# Patient Record
Sex: Female | Born: 1965 | Race: White | Hispanic: No | Marital: Married | Smoking: Never smoker
Health system: Southern US, Community
[De-identification: ages and names within clinical notes are randomized; demographics above are authoritative.]

## PROBLEM LIST (undated history)

## (undated) DIAGNOSIS — D649 Anemia, unspecified: Secondary | ICD-10-CM

## (undated) DIAGNOSIS — Z8719 Personal history of other diseases of the digestive system: Secondary | ICD-10-CM

## (undated) DIAGNOSIS — E119 Type 2 diabetes mellitus without complications: Secondary | ICD-10-CM

## (undated) DIAGNOSIS — K5790 Diverticulosis of intestine, part unspecified, without perforation or abscess without bleeding: Secondary | ICD-10-CM

## (undated) DIAGNOSIS — K5900 Constipation, unspecified: Secondary | ICD-10-CM

## (undated) DIAGNOSIS — K589 Irritable bowel syndrome without diarrhea: Secondary | ICD-10-CM

## (undated) DIAGNOSIS — T7840XA Allergy, unspecified, initial encounter: Secondary | ICD-10-CM

## (undated) DIAGNOSIS — M2550 Pain in unspecified joint: Secondary | ICD-10-CM

## (undated) DIAGNOSIS — R7303 Prediabetes: Secondary | ICD-10-CM

## (undated) DIAGNOSIS — E785 Hyperlipidemia, unspecified: Secondary | ICD-10-CM

## (undated) DIAGNOSIS — F419 Anxiety disorder, unspecified: Secondary | ICD-10-CM

## (undated) DIAGNOSIS — G4730 Sleep apnea, unspecified: Secondary | ICD-10-CM

## (undated) DIAGNOSIS — K5730 Diverticulosis of large intestine without perforation or abscess without bleeding: Secondary | ICD-10-CM

## (undated) DIAGNOSIS — F329 Major depressive disorder, single episode, unspecified: Secondary | ICD-10-CM

## (undated) DIAGNOSIS — Z973 Presence of spectacles and contact lenses: Secondary | ICD-10-CM

## (undated) DIAGNOSIS — Z9289 Personal history of other medical treatment: Secondary | ICD-10-CM

## (undated) DIAGNOSIS — R112 Nausea with vomiting, unspecified: Secondary | ICD-10-CM

## (undated) DIAGNOSIS — R12 Heartburn: Secondary | ICD-10-CM

## (undated) DIAGNOSIS — R21 Rash and other nonspecific skin eruption: Secondary | ICD-10-CM

## (undated) DIAGNOSIS — K644 Residual hemorrhoidal skin tags: Secondary | ICD-10-CM

## (undated) DIAGNOSIS — K219 Gastro-esophageal reflux disease without esophagitis: Secondary | ICD-10-CM

## (undated) DIAGNOSIS — R002 Palpitations: Secondary | ICD-10-CM

## (undated) DIAGNOSIS — Z9889 Other specified postprocedural states: Secondary | ICD-10-CM

## (undated) DIAGNOSIS — I10 Essential (primary) hypertension: Secondary | ICD-10-CM

## (undated) DIAGNOSIS — I4891 Unspecified atrial fibrillation: Secondary | ICD-10-CM

## (undated) DIAGNOSIS — E559 Vitamin D deficiency, unspecified: Secondary | ICD-10-CM

## (undated) DIAGNOSIS — G2581 Restless legs syndrome: Secondary | ICD-10-CM

## (undated) DIAGNOSIS — Z8759 Personal history of other complications of pregnancy, childbirth and the puerperium: Secondary | ICD-10-CM

## (undated) DIAGNOSIS — F32A Depression, unspecified: Secondary | ICD-10-CM

## (undated) HISTORY — DX: Anemia, unspecified: D64.9

## (undated) HISTORY — DX: Sleep apnea, unspecified: G47.30

## (undated) HISTORY — DX: Unspecified atrial fibrillation: I48.91

## (undated) HISTORY — DX: Hyperlipidemia, unspecified: E78.5

## (undated) HISTORY — DX: Prediabetes: R73.03

## (undated) HISTORY — PX: SHOULDER SURGERY: SHX246

## (undated) HISTORY — DX: Depression, unspecified: F32.A

## (undated) HISTORY — DX: Allergy, unspecified, initial encounter: T78.40XA

## (undated) HISTORY — DX: Palpitations: R00.2

## (undated) HISTORY — DX: Essential (primary) hypertension: I10

## (undated) HISTORY — DX: Restless legs syndrome: G25.81

## (undated) HISTORY — DX: Major depressive disorder, single episode, unspecified: F32.9

## (undated) HISTORY — DX: Heartburn: R12

## (undated) HISTORY — DX: Anxiety disorder, unspecified: F41.9

## (undated) HISTORY — DX: Gastro-esophageal reflux disease without esophagitis: K21.9

## (undated) HISTORY — DX: Constipation, unspecified: K59.00

## (undated) HISTORY — DX: Type 2 diabetes mellitus without complications: E11.9

## (undated) HISTORY — DX: Vitamin D deficiency, unspecified: E55.9

## (undated) HISTORY — DX: Diverticulosis of intestine, part unspecified, without perforation or abscess without bleeding: K57.90

## (undated) HISTORY — DX: Pain in unspecified joint: M25.50

---

## 1998-01-09 ENCOUNTER — Other Ambulatory Visit: Admission: RE | Admit: 1998-01-09 | Discharge: 1998-01-09 | Payer: Self-pay | Admitting: Obstetrics and Gynecology

## 1998-06-06 ENCOUNTER — Other Ambulatory Visit: Admission: RE | Admit: 1998-06-06 | Discharge: 1998-06-06 | Payer: Self-pay | Admitting: Obstetrics and Gynecology

## 1998-10-10 ENCOUNTER — Ambulatory Visit (HOSPITAL_COMMUNITY): Admission: RE | Admit: 1998-10-10 | Discharge: 1998-10-10 | Payer: Self-pay | Admitting: Obstetrics and Gynecology

## 1999-04-14 ENCOUNTER — Ambulatory Visit (HOSPITAL_COMMUNITY): Admission: RE | Admit: 1999-04-14 | Discharge: 1999-04-14 | Payer: Self-pay | Admitting: Obstetrics and Gynecology

## 1999-10-03 ENCOUNTER — Other Ambulatory Visit: Admission: RE | Admit: 1999-10-03 | Discharge: 1999-10-03 | Payer: Self-pay | Admitting: Obstetrics and Gynecology

## 2000-12-16 ENCOUNTER — Other Ambulatory Visit: Admission: RE | Admit: 2000-12-16 | Discharge: 2000-12-16 | Payer: Self-pay | Admitting: Obstetrics and Gynecology

## 2001-05-22 ENCOUNTER — Encounter (INDEPENDENT_AMBULATORY_CARE_PROVIDER_SITE_OTHER): Payer: Self-pay | Admitting: *Deleted

## 2001-05-22 ENCOUNTER — Encounter: Payer: Self-pay | Admitting: Emergency Medicine

## 2001-05-22 ENCOUNTER — Observation Stay (HOSPITAL_COMMUNITY): Admission: EM | Admit: 2001-05-22 | Discharge: 2001-05-23 | Payer: Self-pay | Admitting: Emergency Medicine

## 2001-05-22 HISTORY — PX: LAPAROSCOPIC CHOLECYSTECTOMY: SUR755

## 2002-03-21 ENCOUNTER — Other Ambulatory Visit: Admission: RE | Admit: 2002-03-21 | Discharge: 2002-03-21 | Payer: Self-pay | Admitting: Obstetrics and Gynecology

## 2003-10-09 ENCOUNTER — Other Ambulatory Visit: Admission: RE | Admit: 2003-10-09 | Discharge: 2003-10-09 | Payer: Self-pay | Admitting: Obstetrics and Gynecology

## 2003-10-16 ENCOUNTER — Encounter: Admission: RE | Admit: 2003-10-16 | Discharge: 2003-10-16 | Payer: Self-pay | Admitting: Obstetrics and Gynecology

## 2004-02-12 ENCOUNTER — Encounter: Admission: RE | Admit: 2004-02-12 | Discharge: 2004-02-12 | Payer: Self-pay | Admitting: Internal Medicine

## 2005-04-28 ENCOUNTER — Other Ambulatory Visit: Admission: RE | Admit: 2005-04-28 | Discharge: 2005-04-28 | Payer: Self-pay | Admitting: Obstetrics and Gynecology

## 2005-08-06 ENCOUNTER — Encounter: Admission: RE | Admit: 2005-08-06 | Discharge: 2005-08-06 | Payer: Self-pay | Admitting: Obstetrics and Gynecology

## 2005-09-23 ENCOUNTER — Encounter: Payer: Self-pay | Admitting: Obstetrics and Gynecology

## 2006-03-08 ENCOUNTER — Ambulatory Visit (HOSPITAL_BASED_OUTPATIENT_CLINIC_OR_DEPARTMENT_OTHER): Admission: RE | Admit: 2006-03-08 | Discharge: 2006-03-08 | Payer: Self-pay | Admitting: Urology

## 2006-03-08 HISTORY — PX: OTHER SURGICAL HISTORY: SHX169

## 2007-01-22 ENCOUNTER — Emergency Department (HOSPITAL_COMMUNITY): Admission: EM | Admit: 2007-01-22 | Discharge: 2007-01-23 | Payer: Self-pay | Admitting: Emergency Medicine

## 2007-02-16 ENCOUNTER — Encounter: Admission: RE | Admit: 2007-02-16 | Discharge: 2007-02-16 | Payer: Self-pay | Admitting: Obstetrics and Gynecology

## 2008-02-18 IMAGING — MG MM SCREEN MAMMOGRAM BILATERAL
4 series · 4 of 4 positions shown · non-contrast
Comparison: none

DG SCREEN MAMMOGRAM BILATERAL
Bilateral CC and MLO view(s) were taken.

DIGITAL SCREENING MAMMOGRAM WITH CAD:
The breast tissue is heterogeneously dense.  No masses or malignant type calcifications are 
identified.  Compared with prior studies.

[R CC]
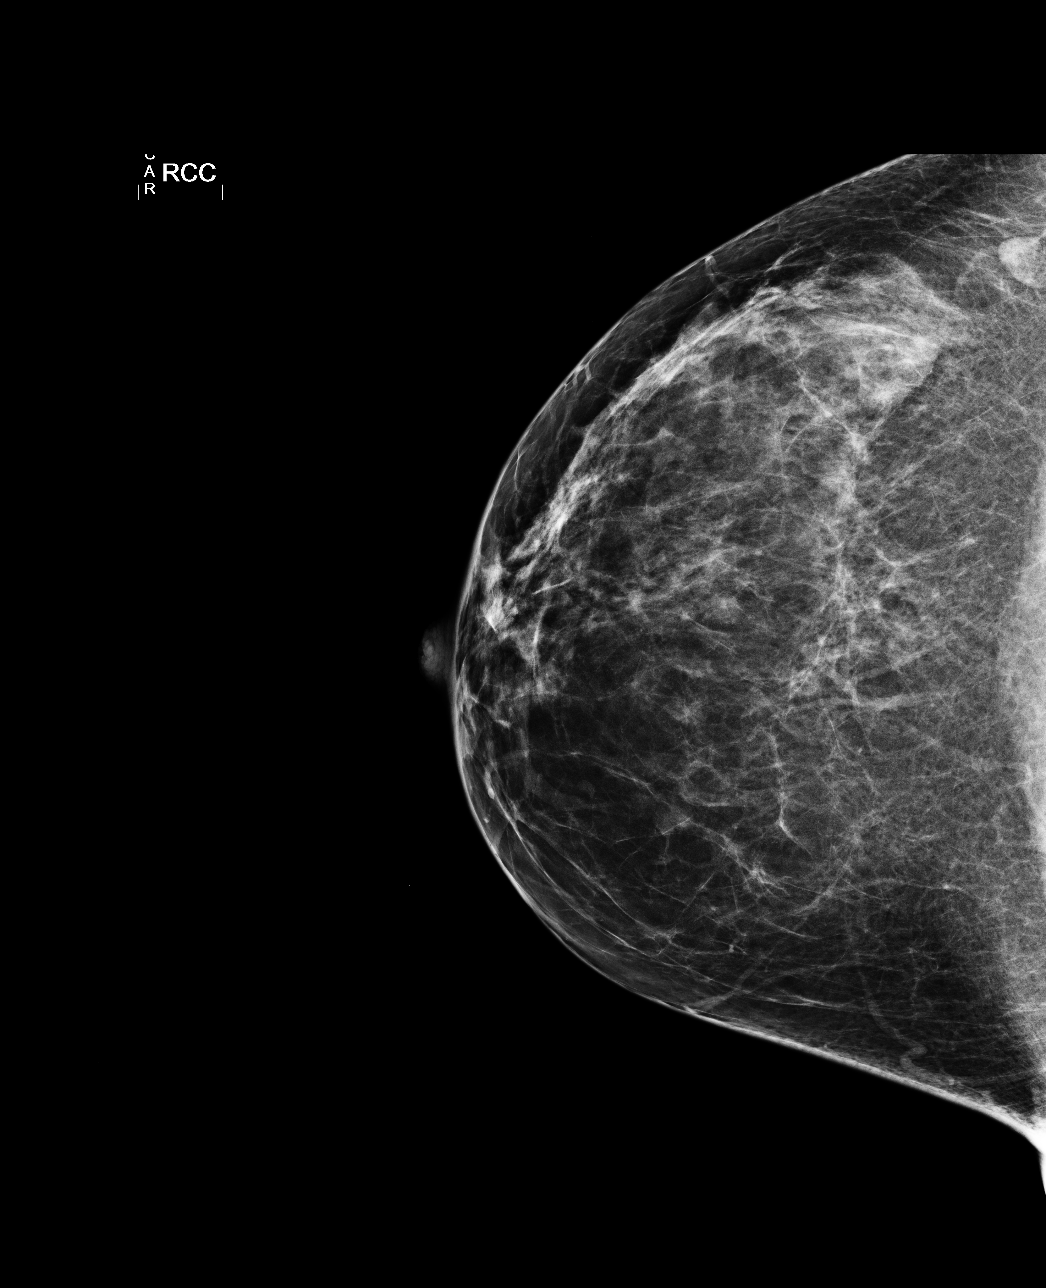

[L CC]
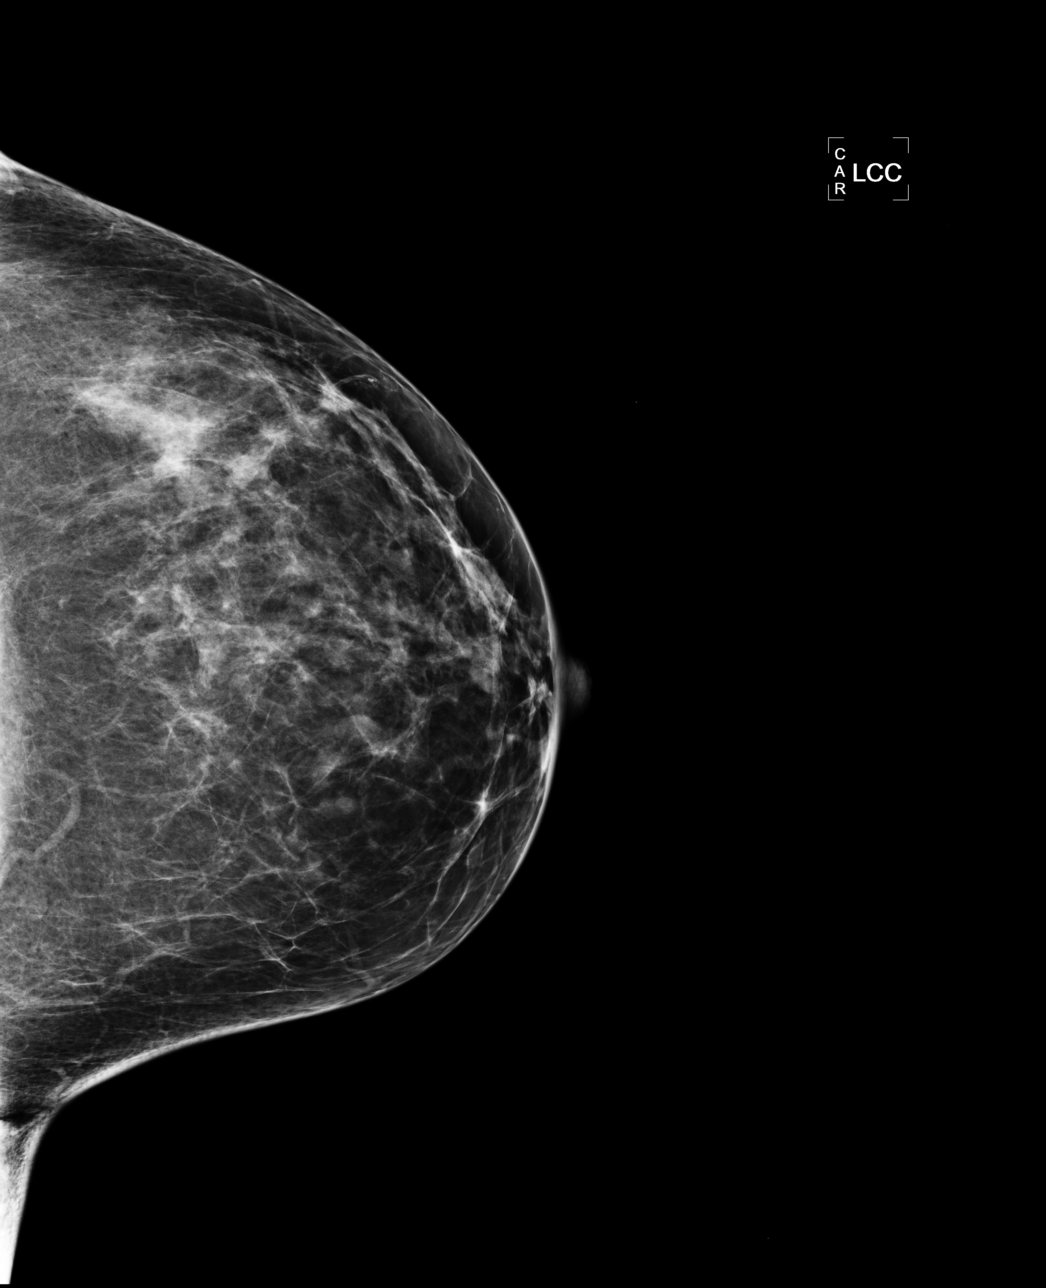

[L MLO]
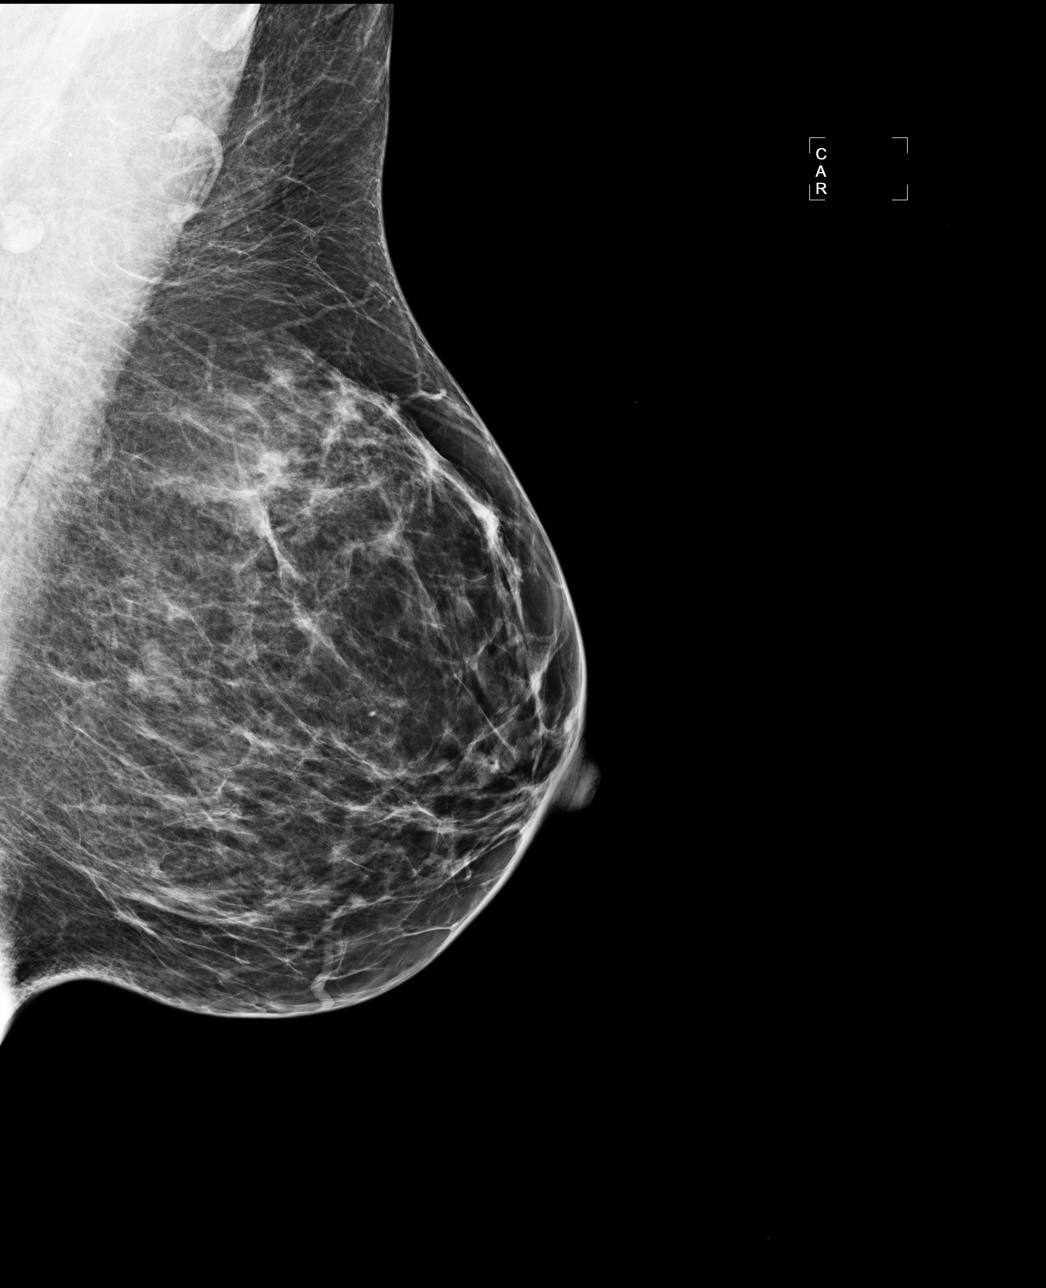

[R MLO]
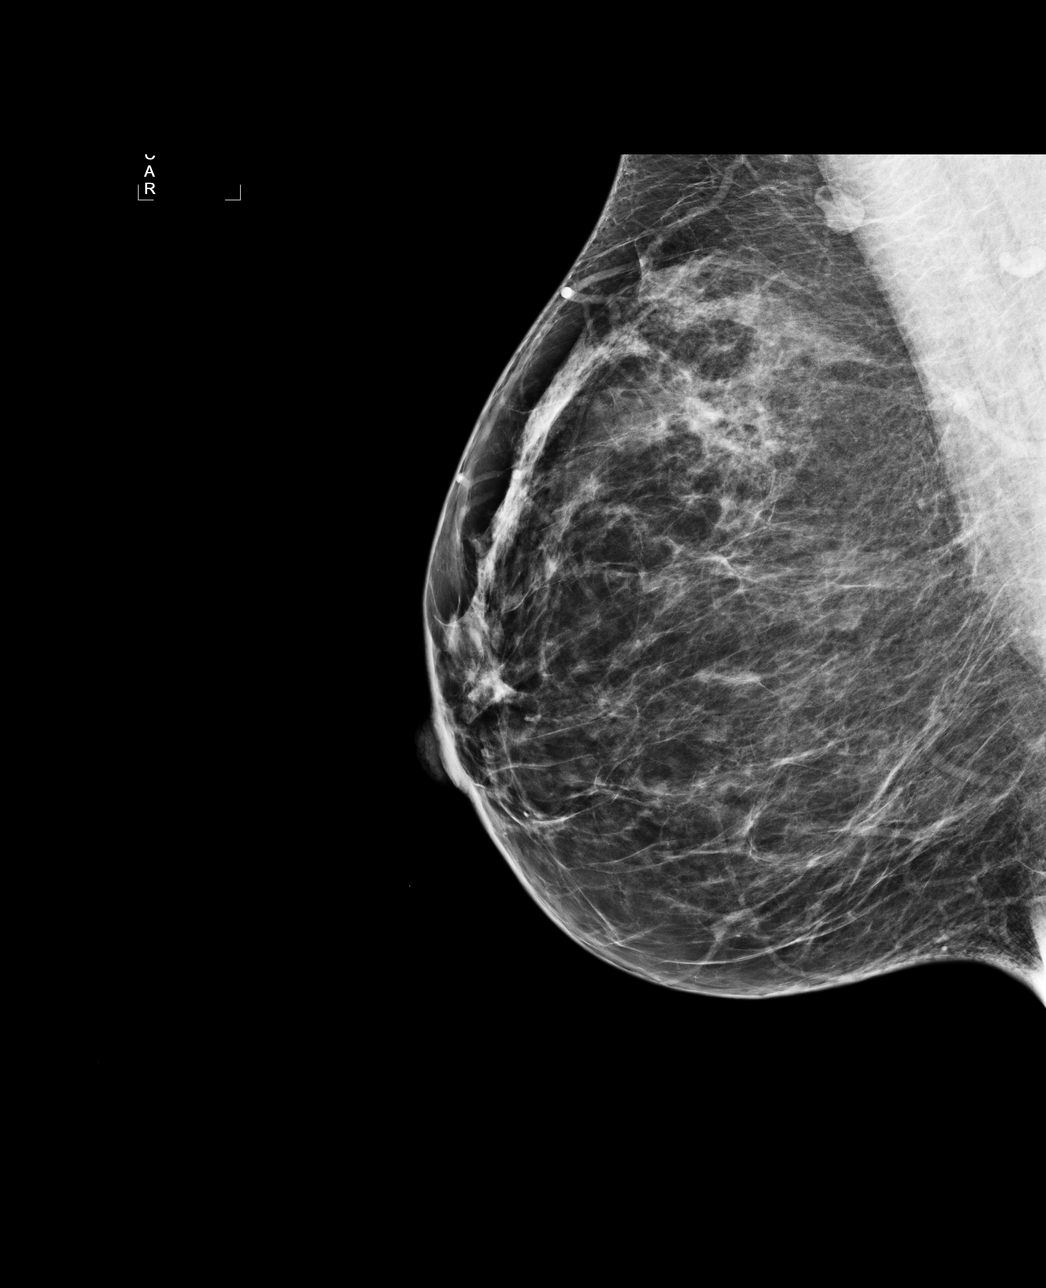

[4 of 4 positions shown; findings below may reference images not displayed]

IMPRESSION: No specific mammographic evidence of malignancy.  Next screening mammogram is recommended in one 
year.

ASSESSMENT: Negative - BI-RADS 1

Screening mammogram in 1 year.
ANALYZED BY COMPUTER AIDED DETECTION. , THIS PROCEDURE WAS A DIGITAL MAMMOGRAM.

## 2010-05-25 HISTORY — PX: BAND HEMORRHOIDECTOMY: SHX1213

## 2010-06-15 ENCOUNTER — Encounter: Payer: Self-pay | Admitting: Obstetrics and Gynecology

## 2010-10-10 NOTE — H&P (Signed)
. [facility]  Patient:    [name] Ross, [name] Ross Visit Number: [id number] MRN: [id number]          Service Type: MED Location: [phone] Attending Physician:  Doug Sou Dictated by:   Jimmye Norman, M.D. Admit Date:  05/22/2001                           History and Physical  CHIEF COMPLAINT:  The patient is an otherwise healthy 45 year old female with acute cholecystitis and cholelithiasis.  HISTORY OF PRESENT ILLNESS:  The patient was in her usual state of good health until yesterday evening approximately 10:00.  This was four hours after she had had her last meal.  She was stricken with severe epigastric and right upper quadrant pain associated with nausea, but no vomiting at that time.  The pain worsened and went more laterally and then to her back associated with more vomiting and nausea at that time.  She had some chills, but no definite fevers at home.  She came into the emergency room where she was evaluated, found to have an elevated white count of 15,000, an ultrasound demonstrating cholelithiasis and acute cholecystitis. The surgical consultation was obtained.  PAST MEDICAL HISTORY:  Significant for hypertension for which she takes Calan SR 120 mg p.o. q.d.  ALLERGIES:  No known drug allergies.  PAST SURGICAL HISTORY:  Cesarean section several years ago.  REVIEW OF SYSTEMS:  She has had no jaundice, no definite fevers.  She has had crampy abdominal pain on and off for many years, but none definitely associated with acute gallbladder.  PHYSICAL EXAMINATION  VITAL SIGNS:  She is afebrile.  Other vital signs are stable.  HEENT:  Normocephalic, atraumatic, anicteric.  NECK:  Supple.  CHEST:  Clear.  CARDIAC:  Regular rhythm and rate.  No murmurs, gallops, rubs, or heaves.  ABDOMEN:  Soft and with hypoactive bowel sounds.  She has right upper quadrant tenderness with no palpable masses, definite guarding in the right  upper quadrant, but no rebound.  PELVIC:  Deferred.  RECTAL:  Deferred.  LABORATORIES:  Normal liver function tests.  White count 15,000 with a left shift, hemoglobin normal.  IMPRESSION:  Acute cholecystitis and cholelithiasis in a 45 year old woman with hypertension.  PLAN:  Take her to the operating room for laparoscopic cholecystectomy after receiving IV antibiotics.  Will give her Unasyn 3 g IV piggyback and then admit her to the hospital postoperatively for recovery. Dictated by:   Jimmye Norman, M.D. Attending Physician:  Doug Sou DD:  05/22/01 TD:  05/22/01 Job: [id number]

## 2010-10-10 NOTE — Op Note (Signed)
[employer]  Patient:    [name] Ross, PELLECCHIA Visit Number: [id number] MRN: [id number]          Service Type: MED Location: [phone] Attending Physician:  Cherylynn Ridges Dictated by:   Jimmye Norman, M.D. Proc. Date: 05/22/01 Admit Date:  05/22/2001                             Operative Report  PREOPERATIVE DIAGNOSES:  Acute cholecystitis and cholelithiasis.  POSTOPERATIVE DIAGNOSES: Hydrops of the gallbladder with obstructive cystic duct, and possible empyema of the gallbladder.  PROCEDURE:  Laparoscopic cholecystectomy.  SURGEON:  Jimmye Norman, M.D.  ASSISTANT:  Kristine Garbe. Ezzard Standing, M.D.  ANESTHESIA:  General endotracheal.  ESTIMATED BLOOD LOSS:  Less than 50-100 cc.  COMPLICATIONS:  None.  CONDITION:  Stable.  INDICATION FOR OPERATION:  The patient is an otherwise healthy 45 year old woman with abdominal pain, severe, with nausea and vomiting and possible chills.  Ultrasound demonstrated a thickened gallbladder wall with an obstructed cystic duct.  She now comes in for a laparoscopic cholecystectomy.  FINDINGS:  The patient had large 4 x 3 stone obstructing the infundibulum and the cystic duct.  She had white bile, with some mild purulent changes.  No cholangiogram was done.  DESCRIPTION OF PROCEDURE:  The patient was taken to the operating room and placed on the table in the supine position.  After an adequate endotracheal anesthetic was administered, she was prepped and draped in the usual sterile manner exposing the midline and the right upper quadrant of the abdomen.  A supraumbilical curvilinear incision was made using a #11 blade. It was taken down to the midline fascia using dissection bluntly with Hemostat clamps.  It was through this midline fascia at the umbilicus site that Veress needle was passed into the peritoneal cavity and confirmed to be in position with the saline test.  We subsequently insufflated the abdomen  with carbon dioxide gas up to a maximum intraabdominal pressure of 15 mmHg.  We then passed an 11-12 mm cannula through the supraumbilical fascia into the peritoneal cavity, and then confirmed its position with the laparoscope and attached camera and light source.  We subsequently passed two right costal margin 5 mm cannulae in the subxiphoid, the 11-12 mm cannula into the peritoneal cavity under direct vision.  After all cannulae had been placed, we placed the patient in reverse Trendelenburg position and the left side was tilted down.  The dissection was begun.  The dome of the gallbladder was tense and difficult to grasp.  We had to decompress it with a laparoscopic needle.  Approximately 20 cc of white bile was removed.  We subsequently grasped the dome of the gallbladder, and then the infundibulum was then grasped.  The peritoneum overlying the Triangle of Calot and the hepatobiliary triangle was dissected out using electrocautery and a dissector. Both the cystic duct and the cystic artery were identified clearly  found to be coursing directly to the gallbladder.  They were clipped proximally and distally and subsequently transected.  The cystic duct and the cystic artery were doubly clipped on the side remaining in the abdomen.  We subsequently dissected the gallbladder out of its bed, making an entrance into the gallbladder up near the dome  however, there was some minimal spillage.  We subsequently removed the gallbladder using an Endocatch or an Endopouch bag  bringing it out through the supraumbilical site.  A  large stone was removed along with it inside the bag.  Once the gallbladder was removed, we irrigated with approximately 3 L of warm saline solution into Morisons pouch and also the right upper quadrant. Once this was done and there was adequate hemostasis in the bed using electrocautery, we removed all cannulae.  The supraumbilical fascia was reapproximated using a  figure-of-eight suture of 0 Vicryl passed on a ______ needle.  We injected all sites with 0.25% Marcaine with epinephrine and approximately 20 cc were used. We then reapproximated the skin using a running subcuticular stitch of 4-0 Vicryl.  A sterile dressing was applied. Dictated by:   Jimmye Norman, M.D. Attending Physician:  Cherylynn Ridges DD:  05/22/01 TD:  05/22/01 Job: [id number]

## 2010-10-10 NOTE — Op Note (Signed)
NAMECARMEL, [name]                 ACCOUNT NO.:  [id number]   MEDICAL RECORD NO.:  [id number]          PATIENT TYPE:  AMB   LOCATION:  [facility]                         FACILITY:  [facility]   PHYSICIAN:  Mark C. Vernie Ammons, M.D.  DATE OF BIRTH:  [birth year]   DATE OF PROCEDURE:  03/08/2006  DATE OF DISCHARGE:                                 OPERATIVE REPORT   PREOPERATIVE DIAGNOSES:  1. Painless microscopic hematuria.  2. Stress urinary incontinence.   POSTOPERATIVE DIAGNOSES:  1. Painless microscopic hematuria.  2. Stress urinary incontinence.   PROCEDURE:  Cystoscopy, bilateral retrograde pyelograms with interpretation  and transobturator sling placement.   SURGEON:  Mark C. Vernie Ammons, M.D.   ANESTHESIA:  General.   DRAINS:  None.   SPECIMENS:  None.   BLOOD LOSS:  Minimal.   COMPLICATIONS:  None.   INDICATIONS:  The patient is a 45 year old white female with pure stress  urinary incontinence by history.  She is found to have grade 1 cystocele and  also has a history of microscopic hematuria.  She was evaluated for this  fully back in 2002.  A recent renal ultrasound revealed normal renal  parenchyma.  She is brought to the OR today for correction of her stress  incontinence with transobturator sling and understands the risks,  complications and alternatives.  In addition to that she will complete her  hematuria workup with cystoscopy and bilateral retrograde pyelograms.   DESCRIPTION OF OPERATION:  After informed consent, the patient brought to  major OR, placed table, administered general anesthesia, then moved the  dorsal lithotomy position.  Genitalia sterilely prepped and draped and 16-  French Foley catheter was placed in the bladder initially.  I then injected  1% lidocaine with epinephrine in the subvaginal mucosa in the midline and  also at the location of the transobturator trocar placement in the inner  thigh which was measured to be 5 cm lateral to the midline on each  side at  the level of the clitoris.  I then made a midline incision in the vaginal  mucosa over the mid urethral region which was noted by palpation of the  Foley catheter placed on mild traction against the bladder neck.  I then  dissected laterally on each side with the Strully scissors on either side of  the urethra to a point where I could place a single index finger through the  incision and next to the urethra.   The transobturator sling trocar was then passed through the skin incision  after the bladder was fully and completely emptied through the Foley  catheter.  I then advanced this through the obturator fossa and then back  behind the symphysis pubis and was then directed this out at the mid  urethral level by digital guidance first on the left-hand side and then  right side.  The sling material was then affixed to the trocar and brought  back through the skin incisions.   The Foley catheter was then removed and a 21-French cystoscope initially  with a 12 degrees lens was inserted in the bladder.  The bladder was noted  to be free of any tumor, stones or inflammatory lesions.  Ureteral orifices  normal configuration and position with clear reflux.  The bladder was also  inspected then with a 70 degrees lens and no additional findings were noted.  There is also no evidence of injury or perforation or foreign body within  the bladder.   Through the cystoscope 6-French open-ended ureteral catheter was then passed  into the right ureteral orifice and full strength contrast was then injected  under direct fluoroscopy.  This revealed a normal ureter throughout its  length with no filling defects, mass effect or other abnormality.  The renal  pelvis collecting system was noted to be entirely normal with no filling  defects or mass effect either.  This was performed first on the right-hand  side and then an identical procedure was performed on the left in identical  fashion with  abnormal findings being noted.  Specifically no filling  defects, mass effect or abnormalities of the ureter, renal pelvis or  collecting system were noted.  This felt to be normal bilateral retrograde  pyelograms.   I then removed the cystoscope and reinserted the Foley catheter.  The sling  was adjusted beneath the mid urethra and forcep was placed beneath the  sling.  I removed first the left-sided the plastic sheathing of the sling  material and then adjusted tension, removed the right side.  In doing this I  noted good mid urethral location of the sling material with very minimal to  no tension.  The excess sling material was excised at the skin level and the  skin incisions were irrigated with antibiotic solution.  I then irrigated  the vaginal incision with antibiotic solution and closed the skin incision  with Dermabond.  The vaginal mucosa was then reapproximated the midline with  running 2-0 Vicryl suture the catheter was removed.  The patient was  awakened and taken to recovery in stable satisfactory condition.  She  tolerated the procedure well with no intraoperative complications.  She will  be given a prescription for Vicodin HP #36 and Cipro 500 mg b.i.d. #6 and  follow-up my office in 1 week.  She will be observed in the recovery room  until she voids to be sure a catheter was not needed.      Mark C. Vernie Ammons, M.D.  Electronically Signed     MCO/MEDQ  D:  03/08/2006  T:  03/09/2006  Job:  [id number]

## 2011-03-06 LAB — DIFFERENTIAL
Basophils Absolute: 0.1
Basophils Relative: 1
Eosinophils Absolute: 0.3
Eosinophils Relative: 3
Lymphocytes Relative: 28
Lymphs Abs: 2.9
Monocytes Absolute: 0.6
Monocytes Relative: 5
Neutro Abs: 6.6
Neutrophils Relative %: 63

## 2011-03-06 LAB — I-STAT 8, (EC8 V) (CONVERTED LAB)
Acid-base deficit: 2
BUN: 14
Bicarbonate: 22.4
Chloride: 107
Glucose, Bld: 111 — ABNORMAL HIGH
HCT: 42
Hemoglobin: 14.3
Operator id: 151321
Potassium: 3.7
Sodium: 138
TCO2: 23
pCO2, Ven: 35.7 — ABNORMAL LOW
pH, Ven: 7.406 — ABNORMAL HIGH

## 2011-03-06 LAB — HEPATIC FUNCTION PANEL
ALT: 13
AST: 15
Albumin: 3.5
Alkaline Phosphatase: 46
Bilirubin, Direct: <0.1
Total Bilirubin: 0.4
Total Protein: 6.4

## 2011-03-06 LAB — CBC
HCT: 39.3
Hemoglobin: 13.4
MCHC: 34
MCV: 92.3
Platelets: 336
RBC: 4.26
RDW: 12.7
WBC: 10.6 — ABNORMAL HIGH

## 2011-03-06 LAB — POCT PREGNANCY, URINE
Operator id: 28425
Preg Test, Ur: NEGATIVE

## 2011-03-06 LAB — POCT CARDIAC MARKERS
CKMB, poc: <1 — ABNORMAL LOW
Myoglobin, poc: 37.8
Operator id: 284251
Troponin i, poc: <0.05

## 2011-03-06 LAB — D-DIMER, QUANTITATIVE: D-Dimer, Quant: <0.22

## 2011-03-06 LAB — POCT I-STAT CREATININE
Creatinine, Ser: 0.7
Operator id: 151321

## 2011-03-06 LAB — LIPASE, BLOOD: Lipase: 19

## 2011-04-10 ENCOUNTER — Other Ambulatory Visit: Payer: Self-pay | Admitting: Gastroenterology

## 2011-06-05 ENCOUNTER — Encounter: Payer: Self-pay | Admitting: Gastroenterology

## 2011-06-15 ENCOUNTER — Encounter: Payer: Self-pay | Admitting: Gastroenterology

## 2011-06-15 ENCOUNTER — Ambulatory Visit (INDEPENDENT_AMBULATORY_CARE_PROVIDER_SITE_OTHER): Payer: BC Managed Care – PPO | Admitting: Gastroenterology

## 2011-06-15 DIAGNOSIS — K644 Residual hemorrhoidal skin tags: Secondary | ICD-10-CM | POA: Insufficient documentation

## 2011-06-15 DIAGNOSIS — K5900 Constipation, unspecified: Secondary | ICD-10-CM | POA: Insufficient documentation

## 2011-06-15 DIAGNOSIS — G4730 Sleep apnea, unspecified: Secondary | ICD-10-CM

## 2011-06-15 DIAGNOSIS — K648 Other hemorrhoids: Secondary | ICD-10-CM | POA: Insufficient documentation

## 2011-06-15 DIAGNOSIS — G4733 Obstructive sleep apnea (adult) (pediatric): Secondary | ICD-10-CM | POA: Insufficient documentation

## 2011-06-15 MED ORDER — PEG-KCL-NACL-NASULF-NA ASC-C 100 G PO SOLR: 1.0000 | Freq: Once | ORAL | Status: DC

## 2011-06-15 NOTE — Progress Notes (Signed)
History of Present Illness:  [name] Ross is a pleasant 46 year old white female referred at the request of Dr. Jarold Motto for evaluation of constipation. This has been a problem for years. She may go several days without a bowel movement. With constipation she may have crampy lower bowel pain. Since starting Linzess bowel movements have become more frequent and abdominal pain has subsided. She's had occasional rectal bleeding for which she underwent band ligation several months ago. Bleeding has subsided. She still has external hemorrhoids. She denies rectal pain.    Past Medical History  Diagnosis Date  . Hemorrhoids   . Restless leg syndrome   . Hypercholesterolemia   . Hypertension   . Sleep apnea     cpap sometimes   Past Surgical History  Procedure Date  . Cholecystectomy   . Band hemorrhoidectomy 2012    Dr Kinnie Scales  . Cesarean section    family history includes Breast cancer in an unspecified family member; Cancer in her mother; Diabetes in her father, mother, and sisters; and Skin cancer in her father. Current Outpatient Prescriptions  Medication Sig Dispense Refill  . buPROPion (WELLBUTRIN XL) 150 MG 24 hr tablet Take 150 mg by mouth daily.      Marland Kitchen escitalopram (LEXAPRO) 10 MG tablet Take 10 mg by mouth daily.      . FENOFIBRATE PO Take 1 capsule by mouth daily.      . ferrous sulfate 325 (65 FE) MG tablet Take 325 mg by mouth daily with breakfast.      . Linaclotide (LINZESS) 145 MCG CAPS Take 1 capsule by mouth daily.      . Losartan Potassium (COZAAR PO) Take 1 capsule by mouth daily.      . norethindrone (MICRONOR,CAMILA,ERRIN) 0.35 MG tablet Take 1 tablet by mouth daily.      Marland Kitchen triamterene-hydrochlorothiazide (MAXZIDE-25) 37.5-25 MG per tablet Take 0.5 tablets by mouth daily.      Marland Kitchen VERAPAMIL HCL PO Take 1.5 capsules by mouth daily.        Allergies as of 06/15/2011  . (No Known Allergies)    does not have a smoking history on file. She has never used smokeless tobacco.  She reports that she does not drink alcohol or use illicit drugs.     Review of Systems: Pertinent positive and negative review of systems were noted in the above HPI section. All other review of systems were otherwise negative.  Vital signs were reviewed in today's medical record Physical Exam: General: Well developed , well nourished, no acute distress Head: Normocephalic and atraumatic Eyes:  sclerae anicteric, EOMI Ears: Normal auditory acuity Mouth: No deformity or lesions Neck: Supple, no masses or thyromegaly Lungs: Clear throughout to auscultation Heart: Regular rate and rhythm; no murmurs, rubs or bruits Abdomen: Soft, non tender and non distended. No masses, hepatosplenomegaly or hernias noted. Normal Bowel sounds Rectal: External hemorrhoids are present Musculoskeletal: Symmetrical with no gross deformities  Skin: No lesions on visible extremities Pulses:  Normal pulses noted Extremities: No clubbing, cyanosis, edema or deformities noted Neurological: Alert oriented x 4, grossly nonfocal Cervical Nodes:  No significant cervical adenopathy Inguinal Nodes: No significant inguinal adenopathy Psychological:  Alert and cooperative. Normal mood and affect

## 2011-06-15 NOTE — Patient Instructions (Signed)
Your Colonoscopy is scheduled on 06/18/2011 at 2:30pm Separate instructions have been given We are sending in your Moviprep to your pharmacy today Please HOLD your IRON starting today

## 2011-06-15 NOTE — Assessment & Plan Note (Addendum)
She likely has functional constipation. A structural abnormality of the colon should be ruled out, especially in view of her history of limited rectal bleeding.  Recommendations #1 colonoscopy

## 2011-06-15 NOTE — Assessment & Plan Note (Addendum)
She is minimally symptomatically from hemorrhoids. At this point she would require a surgical hemorrhoidectomy for persistent external hemorrhoids

## 2011-06-18 ENCOUNTER — Ambulatory Visit (AMBULATORY_SURGERY_CENTER): Payer: BC Managed Care – PPO | Admitting: Gastroenterology

## 2011-06-18 ENCOUNTER — Encounter: Payer: Self-pay | Admitting: Gastroenterology

## 2011-06-18 DIAGNOSIS — K5730 Diverticulosis of large intestine without perforation or abscess without bleeding: Secondary | ICD-10-CM

## 2011-06-18 DIAGNOSIS — K5900 Constipation, unspecified: Secondary | ICD-10-CM

## 2011-06-18 DIAGNOSIS — K644 Residual hemorrhoidal skin tags: Secondary | ICD-10-CM

## 2011-06-18 DIAGNOSIS — K648 Other hemorrhoids: Secondary | ICD-10-CM

## 2011-06-18 HISTORY — PX: COLONOSCOPY: SHX174

## 2011-06-18 MED ORDER — SODIUM CHLORIDE 0.9 % IV SOLN: 500.0000 mL | INTRAVENOUS | Status: DC

## 2011-06-18 NOTE — Progress Notes (Signed)
Patient did not have preoperative order for IV antibiotic SSI prophylaxis. (G8918)  Patient did not experience any of the following events: a burn prior to discharge; a fall within the facility; wrong site/side/patient/procedure/implant event; or a hospital transfer or hospital admission upon discharge from the facility. (G8907)  

## 2011-06-18 NOTE — Patient Instructions (Signed)
  Please read the handouts given to you by your recovery room nurse.   Resume your routine medications today.   If you have any questions today, please call us at [phone].  Thank-you.

## 2011-06-18 NOTE — Op Note (Signed)
[facility] [address] [place], [place]  [address]  COLONOSCOPY PROCEDURE REPORT  PATIENT:  [name], Ross  MR#:  [id number] BIRTHDATE:  1965/09/06, 45 yrs. old  GENDER:  female ENDOSCOPIST:  Barbette Hair. Arlyce Dice, MD REF. BY:  Jarome Matin, M.D. PROCEDURE DATE:  06/18/2011 PROCEDURE:  Diagnostic Colonoscopy ASA CLASS:  Class II INDICATIONS:  constipation MEDICATIONS:   MAC sedation, administered by CRNA propofol 300mg IV  DESCRIPTION OF PROCEDURE:   After the risks benefits and alternatives of the procedure were thoroughly explained, informed consent was obtained.  Digital rectal exam was performed and revealed no abnormalities.   The [id number] endoscope was introduced through the anus and advanced to the cecum, which was identified by both the appendix and ileocecal valve, without limitations.  The quality of the prep was good, using MoviPrep. The instrument was then slowly withdrawn as the colon was fully examined. <<PROCEDUREIMAGES>>  FINDINGS:  Moderate diverticulosis was found in the sigmoid colon (see image5).  This was otherwise a normal examination of the colon (see image3 and image7).   Retroflexed views in the rectum revealed no abnormalities.    The time to cecum =  1) 1.75 minutes. The scope was then withdrawn in  1) 7.50  minutes from the cecum and the procedure completed. COMPLICATIONS:  None ENDOSCOPIC IMPRESSION: 1) Moderate diverticulosis in the sigmoid colon 2) Otherwise normal examination RECOMMENDATIONS: 1) Continue current medications - Linzess 2) Call office for follow-up appointment in 8 weeks REPEAT EXAM:  In 10 year(s) for Colonoscopy.  ______________________________ Barbette Hair. Arlyce Dice, MD  CC:  n. eSIGNED:   Barbette Hair. Kaplan at 06/18/2011 03:23 PM  Jimmye Norman, [phone]

## 2011-06-19 ENCOUNTER — Telehealth: Payer: Self-pay

## 2011-06-19 NOTE — Telephone Encounter (Signed)
Left message

## 2011-08-11 ENCOUNTER — Ambulatory Visit: Payer: BC Managed Care – PPO | Admitting: Gastroenterology

## 2013-12-04 ENCOUNTER — Ambulatory Visit (INDEPENDENT_AMBULATORY_CARE_PROVIDER_SITE_OTHER): Payer: BC Managed Care – PPO | Admitting: Surgery

## 2013-12-25 ENCOUNTER — Ambulatory Visit (INDEPENDENT_AMBULATORY_CARE_PROVIDER_SITE_OTHER): Payer: BC Managed Care – PPO | Admitting: Surgery

## 2014-06-05 ENCOUNTER — Other Ambulatory Visit: Payer: Self-pay | Admitting: Obstetrics and Gynecology

## 2014-06-06 LAB — CYTOLOGY - PAP

## 2014-07-24 ENCOUNTER — Other Ambulatory Visit: Payer: Self-pay | Admitting: Gastroenterology

## 2015-02-12 ENCOUNTER — Encounter: Payer: Self-pay | Admitting: Neurology

## 2015-02-12 ENCOUNTER — Ambulatory Visit (INDEPENDENT_AMBULATORY_CARE_PROVIDER_SITE_OTHER): Payer: BLUE CROSS/BLUE SHIELD | Admitting: Neurology

## 2015-02-12 VITALS — BP 136/82 | HR 80 | Resp 16 | Ht 66.5 in | Wt 217.0 lb

## 2015-02-12 DIAGNOSIS — R635 Abnormal weight gain: Secondary | ICD-10-CM | POA: Diagnosis not present

## 2015-02-12 DIAGNOSIS — G4733 Obstructive sleep apnea (adult) (pediatric): Secondary | ICD-10-CM | POA: Diagnosis not present

## 2015-02-12 DIAGNOSIS — G4761 Periodic limb movement disorder: Secondary | ICD-10-CM | POA: Diagnosis not present

## 2015-02-12 DIAGNOSIS — G4719 Other hypersomnia: Secondary | ICD-10-CM | POA: Diagnosis not present

## 2015-02-12 DIAGNOSIS — G2581 Restless legs syndrome: Secondary | ICD-10-CM | POA: Diagnosis not present

## 2015-02-12 DIAGNOSIS — E669 Obesity, unspecified: Secondary | ICD-10-CM | POA: Diagnosis not present

## 2015-02-12 NOTE — Patient Instructions (Signed)
Based on your symptoms and your exam, you have obstructive sleep apnea (OSA), and we should proceed with a sleep study to determine how severe it is. If you have more than mild OSA, I want you to consider treatment with CPAP. Please remember, the risks and ramifications of moderate to severe obstructive sleep apnea or OSA are: Cardiovascular disease, including congestive heart failure, stroke, difficult to control hypertension, arrhythmias, and even type 2 diabetes has been linked to untreated OSA. Sleep apnea causes disruption of sleep and sleep deprivation in most cases, which, in turn, can cause recurrent headaches, problems with memory, mood, concentration, focus, and vigilance. Most people with untreated sleep apnea report excessive daytime sleepiness, which can affect their ability to drive. Please do not drive if you feel sleepy.   I will likely see you back after your sleep study to go over the test results and where to go from there. We will call you after your sleep study to advise about the results (most likely, you will hear from Beverlee Nims, my nurse) and to set up an appointment at the time, as necessary.    Our sleep lab administrative assistant, Arrie Aran will meet with you or call you to schedule your sleep study. If you don't hear back from her by next week please feel free to call her at [phone]. This is her direct line and please leave a message with your phone number to call back if you get the voicemail box. She will call back as soon as possible.

## 2015-02-12 NOTE — Progress Notes (Signed)
Subjective:    Patient ID: [name] Ross is a 49 y.o. female.  HPI     [name], MD, PhD [facility] [address] [place], [place] [address]  Dear Dr. Philip Aspen,   I saw your patient, [name] Ross, upon your kind request in my neurologic clinic today for initial consultation of her sleep disorder, in particular, concern for underlying obstructive sleep apnea and reevaluation thereof. The patient is unaccompanied today. As you know, [name] Ross is a 49 year old right-handed woman with an underlying medical history of vertigo, hyperlipidemia, hypertension, rhinitis, depression, obesity, restless leg syndrome and PLMS, who had a sleep study in 2010 which showed mild to moderate obstructive sleep apnea and she was placed on CPAP treatment. She uses CPAP for a few months but stopped using it. She found it hard to use and found it difficult to tolerate. She stopped using it for about 4 years but recently tried it a few more times, most recently some 3 weeks ago. I reviewed her prior sleep study results from 11/12/2008 which was a diagnostic polysomnogram. Sleep efficiency was 68.7%. Sleep latency was 17.5 minutes. She had an increased percentage of stage II sleep, a mildly decreased percentage of slow-wave sleep, an increased percentage of stage I sleep and near absence of REM sleep. Total AHI was 13.9 per hour, rising to 16.6 per hour during supine sleep. Lowest oxygen saturation was 87%. She was invited back for sleep study with CPAP. She had this on 12/06/2008: Sleep efficiency was 96.3%, sleep latency was quick at 1.5 minutes. REM percentage was 8.4%, she had an increased percentage of slow-wave sleep, she was treated with CPAP from 4 cm to 11 cm. On the final pressure her AHI was 1.8 per hour. She was prescribed CPAP therapy at 11 cm. I reviewed a CPAP compliance download from 02/09/2009 through 03/08/2009 which is a total of 28 days during which time she used  her machine 21 days with percent used days greater than 4 hours at 46%, indicating suboptimal compliance, residual AHI was 0.9, leak was low, set pressure at 11 cm. She has some restless leg symptoms. 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 At the time of her previous study she weighed 198 pounds. She has gained weight, particularly started gaining weight several years ago after her father committed suicide in 2008. She lost her mother last year in November. She is on Lexapro generic for her depression which is working fairly well for her. She is also on Wellbutrin XL 300 mg daily. She had tried weight loss medication in the past but she had side effects including palpitations. She did lose some weight on the medication. She would like to be able to use something else that does not cause her side effects. She has no overt family history of obstructive sleep apnea. Her husband has been diagnosed with sleep apnea and uses a CPAP machine and has pushed her to be reevaluated.  Her Epworth sleepiness score is 12 out of 24 today, her fatigue sco she is increasing her water intake. She is a nonsmoker. She drinks alcohol very rarely.  I reviewed your office note from 01/04/2015 which you kindly included.  Her Past Medical History Is Significant For: Past Medical History  Diagnosis Date  . Hemorrhoids   . Restless leg syndrome   . Hypercholesterolemia   . Hypertension   . Sleep apnea     cpap sometimes  . Depression   .  GERD (gastroesophageal reflux disease)   . Microhematuria   . Galactorrhea   . Vertigo   . Dyslipidemia     Her Past Surgical History Is Significant For: Past Surgical History  Procedure Laterality Date  . Cholecystectomy    . Band hemorrhoidectomy  2012    Dr Earlean Shawl  . Cesarean section    . Bladder suspension      Her Family History Is Significant For: Family History  Problem Relation Age of Onset  . Cancer Mother     Carcnoid tumors  . Diabetes Mother   . Hypertension  Mother   . Atrial fibrillation Mother   . Diabetes Father   . Skin cancer Father   . Suicidality Father   . Hypertension Father   . Diabetes Sister   . Diabetes Sister   . Breast cancer      materanl great aunt    Her Social History Is Significant For: Social History   Social History  . Marital Status: Married    Spouse Name: [name]  . Number of Children: 2  . Years of Education: RN   Occupational History  . RN    Social History Main Topics  . Smoking status: Never Smoker   . Smokeless tobacco: Never Used  . Alcohol Use: 0.0 oz/week    0 Standard drinks or equivalent per week     Comment: Rare  . Drug Use: No  . Sexual Activity: Not Asked   Other Topics Concern  . None   Social History Narrative   Occasionally drinks coffee    Her Allergies Are:  No Known Allergies:   Her Current Medications Are:  Outpatient Encounter Prescriptions as of 02/12/2015  Medication Sig  . aspirin 81 MG EC tablet Take 81 mg by mouth daily. Swallow whole.  Marland Kitchen buPROPion (WELLBUTRIN XL) 300 MG 24 hr tablet Take 300 mg by mouth daily.  . carvedilol (COREG) 3.125 MG tablet Take 3.125 mg by mouth 2 (two) times daily with a meal.  . escitalopram (LEXAPRO) 20 MG tablet Take 20 mg by mouth daily.  . fenofibrate micronized (LOFIBRA) 200 MG capsule   . hyoscyamine (LEVSIN SL) 0.125 MG SL tablet   . losartan (COZAAR) 100 MG tablet   . norethindrone (MICRONOR,CAMILA,ERRIN) 0.35 MG tablet Take 1 tablet by mouth daily.  . pantoprazole (PROTONIX) 40 MG tablet Take 40 mg by mouth daily.  Marland Kitchen triamterene-hydrochlorothiazide (MAXZIDE-25) 37.5-25 MG per tablet Take 0.5 tablets by mouth daily.  . valACYclovir (VALTREX) 1000 MG tablet Take 1,000 mg by mouth 2 (two) times daily.  . verapamil (CALAN-SR) 240 MG CR tablet   . [DISCONTINUED] ferrous sulfate 325 (65 FE) MG tablet Take 325 mg by mouth daily with breakfast.  . [DISCONTINUED] Losartan Potassium (COZAAR PO) Take 1 capsule by mouth daily.  .  [DISCONTINUED] meloxicam (MOBIC) 15 MG tablet Take 15 mg by mouth daily.  . [DISCONTINUED] methocarbamol (ROBAXIN) 500 MG tablet Take 500 mg by mouth 4 (four) times daily.  . [DISCONTINUED] propranolol (INDERAL) 10 MG tablet Take 10 mg by mouth 3 (three) times daily.  . [DISCONTINUED] VERAPAMIL HCL PO Take 1.5 capsules by mouth daily.    No facility-administered encounter medications on file as of 02/12/2015.  :  Review of Systems:  Out of a complete 14 point review of systems, all are reviewed and negative with the exception of these symptoms as listed below:  Review of Systems  Constitutional: Positive for fatigue.  Endocrine:  Feeling hot, flushing   Neurological: Positive for headaches.       Patient was put on CPAP about 2010. She reports using it on and off 6 months at a time. States that he used it last a couple of years ago and recently tried using it again.   Snoring, Benadryl helps her fall asleep, reports tossing and turning all night, wakes up feeling tired in the morning, reports falling asleep if not busy, headaches, takes naps during the day.     Objective:  Neurologic Exam  Physical Exam Physical Examination:   Filed Vitals:   02/12/15 1332  BP: 136/82  Pulse: 80  Resp: 16   General Examination: The patient is a very pleasant 49 y.o. female in no acute distress. She appears well-developed and well-nourished and well groomed.   HEENT: Normocephalic, atraumatic, pupils are equal, round and reactive to light and accommodation. Funduscopic exam is normal with sharp disc margins noted. Extraocular tracking is good without limitation to gaze excursion or nystagmus noted. Normal smooth pursuit is noted. Hearing is grossly intact. Tympanic membranes are clear bilaterally. Face is symmetric with normal facial animation and normal facial sensation. Speech is clear with no dysarthria noted. There is no hypophonia. There is no lip, neck/head, jaw or voice tremor. Neck is  supple with full range of passive and active motion. There are no carotid bruits on auscultation. Oropharynx exam reveals: mild mouth dryness, good dental hygiene and moderate airway crowding, due to narrow airway entry, elongated uvula and thicker tongue. Mallampati is class II. Tongue protrudes centrally and palate elevates symmetrically. Tonsils are either absent or small. Neck circumference is 15-5/8 inches.  Chest: Clear to auscultation without wheezing, rhonchi or crackles noted.  Heart: S1+S2+0, regular and normal without murmurs, rubs or gallops noted.   Abdomen: Soft, non-tender and non-distended with normal bowel sounds appreciated on auscultation.  Extremities: There is no pitting edema in the distal lower extremities bilaterally. Pedal pulses are intact.  Skin: Warm and dry without trophic changes noted. There are no varicose veins.  Musculoskeletal: exam reveals no obvious joint deformities, tenderness or joint swelling or erythema.   Neurologically:  Mental status: The patient is awake, alert and oriented in all 4 spheres. Her immediate and remote memory, attention, language skills and fund of knowledge are appropriate. There is no evidence of aphasia, agnosia, apraxia or anomia. Speech is clear with normal prosody and enunciation. Thought process is linear. Mood is normal and affect is normal.  Cranial nerves II - XII are as described above under HEENT exam. In addition: shoulder shrug is normal with equal shoulder height noted. Motor exam: Normal bulk, strength and tone is noted. There is no drift, tremor or rebound. Romberg is negative. Reflexes are 2+ throughout. Babinski: Toes are flexor bilaterally. Fine motor skills and coordination: intact with normal finger taps, normal hand movements, normal rapid alternating patting, normal foot taps and normal foot agility.  Cerebellar testing: No dysmetria or intention tremor on finger to nose testing. Heel to shin is unremarkable  bilaterally. There is no truncal or gait ataxia.  Sensory exam: intact to light touch, pinprick, vibration, temperature sense in the upper and lower extremities.  Gait, station and balance: She stands easily. No veering to one side is noted. No leaning to one side is noted. Posture is age-appropriate and stance is narrow based. Gait shows normal stride length and normal pace. No problems turning are noted. She turns en bloc. Tandem walk is unremarkable.   Assessment  and Plan:   In summary, DELAINEE TRAMEL is a very pleasant 49 y.o.-year old female with an underlying medical history of vertigo, hyperlipidemia, hypertension, rhinitis, depression, obesity, restless leg syndrome and PLMS, who  was previously diagnosed with obstructive sleep apnea, mild to moderate at the time. She has had interim weight gain of approximately 20 pounds. She reports snoring and nonrestorative sleep as well as excessive daytime somnolence. Her history and physical exam are in keeping with obstructive sleep apnea, with potential worsening since her weight gain.  I had a long chat with the patient about my findings and the diagnosis of OSA, its prognosis and treatment options. We talked about medical treatments, surgical interventions and non-pharmacological approaches. I explained in particular the risks and ramifications of untreated moderate to severe OSA, especially with respect to developing cardiovascular disease down the Road, including congestive heart failure, difficult to treat hypertension, cardiac arrhythmias, or stroke. Even type 2 diabetes has, in part, been linked to untreated OSA. Symptoms of untreated OSA include daytime sleepiness, memory problems, mood irritability and mood disorder such as depression and anxiety, lack of energy, as well as recurrent headaches, especially morning headaches. We talked about trying to maintain a healthy lifestyle in general, as well as the importance of weight control. I encouraged the  patient to eat healthy, exercise daily and keep well hydrated, to keep a scheduled bedtime and wake time routine, to not skip any meals and eat healthy snacks in between meals. I advised the patient not to drive when feeling sleepy. I recommended the following at this time: sleep study with potential positive airway pressure titration. (We will score hypopneas at 3% and split the sleep study into diagnostic and treatment portion, if the estimated. 2 hour AHI is >15/h).   I explained the sleep test procedure to the patient and also outlined possible surgical and non-surgical treatment options of OSA, including the use of a custom-made dental device (which would require a referral to a specialist dentist or oral surgeon), upper airway surgical options, such as pillar implants, radiofrequency surgery, tongue base surgery, and UPPP (which would involve a referral to an ENT surgeon). Rarely, jaw surgery such as mandibular advancement may be considered.  I also explained the CPAP treatment option to the patient, who indicated that she would be willing to try CPAP if the need arises. I explained the importance of being compliant with PAP treatment, not only for insurance purposes but primarily to improve Her symptoms, and for the patient's long term health benefit, including to reduce Her cardiovascular risks. I answered all her questions today and the patient was in agreement. I would like to see her back after the sleep study is completed and encouraged her to call with any interim questions, concerns, problems or updates.   Thank you very much for allowing me to participate in the care of this nice patient. If I can be of any further assistance to you please do not hesitate to call me at [phone].  Sincerely,   [name], MD, PhD

## 2015-03-07 ENCOUNTER — Emergency Department
Admission: EM | Admit: 2015-03-07 | Discharge: 2015-03-08 | Disposition: A | Discharge status: Home / Self Care | Payer: BLUE CROSS/BLUE SHIELD | Attending: Emergency Medicine | Admitting: Emergency Medicine

## 2015-03-07 ENCOUNTER — Emergency Department: Payer: BLUE CROSS/BLUE SHIELD

## 2015-03-07 ENCOUNTER — Encounter: Payer: Self-pay | Admitting: *Deleted

## 2015-03-07 DIAGNOSIS — Z79899 Other long term (current) drug therapy: Secondary | ICD-10-CM | POA: Diagnosis not present

## 2015-03-07 DIAGNOSIS — K5732 Diverticulitis of large intestine without perforation or abscess without bleeding: Secondary | ICD-10-CM | POA: Insufficient documentation

## 2015-03-07 DIAGNOSIS — Z7982 Long term (current) use of aspirin: Secondary | ICD-10-CM | POA: Insufficient documentation

## 2015-03-07 DIAGNOSIS — I10 Essential (primary) hypertension: Secondary | ICD-10-CM | POA: Diagnosis not present

## 2015-03-07 DIAGNOSIS — R1031 Right lower quadrant pain: Secondary | ICD-10-CM | POA: Diagnosis present

## 2015-03-07 LAB — URINALYSIS COMPLETE WITH MICROSCOPIC (ARMC ONLY)
Bacteria, UA: NONE SEEN
Bilirubin Urine: NEGATIVE
Glucose, UA: 50 mg/dL — AB
Ketones, ur: NEGATIVE mg/dL
Leukocytes, UA: NEGATIVE
Nitrite: NEGATIVE
Protein, ur: NEGATIVE mg/dL
Specific Gravity, Urine: 1.013 (ref 1.005–1.030)
pH: 5 (ref 5.0–8.0)

## 2015-03-07 LAB — COMPREHENSIVE METABOLIC PANEL
ALT: 19 U/L (ref 14–54)
AST: 16 U/L (ref 15–41)
Albumin: 4.1 g/dL (ref 3.5–5.0)
Alkaline Phosphatase: 35 U/L — ABNORMAL LOW (ref 38–126)
Anion gap: 9 (ref 5–15)
BUN: 18 mg/dL (ref 6–20)
CO2: 25 mmol/L (ref 22–32)
Calcium: 9.4 mg/dL (ref 8.9–10.3)
Chloride: 102 mmol/L (ref 101–111)
Creatinine, Ser: 0.91 mg/dL (ref 0.44–1.00)
GFR calc Af Amer: >60 mL/min (ref >60)
GFR calc non Af Amer: >60 mL/min (ref >60)
Glucose, Bld: 96 mg/dL (ref 65–99)
Potassium: 3.8 mmol/L (ref 3.5–5.1)
Sodium: 136 mmol/L (ref 135–145)
Total Bilirubin: 0.4 mg/dL (ref 0.3–1.2)
Total Protein: 8 g/dL (ref 6.5–8.1)

## 2015-03-07 LAB — CBC
HCT: 37.7 % (ref 35.0–47.0)
Hemoglobin: 12.6 g/dL (ref 12.0–16.0)
MCH: 30.4 pg (ref 26.0–34.0)
MCHC: 33.5 g/dL (ref 32.0–36.0)
MCV: 90.7 fL (ref 80.0–100.0)
Platelets: 462 10*3/uL — ABNORMAL HIGH (ref 150–440)
RBC: 4.16 MIL/uL (ref 3.80–5.20)
RDW: 13.1 % (ref 11.5–14.5)
WBC: 13.2 10*3/uL — ABNORMAL HIGH (ref 3.6–11.0)

## 2015-03-07 MED ORDER — ONDANSETRON HCL 4 MG PO TABS: 4.0000 mg | ORAL_TABLET | Freq: Every day | ORAL | Status: DC | PRN

## 2015-03-07 MED ORDER — AMOXICILLIN-POT CLAVULANATE 875-125 MG PO TABS: 1.0000 | ORAL_TABLET | Freq: Two times a day (BID) | ORAL | Status: AC

## 2015-03-07 MED ORDER — SODIUM CHLORIDE 0.9 % IV SOLN
3.0000 g | Freq: Once | INTRAVENOUS | Status: AC
  Administered 2015-03-07: 3 g via INTRAVENOUS
  Filled 2015-03-07: qty 3

## 2015-03-07 MED ORDER — IOHEXOL 240 MG/ML SOLN
25.0000 mL | Freq: Once | INTRAMUSCULAR | Status: AC | PRN
  Administered 2015-03-07: 25 mL via ORAL

## 2015-03-07 MED ORDER — OXYCODONE-ACETAMINOPHEN 5-325 MG PO TABS: 1.0000 | ORAL_TABLET | Freq: Four times a day (QID) | ORAL | Status: DC | PRN

## 2015-03-07 MED ORDER — SODIUM CHLORIDE 0.9 % IV BOLUS (SEPSIS)
1000.0000 mL | Freq: Once | INTRAVENOUS | Status: AC
  Administered 2015-03-07: 1000 mL via INTRAVENOUS

## 2015-03-07 MED ORDER — IOHEXOL 300 MG/ML  SOLN
100.0000 mL | Freq: Once | INTRAMUSCULAR | Status: AC | PRN
  Administered 2015-03-07: 100 mL via INTRAVENOUS

## 2015-03-07 NOTE — Discharge Instructions (Signed)
Diverticulitis At any time, diverticulitis can worsen, if you have increased pain, fever, vomiting, or you feel worse in any way please return to the emergency department. Usually, antibiotics work at home but they do not always work so if you feel worse it is vital that you return Diverticulitis is when small pockets that have formed in your colon (large intestine) become infected or swollen. HOME CARE  Follow your doctor's instructions.  Follow a special diet if told by your doctor.  When you feel better, your doctor may tell you to change your diet. You may be told to eat a lot of fiber. Fruits and vegetables are good sources of fiber. Fiber makes it easier to poop (have bowel movements).  Take supplements or probiotics as told by your doctor.  Only take medicines as told by your doctor.  Keep all follow-up visits with your doctor. GET HELP IF:  Your pain does not get better.  You have a hard time eating food.  You are not pooping like normal. GET HELP RIGHT AWAY IF:  Your pain gets worse.  Your problems do not get better.  Your problems suddenly get worse.  You have a fever.  You keep throwing up (vomiting).  You have bloody or black, tarry poop (stool). MAKE SURE YOU:   Understand these instructions.  Will watch your condition.  Will get help right away if you are not doing well or get worse.   This information is not intended to replace advice given to you by your health care provider. Make sure you discuss any questions you have with your health care provider.   Document Released: 10/28/2007 Document Revised: 05/16/2013 Document Reviewed: 04/05/2013 Elsevier Interactive Patient Education [employer].

## 2015-03-07 NOTE — ED Notes (Signed)
POCT URINE negative

## 2015-03-07 NOTE — ED Notes (Signed)
Pt reports lower abdominal cramping and severe nausea since Monday.

## 2015-03-07 NOTE — ED Notes (Signed)
Patient transported to CT 

## 2015-03-07 NOTE — ED Provider Notes (Addendum)
[facility] Emergency Department Provider Note  ____________________________________________   I have reviewed the triage vital signs and the nursing notes.   HISTORY  Chief Complaint Abdominal Pain    HPI [name] Ross is a 49 y.o. female Presents today complaining of lower right-sided abdominal pain, started on Monday, she has had nausea but no vomiting, she has had anorexia but no fever. She has had a history of diverticulitis in the past and this feels different. She does have a history of cholecystectomy years ago. She denies pregnancy. She states that the pain has been constant for 3 days getting worse. She does not wish pain medication at this time. She has had no dysuria or urinary frequency. Did have diarrhea yesterday but was after taking a laxative to see if she could relieve her symptoms. No melena no bright red blood per rectum. The pain is sharp.    Past Medical History  Diagnosis Date  . Hemorrhoids   . Restless leg syndrome   . Hypercholesterolemia   . Hypertension   . Sleep apnea     cpap sometimes  . Depression   . GERD (gastroesophageal reflux disease)   . Microhematuria   . Galactorrhea   . Vertigo   . Dyslipidemia   . Diverticulitis     Patient Active Problem List   Diagnosis Date Noted  . Constipation 06/15/2011  . Internal and external hemorrhoids without complication 81/19/1478  . Sleep apnea 06/15/2011    Past Surgical History  Procedure Laterality Date  . Cholecystectomy    . Band hemorrhoidectomy  2012    Dr Earlean Shawl  . Cesarean section    . Bladder suspension      Current Outpatient Rx  Name  Route  Sig  Dispense  Refill  . aspirin 81 MG EC tablet   Oral   Take 81 mg by mouth daily. Swallow whole.         Marland Kitchen buPROPion (WELLBUTRIN XL) 300 MG 24 hr tablet   Oral   Take 300 mg by mouth daily.         . carvedilol (COREG) 3.125 MG tablet   Oral   Take 3.125 mg by mouth 2 (two) times daily with a meal.          . escitalopram (LEXAPRO) 20 MG tablet   Oral   Take 20 mg by mouth daily.         . fenofibrate micronized (LOFIBRA) 200 MG capsule            4   . hyoscyamine (LEVSIN SL) 0.125 MG SL tablet               . losartan (COZAAR) 100 MG tablet            3   . norethindrone (MICRONOR,CAMILA,ERRIN) 0.35 MG tablet   Oral   Take 1 tablet by mouth daily.         . pantoprazole (PROTONIX) 40 MG tablet   Oral   Take 40 mg by mouth daily.         Marland Kitchen triamterene-hydrochlorothiazide (MAXZIDE-25) 37.5-25 MG per tablet   Oral   Take 0.5 tablets by mouth daily.         . valACYclovir (VALTREX) 1000 MG tablet   Oral   Take 1,000 mg by mouth 2 (two) times daily.         . verapamil (CALAN-SR) 240 MG CR tablet  5     Allergies Review of patient's allergies indicates no known allergies.  Family History  Problem Relation Age of Onset  . Cancer Mother     Carcnoid tumors  . Diabetes Mother   . Hypertension Mother   . Atrial fibrillation Mother   . Diabetes Father   . Skin cancer Father   . Suicidality Father   . Hypertension Father   . Diabetes Sister   . Diabetes Sister   . Breast cancer      materanl great aunt    Social History Social History  Substance Use Topics  . Smoking status: Never Smoker   . Smokeless tobacco: Never Used  . Alcohol Use: 0.0 oz/week    0 Standard drinks or equivalent per week     Comment: Rare    Review of Systems Constitutional: No fever/chills Eyes: No visual changes. ENT: No sore throat. No stiff neck no neck pain Cardiovascular: Denies chest pain. Respiratory: Denies shortness of breath. Gastrointestinal:   See history of present illness Genitourinary: Negative for dysuria. Musculoskeletal: Negative lower extremity swelling Skin: Negative for rash. Neurological: Negative for headaches, focal weakness or numbness. 10-point ROS otherwise  negative.  ____________________________________________   PHYSICAL EXAM:  VITAL SIGNS: ED Triage Vitals  Enc Vitals Group     BP 03/07/15 1907 134/91 mmHg     Pulse Rate 03/07/15 1907 103     Resp 03/07/15 1907 16     Temp 03/07/15 1907 99.1 F (37.3 C)     Temp Source 03/07/15 1907 Oral     SpO2 03/07/15 1907 96 %     Weight 03/07/15 1907 205 lb (92.987 kg)     Height 03/07/15 1907 5\' 6"  (1.676 m)     Head Cir --      Peak Flow --      Pain Score 03/07/15 1908 0     Pain Loc --      Pain Edu? --      Excl. in [place]? --     Constitutional: Alert and oriented. Well appearing and in no acute distress. Eyes: Conjunctivae are normal. PERRL. EOMI. Head: Atraumatic. Nose: No congestion/rhinnorhea. Mouth/Throat: Mucous membranes are moist.  Oropharynx non-erythematous. Neck: No stridor.   Nontender with no meningismus Cardiovascular: Normal rate, regular rhythm. Grossly normal heart sounds.  Good peripheral circulation. Respiratory: Normal respiratory effort.  No retractions. Lungs CTAB. abdominal: Soft there is focal tenderness to right lower quadrant there is voluntary guarding there is no rebound nonsurgical abdomen Back:  There is no focal tenderness or step off there is no midline tenderness there are no lesions noted. there is no CVA tenderness Musculoskeletal: No lower extremity tenderness. No joint effusions, no DVT signs strong distal pulses no edema Neurologic:  Normal speech and language. No gross focal neurologic deficits are appreciated.  Skin:  Skin is warm, dry and intact. No rash noted. Psychiatric: Mood and affect are normal. Speech and behavior are normal.  ____________________________________________   LABS (all labs ordered are listed, but only abnormal results are displayed)  Labs Reviewed  COMPREHENSIVE METABOLIC PANEL - Abnormal; Notable for the following:    Alkaline Phosphatase 35 (*)    All other components within normal limits  CBC - Abnormal;  Notable for the following:    WBC 13.2 (*)    Platelets 462 (*)    All other components within normal limits  URINALYSIS COMPLETEWITH MICROSCOPIC ([facility] ONLY) - Abnormal; Notable for the following:    Color, Urine  YELLOW (*)    APPearance CLEAR (*)    Glucose, UA 50 (*)    Hgb urine dipstick 3+ (*)    Squamous Epithelial / LPF 0-5 (*)    All other components within normal limits  POC URINE PREG, ED   ____________________________________________  EKG   ____________________________________________  RADIOLOGY   ____________________________________________   PROCEDURES  Procedure(s) performed: None  Critical Care performed: None  ____________________________________________   INITIAL IMPRESSION / ASSESSMENT AND PLAN / ED COURSE  Pertinent labs & imaging results that were available during my care of the patient were reviewed by me and considered in my medical decision making (see chart for details).  Patient with focal right lower quadrant pain and anorexia with no fever, white count mildly elevated, certainly could be appendicitis we'll obtain a CT scan diverticulitis is also possible patient is non-surgical this time  ----------------------------------------- 11:43 PM on 03/07/2015 -----------------------------------------  I talked to the patient extensively about her diagnosis, natural course of the disease, the risk of perforation or abscess, we did discuss admission to the hospital versus discharge and patient would very much prefer to go home. We will therefore give her IV antibiotics here, will send her home on Augmentin, and she'll return to the emergency room for any new or worrisome symptoms including worsening pain or fever or any other sign of deterioration of this at this time relatively uncommon located diverticulitis. Serial abdominal exams which are no evidence of acute abdomen. ____________________________________________   FINAL CLINICAL IMPRESSION(S) /  ED DIAGNOSES  Final diagnoses:  None     Schuyler Amor, MD 03/07/15 2228  Schuyler Amor, MD 03/07/15 [id number]

## 2015-03-08 MED ORDER — OXYCODONE-ACETAMINOPHEN 5-325 MG PO TABS
1.0000 | ORAL_TABLET | Freq: Once | ORAL | Status: AC
  Administered 2015-03-08: 1 via ORAL
  Filled 2015-03-08: qty 1

## 2015-04-09 ENCOUNTER — Encounter: Payer: Self-pay | Admitting: General Surgery

## 2015-04-09 NOTE — Progress Notes (Signed)
[name] Ross 04/09/2015 4:20 PM Location: [facility] Patient #: [id number] DOB: January 13, 1966 Married / Language: [name] Ross / Race: White Female  History of Present Illness Odis Hollingshead MD; 04/09/2015 4:55 PM) Patient words: Hemorrhoids.  The patient is a 49 year old female.   Note:She is self-referred and presents because of a painful external hemorrhoid and intermittently bleeding internal hemorrhoids. She has had this problem for many years. She has known functional constipation. Colonoscopy in 2013 demonstrated some sigmoid diverticulosis. No other lesions. She states she has had 3 bouts of diverticulitis as well. She complains of some intermittent severe cramping pain. As for her chronic constipation, Dr. Deatra Ina started her on Linzess which helped transiently. She still has constipation. She does sit a prolonged time on the commode sometimes reading on her cell phone.  Other Problems [name] Ross, [place]; 04/09/2015 4:20 PM) Cholelithiasis Gastroesophageal Reflux Disease Hemorrhoids High blood pressure Hypercholesterolemia Sleep Apnea  Past Surgical History [name] Ross, [place]; 04/09/2015 4:20 PM) Cesarean Section - 1 Gallbladder Surgery - Laparoscopic  Diagnostic Studies History [name] Ross, [facility]; 04/09/2015 4:20 PM) Colonoscopy 1-5 years ago Mammogram 1-3 years ago Pap Smear 1-5 years ago  Allergies [name] Ross, [place]; 04/09/2015 4:28 PM) No Known Drug Allergies 04/09/2015  Medication History [name] Ross, CMA; 04/09/2015 4:28 PM) BuPROPion HCl ER (XL) (300MG  Tablet ER 24HR, Oral daily) Active. Carvedilol (3.125MG  Tablet, Oral daily) Active. Escitalopram Oxalate (20MG  Tablet, Oral daily) Active. Fenofibrate Micronized (200MG  Capsule, Oral daily) Active. Levsin (0.125MG  Tablet, Oral daily) Active. Losartan Potassium (100MG  Tablet, Oral daily) Active. Norethindrone (0.35MG  Tablet, Oral daily) Active. Protonix (40MG  Tablet  DR, Oral daily) Active. ValACYclovir HCl (500MG  Tablet, 2 Oral prn) Active. Verapamil HCl ER (240MG  Capsule ER 24HR, Oral) Active. Medications Reconciled  Social History [name] Ross, [place]; 04/09/2015 4:20 PM) Alcohol use Occasional alcohol use. Caffeine use Coffee. No drug use Tobacco use Never smoker.  Family History [name] Ross, [place]; 04/09/2015 4:20 PM) Cancer Mother. Diabetes Mellitus Father, Mother, Sister. Hypertension Father.  Pregnancy / Birth History [name] Ross, [place]; 04/09/2015 4:20 PM) Contraceptive History Oral contraceptives. Gravida 2 Irregular periods Maternal age 57-30 Para 2     Review of Systems [name] Ross CMA; 04/09/2015 4:20 PM) General Not Present- Appetite Loss, Chills, Fatigue, Fever, Night Sweats, Weight Gain and Weight Loss. Skin Not Present- Change in Wart/Mole, Dryness, Hives, Jaundice, New Lesions, Non-Healing Wounds, Rash and Ulcer. HEENT Not Present- Earache, Hearing Loss, Hoarseness, Nose Bleed, Oral Ulcers, Ringing in the Ears, Seasonal Allergies, Sinus Pain, Sore Throat, Visual Disturbances, Wears glasses/contact lenses and Yellow Eyes. Respiratory Not Present- Bloody sputum, Chronic Cough, Difficulty Breathing, Snoring and Wheezing. Breast Not Present- Breast Mass, Breast Pain, Nipple Discharge and Skin Changes. Cardiovascular Not Present- Chest Pain, Difficulty Breathing Lying Down, Leg Cramps, Palpitations, Rapid Heart Rate, Shortness of Breath and Swelling of Extremities. Gastrointestinal Not Present- Abdominal Pain, Bloating, Bloody Stool, Change in Bowel Habits, Chronic diarrhea, Constipation, Difficulty Swallowing, Excessive gas, Gets full quickly at meals, Hemorrhoids, Indigestion, Nausea, Rectal Pain and Vomiting. Female Genitourinary Not Present- Frequency, Nocturia, Painful Urination, Pelvic Pain and Urgency. Musculoskeletal Not Present- Back Pain, Joint Pain, Joint Stiffness, Muscle Pain, Muscle Weakness and  Swelling of Extremities. Neurological Not Present- Decreased Memory, Fainting, Headaches, Numbness, Seizures, Tingling, Tremor, Trouble walking and Weakness. Psychiatric Not Present- Anxiety, Bipolar, Change in Sleep Pattern, Depression, Fearful and Frequent crying. Endocrine Not Present- Cold Intolerance, Excessive Hunger, Hair Changes, Heat Intolerance, Hot flashes and New Diabetes. Hematology Not Present- Easy Bruising, Excessive bleeding, Gland problems,  HIV and Persistent Infections.  Vitals [name] CMA; 04/09/2015 4:29 PM) 04/09/2015 4:28 PM Weight: 214.4 lb Height: 66in Body Surface Area: 2.06 m Body Mass Index: 34.6 kg/m  Temp.: 98.30F(Oral)  Pulse: 100 (Regular)  BP: 120/90 (Sitting, Left Arm, Standard)      Physical Exam Odis Hollingshead MD; 04/09/2015 4:57 PM)  The physical exam findings are as follows: Note:General-obese female in no acute distress.  HEENT-no facial lesions.  Anorectal-there is an external floppy skin tag right anterior lateral position. Also one in right posterior position. No fissures. On digital rectal exam, she has slightly decreased tone. There are no palpable masses.  Anoscopy was performed and demonstrates moderate sized internal hemorrhoids right anterior position as well as right posterior position    Assessment & Plan Odis Hollingshead MD; 04/09/2015 5:01 PM)  HEMORRHOIDS, INTERNAL, WITH BLEEDING (K64.8) Impression: The bleeding is intermittent and typically occurs when she has to significantly strained to have a bowel movement. Also, given her cramping pain at times, I wonder if she has an element of irritable bowel syndrome.  Plan: Given the fact that her constipation is a stimulus for her hemorrhoidal problem, I would like her to see gastroenterology again to have this addressed. If the constipation will not resolve, there are increased risks of complications from hemorrhoid surgery as well as a significant  increased rate of recurrence. Certainly would want to mitigate these things. Following visit with gastroenterology and control of these issues, I told her I would be glad to see her back again to discuss hemorrhoidectomy.  Current Plans ANOSCOPY, DIAGNOSTIC [id number]) Follow up as needed Pt Education - [facility] Free Text Education/Instructions: discussed with patient and provided information. Referred to Gastroenterology, for evaluation and follow up Physiological scientist). Routine. EXTERNAL HEMORRHOIDS (K64.4) Impression: These are uncomfortable at times and she has difficulty cleaning herself.  Plan: See above.  Jackolyn Confer, MD

## 2015-04-11 ENCOUNTER — Encounter: Payer: Self-pay | Admitting: Gastroenterology

## 2015-06-18 ENCOUNTER — Ambulatory Visit: Payer: BLUE CROSS/BLUE SHIELD | Admitting: Gastroenterology

## 2015-07-22 ENCOUNTER — Encounter: Payer: Self-pay | Admitting: Gastroenterology

## 2015-07-26 ENCOUNTER — Encounter: Payer: Self-pay | Admitting: Urgent Care

## 2015-07-26 ENCOUNTER — Emergency Department
Admission: EM | Admit: 2015-07-26 | Discharge: 2015-07-26 | Disposition: A | Discharge status: Home / Self Care | Payer: BLUE CROSS/BLUE SHIELD | Attending: Emergency Medicine | Admitting: Emergency Medicine

## 2015-07-26 ENCOUNTER — Emergency Department: Payer: BLUE CROSS/BLUE SHIELD

## 2015-07-26 DIAGNOSIS — S4992XA Unspecified injury of left shoulder and upper arm, initial encounter: Secondary | ICD-10-CM | POA: Insufficient documentation

## 2015-07-26 DIAGNOSIS — W19XXXA Unspecified fall, initial encounter: Secondary | ICD-10-CM

## 2015-07-26 DIAGNOSIS — Y9289 Other specified places as the place of occurrence of the external cause: Secondary | ICD-10-CM | POA: Diagnosis not present

## 2015-07-26 DIAGNOSIS — I10 Essential (primary) hypertension: Secondary | ICD-10-CM | POA: Insufficient documentation

## 2015-07-26 DIAGNOSIS — S59902A Unspecified injury of left elbow, initial encounter: Secondary | ICD-10-CM | POA: Diagnosis present

## 2015-07-26 DIAGNOSIS — S53402A Unspecified sprain of left elbow, initial encounter: Secondary | ICD-10-CM | POA: Insufficient documentation

## 2015-07-26 DIAGNOSIS — Y9389 Activity, other specified: Secondary | ICD-10-CM | POA: Insufficient documentation

## 2015-07-26 DIAGNOSIS — Y998 Other external cause status: Secondary | ICD-10-CM | POA: Insufficient documentation

## 2015-07-26 DIAGNOSIS — W108XXA Fall (on) (from) other stairs and steps, initial encounter: Secondary | ICD-10-CM | POA: Diagnosis not present

## 2015-07-26 DIAGNOSIS — Z7982 Long term (current) use of aspirin: Secondary | ICD-10-CM | POA: Insufficient documentation

## 2015-07-26 DIAGNOSIS — M25512 Pain in left shoulder: Secondary | ICD-10-CM

## 2015-07-26 DIAGNOSIS — Z79899 Other long term (current) drug therapy: Secondary | ICD-10-CM | POA: Insufficient documentation

## 2015-07-26 MED ORDER — ONDANSETRON 8 MG PO TBDP
8.0000 mg | ORAL_TABLET | Freq: Once | ORAL | Status: AC
  Administered 2015-07-26: 8 mg via ORAL
  Filled 2015-07-26: qty 1

## 2015-07-26 MED ORDER — OXYCODONE-ACETAMINOPHEN 5-325 MG PO TABS
1.0000 | ORAL_TABLET | Freq: Once | ORAL | Status: AC
Start: 2015-07-26 — End: 2015-07-26
  Administered 2015-07-26: 1 via ORAL
  Filled 2015-07-26: qty 1

## 2015-07-26 MED ORDER — MELOXICAM 15 MG PO TABS: 15.0000 mg | ORAL_TABLET | Freq: Every day | ORAL | Status: DC

## 2015-07-26 NOTE — ED Notes (Signed)
Pt. Taken to xray

## 2015-07-26 NOTE — Discharge Instructions (Signed)

## 2015-07-26 NOTE — ED Provider Notes (Signed)
[name] Emergency Department Provider Note  ____________________________________________  Time seen: Approximately 9:14 PM  I have reviewed the triage vital signs and the nursing notes.   HISTORY  Chief Complaint Shoulder Pain    HPI [name] Ross is a 50 y.o. female who presents emergency department complaining of left shoulder and left elbow pain. Patient states that she missed a step and fell on her outstretched left arm. Patient is now having pain to the left shoulder and left elbow. Patient did not hit her head or lose consciousness at any time. Patient has no history of injury previously to the use joints. Patient is not taking any medications prior to arrival. Patient denies any numbness or tingling in her distal extremity.   Past Medical History  Diagnosis Date  . Hemorrhoids   . Restless leg syndrome   . Hypercholesterolemia   . Hypertension   . Sleep apnea     cpap sometimes  . Depression   . GERD (gastroesophageal reflux disease)   . Microhematuria   . Galactorrhea   . Vertigo   . Dyslipidemia   . Diverticulitis     Patient Active Problem List   Diagnosis Date Noted  . Constipation 06/15/2011  . Internal and external hemorrhoids without complication [id number]  . Sleep apnea 06/15/2011    Past Surgical History  Procedure Laterality Date  . Cholecystectomy    . Band hemorrhoidectomy  2012    Dr [name] Ross  . Cesarean section    . Bladder suspension      Current Outpatient Rx  Name  Route  Sig  Dispense  Refill  . aspirin 81 MG EC tablet   Oral   Take 81 mg by mouth daily. Swallow whole.         Marland Kitchen buPROPion (WELLBUTRIN XL) 300 MG 24 hr tablet   Oral   Take 300 mg by mouth daily.         . carvedilol (COREG) 3.125 MG tablet   Oral   Take 3.125 mg by mouth 2 (two) times daily with a meal.         . escitalopram (LEXAPRO) 20 MG tablet   Oral   Take 20 mg by mouth daily.         . fenofibrate micronized  (LOFIBRA) 200 MG capsule            4   . hyoscyamine (LEVSIN SL) 0.125 MG SL tablet               . losartan (COZAAR) 100 MG tablet            3   . meloxicam (MOBIC) 15 MG tablet   Oral   Take 1 tablet (15 mg total) by mouth daily.   30 tablet   0   . norethindrone (MICRONOR,CAMILA,ERRIN) 0.35 MG tablet   Oral   Take 1 tablet by mouth daily.         . ondansetron (ZOFRAN) 4 MG tablet   Oral   Take 1 tablet (4 mg total) by mouth daily as needed for nausea or vomiting.   10 tablet   0   . oxyCODONE-acetaminophen (ROXICET) 5-325 MG tablet   Oral   Take 1 tablet by mouth every 6 (six) hours as needed.   12 tablet   0   . pantoprazole (PROTONIX) 40 MG tablet   Oral   Take 40 mg by mouth daily.         Marland Kitchen  triamterene-hydrochlorothiazide (MAXZIDE-25) 37.5-25 MG per tablet   Oral   Take 0.5 tablets by mouth daily.         . valACYclovir (VALTREX) 1000 MG tablet   Oral   Take 1,000 mg by mouth 2 (two) times daily.         . verapamil (CALAN-SR) 240 MG CR tablet            5     Allergies Review of patient's allergies indicates no known allergies.  Family History  Problem Relation Age of Onset  . Cancer Mother     Carcnoid tumors  . Diabetes Mother   . Hypertension Mother   . Atrial fibrillation Mother   . Diabetes Father   . Skin cancer Father   . Suicidality Father   . Hypertension Father   . Diabetes Sister   . Diabetes Sister   . Breast cancer      materanl great aunt    Social History Social History  Substance Use Topics  . Smoking status: Never Smoker   . Smokeless tobacco: Never Used  . Alcohol Use: No     Comment: Rare     Review of Systems  Constitutional: No fever/chills. Cardiovascular: no chest pain. Respiratory: no cough. No SOB. Musculoskeletal: Negative for back pain. Patient endorses left elbow and left shoulder pain. Skin: Negative for rash. Neurological: Negative for headaches, focal weakness or  numbness. 10-point ROS otherwise negative.  ____________________________________________   PHYSICAL EXAM:  VITAL SIGNS: ED Triage Vitals  Enc Vitals Group     BP 07/26/15 2053 135/95 mmHg     Pulse Rate 07/26/15 2053 104     Resp 07/26/15 2053 104     Temp 07/26/15 2053 97.9 F (36.6 C)     Temp Source 07/26/15 2053 Oral     SpO2 07/26/15 2053 97 %     Weight 07/26/15 2053 200 lb (90.719 kg)     Height 07/26/15 2053 5\' 6"  (1.676 m)     Head Cir --      Peak Flow --      Pain Score 07/26/15 2054 10     Pain Loc --      Pain Edu? --      Excl. in [place]? --      Constitutional: Alert and oriented. Well appearing and in no acute distress. Eyes: Conjunctivae are normal. PERRL. EOMI. Head: Atraumatic. Neck: No stridor.  No cervical spine tenderness to palpation Cardiovascular: Normal rate, regular rhythm. Normal S1 and S2.  Good peripheral circulation. Respiratory: Normal respiratory effort without tachypnea or retractions. Lungs CTAB. Musculoskeletal: No lower extremity tenderness nor edema.  No joint effusions. No visible deformity to the left shoulder or left elbow on inspection. Limited range of motion of both due to pain. Patient is tender to palpation over the lateral aspect of the left shoulder. No palpable abnormality. Nares test is negative. Examination of the left elbow reveals tenderness to palpation over the posterior and lateral aspect of the elbow. No palpable abnormality. Limited range of motion due to pain. Radial pulses appreciated. Sensation intact 5 digits. Neurologic:  Normal speech and language. No gross focal neurologic deficits are appreciated.  Skin:  Skin is warm, dry and intact. No rash noted. Psychiatric: Mood and affect are normal. Speech and behavior are normal. Patient exhibits appropriate insight and judgement.   ____________________________________________   LABS (all labs ordered are listed, but only abnormal results are displayed)  Labs Reviewed  - No data  to display ____________________________________________  EKG   ____________________________________________  RADIOLOGY [name] [name] Ross, personally viewed and evaluated these images (plain radiographs) as part of my medical decision making, as well as reviewing the written report by the radiologist.  Dg Elbow Complete Left  07/26/2015  CLINICAL DATA:  Status post fall from standing, with left elbow pain. Initial encounter. EXAM: LEFT ELBOW - COMPLETE 3+ VIEW COMPARISON:  None. FINDINGS: There is no evidence of fracture or dislocation. The visualized joint spaces are preserved. No significant joint effusion is identified. The soft tissues are unremarkable in appearance. IMPRESSION: No evidence of fracture or dislocation. Electronically Signed   By: Garald Balding M.D.   On: 07/26/2015 21:55   Dg Shoulder Left  07/26/2015  CLINICAL DATA:  Left shoulder pain after fall on outstretched hand EXAM: LEFT SHOULDER - 2+ VIEW COMPARISON:  None. FINDINGS: No evidence of shoulder dislocation. Proximal humerus is intact. Lucencies with linear orientation in the glenoid likely represent vascular channels but nondisplaced fracture of the glenoid not excluded. IMPRESSION: Linear lucencies in the glenoid most likely represent normal anatomy and vascular channels. No other abnormalities identified. Electronically Signed   By: Skipper Cliche M.D.   On: 07/26/2015 21:15    ____________________________________________    PROCEDURES  Procedure(s) performed:       Medications  oxyCODONE-acetaminophen (PERCOCET/ROXICET) 5-325 MG per tablet 1 tablet (1 tablet Oral Given 07/26/15 2145)  ondansetron (ZOFRAN-ODT) disintegrating tablet 8 mg (8 mg Oral Given 07/26/15 2145)     ____________________________________________   INITIAL IMPRESSION / ASSESSMENT AND PLAN / ED COURSE  Pertinent labs & imaging results that were available during my care of the patient were reviewed by me and considered  in my medical decision making (see chart for details).  Patient's diagnosis is consistent with a fall: Left elbow and left shoulder pain. X-rays are unremarkable. . Patient will be discharged home with prescriptions for anti-inflammatories for symptom control. Patient is given sling for symptom control. Patient is to follow up with orthopedics if symptoms persist past this treatment course. Patient is given ED precautions to return to the ED for any worsening or new symptoms.     ____________________________________________  FINAL CLINICAL IMPRESSION(S) / ED DIAGNOSES  Final diagnoses:  Elbow sprain, left, initial encounter  Shoulder pain, acute, left  Fall, initial encounter      NEW MEDICATIONS STARTED DURING THIS VISIT:  New Prescriptions   MELOXICAM (MOBIC) 15 MG TABLET    Take 1 tablet (15 mg total) by mouth daily.        Charline Bills Zaniya Mcaulay, PA-C 07/26/15 [id number]  Nance Pear, MD 07/26/15 2318

## 2015-07-26 NOTE — ED Notes (Signed)
Pt. Given ice pack and sling for lt. Shoulder to take home.

## 2015-07-26 NOTE — ED Notes (Signed)
Patient presents with c/o LEFT shoulder pain s/p fall on outstretched hand. (+) PMS; cap refill WNL; hand warm and dry.

## 2015-09-16 ENCOUNTER — Encounter: Payer: Self-pay | Admitting: Gastroenterology

## 2015-09-16 ENCOUNTER — Ambulatory Visit (INDEPENDENT_AMBULATORY_CARE_PROVIDER_SITE_OTHER): Payer: BLUE CROSS/BLUE SHIELD | Admitting: Gastroenterology

## 2015-09-16 VITALS — BP 110/80 | HR 80 | Ht 66.0 in | Wt 207.6 lb

## 2015-09-16 DIAGNOSIS — K589 Irritable bowel syndrome without diarrhea: Secondary | ICD-10-CM

## 2015-09-16 DIAGNOSIS — K649 Unspecified hemorrhoids: Secondary | ICD-10-CM | POA: Diagnosis not present

## 2015-09-16 DIAGNOSIS — K219 Gastro-esophageal reflux disease without esophagitis: Secondary | ICD-10-CM | POA: Diagnosis not present

## 2015-09-16 MED ORDER — PANTOPRAZOLE SODIUM 20 MG PO TBEC: 20.0000 mg | DELAYED_RELEASE_TABLET | Freq: Every day | ORAL | Status: DC

## 2015-09-16 NOTE — Progress Notes (Signed)
HPI :  50 y/o female here to follow up for constipation. She is a former patient of Dr. Deatra Ina, last seen in 2013.    Patient has an external hemorrhoids and history of internal hemorrhoids s/p banding in the past. Hemorrhoids rarely bleed and she thinks ithe appearance of the external skin tag bothers her more than anything. She was seen by surgery recently regarding removal of external hemorrhoid, who recommended she follow up with [place] for reassessment given her ongoing constipation. She has chronic constipation since childhood. She has on average less than one BM per day to every day. She has straining to produce a bowel movement in > 50% of her stools. She is often on the toilet for up to 5-10 minutes at a time. She denies the use manual maneuvers to help pull out stool in recent past. She has a normal sense of having to use the bathroom. She has some abdominal cramps reliably when she needs to use the bathroom, which are relieved reliably with a BM. Symptoms are stable over time.   She has been on Linzess in the past which worked well for her initially. She states however over time the dose was too strong for her and caused loose stools which she did not like and stopped it. She has tried miralax in the past, but she thought it could also be too strong for her and caused diarrhea. She has not tried fiber supplementation long term for this issue yet.  She is concerned about the side effect of having diarrhea and would prefer to be on the constipated side rather than looser. She has been on Levsin in the past which helps for severe cramps   No weight loss, she is aware of. Mother has had a gastric carcinoid. The patient has had a negative 24 Hr Urine testing for carcinoid.   Patient otherwise takes pantoprazole 40mg  once daily, as well as zantac 150mg  for GERD. She reports rare nocturnal symptoms but in generally her present regimen works well for her. She has tried taking zantac monotherapy and  protonix monotherapy did not work to control symptoms.   She also has had prior occurrence of diverticulitis 4 times in the past, last episode in October. She has been offered surgery but declined at this time. No FH of CRC.   Colonoscopy 05/2011 - diverticulosis, otherwise normal. No adenomas.   Past Medical History  Diagnosis Date  . Hemorrhoids   . Restless leg syndrome   . Hypercholesterolemia   . Hypertension   . Sleep apnea     cpap sometimes  . Depression   . GERD (gastroesophageal reflux disease)   . Microhematuria   . Galactorrhea   . Vertigo   . Dyslipidemia   . Diverticulitis      Past Surgical History  Procedure Laterality Date  . Cholecystectomy    . Band hemorrhoidectomy  2012    Dr Earlean Shawl  . Cesarean section    . Bladder suspension     Family History  Problem Relation Age of Onset  . Cancer Mother     Carcnoid tumors  . Diabetes Mother   . Hypertension Mother   . Atrial fibrillation Mother   . Diabetes Father   . Skin cancer Father   . Suicidality Father   . Hypertension Father   . Diabetes Sister   . Diabetes Sister   . Breast cancer      materanl great aunt   Social History  Substance Use  Topics  . Smoking status: Never Smoker   . Smokeless tobacco: Never Used  . Alcohol Use: No     Comment: Rare   Current Outpatient Prescriptions  Medication Sig Dispense Refill  . aspirin 81 MG EC tablet Take 81 mg by mouth daily. Swallow whole.    Marland Kitchen buPROPion (WELLBUTRIN XL) 300 MG 24 hr tablet Take 300 mg by mouth daily.    . carvedilol (COREG) 3.125 MG tablet Take 3.125 mg by mouth 2 (two) times daily with a meal.    . escitalopram (LEXAPRO) 20 MG tablet Take 20 mg by mouth daily.    . fenofibrate micronized (LOFIBRA) 200 MG capsule   4  . hyoscyamine (LEVSIN SL) 0.125 MG SL tablet     . losartan (COZAAR) 100 MG tablet   3  . norethindrone (MICRONOR,CAMILA,ERRIN) 0.35 MG tablet Take 1 tablet by mouth daily.    . pantoprazole (PROTONIX) 40 MG tablet  Take 40 mg by mouth daily.    Marland Kitchen triamterene-hydrochlorothiazide (MAXZIDE-25) 37.5-25 MG per tablet Take 0.5 tablets by mouth daily.    . valACYclovir (VALTREX) 1000 MG tablet Take 1,000 mg by mouth 2 (two) times daily.    . verapamil (CALAN-SR) 240 MG CR tablet   5   No current facility-administered medications for this visit.   No Known Allergies   Review of Systems: All systems reviewed and negative except where noted in HPI.   Lab Results  Component Value Date   WBC 13.2* 03/07/2015   HGB 12.6 03/07/2015   HCT 37.7 03/07/2015   MCV 90.7 03/07/2015   PLT 462* 03/07/2015    Lab Results  Component Value Date   ALT 19 03/07/2015   AST 16 03/07/2015   ALKPHOS 35* 03/07/2015   BILITOT 0.4 03/07/2015    Lab Results  Component Value Date   CREATININE 0.91 03/07/2015   BUN 18 03/07/2015   NA 136 03/07/2015   K 3.8 03/07/2015   CL 102 03/07/2015   CO2 25 03/07/2015     Physical Exam: BP 110/80 mmHg  Pulse 80  Ht 5\' 6"  (1.676 m)  Wt 207 lb 9.6 oz (94.167 kg)  BMI 33.52 kg/m2  LMP  (LMP Unknown) Constitutional: Pleasant,well-developed, female in no acute distress. HEENT: Normocephalic and atraumatic. Conjunctivae are normal. No scleral icterus. Neck supple.  Cardiovascular: Normal rate, regular rhythm.  Pulmonary/chest: Effort normal and breath sounds normal. No wheezing, rales or rhonchi. Abdominal: Soft, nondistended, nontender. Bowel sounds active throughout. There are no masses palpable. No hepatomegaly. Extremities: no edema Lymphadenopathy: No cervical adenopathy noted. Neurological: Alert and oriented to person place and time. Skin: Skin is warm and dry. No rashes noted. Psychiatric: Normal mood and affect. Behavior is normal.   ASSESSMENT AND PLAN: 50 y/o female here for follow up for what appears to be IBS-C and hemorrhoids. History as above, Linzess and miralax have both been too strong and caused diarrhea in the past, which she tolerated poorly. I  discussed options with her at present time. Given her hemorrhoids, and that diarrhea caused by other regimens, recommend a trial of daily fiber supplement at this time. I discussed options within this class and she will try Citrucel to help minimize the risk of bloating. I recommend she use this every day. If no improvement in her symptoms, we may consider low dose Linzess or trial of Amitiza. I don't think she warrants a colonoscopy at this time, due for recall in 2023 for screening purposes. She can follow up  as needed for this issue and follow up with surgery as needed for resection of external hemorrhoids.   Regarding her GERD and long term protonix use, we discussed the risks of long term PPIs. The risks of long term PPIs with current data include increased risk for chronic kidney disease, increased risk of fracture, increased risk of C diff, increased risk of pneumonia (short term usage), potentially increased risk of B12 / calcium deficiency, and rare risk of hypomagnesemia. Recent studies have also shown an association with increased risk of dementia and cardiovascular outcomes including stroke. These studies have showed an association between PPIs and several of these outcomes but no evidence of causality. The patient was counseled to use the lowest daily use of PPI needed to control symptoms. Renal function is currently normal. She agreed to lower dose of protonix to 20mg  daily, and use zantac up to BID PRN. If her symptoms worsen and she wants to go back on higher protonix dosing, she can call to go back on 40mg  dosing. All questions answered.   Brookford Cellar, MD [facility] Pager [phone]

## 2015-09-16 NOTE — Patient Instructions (Signed)
We have sent the following medications to your pharmacy for you to pick up at your convenience:  Protonix 20mg   You may use Citrucel mixed in water daily

## 2015-11-07 ENCOUNTER — Telehealth: Payer: Self-pay

## 2015-11-25 ENCOUNTER — Telehealth: Payer: Self-pay | Admitting: Neurology

## 2015-11-25 DIAGNOSIS — R635 Abnormal weight gain: Secondary | ICD-10-CM

## 2015-11-25 DIAGNOSIS — G4719 Other hypersomnia: Secondary | ICD-10-CM

## 2015-11-25 DIAGNOSIS — G2581 Restless legs syndrome: Secondary | ICD-10-CM

## 2015-11-25 DIAGNOSIS — E669 Obesity, unspecified: Secondary | ICD-10-CM

## 2015-11-25 DIAGNOSIS — G4733 Obstructive sleep apnea (adult) (pediatric): Secondary | ICD-10-CM

## 2015-11-25 DIAGNOSIS — G4761 Periodic limb movement disorder: Secondary | ICD-10-CM

## 2015-11-25 NOTE — Telephone Encounter (Signed)
Patient seen in 01/2015 and now wants her sleep study.  Can I get an order for the sleep study she needs?

## 2015-11-27 NOTE — Telephone Encounter (Signed)
Order is in.

## 2015-12-03 ENCOUNTER — Ambulatory Visit (INDEPENDENT_AMBULATORY_CARE_PROVIDER_SITE_OTHER): Payer: BLUE CROSS/BLUE SHIELD | Admitting: Neurology

## 2015-12-03 DIAGNOSIS — G479 Sleep disorder, unspecified: Secondary | ICD-10-CM

## 2015-12-03 DIAGNOSIS — G4720 Circadian rhythm sleep disorder, unspecified type: Secondary | ICD-10-CM

## 2015-12-03 DIAGNOSIS — G4733 Obstructive sleep apnea (adult) (pediatric): Secondary | ICD-10-CM

## 2015-12-09 ENCOUNTER — Telehealth: Payer: Self-pay | Admitting: Neurology

## 2015-12-09 DIAGNOSIS — G4733 Obstructive sleep apnea (adult) (pediatric): Secondary | ICD-10-CM

## 2015-12-09 NOTE — Telephone Encounter (Signed)
Patient referred by Dr. Philip Aspen, seen by me on 02/12/16, diagnostic PSG on 12/03/15, ins: [employer].   Please call and notify the patient that the recent sleep study did confirm the diagnosis of moderate obstructive sleep apnea and that I recommend treatment for this in the form of CPAP. This will require a repeat sleep study for proper titration and mask fitting. Please explain to patient and arrange for a CPAP titration study. I have placed an order in the chart. Thanks, and please route to [facility] for scheduling next sleep study.  [name], MD, PhD [facility])

## 2015-12-11 NOTE — Telephone Encounter (Signed)
LM (per DPR) with results and recommendations. I advised her that sleep lab will call to schedule titration study. Left call back number for any further questions.

## 2015-12-20 NOTE — Progress Notes (Signed)
Report done

## 2015-12-23 ENCOUNTER — Other Ambulatory Visit: Payer: Self-pay

## 2015-12-23 MED ORDER — PANTOPRAZOLE SODIUM 20 MG PO TBEC: 20.0000 mg | DELAYED_RELEASE_TABLET | Freq: Every day | ORAL | 1 refills | Status: DC

## 2015-12-24 ENCOUNTER — Other Ambulatory Visit: Payer: Self-pay | Admitting: General Surgery

## 2015-12-24 NOTE — Telephone Encounter (Signed)
error 

## 2015-12-24 NOTE — H&P (Signed)
  History of Present Illness [name] MD; [id number] 3:24 PM) Patient words: hems.  The patient is a 50 year old female who presents with hemorrhoids. 50yo F with anal issues. She reports hygeine issues due to anal skin tags. She has a h/o constipation, but this is better now that she is on Linzess. She reports bleeding with BM's occasionally when she has multiple BM's in a day. She was seen by Dr Zella Richer and thought to have decreased rectal tone. She is here for a second opinion on surgery. SHe denies any incontinence. She ocassionally has urgency. She denies any birth trauma.    Problem List/Past Medical [name], MD; [id number] 3:28 PM) ANAL SKIN TAG (K64.4) EXTERNAL HEMORRHOIDS (K64.4) HEMORRHOIDS, INTERNAL, WITH BLEEDING (K64.8)  Other Problems [name], MD; A999333 3:28 PM) Sleep Apnea Hypercholesterolemia Gastroesophageal Reflux Disease Hemorrhoids High blood pressure Cholelithiasis  Past Surgical History [name], MD; [id number] 3:28 PM) Cesarean Section - 1 Gallbladder Surgery - Laparoscopic  Diagnostic Studies History [name], MD; [id number] 3:28 PM) Pap Smear 1-5 years ago Colonoscopy 1-5 years ago Mammogram 1-3 years ago  Allergies Mammie Lorenzo, LPN; 624THL 075-GRM PM) No Known Drug Allergies 12/24/2015 (Marked as Inactive) Effexor *ANTIDEPRESSANTS*  Medication History Mammie Lorenzo, LPN; 624THL 075-GRM PM) BuPROPion HCl ER (XL) (300MG  Tablet ER 24HR, Oral daily) Active. Carvedilol (3.125MG  Tablet, Oral daily) Active. Escitalopram Oxalate (20MG  Tablet, Oral daily) Active. Fenofibrate Micronized (200MG  Capsule, Oral daily) Active. Levsin (0.125MG  Tablet, Oral daily) Active. Losartan Potassium (100MG  Tablet, Oral daily) Active. Norethindrone (0.35MG  Tablet, Oral daily) Active. Protonix (40MG  Tablet DR, Oral daily) Active. ValACYclovir HCl (500MG  Tablet, 2 Oral prn) Active. Verapamil HCl ER (240MG   Capsule ER 24HR, Oral) Active. Medications Reconciled  Social History [name], MD; [id number] 3:28 PM) No drug use Tobacco use Never smoker. Alcohol use Occasional alcohol use. Caffeine use Coffee.  Family History [name], MD; A999333 3:28 PM) Cancer Mother. Diabetes Mellitus Father, Mother, Sister. Hypertension Father.  Pregnancy / Birth History [name], MD; A999333 3:28 PM) Contraceptive History Oral contraceptives. Gravida 2 Irregular periods Maternal age 36-30 Para 2    Vitals Mammie Lorenzo LPN; 624THL 075-GRM PM) 12/24/2015 3:04 PM Weight: 219.6 lb Height: 66.5in Body Surface Area: 2.09 m Body Mass Index: 34.91 kg/m  Temp.: 98.63F(Oral)  Pulse: 106 (Regular)  BP: 136/84 (Sitting, Left Arm, Standard)      Physical Exam [name] MD; [id number] 3:28 PM)  The physical exam findings are as follows: Note:General-obese female in no acute distress.  HEENT-no facial lesions.  Chest: CTA  CV: RRR  Anorectal-there is an external floppy skin tag right anterior position. Minimally decreased tone. There are no palpable masses.    Assessment & Plan [name] MD; [id number] 3:30 PM)  ANAL SKIN TAG (K64.4) Impression: Patient with external skin tag that is enlarged and elongated. It is difficult to keep clean and gets inflamed easily. I have recommended excision. I believe her internal disease is regressing now that her bowel habits are better and can be controlled with RBL in the office if needed.

## 2016-01-06 ENCOUNTER — Ambulatory Visit (INDEPENDENT_AMBULATORY_CARE_PROVIDER_SITE_OTHER): Payer: BLUE CROSS/BLUE SHIELD | Admitting: Neurology

## 2016-01-06 DIAGNOSIS — G4720 Circadian rhythm sleep disorder, unspecified type: Secondary | ICD-10-CM

## 2016-01-06 DIAGNOSIS — G4733 Obstructive sleep apnea (adult) (pediatric): Secondary | ICD-10-CM

## 2016-01-09 ENCOUNTER — Ambulatory Visit: Payer: BLUE CROSS/BLUE SHIELD | Admitting: Physical Therapy

## 2016-01-10 ENCOUNTER — Telehealth: Payer: Self-pay | Admitting: Neurology

## 2016-01-10 DIAGNOSIS — G4733 Obstructive sleep apnea (adult) (pediatric): Secondary | ICD-10-CM

## 2016-01-10 NOTE — Telephone Encounter (Signed)
Patient referred by Dr. Philip Aspen, seen by me on 02/12/15, diagnostic PSG on 12/03/15, cpap study on 01/06/16, ins: [employer].   Please call and inform patient that I have entered an order for treatment with positive airway pressure (PAP) treatment of obstructive sleep apnea (OSA). She did well during the latest sleep study with CPAP. We will, therefore, arrange for a machine for home use through a DME (durable medical equipment) company of Her choice; and I will see the patient back in follow-up in about 8-10 weeks. Please also explain to the patient that I will be looking out for compliance data, which can be downloaded from the machine (stored on an SD card, that is inserted in the machine) or via remote access through a modem, that is built into the machine. At the time of the followup appointment we will discuss sleep study results and how it is going with PAP treatment at home. Please advise patient to bring Her machine at the time of the first FU visit, even though this is cumbersome. Bringing the machine for every visit after that will likely not be needed, but often helps for the first visit to troubleshoot if needed. Please re-enforce the importance of compliance with treatment and the need for [place] to monitor compliance data - often an insurance requirement and actually good feedback for the patient as far as how they are doing.  Also remind patient, that any interim PAP machine or mask issues should be first addressed with the DME company, as they can often help better with technical and mask fit issues. Please ask if patient has a preference regarding DME company.  Please also make sure, the patient has a follow-up appointment with me in about 8-10 weeks from the setup date, thanks.  Once you have spoken to the patient - and faxed/routed report to PCP and referring MD (if other than PCP), you can close this encounter, thanks,   [name], MD, PhD [facility] ([place])

## 2016-01-13 NOTE — Telephone Encounter (Signed)
I spoke to patient and she is aware of results and recommendations. She is willing to proceed with treatment. I will send orders to [facility]. I will send report to PCP. And I will send the patient a letter reminding her to make f/u appt and stress the importance of compliance.

## 2016-01-14 ENCOUNTER — Ambulatory Visit: Payer: BLUE CROSS/BLUE SHIELD | Attending: Internal Medicine | Admitting: Physical Therapy

## 2016-01-14 ENCOUNTER — Encounter: Payer: Self-pay | Admitting: Physical Therapy

## 2016-01-14 DIAGNOSIS — M25512 Pain in left shoulder: Secondary | ICD-10-CM | POA: Insufficient documentation

## 2016-01-14 DIAGNOSIS — M6281 Muscle weakness (generalized): Secondary | ICD-10-CM | POA: Insufficient documentation

## 2016-01-14 DIAGNOSIS — M62838 Other muscle spasm: Secondary | ICD-10-CM | POA: Insufficient documentation

## 2016-01-15 NOTE — Therapy (Signed)
[facility] [address] [place], [address] Phone: [phone]   Fax:  [phone]  Physical Therapy Evaluation  Patient Details  Name: [name] Ross MRN: [id number] Date of Birth: August 13, 1965 Referring Provider: Donnajean Lopes MD  Encounter Date: 01/14/2016      PT End of Session - 01/14/16 1928    Visit Number 1   Number of Visits 12   Date for PT Re-Evaluation 02/25/16   PT Start Time [id number]   PT Stop Time 1920   PT Time Calculation (min) 50 min   Activity Tolerance Patient tolerated treatment well   Behavior During Therapy [facility] for tasks assessed/performed      Past Medical History:  Diagnosis Date  . Depression   . Diverticulitis   . Dyslipidemia   . Galactorrhea   . GERD (gastroesophageal reflux disease)   . Hemorrhoids   . Hypercholesterolemia   . Hypertension   . Microhematuria   . Restless leg syndrome   . Sleep apnea    cpap sometimes  . Vertigo     Past Surgical History:  Procedure Laterality Date  . BAND HEMORRHOIDECTOMY  2012   Dr Earlean Shawl  . BLADDER SUSPENSION    . CESAREAN SECTION    . CHOLECYSTECTOMY      There were no vitals filed for this visit.       Subjective Assessment - 01/14/16 1847    Subjective Patient reports she is having shoulder stiffness, pain; better at rest, worse with sleeping on left side, reaching behind back, out to side, certain movements send sharp pain in her shoulder.    Pertinent History Fell in March 2017 on outstretched arm (left) with immediate pain, went to ED and had X rays with no fractures noted; Went to Dr. Justice Britain about a month later due to not getting better and has had 2 cortisone injections in left shoulder with some improvement noted (decreased pain with improved motion noted with raising arm out to side). Since the second injection she reports having intermittent sharp pain with certain movement.    Limitations House hold  activities;Other (comment)  reaching overhead and behind back and out to side   Patient Stated Goals to be able to lie on left side, to use left arm for personal care dressing upper body and behind back    Currently in Pain? No/denies  Pain ranges from 0/10 up to 4/10            [facility] PT Assessment - 01/14/16 1837      Assessment   Medical Diagnosis shoulder joint pain, left (M25.512)   Referring Provider Donnajean Lopes MD   Onset Date/Surgical Date 07/24/15  beginning of March, unsure of exact date   Hand Dominance Right   Next MD Visit none scheduled   Prior Therapy none     Precautions   Precautions None     Balance Screen   Has the patient fallen in the past 6 months Yes   How many times? [place], fell out of booth due to drop off   Has the patient had a decrease in activity level because of a fear of falling?  No   Is the patient reluctant to leave their home because of a fear of falling?  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   Leisure car shows, swim, exercise     Cognition   Overall Cognitive Status Within Functional Limits for tasks assessed     Objective; Posture: (+) hiking left shoulder and rounded left shoulder, left scapula higher than right AROM;  Left shoulder limited flexion 0-160, IR decreased behind back to L3, decreased ER 25% as compared to right shoulder Right shoulder WNL's without  pain Strength: both UE's grossly at least 4/5 major muscles groups without increased pain Palpation: (+) spasms and tenderness elicited along left upper trapezius muscle  Special tests: Neers (-) right/left shoulder Hawkins kennedy (-) bilaterally  Speeds test for biceps (-) bilaterally Outcome measure: QuickDash 16%  Treatment: Manual therapy: Soft tissue mobilization left upper trapezius and along medial border of left scapula with patient seated: goal: pain, reduce spasms, improve ROM  Therapeutic exercise: patient performed exercises with guidance, verbal and tactile cues and demonstration of PT: Sitting: scapular retraction x 10 Standing; pendulums  Lying/sitting: AAROM shoulders Left upper trapezius stretch performed 3 x 20 seconds  Patient response to treatment: improved soft tissue elasticity with decreased spasms with improved posture, decreased hiking left shoulder and more even scapulae, demonstrated good technique and understanding of exercises for home with verbal cues and following demonstration         PT Education - 01/14/16 1845    Education provided Yes   Education Details HEP: scapular retraction, pendulums, AAROM supine lying   Person(s) Educated Patient   Methods Explanation;Demonstration;Verbal cues;Handout   Comprehension Verbalized understanding;Returned demonstration;Verbal cues required             PT Long Term Goals - 01/14/16 2000      PT LONG TERM GOAL #1   Title Patient will demonstrate improved function with use of left UE and improved sleep as indicated by QuickDash score of 8% or less by 02/25/2016   Baseline Quick Dash 16%   Status New     PT LONG TERM GOAL #2   Title Patient will be independent with pain control and exercise without cuing by 02/25/2016 to allow transition to home program once discharged from physical therapy    Baseline patient with limited knowledge of appropriate pain control and exercise progression to return to  prior level of function   Status New               Plan - 01/14/16 2000    Clinical Impression Statement Patient is a 50 year old right hand dominant female who presents with left shoulder pain s/p fall 07/2015. She continues having shoulder stiffness, pain; better at rest, worse with sleeping on left side, reaching behind back, out to side, certain movements send sharp pain in her shoulder. She has functional limitations of decreased ROM, strength and will require physical therapy  Intervention to achieve goals and prior level of function.    Rehab Potential Good   Clinical Impairments Affecting Rehab Potential (+)age, motivated (-) trauma to left shoulder,    PT Frequency 2x / week  PT Duration 6 weeks   PT Treatment/Interventions Moist Heat;Dry needling;Therapeutic exercise;Ultrasound;Manual techniques;Taping;Neuromuscular re-education;Cryotherapy;Electrical Stimulation;Iontophoresis 4mg /ml Dexamethasone;Patient/family education   PT Next Visit Plan pain control, therapeutic exercise   PT Home Exercise Plan pendulums, scapular retraction, ROM exercises   Consulted and Agree with Plan of Care Patient      Patient will benefit from skilled therapeutic intervention in order to improve the following deficits and impairments:  Decreased strength, Pain, Decreased activity tolerance, Impaired UE functional use  Visit Diagnosis: Pain in left shoulder - Plan: PT plan of care cert/re-cert  Muscle weakness (generalized) - Plan: PT plan of care cert/re-cert  Other muscle spasm - Plan: PT plan of care cert/re-cert     Problem List Patient Active Problem List   Diagnosis Date Noted  . Constipation 06/15/2011  . Internal and external hemorrhoids without complication [id number]  . Sleep apnea 06/15/2011    Jomarie Longs PT 01/15/2016, 11:10 PM  [facility] [address], [place], [address] Phone: [phone]    Fax:  [phone]  Name: YUDIT DEALBA MRN: [id number] Date of Birth: 1966/04/16

## 2016-01-21 ENCOUNTER — Ambulatory Visit: Payer: BLUE CROSS/BLUE SHIELD | Admitting: Physical Therapy

## 2016-01-22 ENCOUNTER — Encounter: Payer: BLUE CROSS/BLUE SHIELD | Admitting: Physical Therapy

## 2016-01-23 ENCOUNTER — Ambulatory Visit: Payer: BLUE CROSS/BLUE SHIELD | Admitting: Physical Therapy

## 2016-01-28 ENCOUNTER — Encounter: Payer: BLUE CROSS/BLUE SHIELD | Admitting: Physical Therapy

## 2016-01-29 ENCOUNTER — Ambulatory Visit: Payer: BLUE CROSS/BLUE SHIELD | Attending: Internal Medicine | Admitting: Physical Therapy

## 2016-02-03 ENCOUNTER — Ambulatory Visit: Payer: BLUE CROSS/BLUE SHIELD | Admitting: Physical Therapy

## 2016-02-05 ENCOUNTER — Ambulatory Visit: Payer: BLUE CROSS/BLUE SHIELD | Admitting: Physical Therapy

## 2016-02-10 ENCOUNTER — Encounter (HOSPITAL_BASED_OUTPATIENT_CLINIC_OR_DEPARTMENT_OTHER): Payer: Self-pay | Admitting: *Deleted

## 2016-02-10 NOTE — Progress Notes (Signed)
NPO AFTER MN.  ARRIVE AT [id number].   NEEDS ISTAT AND EKG.  WILL TAKE AM MEDS W/ SIPS OF WATER WITH EXCEPTION NO MAXIDE DOS.

## 2016-02-12 ENCOUNTER — Encounter: Payer: BLUE CROSS/BLUE SHIELD | Admitting: Physical Therapy

## 2016-02-13 ENCOUNTER — Ambulatory Visit (HOSPITAL_BASED_OUTPATIENT_CLINIC_OR_DEPARTMENT_OTHER): Payer: BLUE CROSS/BLUE SHIELD | Admitting: Anesthesiology

## 2016-02-13 ENCOUNTER — Encounter (HOSPITAL_BASED_OUTPATIENT_CLINIC_OR_DEPARTMENT_OTHER): Payer: Self-pay | Admitting: *Deleted

## 2016-02-13 ENCOUNTER — Ambulatory Visit (HOSPITAL_BASED_OUTPATIENT_CLINIC_OR_DEPARTMENT_OTHER)
Admission: RE | Admit: 2016-02-13 | Discharge: 2016-02-13 | Disposition: A | Discharge status: Home / Self Care | Payer: BLUE CROSS/BLUE SHIELD | Source: Ambulatory Visit | Attending: General Surgery | Admitting: General Surgery

## 2016-02-13 ENCOUNTER — Encounter (HOSPITAL_BASED_OUTPATIENT_CLINIC_OR_DEPARTMENT_OTHER)
Admission: RE | Disposition: A | Discharge status: Home / Self Care | Payer: Self-pay | Source: Ambulatory Visit | Attending: General Surgery

## 2016-02-13 DIAGNOSIS — E669 Obesity, unspecified: Secondary | ICD-10-CM | POA: Insufficient documentation

## 2016-02-13 DIAGNOSIS — K644 Residual hemorrhoidal skin tags: Secondary | ICD-10-CM | POA: Diagnosis present

## 2016-02-13 DIAGNOSIS — F329 Major depressive disorder, single episode, unspecified: Secondary | ICD-10-CM | POA: Diagnosis not present

## 2016-02-13 DIAGNOSIS — Z79899 Other long term (current) drug therapy: Secondary | ICD-10-CM | POA: Insufficient documentation

## 2016-02-13 DIAGNOSIS — K641 Second degree hemorrhoids: Secondary | ICD-10-CM | POA: Diagnosis not present

## 2016-02-13 DIAGNOSIS — K219 Gastro-esophageal reflux disease without esophagitis: Secondary | ICD-10-CM | POA: Diagnosis not present

## 2016-02-13 DIAGNOSIS — I10 Essential (primary) hypertension: Secondary | ICD-10-CM | POA: Insufficient documentation

## 2016-02-13 DIAGNOSIS — G4730 Sleep apnea, unspecified: Secondary | ICD-10-CM | POA: Insufficient documentation

## 2016-02-13 DIAGNOSIS — Z6835 Body mass index (BMI) 35.0-35.9, adult: Secondary | ICD-10-CM | POA: Diagnosis not present

## 2016-02-13 HISTORY — DX: Presence of spectacles and contact lenses: Z97.3

## 2016-02-13 HISTORY — DX: Residual hemorrhoidal skin tags: K64.4

## 2016-02-13 HISTORY — DX: Nausea with vomiting, unspecified: R11.2

## 2016-02-13 HISTORY — DX: Rash and other nonspecific skin eruption: R21

## 2016-02-13 HISTORY — DX: Other specified postprocedural states: Z98.890

## 2016-02-13 HISTORY — DX: Personal history of other diseases of the digestive system: Z87.19

## 2016-02-13 HISTORY — DX: Diverticulosis of large intestine without perforation or abscess without bleeding: K57.30

## 2016-02-13 HISTORY — DX: Personal history of other complications of pregnancy, childbirth and the puerperium: Z87.59

## 2016-02-13 HISTORY — DX: Irritable bowel syndrome, unspecified: K58.9

## 2016-02-13 HISTORY — PX: EXCISION OF SKIN TAG: SHX6270

## 2016-02-13 LAB — POCT I-STAT 4, (NA,K, GLUC, HGB,HCT)
Glucose, Bld: 147 mg/dL — ABNORMAL HIGH (ref 65–99)
HCT: 38 % (ref 36.0–46.0)
Hemoglobin: 12.9 g/dL (ref 12.0–15.0)
Potassium: 4.1 mmol/L (ref 3.5–5.1)
Sodium: 139 mmol/L (ref 135–145)

## 2016-02-13 SURGERY — EXCISION, SKIN TAG
Anesthesia: Monitor Anesthesia Care | Site: Anus

## 2016-02-13 MED ORDER — MIDAZOLAM HCL 2 MG/2ML IJ SOLN
INTRAMUSCULAR | Status: AC
  Filled 2016-02-13: qty 2

## 2016-02-13 MED ORDER — SODIUM CHLORIDE 0.9% FLUSH
3.0000 mL | Freq: Two times a day (BID) | INTRAVENOUS | Status: DC
  Filled 2016-02-13: qty 3

## 2016-02-13 MED ORDER — PROPOFOL 500 MG/50ML IV EMUL
INTRAVENOUS | Status: DC | PRN
  Administered 2016-02-13: 200 ug/kg/min via INTRAVENOUS

## 2016-02-13 MED ORDER — SODIUM CHLORIDE 0.9% FLUSH
3.0000 mL | INTRAVENOUS | Status: DC | PRN
  Filled 2016-02-13: qty 3

## 2016-02-13 MED ORDER — HYDROCODONE-ACETAMINOPHEN 5-325 MG PO TABS: 1.0000 | ORAL_TABLET | ORAL | 0 refills | Status: DC | PRN

## 2016-02-13 MED ORDER — SODIUM CHLORIDE 0.9 % IV SOLN
250.0000 mL | INTRAVENOUS | Status: DC | PRN
  Filled 2016-02-13: qty 250

## 2016-02-13 MED ORDER — CHLORHEXIDINE GLUCONATE CLOTH 2 % EX PADS
6.0000 | MEDICATED_PAD | Freq: Once | CUTANEOUS | Status: DC
  Filled 2016-02-13: qty 6

## 2016-02-13 MED ORDER — FENTANYL CITRATE (PF) 100 MCG/2ML IJ SOLN
INTRAMUSCULAR | Status: AC
  Filled 2016-02-13: qty 2

## 2016-02-13 MED ORDER — ACETAMINOPHEN 500 MG PO TABS
ORAL_TABLET | ORAL | Status: AC
  Filled 2016-02-13: qty 2

## 2016-02-13 MED ORDER — LACTATED RINGERS IV SOLN
INTRAVENOUS | Status: DC
  Filled 2016-02-13: qty 1000

## 2016-02-13 MED ORDER — BUPIVACAINE-EPINEPHRINE 0.5% -1:200000 IJ SOLN
INTRAMUSCULAR | Status: DC | PRN
  Administered 2016-02-13: 20 mL

## 2016-02-13 MED ORDER — OXYCODONE HCL 5 MG PO TABS
5.0000 mg | ORAL_TABLET | ORAL | Status: DC | PRN
  Filled 2016-02-13: qty 2

## 2016-02-13 MED ORDER — MIDAZOLAM HCL 5 MG/5ML IJ SOLN
INTRAMUSCULAR | Status: DC | PRN
  Administered 2016-02-13 (×2): 1 mg via INTRAVENOUS

## 2016-02-13 MED ORDER — PROMETHAZINE HCL 25 MG/ML IJ SOLN
6.2500 mg | INTRAMUSCULAR | Status: DC | PRN
  Filled 2016-02-13: qty 1

## 2016-02-13 MED ORDER — FENTANYL CITRATE (PF) 100 MCG/2ML IJ SOLN
INTRAMUSCULAR | Status: DC | PRN
  Administered 2016-02-13: 50 ug via INTRAVENOUS

## 2016-02-13 MED ORDER — LIDOCAINE 2% (20 MG/ML) 5 ML SYRINGE
INTRAMUSCULAR | Status: AC
  Filled 2016-02-13: qty 5

## 2016-02-13 MED ORDER — ACETAMINOPHEN 500 MG PO TABS
1000.0000 mg | ORAL_TABLET | Freq: Once | ORAL | Status: AC
  Administered 2016-02-13: 1000 mg via ORAL
  Filled 2016-02-13: qty 2

## 2016-02-13 MED ORDER — PROPOFOL 10 MG/ML IV BOLUS
INTRAVENOUS | Status: DC | PRN
  Administered 2016-02-13: 20 mg via INTRAVENOUS

## 2016-02-13 MED ORDER — ACETAMINOPHEN 650 MG RE SUPP
650.0000 mg | RECTAL | Status: DC | PRN
  Filled 2016-02-13: qty 1

## 2016-02-13 MED ORDER — FENTANYL CITRATE (PF) 100 MCG/2ML IJ SOLN
25.0000 ug | INTRAMUSCULAR | Status: DC | PRN
  Filled 2016-02-13: qty 1

## 2016-02-13 MED ORDER — PROPOFOL 500 MG/50ML IV EMUL
INTRAVENOUS | Status: AC
  Filled 2016-02-13: qty 50

## 2016-02-13 MED ORDER — ACETAMINOPHEN 325 MG PO TABS
650.0000 mg | ORAL_TABLET | ORAL | Status: DC | PRN
  Filled 2016-02-13: qty 2

## 2016-02-13 MED ORDER — LACTATED RINGERS IV SOLN
INTRAVENOUS | Status: DC
  Administered 2016-02-13: 07:00:00 via INTRAVENOUS
  Filled 2016-02-13: qty 1000

## 2016-02-13 SURGICAL SUPPLY — 23 items
APL SKNCLS STERI-STRIP NONHPOA (GAUZE/BANDAGES/DRESSINGS) ×1
BENZOIN TINCTURE PRP APPL 2/3 (GAUZE/BANDAGES/DRESSINGS) ×1 IMPLANT
BLADE SURG 15 STRL LF DISP TIS (BLADE) IMPLANT
BLADE SURG 15 STRL SS (BLADE) ×2
BRIEF STRETCH FOR OB PAD LRG (UNDERPADS AND DIAPERS) ×2 IMPLANT
COVER BACK TABLE 60X90IN (DRAPES) ×2 IMPLANT
COVER MAYO STAND STRL (DRAPES) ×2 IMPLANT
DRAPE LAPAROTOMY 100X72 PEDS (DRAPES) ×2 IMPLANT
DRAPE UTILITY XL STRL (DRAPES) ×2 IMPLANT
DRSG PAD ABDOMINAL 8X10 ST (GAUZE/BANDAGES/DRESSINGS) ×1 IMPLANT
GLOVE BIO SURGEON STRL SZ 6.5 (GLOVE) ×4 IMPLANT
GLOVE INDICATOR 7.0 STRL GRN (GLOVE) ×2 IMPLANT
GOWN STRL REUS W/ TWL XL LVL3 (GOWN DISPOSABLE) ×1 IMPLANT
GOWN STRL REUS W/TWL 2XL LVL3 (GOWN DISPOSABLE) ×2 IMPLANT
GOWN STRL REUS W/TWL XL LVL3 (GOWN DISPOSABLE) ×2
NEEDLE HYPO 22GX1.5 SAFETY (NEEDLE) ×2 IMPLANT
SPONGE GAUZE 4X4 12PLY STER LF (GAUZE/BANDAGES/DRESSINGS) ×1 IMPLANT
SUT CHROMIC 3 0 SH 27 (SUTURE) ×1 IMPLANT
SYR CONTROL 10ML LL (SYRINGE) ×2 IMPLANT
TOWEL OR 17X24 6PK STRL BLUE (TOWEL DISPOSABLE) ×2 IMPLANT
TRAY DSU PREP LF (CUSTOM PROCEDURE TRAY) ×2 IMPLANT
UNDERPAD 30X30 INCONTINENT (UNDERPADS AND DIAPERS) IMPLANT
WATER STERILE IRR 1000ML POUR (IV SOLUTION) ×2 IMPLANT

## 2016-02-13 NOTE — Anesthesia Postprocedure Evaluation (Signed)
Anesthesia Post Note  Patient: [name] Ross  Procedure(s) Performed: Procedure(s) (LRB): ANAL SKIN TAG EXCISION (N/A)  Patient location during evaluation: PACU Anesthesia Type: MAC Level of consciousness: awake and alert Pain management: pain level controlled Vital Signs Assessment: post-procedure vital signs reviewed and stable Respiratory status: spontaneous breathing, nonlabored ventilation, respiratory function stable and patient connected to nasal cannula oxygen Cardiovascular status: stable and blood pressure returned to baseline Anesthetic complications: no    Last Vitals:  Vitals:   02/13/16 0641 02/13/16 0841  BP: (!) 142/91 (!) 138/97  Pulse: 95 88  Resp: 16 13  Temp: 36.6 C 36.7 C    Last Pain:  Vitals:   02/13/16 0641  TempSrc: Oral                 Tareva Leske J

## 2016-02-13 NOTE — H&P (Signed)
The patient is a 50 year old female who presents with hemorrhoids. 50yo F with anal issues. She reports hygeine issues due to anal skin tags. She has a h/o constipation, but this is better now that she is on Linzess. She reports bleeding with BM's occasionally when she has multiple BM's in a day. She was seen by Dr Zella Richer and thought to have decreased rectal tone. She is here for a second opinion on surgery. SHe denies any incontinence. She ocassionally has urgency. She denies any birth trauma.    Problem List/Past Medical [name] Ruff, [name] Ross; [id number] 3:28 PM) ANAL SKIN TAG (K64.4) EXTERNAL HEMORRHOIDS (K64.4) HEMORRHOIDS, INTERNAL, WITH BLEEDING (K64.8)  Other Problems [name] Ruff, [name] Ross; A999333 3:28 PM) Sleep Apnea Hypercholesterolemia Gastroesophageal Reflux Disease Hemorrhoids High blood pressure Cholelithiasis  Past Surgical History [name] Ruff, [name] Ross; A999333 3:28 PM) Cesarean Section - 1 Gallbladder Surgery - Laparoscopic  Diagnostic Studies History [name] Ruff, [name] Ross; A999333 3:28 PM) Pap Smear 1-5 years ago Colonoscopy 1-5 years ago Mammogram 1-3 years ago  Allergies [name] Lorenzo, [name] Ross; 624THL 075-GRM PM) No Known Drug Allergies 12/24/2015 (Marked as Inactive) Effexor *ANTIDEPRESSANTS*  Medication History [name] Lorenzo, [name] Ross; 624THL 075-GRM PM) BuPROPion HCl ER (XL) (300MG  Tablet ER 24HR, Oral daily) Active. Carvedilol (3.125MG  Tablet, Oral daily) Active. Escitalopram Oxalate (20MG  Tablet, Oral daily) Active. Fenofibrate Micronized (200MG  Capsule, Oral daily) Active. Levsin (0.125MG  Tablet, Oral daily) Active. Losartan Potassium (100MG  Tablet, Oral daily) Active. Norethindrone (0.35MG  Tablet, Oral daily) Active. Protonix (40MG  Tablet DR, Oral daily) Active. ValACYclovir HCl (500MG  Tablet, 2 Oral prn) Active. Verapamil HCl ER (240MG  Capsule ER 24HR, Oral) Active. Medications Reconciled  Social History [name] Ruff, [name] Ross; [id number] 3:28 PM) No drug use Tobacco use Never smoker. Alcohol use Occasional alcohol use. Caffeine use Coffee.  Family History [name] Ruff, [name] Ross; A999333 3:28 PM) Cancer Mother. Diabetes Mellitus Father, Mother, Sister. Hypertension Father.  Pregnancy / Birth History [name] Ruff, [name] Ross; A999333 3:28 PM) Contraceptive History Oral contraceptives. Gravida 2 Irregular periods Maternal age 50-30 Para 2   BP (!) 142/91   Pulse 95   Temp 97.8 F (36.6 C) (Oral)   Resp 16   Ht 5\' 6"  (1.676 m)   Wt 101.2 kg (223 lb)   LMP  (LMP Unknown)   SpO2 97%   BMI 35.99 kg/m    Review of Systems  Constitutional: Negative for chills and fever.  Eyes: Negative for blurred vision.  Respiratory: Negative for cough and shortness of breath.   Cardiovascular: Negative for chest pain.  Gastrointestinal: Negative for abdominal pain, nausea and vomiting.  Genitourinary: Negative for dysuria, frequency and urgency.  Musculoskeletal: Negative for myalgias and neck pain.  Neurological: Negative for dizziness and headaches.     Physical Exam   The physical exam findings are as follows: Note:General-obese female in no acute distress.  HEENT-no facial lesions.  Chest: CTA  CV: RRR  Anorectal-there is an external floppy skin tag right anterior position. Minimally decreased tone. There are no palpable masses.    Assessment & Plan   ANAL SKIN TAG (K64.4) Impression: Patient with external skin tag that is enlarged and elongated. It is difficult to keep clean and gets inflamed easily. I have recommended excision. I believe her internal disease is regressing now that her bowel habits are better and can be controlled with RBL in the office if needed.

## 2016-02-13 NOTE — Discharge Instructions (Addendum)

## 2016-02-13 NOTE — Op Note (Signed)
02/13/2016  8:35 AM  PATIENT:  [name] Ross  50 y.o. female  Patient Care Team: Leanna Battles, MD as PCP - General (Internal Medicine)  PRE-OPERATIVE DIAGNOSIS:  Anal skin tag  POST-OPERATIVE DIAGNOSIS:  Anterior anal skin tag  PROCEDURE:  Procedure(s): ANAL SKIN TAG EXCISION  SURGEON:  Surgeon(s): [name], MD  ASSISTANT: none   ANESTHESIA:   local and MAC  SPECIMEN:  No Specimen  DISPOSITION OF SPECIMEN:  N/A  COUNTS:  YES  PLAN OF CARE: Discharge to home after PACU  PATIENT DISPOSITION:  PACU - hemodynamically stable.  INDICATION: 50 year old female who presents to the office for complaints of a enlarging anal skin tag. I recommended excision.    OR FINDINGS: Benign-appearing anterior anal skin tag, grade 2 internal hemorrhoids  DESCRIPTION: the patient was identified in the preoperative holding area and taken to the OR where they were laid on the operating room table.  MAC anesthesia was induced without difficulty. The patient was then positioned in prone jackknife position with buttocks gently taped apart.  The patient was then prepped and draped in usual sterile fashion.  SCDs were noted to be in place prior to the initiation of anesthesia. A surgical timeout was performed indicating the correct patient, procedure, positioning and need for preoperative antibiotics.  A rectal block was performed using Marcaine with epinephrine.    I began with a digital rectal exam.  There were no masses noted.  I then placed a Hill-Ferguson anoscope into the anal canal and evaluated this completely.  Patient had grade 2 internal hemorrhoids at the left lateral and right posterior positions. There was minimal inflammation. There was an enlarged anterior anal skin tag. This was excised using Metzenbaum scissors. A 3-0 chromic suture was used to close the defect. Hemostasis was good. The patient tolerated procedure well was awakened and sent to the postanesthesia care unit in stable  conditions. All counts were correct per operating room staff.

## 2016-02-13 NOTE — Anesthesia Preprocedure Evaluation (Signed)
Anesthesia Evaluation  Patient identified by MRN, date of birth, ID band Patient awake    Reviewed: Allergy & Precautions, NPO status , Patient's Chart, lab work & pertinent test results  History of Anesthesia Complications (+) PONV and history of anesthetic complications  Airway Mallampati: II  TM Distance: >3 FB Neck ROM: Full    Dental no notable dental hx.    Pulmonary sleep apnea ,    Pulmonary exam normal breath sounds clear to auscultation       Cardiovascular hypertension, Pt. on medications Normal cardiovascular exam Rhythm:Regular Rate:Normal     Neuro/Psych PSYCHIATRIC DISORDERS Depression negative neurological ROS     GI/Hepatic Neg liver ROS, GERD  Medicated,  Endo/Other  negative endocrine ROS  Renal/GU negative Renal ROS  negative genitourinary   Musculoskeletal negative musculoskeletal ROS (+)   Abdominal (+) + obese,   Peds negative pediatric ROS (+)  Hematology negative hematology ROS (+)   Anesthesia Other Findings   Reproductive/Obstetrics negative OB ROS                             Anesthesia Physical Anesthesia Plan  ASA: II  Anesthesia Plan: MAC   Post-op Pain Management:    Induction: Intravenous  Airway Management Planned: Natural Airway  Additional Equipment:   Intra-op Plan:   Post-operative Plan:   Informed Consent: I have reviewed the patients History and Physical, chart, labs and discussed the procedure including the risks, benefits and alternatives for the proposed anesthesia with the patient or authorized representative who has indicated his/her understanding and acceptance.   Dental advisory given  Plan Discussed with: CRNA  Anesthesia Plan Comments:         Anesthesia Quick Evaluation

## 2016-02-13 NOTE — Transfer of Care (Signed)
Immediate Anesthesia Transfer of Care Note  Patient: [name] Ross  Procedure(s) Performed: Procedure(s) (LRB): ANAL SKIN TAG EXCISION (N/A)  Patient Location: PACU  Anesthesia Type: MAC  Level of Consciousness: awake, alert , oriented and patient cooperative  Airway & Oxygen Therapy: Patient Spontanous Breathing and Patient connected to face mask oxygen  Post-op Assessment: Report given to PACU RN and Post -op Vital signs reviewed and stable  Post vital signs: Reviewed and stable  Complications: No apparent anesthesia complications Last Vitals:  Vitals:   02/13/16 0641 02/13/16 0841  BP: (!) 142/91 (!) 138/97  Pulse: 95 88  Resp: 16 13  Temp: 36.6 C 36.7 C

## 2016-02-14 ENCOUNTER — Encounter (HOSPITAL_BASED_OUTPATIENT_CLINIC_OR_DEPARTMENT_OTHER): Payer: Self-pay | Admitting: General Surgery

## 2016-02-14 ENCOUNTER — Encounter: Payer: BLUE CROSS/BLUE SHIELD | Admitting: Physical Therapy

## 2016-02-18 ENCOUNTER — Encounter: Payer: BLUE CROSS/BLUE SHIELD | Admitting: Physical Therapy

## 2016-02-20 ENCOUNTER — Encounter: Payer: BLUE CROSS/BLUE SHIELD | Admitting: Physical Therapy

## 2016-04-28 ENCOUNTER — Ambulatory Visit: Payer: BLUE CROSS/BLUE SHIELD | Admitting: Physical Therapy

## 2016-04-30 ENCOUNTER — Encounter: Payer: BLUE CROSS/BLUE SHIELD | Admitting: Physical Therapy

## 2016-05-13 ENCOUNTER — Encounter: Payer: BLUE CROSS/BLUE SHIELD | Admitting: Physical Therapy

## 2019-03-08 ENCOUNTER — Encounter: Payer: Self-pay | Admitting: Emergency Medicine

## 2019-03-08 ENCOUNTER — Emergency Department
Admission: EM | Admit: 2019-03-08 | Discharge: 2019-03-08 | Disposition: A | Discharge status: Home / Self Care | Payer: Self-pay | Attending: Student | Admitting: Student

## 2019-03-08 ENCOUNTER — Other Ambulatory Visit: Payer: Self-pay

## 2019-03-08 DIAGNOSIS — Z79899 Other long term (current) drug therapy: Secondary | ICD-10-CM | POA: Insufficient documentation

## 2019-03-08 DIAGNOSIS — R42 Dizziness and giddiness: Secondary | ICD-10-CM | POA: Insufficient documentation

## 2019-03-08 DIAGNOSIS — I10 Essential (primary) hypertension: Secondary | ICD-10-CM | POA: Insufficient documentation

## 2019-03-08 LAB — BASIC METABOLIC PANEL
Anion gap: 10 (ref 5–15)
BUN: 25 mg/dL — ABNORMAL HIGH (ref 6–20)
CO2: 22 mmol/L (ref 22–32)
Calcium: 9.5 mg/dL (ref 8.9–10.3)
Chloride: 104 mmol/L (ref 98–111)
Creatinine, Ser: 0.65 mg/dL (ref 0.44–1.00)
GFR calc Af Amer: >60 mL/min (ref >60)
GFR calc non Af Amer: >60 mL/min (ref >60)
Glucose, Bld: 109 mg/dL — ABNORMAL HIGH (ref 70–99)
Potassium: 4 mmol/L (ref 3.5–5.1)
Sodium: 136 mmol/L (ref 135–145)

## 2019-03-08 LAB — CBC WITH DIFFERENTIAL/PLATELET
Abs Immature Granulocytes: 0.12 10*3/uL — ABNORMAL HIGH (ref 0.00–0.07)
Basophils Absolute: 0 10*3/uL (ref 0.0–0.1)
Basophils Relative: 1 %
Eosinophils Absolute: 0.1 10*3/uL (ref 0.0–0.5)
Eosinophils Relative: 2 %
HCT: 38.5 % (ref 36.0–46.0)
Hemoglobin: 13 g/dL (ref 12.0–15.0)
Immature Granulocytes: 2 %
Lymphocytes Relative: 30 %
Lymphs Abs: 2 10*3/uL (ref 0.7–4.0)
MCH: 29.7 pg (ref 26.0–34.0)
MCHC: 33.8 g/dL (ref 30.0–36.0)
MCV: 88.1 fL (ref 80.0–100.0)
Monocytes Absolute: 0.5 10*3/uL (ref 0.1–1.0)
Monocytes Relative: 8 %
Neutro Abs: 3.8 10*3/uL (ref 1.7–7.7)
Neutrophils Relative %: 57 %
Platelets: 416 10*3/uL — ABNORMAL HIGH (ref 150–400)
RBC: 4.37 MIL/uL (ref 3.87–5.11)
RDW: 13.3 % (ref 11.5–15.5)
WBC: 6.6 10*3/uL (ref 4.0–10.5)
nRBC: 0 % (ref 0.0–0.2)

## 2019-03-08 LAB — URINALYSIS, COMPLETE (UACMP) WITH MICROSCOPIC
Bacteria, UA: NONE SEEN
Bilirubin Urine: NEGATIVE
Glucose, UA: NEGATIVE mg/dL
Ketones, ur: NEGATIVE mg/dL
Leukocytes,Ua: NEGATIVE
Nitrite: NEGATIVE
Protein, ur: NEGATIVE mg/dL
Specific Gravity, Urine: 1.019 (ref 1.005–1.030)
pH: 7 (ref 5.0–8.0)

## 2019-03-08 MED ORDER — MECLIZINE HCL 25 MG PO TABS
25.0000 mg | ORAL_TABLET | Freq: Once | ORAL | Status: AC
  Administered 2019-03-08: 25 mg via ORAL
  Filled 2019-03-08: qty 1

## 2019-03-08 MED ORDER — MECLIZINE HCL 25 MG PO TABS: 25.0000 mg | ORAL_TABLET | Freq: Three times a day (TID) | ORAL | 0 refills | Status: AC | PRN

## 2019-03-08 NOTE — ED Triage Notes (Signed)
Pt reports woke up this am around 10 and felt dizzy and swimmy headed. Pt reports went to dentist and had tooth pulled and when they sat her up from that procedure she felt worse, like she was going to pass out. Pt reports nauseated at that point as well.

## 2019-03-08 NOTE — ED Provider Notes (Addendum)
[facility] Emergency Department Provider Note  ____________________________________________   First MD Initiated Contact with Patient 03/08/19 1216     (approximate)  I have reviewed the triage vital signs and the nursing notes.  History  Chief Complaint Dizziness    HPI [name] Ross is a 53 y.o. female with history of depression, GERD, IBS, HTN who presents for dizziness. Patient states she woke up this morning and had a very brief episode of dizziness, lasted several seconds and then resolved. Later in the morning she was at the dentist having a procedure done (no numbness or injections performed) where she was laid back flat. When they sat her up she had another episode of dizziness. She describes the dizziness as an intense spinning sensation. This was associated with nausea, but no vomiting.  She tried walking from the patient room to the waiting room and felt off balance like she might fall (no falls or injuries occurred).  Again, this episode spontaneously resolved after approximately 5 minutes.  On arrival to the emergency department she is asymptomatic aside from some very mild nausea.  She denies any associated headache, facial droop, speech difficulties/weakness, numbness, tingling.  No associated chest pain, palpitations, shortness of breath.  No recent illnesses or infections.  No prior history of the same.   Past Medical Hx Past Medical History:  Diagnosis Date  . Anal skin tag   . Depression   . Diverticulosis, sigmoid   . Dyslipidemia   . GERD (gastroesophageal reflux disease)   . Hemorrhoids    internal and external  . History of diverticulitis    10/ 2016  . History of galactorrhea   . Hypertension   . IBS (irritable bowel syndrome)   . PONV (postoperative nausea and vomiting)    severe  . Rash    elbows and behind knees  . Restless leg syndrome   . Sleep apnea    cpap sometimes  . Wears glasses     Problem List Patient  Active Problem List   Diagnosis Date Noted  . Constipation 06/15/2011  . Internal and external hemorrhoids without complication [id number]  . Sleep apnea 06/15/2011    Past Surgical Hx Past Surgical History:  Procedure Laterality Date  . BAND HEMORRHOIDECTOMY  2012   Dr Earlean Shawl  . CESAREAN SECTION  1994  . COLONOSCOPY  06/18/2011  . CYSTO/  BILATERAL PYELOGRAM RETROGRADES/  TRANSOBTURATOR SLING  03/08/2006  . EXCISION OF SKIN TAG N/A 02/13/2016   Procedure: ANAL SKIN TAG EXCISION;  Surgeon: [name], MD;  Location: [facility];  Service: General;  Laterality: N/A;  . LAPAROSCOPIC CHOLECYSTECTOMY  05/22/2001    Medications Prior to Admission medications   Medication Sig Start Date End Date Taking? Authorizing Provider  buPROPion (WELLBUTRIN XL) 300 MG 24 hr tablet Take 300 mg by mouth every morning.     [provider]  carvedilol (COREG) 3.125 MG tablet Take 3.125 mg by mouth 2 (two) times daily with a meal.    [provider]  escitalopram (LEXAPRO) 20 MG tablet Take 20 mg by mouth every morning.     [provider]  fenofibrate micronized (LOFIBRA) 200 MG capsule Take 200 mg by mouth daily before breakfast.  02/04/15   [provider]  HYDROcodone-acetaminophen (NORCO/VICODIN) 5-325 MG tablet Take 1 tablet by mouth every 4 (four) hours as needed. [id number]   [name], MD  hydrOXYzine (ATARAX/VISTARIL) 25 MG tablet Take 25 mg by mouth 3 (three)  times daily as needed.    [provider]  hyoscyamine (LEVSIN SL) 0.125 MG SL tablet Take 0.125 mg by mouth every 4 (four) hours as needed.  06/12/11   [provider]  losartan (COZAAR) 100 MG tablet Take 100 mg by mouth every morning.  12/01/14   [provider]  norethindrone (MICRONOR,CAMILA,ERRIN) 0.35 MG tablet Take 1 tablet by mouth every morning.     [provider]  pantoprazole (PROTONIX) 20 MG tablet Take 1 tablet (20 mg total) by mouth daily.  Patient taking differently: Take 20 mg by mouth every morning.  12/23/15   Armbruster, Carlota Raspberry, MD  predniSONE (DELTASONE) 10 MG tablet Take 40 mg by mouth daily with breakfast.    [provider]  triamterene-hydrochlorothiazide (MAXZIDE-25) 37.5-25 MG per tablet Take 0.5 tablets by mouth every morning.     [provider]  valACYclovir (VALTREX) 1000 MG tablet Take 1,000 mg by mouth 2 (two) times daily as needed.     [provider]  verapamil (CALAN-SR) 240 MG CR tablet Take 240 mg by mouth every morning.  02/04/15   [provider]  zolpidem (AMBIEN) 10 MG tablet Take 10 mg by mouth at bedtime as needed for sleep.    [provider]    Allergies Patient has no known allergies.  Family Hx Family History  Problem Relation Age of Onset  . Cancer Mother        Carcnoid tumors  . Diabetes Mother   . Hypertension Mother   . Atrial fibrillation Mother   . Diabetes Father   . Skin cancer Father   . Suicidality Father   . Hypertension Father   . Diabetes Sister   . Diabetes Sister   . Breast cancer Other        materanl great aunt    Social Hx Social History   Tobacco Use  . Smoking status: Never Smoker  . Smokeless tobacco: Never Used  Substance Use Topics  . Alcohol use: No    Alcohol/week: 0.0 standard drinks  . Drug use: No     Review of Systems  Constitutional: Negative for fever, chills. Eyes: Negative for visual changes. ENT: Negative for sore throat. Cardiovascular: Negative for chest pain. Respiratory: Negative for shortness of breath. Gastrointestinal: Negative for nausea, vomiting.  Genitourinary: Negative for dysuria. Musculoskeletal: Negative for leg swelling. Skin: Negative for rash. Neurological: + dizziness   Physical Exam  Vital Signs: ED Triage Vitals [03/08/19 1213]  Enc Vitals Group     BP (!) 151/113     Pulse Rate (!) 105     Resp 20     Temp 98.9 F (37.2 C)     Temp Source Oral     SpO2  97 %     Weight 219 lb (99.3 kg)     Height 5\' 6"  (1.676 m)     Head Circumference      Peak Flow      Pain Score 0     Pain Loc      Pain Edu?      Excl. in [place]?     Constitutional: Alert and oriented.  Head: Normocephalic. Atraumatic. Eyes: Conjunctivae clear. Sclera anicteric. EOMI. 1-2 beat, unidirectional, end gaze horizontal nystagmus, does not elicit symptoms.  Ears: Bilateral EAC with mild cerumen, visualized TM bilaterally without erythema or posterior effusion. No vesicles or lesions. Nose: No congestion. No rhinorrhea. Mouth/Throat: Mucous membranes are moist.  Neck: No stridor.   Cardiovascular:  Normal rate, regular rhythm. Extremities well perfused. Respiratory: Normal respiratory effort.  Lungs CTAB. Gastrointestinal: Soft. Non-tender. Non-distended.  Musculoskeletal: No lower extremity edema. No deformities. Neurologic:  Normal speech and language. No gross focal neurologic deficits are appreciated. Alert and oriented.  Face symmetric.  Tongue midline.  Cranial nerves II through XII intact. UE and LE strength 5/5 and symmetric. UE and LE SILT. Finger to nose in tact, no ataxia. Ambulatory with steady gait. Skin: Skin is warm, dry and intact. No rash noted. Psychiatric: Mood and affect are appropriate for situation.  EKG  Personally reviewed.   Rate: 98 Rhythm: sinus Axis: normal Intervals: WNL No STEMI    Radiology  N/A   Procedures  Procedure(s) performed (including critical care):  Procedures   Initial Impression / Assessment and Plan / ED Course  53 y.o. female who presents to the ED for dizziness, as above.   Ddx: peripheral vertigo, BPPV, dehydration, positional symptomatic orthostatic hypotension. Presentation and exam does not appear consistent with central etiology - intermittent, now resolved, no other associated neurological findings per history or objectively on exam. Do not feel head imaging is indicated at this time, patient agreeable  with this. No evidence of ear effusion or vesicles in EAC.  Plan: labs, urine, symptomatic treatment, encourage fluids, and reassess  Patient reports she continues to feel improved.  Labs are unremarkable, electrolytes without derangements, urine without evidence of infection.  EKG without acute ischemic changes or arrhythmia.  As such, will plan for discharge, provided prescription for meclizine as needed, information for Epley maneuver, and advised outpatient follow-up.  Given return precautions.  Patient agreeable with plan.   Final Clinical Impression(s) / ED Diagnosis  Final diagnoses:  Dizziness  Vertigo       Note:  This document was prepared using Dragon voice recognition software and may include unintentional dictation errors.     Lilia Pro., MD 03/08/19 1341

## 2019-03-08 NOTE — Discharge Instructions (Signed)
Thank you for letting us take care of you in the emergency department today.   Please continue to take any regular, prescribed medications.   New medications we have prescribed:  - Meclizine - take as needed for dizziness, nausea  Please follow up with: - Your primary care doctor to review your ER visit and follow up on your symptoms.   Please return to the ER for any new or worsening symptoms.

## 2019-04-04 ENCOUNTER — Encounter: Payer: Self-pay | Admitting: Neurology

## 2019-04-04 ENCOUNTER — Ambulatory Visit: Payer: Self-pay | Admitting: Neurology

## 2019-04-04 ENCOUNTER — Other Ambulatory Visit: Payer: Self-pay

## 2019-04-04 VITALS — BP 141/96 | HR 91 | Ht 66.0 in | Wt 225.0 lb

## 2019-04-04 DIAGNOSIS — G4733 Obstructive sleep apnea (adult) (pediatric): Secondary | ICD-10-CM

## 2019-04-04 DIAGNOSIS — Z9989 Dependence on other enabling machines and devices: Secondary | ICD-10-CM

## 2019-04-04 NOTE — Progress Notes (Signed)
Subjective:    Patient ID: [name] Ross is a 53 y.o. female.  HPI     [name] Age, MD, [name], [name], [name]   Dear Dr. Philip Aspen,    I saw your patient, [name] Ross, upon your kind request in my sleep clinic today for reevaluation of her obstructive sleep apnea.  The patient is unaccompanied today.  I have seen her once over 4 years ago for sleep apnea evaluation.  As you know, [name] Ross is a 53 year old right-handed woman with an underlying medical history of vertigo, hyperlipidemia, hypertension, depression, restless leg syndrome, migraine headaches, allergic rhinitis, and obesity, who was originally diagnosed with obstructive sleep apnea in 2010.  She had sleep study testing in July 2017 which indicated moderate obstructive sleep apnea with an AHI of 18/h.  O2 nadir was 95%.  She had a CPAP titration study in August 2017 and did well with CPAP of 9 cm.  She has been using her CPAP.  I reviewed her CPAP compliance data from 03/04/2019 through 04/02/2019 which is a total of 30 days, during which time she used her machine every night with percent use days greater than 4 hours at 100%, indicating superb compliance with an extended use on average of 10 hours and 14 minutes, residual AHI borderline at 5.6/h, leak on the low side with a 95th percentile at 3.1 L/min on a pressure of 9 cm with EPR of 3.  Her Epworth sleepiness score is 7 out of 24, fatigue severity score is 46 out of 63.  She reports having longer standing issues falling asleep.  She has not tried melatonin.  She does not like to take Ambien because of side effects including excessive spending and excessive shopping.  She does like to watch TV or use her cell phone and watch videos on her phone while in bed.  She tries to go to bed around 10 but does not fall asleep until 1 AM typically.  Rise time is around 9 or 10 AM typically.  She is buying her supplies  directly from the DME company.  She uses nasal pillows.  Sometimes she feels she has to tighten the headgear and it leaves marks on her face in the morning. I reviewed your virtual visit note from 03/13/2019. She reports a recent bout of vertigo about 3 weeks ago.  Previously:   02/12/2015: 53 year old right-handed woman with an underlying medical history of vertigo, hyperlipidemia, hypertension, rhinitis, depression, obesity, restless leg syndrome and PLMS, who had a sleep study in 2010 which showed mild to moderate obstructive sleep apnea and she was placed on CPAP treatment. She uses CPAP for a few months but stopped using it. She found it hard to use and found it difficult to tolerate. She stopped using it for about 4 years but recently tried it a few more times, most recently some 3 weeks ago. I reviewed her prior sleep study results from 11/12/2008 which was a diagnostic polysomnogram. Sleep efficiency was 68.7%. Sleep latency was 17.5 minutes. She had an increased percentage of stage II sleep, a mildly decreased percentage of slow-wave sleep, an increased percentage of stage I sleep and near absence of REM sleep. Total AHI was 13.9 per hour, rising to 16.6 per hour during supine sleep. Lowest oxygen saturation was 87%. She was invited back for sleep study with CPAP. She had this on 12/06/2008: Sleep efficiency was 96.3%, sleep latency was quick at 1.5  minutes. REM percentage was 8.4%, she had an increased percentage of slow-wave sleep, she was treated with CPAP from 4 cm to 11 cm. On the final pressure her AHI was 1.8 per hour. She was prescribed CPAP therapy at 11 cm. I reviewed a CPAP compliance download from 02/09/2009 through 03/08/2009 which is a total of 28 days during which time she used her machine 21 days with percent used days greater than 4 hours at 46%, indicating suboptimal compliance, residual AHI was 0.9, leak was low, set pressure at 11 cm. She has some restless leg symptoms. Her sleep  studies did show some mild PLMS. At the time of her previous study she weighed 198 pounds. She has gained weight, particularly started gaining weight several years ago after her father committed suicide in 2008. She lost her mother last year in November. She is on Lexapro generic for her depression which is working fairly well for her. She is also on Wellbutrin XL 300 mg daily. She had tried weight loss medication in the past but she had side effects including palpitations. She did lose some weight on the medication. She would like to be able to use something else that does not cause her side effects. She has no overt family history of obstructive sleep apnea. Her husband has been diagnosed with sleep apnea and uses a CPAP machine and has pushed her to be reevaluated.  Her Epworth sleepiness score is 12 out of 24 today, her fatigue sco she is increasing her water intake. She is a nonsmoker. She drinks alcohol very rarely.  I reviewed your office note from 01/04/2015 which you kindly included.  Her Past Medical History Is Significant For: Past Medical History:  Diagnosis Date  . Anal skin tag   . Depression   . Diverticulosis, sigmoid   . Dyslipidemia   . GERD (gastroesophageal reflux disease)   . Hemorrhoids    internal and external  . History of diverticulitis    10/ 2016  . History of galactorrhea   . Hypertension   . IBS (irritable bowel syndrome)   . PONV (postoperative nausea and vomiting)    severe  . Rash    elbows and behind knees  . Restless leg syndrome   . Sleep apnea    cpap sometimes  . Wears glasses     Her Past Surgical History Is Significant For: Past Surgical History:  Procedure Laterality Date  . BAND HEMORRHOIDECTOMY  2012   Dr Earlean Shawl  . CESAREAN SECTION  1994  . COLONOSCOPY  06/18/2011  . CYSTO/  BILATERAL PYELOGRAM RETROGRADES/  TRANSOBTURATOR SLING  03/08/2006  . EXCISION OF SKIN TAG N/A 02/13/2016   Procedure: ANAL SKIN TAG EXCISION;  Surgeon: [name],  MD;  Location: [facility];  Service: General;  Laterality: N/A;  . LAPAROSCOPIC CHOLECYSTECTOMY  05/22/2001    Her Family History Is Significant For: Family History  Problem Relation Ross of Onset  . Cancer Mother        Carcnoid tumors  . Diabetes Mother   . Hypertension Mother   . Atrial fibrillation Mother   . Diabetes Father   . Skin cancer Father   . Suicidality Father   . Hypertension Father   . Diabetes Sister   . Diabetes Sister   . Breast cancer Other        materanl great aunt    Her Social History Is Significant For: Social History   Socioeconomic History  . Marital status: Married  Spouse name: [name]  . Number of children: 2  . Years of education: Therapist, sports  . Highest education level: Not on file  Occupational History  . Occupation: Therapist, sports  Social Needs  . Financial resource strain: Not on file  . Food insecurity    Worry: Not on file    Inability: Not on file  . Transportation needs    Medical: Not on file    Non-medical: Not on file  Tobacco Use  . Smoking status: Never Smoker  . Smokeless tobacco: Never Used  Substance and Sexual Activity  . Alcohol use: No    Alcohol/week: 0.0 standard drinks  . Drug use: No  . Sexual activity: Not on file  Lifestyle  . Physical activity    Days per week: Not on file    Minutes per session: Not on file  . Stress: Not on file  Relationships  . Social Herbalist on phone: Not on file    Gets together: Not on file    Attends religious service: Not on file    Active member of club or organization: Not on file    Attends meetings of clubs or organizations: Not on file    Relationship status: Not on file  Other Topics Concern  . Not on file  Social History Narrative   Occasionally drinks coffee    Her Allergies Are:  No Known Allergies:   Her Current Medications Are:  Outpatient Encounter Medications as of 04/04/2019  Medication Sig  . buPROPion (WELLBUTRIN XL) 300 MG 24 hr tablet Take  300 mg by mouth every morning.   . butalbital-acetaminophen-caffeine (ESGIC) 50-325-40 MG tablet Take by mouth 2 (two) times daily as needed for headache.  . carvedilol (COREG) 6.25 MG tablet Take 6.25 mg by mouth 2 (two) times daily with a meal.  . dicyclomine (BENTYL) 10 MG capsule Take 10 mg by mouth daily as needed for spasms.  Marland Kitchen escitalopram (LEXAPRO) 20 MG tablet Take 20 mg by mouth every morning.   Marland Kitchen ESTRADIOL PO Take by mouth.  . fenofibrate micronized (LOFIBRA) 200 MG capsule Take 200 mg by mouth daily before breakfast.   . losartan (COZAAR) 100 MG tablet Take 100 mg by mouth every morning.   . meclizine (ANTIVERT) 25 MG tablet Take 1 tablet (25 mg total) by mouth 3 (three) times daily as needed for dizziness.  . MEDROXYPROGESTERONE ACETATE PO Take by mouth.  . Meloxicam (MOBIC PO) Take by mouth.  Marland Kitchen omeprazole (PRILOSEC) 20 MG capsule Take 20 mg by mouth daily.  Marland Kitchen triamterene-hydrochlorothiazide (MAXZIDE-25) 37.5-25 MG per tablet Take 0.5 tablets by mouth every morning.   . valACYclovir (VALTREX) 1000 MG tablet Take 1,000 mg by mouth 2 (two) times daily as needed.   . verapamil (CALAN-SR) 240 MG CR tablet Take 240 mg by mouth 2 (two) times daily.   Marland Kitchen zolpidem (AMBIEN) 10 MG tablet Take 10 mg by mouth at bedtime as needed for sleep.  . [DISCONTINUED] carvedilol (COREG) 3.125 MG tablet Take 3.125 mg by mouth 2 (two) times daily with a meal.  . [DISCONTINUED] HYDROcodone-acetaminophen (NORCO/VICODIN) 5-325 MG tablet Take 1 tablet by mouth every 4 (four) hours as needed.  . [DISCONTINUED] hydrOXYzine (ATARAX/VISTARIL) 25 MG tablet Take 25 mg by mouth 3 (three) times daily as needed.  . [DISCONTINUED] hyoscyamine (LEVSIN SL) 0.125 MG SL tablet Take 0.125 mg by mouth every 4 (four) hours as needed.   . [DISCONTINUED] norethindrone (MICRONOR,CAMILA,ERRIN) 0.35 MG tablet Take 1 tablet  by mouth every morning.   . [DISCONTINUED] pantoprazole (PROTONIX) 20 MG tablet Take 1 tablet (20 mg total) by  mouth daily. (Patient taking differently: Take 20 mg by mouth every morning. )  . [DISCONTINUED] predniSONE (DELTASONE) 10 MG tablet Take 40 mg by mouth daily with breakfast.   No facility-administered encounter medications on file as of 04/04/2019.   :  Review of Systems:  Out of a complete 14 point review of systems, all are reviewed and negative with the exception of these symptoms as listed below: Review of Systems  Neurological:       Pt presents today to discuss her cpap. Pt reports that she is not sleeping well with her cpap.  Epworth Sleepiness Scale 0= would never doze 1= slight chance of dozing 2= moderate chance of dozing 3= high chance of dozing  Sitting and reading: 3 Watching TV: 1 Sitting inactive in a public place (ex. Theater or meeting): 0 As a passenger in a car for an hour without a break: 0 Lying down to rest in the afternoon: 3 Sitting and talking to someone: 0 Sitting quietly after lunch (no alcohol): 0 In a car, while stopped in traffic: 0 Total: 7     Objective:  Neurological Exam  Physical Exam Physical Examination:   Vitals:   04/04/19 1518  BP: (!) 141/96  Pulse: 91   General Examination: The patient is a very pleasant 53 y.o. female in no acute distress. She appears well-developed and well-nourished and well groomed.   HEENT: Normocephalic, atraumatic, pupils are equal, round and reactive to light, Extraocular tracking is well preserved, face is symmetric, hearing is grossly intact, airway examination reveals no significant mouth dryness, adequate dental hygiene, mild to moderate airway crowding, small airway entry, Mallampati class II.  Tonsils are small.  Tongue protrudes centrally in palate elevates symmetrically.  She has no carotid bruits.  Chest: Clear to auscultation without wheezing, rhonchi or crackles noted.  Heart: S1+S2+0, regular and normal without murmurs, rubs or gallops noted.   Abdomen: Soft, non-tender and  non-distended with normal bowel sounds appreciated on auscultation.  Extremities: There is no pitting edema in the distal lower extremities bilaterally.   Skin: Warm and dry without trophic changes noted.   Musculoskeletal: exam reveals no obvious joint deformities, tenderness or joint swelling or erythema.   Neurologically:  Mental status: The patient is awake, alert and oriented in all 4 spheres. Her immediate and remote memory, attention, language skills and fund of knowledge are appropriate. There is no evidence of aphasia, agnosia, apraxia or anomia. Speech is clear with normal prosody and enunciation. Thought process is linear. Mood is normal and affect is normal.  Cranial nerves II - XII are as described above under HEENT exam.  Motor exam: Normal bulk, strength and tone is noted. There is no tremor. Romberg is negative. Fine motor skills and coordination: grossly intact.  Cerebellar testing: No dysmetria or intention tremor. There is no truncal or gait ataxia.  Sensory exam: intact to light touch.  Gait, station and balance: She stands easily. No veering to one side is noted. No leaning to one side is noted. Posture is Ross-appropriate and stance is narrow based. Gait shows normal stride length and normal pace. No problems turning are noted. Tandem walk is unremarkable.   Assessment and Plan:   In summary, NARISSA CREVELING is a very pleasant 53 year old female with an underlying medical history of vertigo, hyperlipidemia, hypertension, rhinitis, depression, obesity, restless leg syndrome and  PLMS, who Presents for reevaluation of her obstructive sleep apnea which was in the moderate range.  She had a baseline sleep study on 12/03/2015 and a subsequent CPAP titration study on 01/06/2016.  She has been on CPAP therapy and is compliant with treatment.  She is commended for her treatment adherence.  She still struggles with difficulty falling asleep.  We talked about the importance of  maintaining healthy sleep habits and good sleep hygiene. She is encouraged to try melatonin at night. I suggested we increase her set pressure from 9 cm to 10 cm at this time.  Her residual AHI is borderline.  She is advised to follow-up in 6 months to see the nurse practitioner. I answered all her questions today and the patient was in agreement.  Thank you very much for allowing me to participate in the care of this nice patient. If I can be of any further assistance to you please do not hesitate to call me at [phone].  Sincerely,   [name] Age, MD, PhD

## 2019-04-04 NOTE — Progress Notes (Signed)
Order for increase of cpap pressure sent to [facility] via community message. Confirmation received that the order transmitted was successful.

## 2019-04-04 NOTE — Patient Instructions (Signed)
Please continue using your CPAP regularly. While your insurance requires that you use CPAP at least 4 hours each night on 70% of the nights, I recommend, that you not skip any nights and use it throughout the night if you can. Getting used to CPAP and staying with the treatment long term does take time and patience and discipline. Untreated obstructive sleep apnea when it is moderate to severe can have an adverse impact on cardiovascular health and raise her risk for heart disease, arrhythmias, hypertension, congestive heart failure, stroke and diabetes. Untreated obstructive sleep apnea causes sleep disruption, nonrestorative sleep, and sleep deprivation. This can have an impact on your day to day functioning and cause daytime sleepiness and impairment of cognitive function, memory loss, mood disturbance, and problems focussing. Using CPAP regularly can improve these symptoms.   You can try Melatonin at night for sleep: take 1 mg to 3 mg, one to 2 hours before your bedtime. You can go up to 5 mg if needed. It is over the counter and comes in pill form, chewable form and spray, if you prefer.    As discussed, we will increase your CPAP pressure to 10 cm at this time.  We can see you back in 6 months for sleep apnea check up, and if you continue to do well on CPAP I will see you once a year thereafter.   Please try to avoid watching TV while in bed or watching videos on your cell phone prior to sleep.

## 2019-06-05 ENCOUNTER — Encounter: Payer: Self-pay | Admitting: Gastroenterology

## 2019-07-05 ENCOUNTER — Ambulatory Visit: Payer: Self-pay | Admitting: Gastroenterology

## 2019-07-05 ENCOUNTER — Other Ambulatory Visit: Payer: Self-pay

## 2019-07-05 ENCOUNTER — Encounter: Payer: Self-pay | Admitting: Gastroenterology

## 2019-07-05 VITALS — BP 134/82 | HR 76 | Temp 98.4°F | Ht 66.0 in | Wt 232.4 lb

## 2019-07-05 DIAGNOSIS — K581 Irritable bowel syndrome with constipation: Secondary | ICD-10-CM

## 2019-07-05 DIAGNOSIS — K219 Gastro-esophageal reflux disease without esophagitis: Secondary | ICD-10-CM

## 2019-07-05 DIAGNOSIS — R140 Abdominal distension (gaseous): Secondary | ICD-10-CM

## 2019-07-05 MED ORDER — DICYCLOMINE HCL 10 MG PO CAPS: ORAL_CAPSULE | ORAL | 3 refills | Status: DC

## 2019-07-05 MED ORDER — LINACLOTIDE 72 MCG PO CAPS: 72.0000 ug | ORAL_CAPSULE | Freq: Every day | ORAL | 0 refills | Status: DC

## 2019-07-05 NOTE — Patient Instructions (Addendum)
If you are age 54 or older, your body mass index should be between 23-30. Your Body mass index is 37.51 kg/m. If this is out of the aforementioned range listed, please consider follow up with your Primary Care Provider.  If you are age 31 or younger, your body mass index should be between 19-25. Your Body mass index is 37.51 kg/m. If this is out of the aformentioned range listed, please consider follow up with your Primary Care Provider.   Continue Prilosec 20mg  once daily.  We have sent the following medications to your pharmacy for you to pick up at your convenience: Bentyl 10mg : Take once to twice daily as needed  We have given you samples of the following medication to take: Linzess 72 mcg: Take daily in the morning  We are giving you a Low FOD-MAP diet to follow.  You will be due for your next colonoscopy in 2023. We will let you know when it is time to schedule.  Thank you for entrusting me with your care and for choosing [facility] [address], Dr. Rustburg Cellar

## 2019-07-05 NOTE — Progress Notes (Signed)
HPI :  54 year old female with a history of IBS C, hemorrhoids, GERD, previously seen in our office last in April 2017, here for reassessment of her bowel function and reflux..  She has had longstanding constipation.  She has been tried on MiraLAX and Linzess in the past.  Linzess worked initially quite well for her however then led to severe diarrhea, she thought it was too strong for her and stopped it.  We had recommended a trial of a fiber supplement at her last visit, she states that did not help her too much and ultimately stopped taking it.  She is currently taking MiraLAX as needed.  She has a bowel movement perhaps every other day or so.  She continues to have a lot of bloating and distention of her abdomen from gas that bothers her periodically.  She does find some benefit from dicyclomine when she takes it, although she does not take it too frequently.  She states that she does have reliable relief of her symptoms with a bowel movement.  She denies any blood in her stools.  She feels like sometimes she can evacuate herself completely when she tries to use the bathroom and needs to have multiple trips to get everything out.  She occasionally does pass very hard stools.  She otherwise previously was on Protonix 40 mg once a day and Zantac at night for reflux.  Over time her regimen is changed and she has been decreased to omeprazole 20 mg a day.  This generally works well to control her reflux symptoms.  She denies any dysphagia.  She is never had any prior tobacco history or family history of esophagus cancer.  Her mother had a history of carcinoid tumor of the bowel.  She is never had a prior EGD.  She also has had prior occurrence of diverticulitis 4 times in the past. She has been offered surgery but declined at this time. No FH of CRC.   Colonoscopy 05/2011 - diverticulosis, otherwise normal. No adenomas.   Labs 02/2019 - Hgb 13.0, plt 416, WBC 6.6     Past Medical History:   Diagnosis Date  . Anal skin tag   . Depression   . Diverticulosis, sigmoid   . Dyslipidemia   . GERD (gastroesophageal reflux disease)   . Hemorrhoids    internal and external  . History of diverticulitis    10/ 2016  . History of galactorrhea   . Hypertension   . IBS (irritable bowel syndrome)   . PONV (postoperative nausea and vomiting)    severe  . Rash    elbows and behind knees  . Restless leg syndrome   . Sleep apnea    cpap sometimes  . Wears glasses      Past Surgical History:  Procedure Laterality Date  . BAND HEMORRHOIDECTOMY  2012   Dr Earlean Shawl  . CESAREAN SECTION  1994  . COLONOSCOPY  06/18/2011  . CYSTO/  BILATERAL PYELOGRAM RETROGRADES/  TRANSOBTURATOR SLING  03/08/2006  . EXCISION OF SKIN TAG N/A 02/13/2016   Procedure: ANAL SKIN TAG EXCISION;  Surgeon: [name], MD;  Location: [facility];  Service: General;  Laterality: N/A;  . LAPAROSCOPIC CHOLECYSTECTOMY  05/22/2001   Family History  Problem Relation Age of Onset  . Cancer Mother        Carcnoid tumors  . Diabetes Mother   . Hypertension Mother   . Atrial fibrillation Mother   . Diabetes Father   . Skin  cancer Father   . Suicidality Father   . Hypertension Father   . Diabetes Sister   . Diabetes Sister   . Breast cancer Other        materanl great aunt   Social History   Tobacco Use  . Smoking status: Never Smoker  . Smokeless tobacco: Never Used  Substance Use Topics  . Alcohol use: No    Alcohol/week: 0.0 standard drinks  . Drug use: No   Current Outpatient Medications  Medication Sig Dispense Refill  . buPROPion (WELLBUTRIN XL) 300 MG 24 hr tablet Take 300 mg by mouth every morning.     . butalbital-acetaminophen-caffeine (ESGIC) 50-325-40 MG tablet Take by mouth 2 (two) times daily as needed for headache.    . carvedilol (COREG) 6.25 MG tablet Take 6.25 mg by mouth 2 (two) times daily with a meal.    . dicyclomine (BENTYL) 10 MG capsule Take 10 mg by mouth  daily as needed for spasms.    Marland Kitchen escitalopram (LEXAPRO) 20 MG tablet Take 20 mg by mouth every morning.     Marland Kitchen ESTRADIOL PO Take by mouth.    . fenofibrate micronized (LOFIBRA) 200 MG capsule Take 200 mg by mouth daily before breakfast.   4  . losartan (COZAAR) 100 MG tablet Take 100 mg by mouth every morning.   3  . meclizine (ANTIVERT) 25 MG tablet Take 1 tablet (25 mg total) by mouth 3 (three) times daily as needed for dizziness. 30 tablet 0  . MEDROXYPROGESTERONE ACETATE PO Take by mouth.    . Meloxicam (MOBIC PO) Take by mouth.    Marland Kitchen omeprazole (PRILOSEC) 20 MG capsule Take 20 mg by mouth daily.    Marland Kitchen triamterene-hydrochlorothiazide (MAXZIDE-25) 37.5-25 MG per tablet Take 0.5 tablets by mouth every morning.     . valACYclovir (VALTREX) 1000 MG tablet Take 1,000 mg by mouth 2 (two) times daily as needed.     . verapamil (CALAN-SR) 240 MG CR tablet Take 240 mg by mouth 2 (two) times daily.   5  . zolpidem (AMBIEN) 10 MG tablet Take 10 mg by mouth at bedtime as needed for sleep.     No current facility-administered medications for this visit.   No Known Allergies   Review of Systems: All systems reviewed and negative except where noted in HPI.   Lab Results  Component Value Date   WBC 6.6 03/08/2019   HGB 13.0 03/08/2019   HCT 38.5 03/08/2019   MCV 88.1 03/08/2019   PLT 416 (H) 03/08/2019    Lab Results  Component Value Date   CREATININE 0.65 03/08/2019   BUN 25 (H) 03/08/2019   NA 136 03/08/2019   K 4.0 03/08/2019   CL 104 03/08/2019   CO2 22 03/08/2019    Lab Results  Component Value Date   ALT 19 03/07/2015   AST 16 03/07/2015   ALKPHOS 35 (L) 03/07/2015   BILITOT 0.4 03/07/2015     Physical Exam: Temp 98.4 F (36.9 C)   Wt 232 lb 6 oz (105.4 kg)   BMI 37.51 kg/m  Constitutional: Pleasant,well-developed, female in no acute distress. HEENT: Normocephalic and atraumatic. Conjunctivae are normal. No scleral icterus. Neck supple.  Cardiovascular: Normal rate,  regular rhythm.  Pulmonary/chest: Effort normal and breath sounds normal. No wheezing, rales or rhonchi. Abdominal: Soft, nondistended, nontender.  There are no masses palpable.  Extremities: no edema Lymphadenopathy: No cervical adenopathy noted. Neurological: Alert and oriented to person place and time. Skin: Skin  is warm and dry. No rashes noted. Psychiatric: Normal mood and affect. Behavior is normal.   ASSESSMENT AND PLAN: 54 year old female here for assessment of the following issues:  IBS-C / bloating - we have not seen her in the past almost 4 years, trials of Linzess that dosed at 145 mcg a day in the past have been too strong.  She does not like taking MiraLAX routinely, fiber supplementation unfortunately has not helped much.  She continues to have a lot of bloating.  We discussed options to treat this.  I would recommend a trial of lower dose Linzess at 72 mcg a day and see if this provides benefit without being too strong.  She was agreeable to a trial and I provided is free sample for her.  If this works for her asked her to call me back in a couple weeks and we can give her a prescription for it.  Bentyl helps her reliably, I recommend she use 10 mg tab, 1-2 tabs every 8 hours as needed.  I encouraged her to use this if she is having particularly painful cramps, she does not use it too often.  Otherwise to help reduce her bloating, hopefully treatment of the constipation will help this more than anything, however I also counseled her on a low FODMAP diet to see if that might help reduce her gas as well.  We may consider a probiotic moving forward as well if bloating persists.  She is due for her screening colonoscopy in January 2023. Labs assuring without anemia.  She agreed with the plan  GERD - doing pretty well with Prilosec 20 mg a day.  Previously was on higher dose Protonix and Zantac for control of symptoms.  I do not feel strongly that she warrants an EGD right now, she has no  alarm symptoms, no significant risk factors for Barrett's esophagus.  We did discuss long-term risks and benefits of chronic PPI use.  Over time recommend the lowest dose needed to control symptoms or use of an alternative.  Unfortunately H2 blocker monotherapy has not provided benefit.  She will continue Prilosec 20 mg a day as needed moving forward.  She should follow-up with [place] once yearly for these issues, or sooner with additional concerns.   Evergreen Cellar, MD [facility]

## 2019-07-17 ENCOUNTER — Other Ambulatory Visit: Payer: Self-pay

## 2019-07-17 ENCOUNTER — Telehealth: Payer: Self-pay | Admitting: Gastroenterology

## 2019-07-17 MED ORDER — LINACLOTIDE 72 MCG PO CAPS: 72.0000 ug | ORAL_CAPSULE | Freq: Every day | ORAL | 3 refills | Status: DC

## 2019-07-17 NOTE — Progress Notes (Signed)
Script sent to pharmacy.

## 2019-08-24 NOTE — Telephone Encounter (Signed)
complete

## 2019-10-02 ENCOUNTER — Ambulatory Visit: Payer: Self-pay | Admitting: Adult Health

## 2019-12-19 ENCOUNTER — Other Ambulatory Visit: Payer: Self-pay

## 2019-12-19 ENCOUNTER — Inpatient Hospital Stay
Admission: EM | Admit: 2019-12-19 | Discharge: 2019-12-20 | DRG: 310 | Disposition: A | Discharge status: Home / Self Care | Payer: Self-pay | Attending: Internal Medicine | Admitting: Internal Medicine

## 2019-12-19 ENCOUNTER — Encounter: Payer: Self-pay | Admitting: Emergency Medicine

## 2019-12-19 ENCOUNTER — Emergency Department: Payer: Self-pay

## 2019-12-19 DIAGNOSIS — Z800 Family history of malignant neoplasm of digestive organs: Secondary | ICD-10-CM

## 2019-12-19 DIAGNOSIS — Z791 Long term (current) use of non-steroidal anti-inflammatories (NSAID): Secondary | ICD-10-CM

## 2019-12-19 DIAGNOSIS — Z8249 Family history of ischemic heart disease and other diseases of the circulatory system: Secondary | ICD-10-CM

## 2019-12-19 DIAGNOSIS — R0602 Shortness of breath: Secondary | ICD-10-CM

## 2019-12-19 DIAGNOSIS — Z803 Family history of malignant neoplasm of breast: Secondary | ICD-10-CM

## 2019-12-19 DIAGNOSIS — Z808 Family history of malignant neoplasm of other organs or systems: Secondary | ICD-10-CM

## 2019-12-19 DIAGNOSIS — Z833 Family history of diabetes mellitus: Secondary | ICD-10-CM

## 2019-12-19 DIAGNOSIS — I10 Essential (primary) hypertension: Secondary | ICD-10-CM

## 2019-12-19 DIAGNOSIS — Z79899 Other long term (current) drug therapy: Secondary | ICD-10-CM

## 2019-12-19 DIAGNOSIS — I4891 Unspecified atrial fibrillation: Secondary | ICD-10-CM | POA: Diagnosis present

## 2019-12-19 DIAGNOSIS — K589 Irritable bowel syndrome without diarrhea: Secondary | ICD-10-CM | POA: Diagnosis present

## 2019-12-19 DIAGNOSIS — G4733 Obstructive sleep apnea (adult) (pediatric): Secondary | ICD-10-CM | POA: Diagnosis present

## 2019-12-19 DIAGNOSIS — G2581 Restless legs syndrome: Secondary | ICD-10-CM | POA: Diagnosis present

## 2019-12-19 DIAGNOSIS — K5730 Diverticulosis of large intestine without perforation or abscess without bleeding: Secondary | ICD-10-CM | POA: Diagnosis present

## 2019-12-19 DIAGNOSIS — F329 Major depressive disorder, single episode, unspecified: Secondary | ICD-10-CM | POA: Diagnosis present

## 2019-12-19 DIAGNOSIS — I480 Paroxysmal atrial fibrillation: Principal | ICD-10-CM | POA: Diagnosis present

## 2019-12-19 DIAGNOSIS — Z20822 Contact with and (suspected) exposure to covid-19: Secondary | ICD-10-CM | POA: Diagnosis present

## 2019-12-19 DIAGNOSIS — D649 Anemia, unspecified: Secondary | ICD-10-CM | POA: Diagnosis present

## 2019-12-19 DIAGNOSIS — K219 Gastro-esophageal reflux disease without esophagitis: Secondary | ICD-10-CM | POA: Diagnosis present

## 2019-12-19 DIAGNOSIS — E785 Hyperlipidemia, unspecified: Secondary | ICD-10-CM | POA: Diagnosis present

## 2019-12-19 DIAGNOSIS — R002 Palpitations: Secondary | ICD-10-CM

## 2019-12-19 HISTORY — DX: Personal history of other medical treatment: Z92.89

## 2019-12-19 HISTORY — DX: Morbid (severe) obesity due to excess calories: E66.01

## 2019-12-19 LAB — CBC
HCT: 36.3 % (ref 36.0–46.0)
Hemoglobin: 13 g/dL (ref 12.0–15.0)
MCH: 30.8 pg (ref 26.0–34.0)
MCHC: 35.8 g/dL (ref 30.0–36.0)
MCV: 86 fL (ref 80.0–100.0)
Platelets: 538 10*3/uL — ABNORMAL HIGH (ref 150–400)
RBC: 4.22 MIL/uL (ref 3.87–5.11)
RDW: 13.2 % (ref 11.5–15.5)
WBC: 10.2 10*3/uL (ref 4.0–10.5)
nRBC: 0 % (ref 0.0–0.2)

## 2019-12-19 LAB — BASIC METABOLIC PANEL
Anion gap: 15 (ref 5–15)
BUN: 17 mg/dL (ref 6–20)
CO2: 20 mmol/L — ABNORMAL LOW (ref 22–32)
Calcium: 9.5 mg/dL (ref 8.9–10.3)
Chloride: 102 mmol/L (ref 98–111)
Creatinine, Ser: 0.77 mg/dL (ref 0.44–1.00)
GFR calc Af Amer: >60 mL/min (ref >60)
GFR calc non Af Amer: >60 mL/min (ref >60)
Glucose, Bld: 129 mg/dL — ABNORMAL HIGH (ref 70–99)
Potassium: 3.7 mmol/L (ref 3.5–5.1)
Sodium: 137 mmol/L (ref 135–145)

## 2019-12-19 LAB — TROPONIN I (HIGH SENSITIVITY): Troponin I (High Sensitivity): 9 ng/L (ref <18)

## 2019-12-19 LAB — PROTIME-INR
INR: 0.9 (ref 0.8–1.2)
Prothrombin Time: 11.8 seconds (ref 11.4–15.2)

## 2019-12-19 LAB — TSH: TSH: 3.607 u[IU]/mL (ref 0.350–4.500)

## 2019-12-19 LAB — MAGNESIUM: Magnesium: 1.5 mg/dL — ABNORMAL LOW (ref 1.7–2.4)

## 2019-12-19 LAB — SARS CORONAVIRUS 2 BY RT PCR (HOSPITAL ORDER, PERFORMED IN ~~LOC~~ HOSPITAL LAB): SARS Coronavirus 2: NEGATIVE

## 2019-12-19 MED ORDER — MECLIZINE HCL 25 MG PO TABS
25.0000 mg | ORAL_TABLET | Freq: Three times a day (TID) | ORAL | Status: DC | PRN
  Filled 2019-12-19: qty 1

## 2019-12-19 MED ORDER — FENOFIBRATE 160 MG PO TABS
160.0000 mg | ORAL_TABLET | Freq: Every day | ORAL | Status: DC
  Administered 2019-12-20 (×2): 160 mg via ORAL
  Filled 2019-12-19 (×2): qty 1

## 2019-12-19 MED ORDER — ESCITALOPRAM OXALATE 10 MG PO TABS
20.0000 mg | ORAL_TABLET | Freq: Every morning | ORAL | Status: DC
  Filled 2019-12-19: qty 2

## 2019-12-19 MED ORDER — LOSARTAN POTASSIUM 50 MG PO TABS
100.0000 mg | ORAL_TABLET | Freq: Every morning | ORAL | Status: DC
  Administered 2019-12-20: 100 mg via ORAL
  Filled 2019-12-19: qty 2

## 2019-12-19 MED ORDER — PANTOPRAZOLE SODIUM 40 MG PO TBEC
40.0000 mg | DELAYED_RELEASE_TABLET | Freq: Every day | ORAL | Status: DC
  Administered 2019-12-20 (×2): 40 mg via ORAL
  Filled 2019-12-19 (×2): qty 1

## 2019-12-19 MED ORDER — DILTIAZEM LOAD VIA INFUSION
20.0000 mg | Freq: Once | INTRAVENOUS | Status: AC
  Administered 2019-12-19: 20 mg via INTRAVENOUS
  Filled 2019-12-19: qty 20

## 2019-12-19 MED ORDER — POTASSIUM CHLORIDE 20 MEQ PO PACK
40.0000 meq | PACK | Freq: Once | ORAL | Status: AC
  Administered 2019-12-20: 40 meq via ORAL
  Filled 2019-12-19: qty 2

## 2019-12-19 MED ORDER — LINACLOTIDE 72 MCG PO CAPS
72.0000 ug | ORAL_CAPSULE | Freq: Every day | ORAL | Status: DC
  Filled 2019-12-19 (×2): qty 1

## 2019-12-19 MED ORDER — APIXABAN 5 MG PO TABS
5.0000 mg | ORAL_TABLET | Freq: Two times a day (BID) | ORAL | Status: DC
  Administered 2019-12-20 (×2): 5 mg via ORAL
  Filled 2019-12-19 (×2): qty 1

## 2019-12-19 MED ORDER — TRIAMTERENE-HCTZ 37.5-25 MG PO TABS
0.5000 | ORAL_TABLET | Freq: Every morning | ORAL | Status: DC
  Filled 2019-12-19: qty 0.5

## 2019-12-19 MED ORDER — ZOLPIDEM TARTRATE 5 MG PO TABS: 5.0000 mg | ORAL_TABLET | Freq: Every evening | ORAL | Status: DC | PRN

## 2019-12-19 MED ORDER — BUPROPION HCL ER (XL) 150 MG PO TB24
300.0000 mg | ORAL_TABLET | Freq: Every morning | ORAL | Status: DC
  Administered 2019-12-20: 300 mg via ORAL
  Filled 2019-12-19: qty 2

## 2019-12-19 MED ORDER — DILTIAZEM HCL-DEXTROSE 125-5 MG/125ML-% IV SOLN (PREMIX)
5.0000 mg/h | INTRAVENOUS | Status: DC
  Administered 2019-12-19: 5 mg/h via INTRAVENOUS
  Filled 2019-12-19: qty 125

## 2019-12-19 MED ORDER — CARVEDILOL 6.25 MG PO TABS
6.2500 mg | ORAL_TABLET | Freq: Two times a day (BID) | ORAL | Status: DC
  Administered 2019-12-20 (×2): 6.25 mg via ORAL
  Filled 2019-12-19 (×2): qty 1

## 2019-12-19 MED ORDER — ACETAMINOPHEN 325 MG PO TABS
650.0000 mg | ORAL_TABLET | ORAL | Status: DC | PRN
  Administered 2019-12-20: 650 mg via ORAL
  Filled 2019-12-19: qty 2

## 2019-12-19 MED ORDER — ALPRAZOLAM 0.25 MG PO TABS: 0.2500 mg | ORAL_TABLET | Freq: Two times a day (BID) | ORAL | Status: DC | PRN

## 2019-12-19 MED ORDER — ONDANSETRON HCL 4 MG/2ML IJ SOLN: 4.0000 mg | Freq: Four times a day (QID) | INTRAMUSCULAR | Status: DC | PRN

## 2019-12-19 MED ORDER — DILTIAZEM HCL-DEXTROSE 125-5 MG/125ML-% IV SOLN (PREMIX): 5.0000 mg/h | INTRAVENOUS | Status: DC

## 2019-12-19 NOTE — ED Triage Notes (Signed)
Pt arrived via POV with reports of palpitations that started about 30 mins PTA and shortness of breath.  EKG reading AFIB with RVR with no history.  Pt denies any chest pain at this time, but reports discomfort with breathing.

## 2019-12-19 NOTE — ED Provider Notes (Signed)
[facility] Emergency Department Provider Note ____________________________________________   First MD Initiated Contact with Patient 12/19/19 2009     (approximate)  I have reviewed the triage vital signs and the nursing notes.  HISTORY  Chief Complaint Palpitations and Shortness of Breath   HPI [name] Ross is a 54 y.o. female with palpitations and shortness of breath.    History of obesity, HTN and IBS.  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 She self reports having an echocardiogram and a stress test for her heart about 5 years ago, which were normal.  Patient reports about 30 minutes prior to arrival, she developed sudden onset heart palpitations and sensation of shortness of breath while seated on a couch.  She denies any stressful events, she does not smoke cigarettes and does not drink alcohol.  She denies chest pressure, chest pain, syncope or headache.  She reports persistent palpitations sensation since it began without relief.  She has not taken any medications.  She has not taken her home evening medications.  Denies recent illnesses, fevers or medication changes.   Past Medical History:  Diagnosis Date  . Anal skin tag   . Depression   . Diverticulosis   . Diverticulosis, sigmoid   . Dyslipidemia   . GERD (gastroesophageal reflux disease)   . Hemorrhoids    internal and external  . History of diverticulitis    10/ 2016  . History of galactorrhea   . Hypertension   . IBS (irritable bowel syndrome)   . PONV (postoperative nausea and vomiting)    severe  . Rash    elbows and behind knees  . Restless leg syndrome   . Sleep apnea    cpap sometimes  . Wears glasses     Patient Active Problem List   Diagnosis Date Noted  . Atrial fibrillation with rapid ventricular response ([place]) 12/19/2019  . Constipation 06/15/2011  . Internal and external hemorrhoids without complication 95/01/3266  .  Sleep apnea 06/15/2011    Past Surgical History:  Procedure Laterality Date  . BAND HEMORRHOIDECTOMY  2012   Dr Earlean Shawl  . CESAREAN SECTION  1994  . COLONOSCOPY  06/18/2011  . CYSTO/  BILATERAL PYELOGRAM RETROGRADES/  TRANSOBTURATOR SLING  03/08/2006  . EXCISION OF SKIN TAG N/A 02/13/2016   Procedure: ANAL SKIN TAG EXCISION;  Surgeon: [name], MD;  Location: [facility] [place] [facility];  Service: General;  Laterality: N/A;  . LAPAROSCOPIC CHOLECYSTECTOMY  05/22/2001  . SHOULDER SURGERY      Prior to Admission medications   Medication Sig Start Date End Date Taking? Authorizing Provider  buPROPion (WELLBUTRIN XL) 300 MG 24 hr tablet Take 300 mg by mouth every morning.     [provider]  butalbital-acetaminophen-caffeine (ESGIC) 50-325-40 MG tablet Take by mouth 2 (two) times daily as needed for headache.    [provider]  carvedilol (COREG) 6.25 MG tablet Take 6.25 mg by mouth 2 (two) times daily with a meal.    [provider]  dicyclomine (BENTYL) 10 MG capsule Take one tablet by mouth once to twice daily as needed 07/05/19   Armbruster, Carlota Raspberry, MD  escitalopram (LEXAPRO) 20 MG tablet Take 20 mg by mouth every morning.     [provider]  ESTRADIOL PO Take by mouth.    [provider]  fenofibrate micronized (LOFIBRA) 200 MG capsule Take 200 mg by mouth daily before breakfast.  02/04/15  [provider]  linaclotide Rolan Lipa) 72 MCG capsule Take 1 capsule (72 mcg total) by mouth daily before breakfast. 07/17/19   Armbruster, Carlota Raspberry, MD  losartan (COZAAR) 100 MG tablet Take 100 mg by mouth every morning.  12/01/14   [provider]  meclizine (ANTIVERT) 25 MG tablet Take 1 tablet (25 mg total) by mouth 3 (three) times daily as needed for dizziness. 03/08/19   Lilia Pro., MD  MEDROXYPROGESTERONE ACETATE PO Take by mouth.    [provider]  Meloxicam (MOBIC PO) Take by mouth.    [provider]    omeprazole (PRILOSEC) 20 MG capsule Take 20 mg by mouth daily.    [provider]  triamterene-hydrochlorothiazide (MAXZIDE-25) 37.5-25 MG per tablet Take 0.5 tablets by mouth every morning.     [provider]  valACYclovir (VALTREX) 1000 MG tablet Take 1,000 mg by mouth 2 (two) times daily as needed.     [provider]  verapamil (CALAN-SR) 240 MG CR tablet Take 240 mg by mouth 2 (two) times daily.  02/04/15   [provider]  zolpidem (AMBIEN) 10 MG tablet Take 10 mg by mouth at bedtime as needed for sleep.    [provider]    Allergies Patient has no known allergies.  Family History  Problem Relation Age of Onset  . Diabetes Mother   . Hypertension Mother   . Atrial fibrillation Mother   . Colon cancer Mother   . Diabetes Father   . Skin cancer Father   . Suicidality Father   . Hypertension Father   . Diabetes Sister   . Diabetes Sister   . Breast cancer Other        materanl great aunt    Social History Social History   Tobacco Use  . Smoking status: Never Smoker  . Smokeless tobacco: Never Used  Vaping Use  . Vaping Use: Never used  Substance Use Topics  . Alcohol use: No    Alcohol/week: 0.0 standard drinks  . Drug use: No    Review of Systems  Constitutional: No fever/chills Eyes: No visual changes. ENT: No sore throat. Cardiovascular: Denies chest pain.  Positive for heart palpitations. Respiratory: Positive for shortness of breath Gastrointestinal: No abdominal pain.  No nausea, no vomiting.  No diarrhea.  No constipation. Genitourinary: Negative for dysuria. Musculoskeletal: Negative for back pain. Skin: Negative for rash. Neurological: Negative for headaches, focal weakness or numbness.   ____________________________________________   PHYSICAL EXAM:  VITAL SIGNS: ED Triage Vitals  Enc Vitals Group     BP 12/19/19 1951 (!) 137/114     Pulse Rate 12/19/19 1951 85     Resp 12/19/19 1951 16      Temp 12/19/19 1951 99.3 F (37.4 C)     Temp Source 12/19/19 1951 Oral     SpO2 12/19/19 1951 97 %     Weight --      Height --      Head Circumference --      Peak Flow --      Pain Score 12/19/19 2001 0     Pain Loc --      Pain Edu? --      Excl. in [place]? --      Constitutional: Alert and oriented. Well appearing and in no acute distress. Eyes: Conjunctivae are normal. PERRL. EOMI. Head: Atraumatic. Nose: No congestion/rhinnorhea. Mouth/Throat: Mucous membranes are moist.  Oropharynx non-erythematous. Neck: No stridor. No cervical spine tenderness to  palpation. Cardiovascular: Tachycardic and irregularly irregular.. Grossly normal heart sounds.  Good peripheral circulation. Respiratory: Normal respiratory effort.  No retractions. Lungs CTAB. Gastrointestinal: Soft , nondistended, nontender to palpation. No abdominal bruits. No CVA tenderness. Musculoskeletal: No lower extremity tenderness nor edema.  No joint effusions. No signs of acute trauma. Neurologic:  Normal speech and language. No gross focal neurologic deficits are appreciated. No gait instability noted. Skin:  Skin is warm, dry and intact. No rash noted. Psychiatric: Mood and affect are normal. Speech and behavior are normal.  ____________________________________________   LABS (all labs ordered are listed, but only abnormal results are displayed)  Labs Reviewed  BASIC METABOLIC PANEL - Abnormal; Notable for the following components:      Result Value   CO2 20 (*)    Glucose, Bld 129 (*)    All other components within normal limits  CBC - Abnormal; Notable for the following components:   Platelets 538 (*)    All other components within normal limits  SARS CORONAVIRUS 2 BY RT PCR (HOSPITAL ORDER, [place] [facility])  PROTIME-INR  MAGNESIUM  TROPONIN I (HIGH SENSITIVITY)   ____________________________________________  12 Lead EKG Atrial fibrillation, rate of 169 bpm, normal axis.  No  interval changes.  No evidence of acute ischemia.  ____________________________________________  RADIOLOGY  ED MD interpretation: CXR without evidence of acute cardiopulmonary pathology  Official radiology report(s): DG Chest 2 View  Result Date: 12/19/2019 CLINICAL DATA:  Shortness of breath EXAM: CHEST - 2 VIEW COMPARISON:  01/23/2007 FINDINGS: The heart size and mediastinal contours are within normal limits. Both lungs are clear. The visualized skeletal structures are unremarkable. Aortic atherosclerosis. IMPRESSION: No active cardiopulmonary disease. Electronically Signed   By: Donavan Foil M.D.   On: 12/19/2019 20:39    ____________________________________________   PROCEDURES  Procedure(s) performed (including Critical Care):  Procedures  CRITICAL CARE Performed by: Vladimir Crofts   Total critical care time: 40 minutes  Critical care time was exclusive of separately billable procedures and treating other patients.  Critical care was necessary to treat or prevent imminent or life-threatening deterioration.  Critical care was time spent personally by me on the following activities: development of treatment plan with patient and/or surrogate as well as nursing, discussions with consultants, evaluation of patient's response to treatment, examination of patient, obtaining history from patient or surrogate, ordering and performing treatments and interventions, ordering and review of laboratory studies, ordering and review of radiographic studies, pulse oximetry and re-evaluation of patient's condition.  ____________________________________________   INITIAL IMPRESSION / ASSESSMENT AND PLAN / ED COURSE  54 year old woman with history of HTN presents in new onset A. fib requiring diltiazem drip and medical admission.  Initially with rates in the 160s and good blood pressures with this.  She reports symptoms of palpitations and dyspnea, but no chest pain or syncope.  EKG is  nonischemic, and negative high-sensitivity troponin.  Unremarkable blood work.  Single bolus of diltiazem 20 mg with transient improvement of rates, and blood pressure was well tolerated, so started a diltiazem drip with improving rates and symptoms alongside this.  We will admit the patient to hospitalist medicine for further work-up and management of her new onset A. fib with rapid rates.  Clinical Course as of Dec 18 2121  Tue Dec 19, 2019  2108 Reassessed.  Status post diltiazem 20 mg load and drip at 5 mg/h, rate in the low 100s.  Improving symptoms.   [DS]  2122 Spoke with admitting hospitalist,  who agrees to see the patient for admission.   [DS]    Clinical Course User Index [DS] Vladimir Crofts, MD     ____________________________________________   FINAL CLINICAL IMPRESSION(S) / ED DIAGNOSES  Final diagnoses:  New onset a-fib ([place])  Heart palpitations  Shortness of breath     ED Discharge Orders    None       Jermika Olden Tamala Julian   Note:  This document was prepared using Dragon voice recognition software and may include unintentional dictation errors.   Vladimir Crofts, MD 12/19/19 2126

## 2019-12-19 NOTE — H&P (Signed)
[facility] NAME: [name] Ross    MR#:  [id number]  DATE OF BIRTH:  23-Oct-1965  DATE OF ADMISSION:  12/19/2019  PRIMARY CARE PHYSICIAN: Leanna Battles, MD   REQUESTING/REFERRING PHYSICIAN: Vladimir Crofts, MD  CHIEF COMPLAINT:   Chief Complaint  Patient presents with  . Palpitations  . Shortness of Breath    HISTORY OF PRESENT ILLNESS:  [name] Ross  is a 54 y.o. Caucasian female with a known history of hypertension, obstructive sleep apnea on home CPAP, depression and dyslipidemia, who presented to the emergency room with acute onset of palpitations with associated mild dyspnea and chest tightness and was mildly diaphoretic in the ER.  She denied any nausea or vomiting.  No fever or chills.  No history of thyroid disease.  She denied any dysuria, urinary frequency urgency or flank pain.  She declined vaccination for COVID-19.  No fever or chills or recent exposure.  Upon presentation to the emergency room, BMP was within normal.  We added magnesium level and it came back 1.5.  CBC was unremarkable.  COVID-19 PCR came back negative.EKG showed atrial fibrillation with rapid ventricular sponsor 169 with low voltage QRS and nonspecific ST and T wave abnormalities.  Two-view chest [place] showed no acute cardiopulmonary disease.  The patient was given IV Cardizem bolus of 20 mg followed by IV Cardizem drip.  She later converted to normal sinus rhythm.  She will be admitted to a telemetry bed for further evaluation and management.  PAST MEDICAL HISTORY:   Past Medical History:  Diagnosis Date  . Anal skin tag   . Depression   . Diverticulosis   . Diverticulosis, sigmoid   . Dyslipidemia   . GERD (gastroesophageal reflux disease)   . Hemorrhoids    internal and external  . History of diverticulitis    10/ 2016  . History of galactorrhea   . Hypertension   . IBS (irritable bowel syndrome)   . PONV (postoperative nausea and vomiting)    severe  .  Rash    elbows and behind knees  . Restless leg syndrome   . Sleep apnea    cpap sometimes  . Wears glasses     PAST SURGICAL HISTORY:   Past Surgical History:  Procedure Laterality Date  . BAND HEMORRHOIDECTOMY  2012   Dr Earlean Shawl  . CESAREAN SECTION  1994  . COLONOSCOPY  06/18/2011  . CYSTO/  BILATERAL PYELOGRAM RETROGRADES/  TRANSOBTURATOR SLING  03/08/2006  . EXCISION OF SKIN TAG N/A 02/13/2016   Procedure: ANAL SKIN TAG EXCISION;  Surgeon: [name], MD;  Location: [facility];  Service: General;  Laterality: N/A;  . LAPAROSCOPIC CHOLECYSTECTOMY  05/22/2001  . SHOULDER SURGERY      SOCIAL HISTORY:   Social History   Tobacco Use  . Smoking status: Never Smoker  . Smokeless tobacco: Never Used  Substance Use Topics  . Alcohol use: No    Alcohol/week: 0.0 standard drinks    FAMILY HISTORY:   Family History  Problem Relation Age of Onset  . Diabetes Mother   . Hypertension Mother   . Atrial fibrillation Mother   . Colon cancer Mother   . Diabetes Father   . Skin cancer Father   . Suicidality Father   . Hypertension Father   . Diabetes Sister   . Diabetes Sister   . Breast cancer Other        materanl great aunt  DRUG ALLERGIES:  No Known Allergies  REVIEW OF SYSTEMS:   ROS As per history of present illness. All pertinent systems were reviewed above. Constitutional, HEENT, cardiovascular, respiratory, GI, GU, musculoskeletal, neuro, psychiatric, endocrine, integumentary and hematologic systems were reviewed and are otherwise negative/unremarkable except for positive findings mentioned above in the HPI.   MEDICATIONS AT HOME:   Prior to Admission medications   Medication Sig Start Date End Date Taking? Authorizing Provider  buPROPion (WELLBUTRIN XL) 300 MG 24 hr tablet Take 300 mg by mouth every morning.     [provider]  butalbital-acetaminophen-caffeine (ESGIC) 50-325-40 MG tablet Take by mouth 2 (two) times daily as  needed for headache.    [provider]  carvedilol (COREG) 6.25 MG tablet Take 6.25 mg by mouth 2 (two) times daily with a meal.    [provider]  dicyclomine (BENTYL) 10 MG capsule Take one tablet by mouth once to twice daily as needed 07/05/19   Armbruster, Carlota Raspberry, MD  escitalopram (LEXAPRO) 20 MG tablet Take 20 mg by mouth every morning.     [provider]  ESTRADIOL PO Take by mouth.    [provider]  fenofibrate micronized (LOFIBRA) 200 MG capsule Take 200 mg by mouth daily before breakfast.  02/04/15   [provider]  linaclotide (LINZESS) 72 MCG capsule Take 1 capsule (72 mcg total) by mouth daily before breakfast. 07/17/19   Armbruster, Carlota Raspberry, MD  losartan (COZAAR) 100 MG tablet Take 100 mg by mouth every morning.  12/01/14   [provider]  meclizine (ANTIVERT) 25 MG tablet Take 1 tablet (25 mg total) by mouth 3 (three) times daily as needed for dizziness. 03/08/19   Lilia Pro., MD  MEDROXYPROGESTERONE ACETATE PO Take by mouth.    [provider]  Meloxicam (MOBIC PO) Take by mouth.    [provider]  omeprazole (PRILOSEC) 20 MG capsule Take 20 mg by mouth daily.    [provider]  triamterene-hydrochlorothiazide (MAXZIDE-25) 37.5-25 MG per tablet Take 0.5 tablets by mouth every morning.     [provider]  valACYclovir (VALTREX) 1000 MG tablet Take 1,000 mg by mouth 2 (two) times daily as needed.     [provider]  verapamil (CALAN-SR) 240 MG CR tablet Take 240 mg by mouth 2 (two) times daily.  02/04/15   [provider]  zolpidem (AMBIEN) 10 MG tablet Take 10 mg by mouth at bedtime as needed for sleep.    [provider]      VITAL SIGNS:  Blood pressure (!) 161/94, pulse 104, temperature 99.3 F (37.4 C), temperature source Oral, resp. rate 16, SpO2 99 %.  PHYSICAL EXAMINATION:  Physical Exam  GENERAL:  54 y.o.-year-old Caucasian female patient  lying in the bed with no acute distress.  EYES: Pupils equal, round, reactive to light and accommodation. No scleral icterus. Extraocular muscles intact.  HEENT: Head atraumatic, normocephalic. Oropharynx and nasopharynx clear.  NECK:  Supple, no jugular venous distention. No thyroid enlargement, no tenderness.  LUNGS: Normal breath sounds bilaterally, no wheezing, rales,rhonchi or crepitation. No use of accessory muscles of respiration.  CARDIOVASCULAR: Regular rate and rhythm, S1, S2 normal. No murmurs, rubs, or gallops.  ABDOMEN: Soft, nondistended, nontender. Bowel sounds present. No organomegaly or mass.  EXTREMITIES: No pedal edema, cyanosis, or clubbing.  NEUROLOGIC: Cranial nerves II through XII are intact. Muscle strength 5/5 in all extremities. Sensation intact. Gait not checked.  PSYCHIATRIC: The patient is alert  and oriented x 3.  Normal affect and good eye contact. SKIN: No obvious rash, lesion, or ulcer.   LABORATORY PANEL:   CBC Recent Labs  Lab 12/19/19 2004  WBC 10.2  HGB 13.0  HCT 36.3  PLT 538*   ------------------------------------------------------------------------------------------------------------------  Chemistries  Recent Labs  Lab 12/19/19 2004  NA 137  K 3.7  CL 102  CO2 20*  GLUCOSE 129*  BUN 17  CREATININE 0.77  CALCIUM 9.5   ------------------------------------------------------------------------------------------------------------------  Cardiac Enzymes No results for input(s): TROPONINI in the last 168 hours. ------------------------------------------------------------------------------------------------------------------  RADIOLOGY:  DG Chest 2 View  Result Date: 12/19/2019 CLINICAL DATA:  Shortness of breath EXAM: CHEST - 2 VIEW COMPARISON:  01/23/2007 FINDINGS: The heart size and mediastinal contours are within normal limits. Both lungs are clear. The visualized skeletal structures are unremarkable. Aortic atherosclerosis.  IMPRESSION: No active cardiopulmonary disease. Electronically Signed   By: Donavan Foil M.D.   On: 12/19/2019 20:39      IMPRESSION AND PLAN:   1.  New-onset atrial fibrillation with rapid ventricular response. -The patient will be admitted to a progressive cardiac unit bed. -We will continue IV Cardizem drip overnight. -She can later be switched to p.o. Cardizem in a.m. -Electrolytes will be optimized including potassium and magnesium. -We will hold off further Fioricet. -Her CHA2DS2-VASc score is 2.  We will start her on p.o. Eliquis pending cardiology evaluation. -We will check her TSH. -2D echo and a cardiology consult to be obtained. -I notified Dr. Lovena Le about the patient.  2.  Hypomagnesemia which could be contributing to #1. -Magnesium will be replaced.  3.  Hypertension. -We will continue Coreg, Maxide and Cozaar.  We will hold Calan SR for now.  4.  Dyslipidemia. -We will continue fenofibrate.  5.  Depression. -We will continue Lexapro.  6.  DVT prophylaxis. -The patient will be placed on p.o. Eliquis.   All the records are reviewed and case discussed with ED provider. The plan of care was discussed in details with the patient (and family). I answered all questions. The patient agreed to proceed with the above mentioned plan. Further management will depend upon hospital course.   CODE STATUS: Full code  Status is: Inpatient  Remains inpatient appropriate because:Ongoing diagnostic testing needed not appropriate for outpatient work up, Unsafe d/c plan, IV treatments appropriate due to intensity of illness or inability to take PO and Inpatient level of care appropriate due to severity of illness   Dispo: The patient is from: Home              Anticipated d/c is to: Home              Anticipated d/c date is: 2 days              Patient currently is not medically stable to d/c.   TOTAL TIME TAKING CARE OF THIS PATIENT: 55 minutes.    Christel Mormon M.D on  12/19/2019 at 9:37 PM  [facility]   From 7 PM-7 AM, contact night-coverage [email or web address]  CC: Primary care physician; Leanna Battles, MD   Note: This dictation was prepared with Dragon dictation along with smaller phrase technology. Any transcriptional typo errors that result from this process are unintentional.

## 2019-12-19 NOTE — ED Notes (Signed)
Attempted to call report at this time 

## 2019-12-19 NOTE — ED Notes (Addendum)
Pt states after she woke up from nap she had funny feeling in chest, no pain, with SOB. Pt does not have hx of afib. Pt HR 116 ST

## 2019-12-20 ENCOUNTER — Encounter: Payer: Self-pay | Admitting: Family Medicine

## 2019-12-20 ENCOUNTER — Inpatient Hospital Stay
Admit: 2019-12-20 | Discharge: 2019-12-20 | Disposition: A | Discharge status: Home / Self Care | Payer: Self-pay | Attending: Internal Medicine | Admitting: Internal Medicine

## 2019-12-20 DIAGNOSIS — I4891 Unspecified atrial fibrillation: Secondary | ICD-10-CM

## 2019-12-20 DIAGNOSIS — I10 Essential (primary) hypertension: Secondary | ICD-10-CM

## 2019-12-20 LAB — CBC
HCT: 31.5 % — ABNORMAL LOW (ref 36.0–46.0)
Hemoglobin: 11.1 g/dL — ABNORMAL LOW (ref 12.0–15.0)
MCH: 30.3 pg (ref 26.0–34.0)
MCHC: 35.2 g/dL (ref 30.0–36.0)
MCV: 86.1 fL (ref 80.0–100.0)
Platelets: 398 10*3/uL (ref 150–400)
RBC: 3.66 MIL/uL — ABNORMAL LOW (ref 3.87–5.11)
RDW: 13.2 % (ref 11.5–15.5)
WBC: 7.5 10*3/uL (ref 4.0–10.5)
nRBC: 0 % (ref 0.0–0.2)

## 2019-12-20 LAB — ECHOCARDIOGRAM COMPLETE
AR max vel: 3.61 cm2
AV Area VTI: 3.54 cm2
AV Area mean vel: 3.71 cm2
AV Mean grad: 4 mmHg
AV Peak grad: 8.3 mmHg
Ao pk vel: 1.44 m/s
Area-P 1/2: 4.8 cm2
Height: 66 in
S' Lateral: 2.99 cm
Weight: 3852.8 oz

## 2019-12-20 LAB — LIPID PANEL
Cholesterol: 204 mg/dL — ABNORMAL HIGH (ref 0–200)
HDL: 39 mg/dL — ABNORMAL LOW (ref >40)
LDL Cholesterol: UNDETERMINED mg/dL (ref 0–99)
Total CHOL/HDL Ratio: 5.2 RATIO
Triglycerides: 510 mg/dL — ABNORMAL HIGH (ref <150)
VLDL: UNDETERMINED mg/dL (ref 0–40)

## 2019-12-20 LAB — BASIC METABOLIC PANEL
Anion gap: 9 (ref 5–15)
BUN: 16 mg/dL (ref 6–20)
CO2: 25 mmol/L (ref 22–32)
Calcium: 9.2 mg/dL (ref 8.9–10.3)
Chloride: 102 mmol/L (ref 98–111)
Creatinine, Ser: 0.75 mg/dL (ref 0.44–1.00)
GFR calc Af Amer: >60 mL/min (ref >60)
GFR calc non Af Amer: >60 mL/min (ref >60)
Glucose, Bld: 116 mg/dL — ABNORMAL HIGH (ref 70–99)
Potassium: 3.9 mmol/L (ref 3.5–5.1)
Sodium: 136 mmol/L (ref 135–145)

## 2019-12-20 LAB — HIV ANTIBODY (ROUTINE TESTING W REFLEX): HIV Screen 4th Generation wRfx: NONREACTIVE

## 2019-12-20 LAB — LDL CHOLESTEROL, DIRECT: Direct LDL: 66.8 mg/dL (ref 0–99)

## 2019-12-20 MED ORDER — DILTIAZEM HCL ER COATED BEADS 120 MG PO CP24: 240.0000 mg | ORAL_CAPSULE | Freq: Every day | ORAL | Status: DC

## 2019-12-20 MED ORDER — DILTIAZEM HCL ER COATED BEADS 180 MG PO CP24: 180.0000 mg | ORAL_CAPSULE | Freq: Every day | ORAL | 0 refills | Status: DC

## 2019-12-20 MED ORDER — MAGNESIUM SULFATE 2 GM/50ML IV SOLN
2.0000 g | Freq: Once | INTRAVENOUS | Status: AC
  Administered 2019-12-20: 2 g via INTRAVENOUS
  Filled 2019-12-20: qty 50

## 2019-12-20 MED ORDER — CHLORHEXIDINE GLUCONATE CLOTH 2 % EX PADS: 6.0000 | MEDICATED_PAD | Freq: Every day | CUTANEOUS | Status: DC

## 2019-12-20 MED ORDER — DILTIAZEM HCL ER COATED BEADS 180 MG PO CP24
180.0000 mg | ORAL_CAPSULE | Freq: Every day | ORAL | Status: DC
  Administered 2019-12-20: 180 mg via ORAL
  Filled 2019-12-20: qty 1

## 2019-12-20 MED ORDER — APIXABAN 5 MG PO TABS: 5.0000 mg | ORAL_TABLET | Freq: Two times a day (BID) | ORAL | 0 refills | Status: DC

## 2019-12-20 MED ORDER — CARVEDILOL 12.5 MG PO TABS: 12.5000 mg | ORAL_TABLET | Freq: Two times a day (BID) | ORAL | Status: DC

## 2019-12-20 MED ORDER — SODIUM CHLORIDE 0.9% FLUSH
10.0000 mL | INTRAVENOUS | Status: DC | PRN
  Administered 2019-12-20: 10 mL

## 2019-12-20 NOTE — Progress Notes (Signed)
Cardizem gtt stopped per MD order. Pt is currently NSR with HR of 85 on telemetry. I will continue to assess.

## 2019-12-20 NOTE — Discharge Summary (Addendum)
Physician Discharge Summary  Patient ID: [name] Ross MRN: [id number] DOB/AGE: 02-16-1966 54 y.o.  Admit date: 12/19/2019 Discharge date: 12/20/2019  Admission Diagnoses:  Discharge Diagnoses:  Active Problems:   Atrial fibrillation with rapid ventricular response [facility])   Essential hypertension   Discharged Condition: good  Hospital Course:  [name] Ross  is a 53 y.o. Caucasian female with a known history of hypertension, obstructive sleep apnea on home CPAP, depression and dyslipidemia, who presented to the emergency room with acute onset of palpitations with associated mild dyspnea and chest tightness and was mildly diaphoretic. Telemetry showed atrial fibrillation with rapid ventricular response.  She was placed on diltiazem drip.  She was also seen by cardiology.  She converted to sinus rhythm this morning.  Echocardiogram showed ejection fraction 60 to 65%.  She is also placed on Eliquis for anticoagulation.  #1.  Paroxysmal atrial fibrillation with rapid ventricular response. So far has converted to sinus.  Continue Coreg, I have discontinued verapamil, added  diltiazem.  Continue anticoagulation with Eliquis.  2.  Hypomagnesemia. Supplement.  3.  Essential hypertension. Continue home medicines.  4.  Depression. Resume home medicines.   Consults: Cardiology  Significant Diagnostic Studies:  CHEST - 2 VIEW  COMPARISON:  01/23/2007  FINDINGS: The heart size and mediastinal contours are within normal limits. Both lungs are clear. The visualized skeletal structures are unremarkable. Aortic atherosclerosis.  IMPRESSION: No active cardiopulmonary disease.   Electronically Signed   By: Donavan Foil M.D.   On: 12/19/2019 20:39  Echo:  1. Left ventricular ejection fraction, by estimation, is 60 to 65%. The  left ventricle has normal function. The left ventricle has no regional  wall motion abnormalities. Left ventricular diastolic parameters were  normal.   2. Right ventricular systolic function is normal. The right ventricular  size is normal.  3. The mitral valve is normal in structure. Trivial mitral valve  regurgitation.  4. The aortic valve is normal in structure. Aortic valve regurgitation is  not visualized.  Treatments: Diltiazem drip.  Discharge Exam: Blood pressure (!) 158/98, pulse 96, temperature 98.4 F (36.9 C), temperature source Oral, resp. rate 19, height 5\' 6"  (1.676 m), weight (!) 109.2 kg, SpO2 98 %. General appearance: alert and cooperative Resp: clear to auscultation bilaterally Cardio: regular rate and rhythm, S1, S2 normal, no murmur, click, rub or gallop GI: soft, non-tender; bowel sounds normal; no masses,  no organomegaly Extremities: extremities normal, atraumatic, no cyanosis or edema  Disposition: Discharge disposition: 01-Home or Self Care       Discharge Instructions    Amb referral to [facility]   Complete by: As directed    Diet - low sodium heart healthy   Complete by: As directed    Increase activity slowly   Complete by: As directed      Allergies as of 12/20/2019   No Known Allergies     Medication List    TAKE these medications   apixaban 5 MG Tabs tablet Commonly known as: ELIQUIS Take 1 tablet (5 mg total) by mouth 2 (two) times daily.   buPROPion 300 MG 24 hr tablet Commonly known as: WELLBUTRIN XL Take 300 mg by mouth every morning.   carvedilol 6.25 MG tablet Commonly known as: COREG Take 6.25 mg by mouth 2 (two) times daily with a meal.   dicyclomine 10 MG capsule Commonly known as: BENTYL Take one tablet by mouth once to twice daily as needed   escitalopram 20 MG tablet Commonly known  asLoma Sousa Take 20 mg by mouth every morning.   Esgic 50-325-40 MG tablet Generic drug: butalbital-acetaminophen-caffeine Take by mouth 2 (two) times daily as needed for headache.   ESTRADIOL PO Take by mouth.   fenofibrate micronized 200 MG capsule Commonly known as:  LOFIBRA Take 200 mg by mouth daily before breakfast.   linaclotide 72 MCG capsule Commonly known as: Linzess Take 1 capsule (72 mcg total) by mouth daily before breakfast.   losartan 100 MG tablet Commonly known as: COZAAR Take 100 mg by mouth every morning.   meclizine 25 MG tablet Commonly known as: ANTIVERT Take 1 tablet (25 mg total) by mouth 3 (three) times daily as needed for dizziness.   MEDROXYPROGESTERONE ACETATE PO Take by mouth.   MOBIC PO Take by mouth.   omeprazole 20 MG capsule Commonly known as: PRILOSEC Take 20 mg by mouth daily.   triamterene-hydrochlorothiazide 37.5-25 MG tablet Commonly known as: MAXZIDE-25 Take 0.5 tablets by mouth every morning.   valACYclovir 1000 MG tablet Commonly known as: VALTREX Take 1,000 mg by mouth 2 (two) times daily as needed.   verapamil 240 MG CR tablet Commonly known as: CALAN-SR Take 240 mg by mouth 2 (two) times daily.   zolpidem 10 MG tablet Commonly known as: AMBIEN Take 10 mg by mouth at bedtime as needed for sleep.       Follow-up Information    Loel Dubonnet, NP. Go on 01/04/2020.   Specialty: Cardiology Why: appointment at 9:30am Contact information: [address] [place] [address] [phone]        Kate Sable, MD Follow up in 2 week(s).   Specialties: Cardiology, Radiology Contact information: [place] [address] [phone]               Signed: Sharen Hones 12/20/2019, 2:08 PM

## 2019-12-20 NOTE — Consult Note (Signed)
Cardiology Consult    Patient ID: [name] Ross MRN: [id number], DOB/AGE: 54-17-1967   Admit date: 12/19/2019 Date of Consult: 12/20/2019  Primary Physician: [name] Battles, MD Primary Cardiologist: [name] Sable, MD - new Requesting Provider: Elenora Gamma, MD  Patient Profile    [name] Ross is a 54 y.o. female with a history of HTN, HL, OSA, depression, obesity, and GERD, who is being seen today for the evaluation of Afib w/ RVR at the request of Dr. Roosevelt Ross.  Past Medical History   Past Medical History:  Diagnosis Date  . Anal skin tag   . Depression   . Diverticulosis   . Diverticulosis, sigmoid   . Dyslipidemia   . GERD (gastroesophageal reflux disease)   . Hemorrhoids    internal and external  . History of diverticulitis    10/ 2016  . History of galactorrhea   . Hypertension   . IBS (irritable bowel syndrome)   . Morbid obesity ([place])   . PONV (postoperative nausea and vomiting)    severe  . Rash    elbows and behind knees  . Restless leg syndrome   . Sleep apnea    cpap sometimes  . Wears glasses     Past Surgical History:  Procedure Laterality Date  . BAND HEMORRHOIDECTOMY  2012   Dr Earlean Shawl  . CESAREAN SECTION  1994  . COLONOSCOPY  06/18/2011  . CYSTO/  BILATERAL PYELOGRAM RETROGRADES/  TRANSOBTURATOR SLING  03/08/2006  . EXCISION OF SKIN TAG N/A 02/13/2016   Procedure: ANAL SKIN TAG EXCISION;  Surgeon: [name], MD;  Location: [facility];  Service: General;  Laterality: N/A;  . LAPAROSCOPIC CHOLECYSTECTOMY  05/22/2001  . SHOULDER SURGERY       Allergies  No Known Allergies  History of Present Illness    54 y/o ? w/ the above PMH including HTN, HL, depression, GERD, obesity, and OSA.  She says that she saw a cardiologist in [place] several years ago (5-10) and underwent stress testing and echo.  She does not remember what symptoms she might have been having at that time but believes the studies were normal.  There are no reports in  E[facility].  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  She says that dating back to ~ 2008, following the suicide of her father, she fell into a deep depression that was exacerbated by the death of her mother in 65.  In that setting, her activity levels dropped significantly, and she has had DOE when walking up inclines/stairs or over long distances ever since.  She denies any history of chest pain.  She was in her USOH until 7/27, when she was lying on her couch and noted sudden onset of tachypalpitations associated w/ dyspnea.  As symptoms persisted for a few minutes, she drove herself to the [facility] ED, where she was found to be in Afib w/ RVR @ 169 bpm.  With exception of hypomagnesemia (1.5), labs unremarkable w/ nl TSH, lytes, and  HsTrop.  CXR w/o acute findings.  She was placed on IV diltiazem and subsequently converted to sinus rhythm.  She was admitted for further eval and has maintained sinus rhythm since admission w/ rates in the 80's.  IV dilt has been transitioned to oral dilt and she has been maintained on home dose of carvedilol.  Eliquis 5mg  BID has been added.  Echo performed this AM - read pending.  Inpatient Medications    .  apixaban  5 mg Oral BID  . buPROPion  300 mg Oral q morning - 10a  . carvedilol  12.5 mg Oral BID WC  . Chlorhexidine Gluconate Cloth  6 each Topical Daily  . diltiazem  180 mg Oral Daily  . escitalopram  20 mg Oral q morning - 10a  . fenofibrate  160 mg Oral Daily  . linaclotide  72 mcg Oral QAC breakfast  . losartan  100 mg Oral q morning - 10a  . pantoprazole  40 mg Oral Daily  . triamterene-hydrochlorothiazide  0.5 tablet Oral q morning - 10a    Family History    Family History  Problem Relation Age of Onset  . Diabetes Mother   . Hypertension Mother   . Atrial fibrillation Mother   . Colon cancer Mother   . Diabetes Father        committed suicide  . Skin cancer Father   . Suicidality Father   . Hypertension  Father   . Diabetes Sister   . Diabetes Sister   . Breast cancer Other        materanl great aunt   She indicated that her mother is deceased. She indicated that her father is deceased. She indicated that both of her sisters are alive. She indicated that the status of her other is unknown.   Social History    Social History   Socioeconomic History  . Marital status: Married    Spouse name: [name] Ross  . Number of children: 2  . Years of education: Therapist, sports  . Highest education level: Not on file  Occupational History  . Occupation: Therapist, sports  Tobacco Use  . Smoking status: Never Smoker  . Smokeless tobacco: Never Used  Vaping Use  . Vaping Use: Never used  Substance and Sexual Activity  . Alcohol use: No    Alcohol/week: 0.0 standard drinks  . Drug use: No  . Sexual activity: Not on file  Other Topics Concern  . Not on file  Social History Narrative   Lives in [place].  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  Occasionally drinks coffee but not a heavy caffeine drinker.     Social Determinants of Health   Financial Resource Strain:   . Difficulty of Paying Living Expenses:   Food Insecurity:   . Worried About Charity fundraiser in the Last Year:   . Arboriculturist in the Last Year:   Transportation Needs:   . Film/video editor (Medical):   Marland Kitchen Lack of Transportation (Non-Medical):   Physical Activity:   . Days of Exercise per Week:   . Minutes of Exercise per Session:   Stress:   . Feeling of Stress :   Social Connections:   . Frequency of Communication with Friends and Family:   . Frequency of Social Gatherings with Friends and Family:   . Attends Religious Services:   . Active Member of Clubs or Organizations:   . Attends Archivist Meetings:   Marland Kitchen Marital Status:   Intimate Partner Violence:   . Fear of Current or Ex-Partner:   . Emotionally Abused:   Marland Kitchen Physically Abused:   . Sexually Abused:      Review of Systems    General:  No chills,  fever, night sweats or weight changes.  Cardiovascular:  No chest pain, +++ dyspnea on exertion at baseline x several years, no edema, orthopnea, +++ palpitations, no paroxysmal nocturnal dyspnea. Dermatological: No rash, lesions/masses  Respiratory: No cough, +++ dyspnea on exertion. Urologic: No hematuria, dysuria Abdominal:   No nausea, vomiting, diarrhea, bright red blood per rectum, melena, or hematemesis Neurologic:  No visual changes, wkns, changes in mental status. All other systems reviewed and are otherwise negative except as noted above.  Physical Exam    Blood pressure (!) 166/94, pulse 90, temperature 98.4 F (36.9 C), temperature source Oral, resp. rate 14, height 5\' 6"  (1.676 m), weight (!) 109.2 kg, SpO2 97 %.  General: Pleasant, NAD Psych: Normal affect. Neuro: Alert and oriented X 3. Moves all extremities spontaneously. HEENT: Normal  Neck: Supple, obese, difficult to gauge JVP.  No bruits. Lungs:  Resp regular and unlabored, CTA. Heart: RRR no s3, s4, or murmurs. Abdomen: Obese, soft, non-tender, non-distended, BS + x 4.  Extremities: No clubbing, cyanosis or edema. DP/PT/Radials 2+ and equal bilaterally.  Labs    Cardiac Enzymes Recent Labs  Lab 12/19/19 2254  TROPONINIHS 9      Lab Results  Component Value Date   WBC 7.5 12/20/2019   HGB 11.1 (L) 12/20/2019   HCT 31.5 (L) 12/20/2019   MCV 86.1 12/20/2019   PLT 398 12/20/2019    Recent Labs  Lab 12/20/19 0502  NA 136  K 3.9  CL 102  CO2 25  BUN 16  CREATININE 0.75  CALCIUM 9.2  GLUCOSE 116*   Lab Results  Component Value Date   CHOL 204 (H) 12/20/2019   HDL 39 (L) 12/20/2019   LDLCALC UNABLE TO CALCULATE IF TRIGLYCERIDE OVER 400 mg/dL 12/20/2019   TRIG 510 (H) 12/20/2019    Radiology Studies    DG Chest 2 View  Result Date: 12/19/2019 CLINICAL DATA:  Shortness of breath EXAM: CHEST - 2 VIEW COMPARISON:  01/23/2007 FINDINGS: The heart size and mediastinal contours are within normal  limits. Both lungs are clear. The visualized skeletal structures are unremarkable. Aortic atherosclerosis. IMPRESSION: No active cardiopulmonary disease. Electronically Signed   By: Donavan Foil M.D.   On: 12/19/2019 20:39    ECG & Cardiac Imaging    Afib RVR, 169, non-specific ST/T changes - personally reviewed.  Assessment & Plan    1.  PAF RVR:  Pt w/o prior cardiac hx, presented to the ED on 7/27 in the setting of sudden onset of tachypalpitations and was found to be in afib w/ RVR @ 169.  HsTrop nl @ 9 - no repeat obtained.  She converted to sinus rhythm on IV dilt and has since been switched to Dilt CD 180mg  daily.  She was prev on verapamil 240 mg daily @ home, along w/ carvedilol 6.25mg  BID.  HRs currently trending in the 80s and BPs remain elevated in the 150s to 160s.  I will increase carvedilol to 12.5mg  BID.  Cont eliquis (CHA2DS2VASc = 2).  Echo pending this AM.  Can likely be d/c'd later today pending echo.  Will arrange for outpt f/u at which point we can consider noninvasive ischemic testing given h/o DOE.  2.  Essential HTN:  Dx in her 70's.  Poorly controlled.  She says that diastolics are regularly in the 90's to 100's at home.  Titrating  blocker as above.  Cont Dilt, Triamterent-HCTZ, and ARB.  3.  HL/HTG:  TG 510 - on fibrate therapy as outpt - cont.  Needs nutritional counseling and wt loss.  4.  OSA:  She reports compliance w/ CPAP as outpt.  5.  Hypomagnesemia:  Supplemented 7/27.  F/u. K+ wnl.  6.  Normocytic anemia:  H/H down slightly following admission - was 13/36.3, currently 11.1/31.5.  Will need outpt f/u CBC given new eliquis start.  Signed, Murray Hodgkins, NP 12/20/2019, 11:27 AM  For questions or updates, please contact   Please consult [email or web address] for contact info under Cardiology/STEMI.

## 2019-12-20 NOTE — Progress Notes (Signed)
*  PRELIMINARY RESULTS* Echocardiogram 2D Echocardiogram has been performed.  [name] Ross 12/20/2019, 9:16 AM

## 2019-12-21 ENCOUNTER — Telehealth: Payer: Self-pay | Admitting: Cardiology

## 2019-12-21 NOTE — Telephone Encounter (Signed)
Patient calling  Patient started Eliquis in the hospital - was given the $10 copay card but since patient does not have insurance she cannot use it Patient would like to know if she can get the free trail card Please call to discuss

## 2019-12-21 NOTE — Telephone Encounter (Signed)
Called patient back and spoke with her about her Eliquis. Offered to put a patient assistance application with the 30 day free card we can provide, however, she has spoke with a rep from Eliquis and they informed her that she makes too much money to qualify. She will be in tomorrow to pick up the 30 day trial card that I have left at the front desk. She will discuss her options going forward with Laurann Montana during her follow up on 01/04/20.

## 2020-01-03 NOTE — Progress Notes (Signed)
Office Visit    Patient Name: [name] Ross Date of Encounter: 01/04/2020  Primary Care Provider:  Leanna Battles, MD Primary Cardiologist:  Kate Sable, MD Electrophysiologist:  None   Chief Complaint    [name] Ross is a 54 y.o. female with a hx of HTN, HLD, OSA, atrial fibrillation, depression, obesity, GERD presents today for follow up after hospitalization.   Past Medical History    Past Medical History:  Diagnosis Date  . Anal skin tag   . Depression   . Diverticulosis   . Diverticulosis, sigmoid   . Dyslipidemia   . GERD (gastroesophageal reflux disease)   . Hemorrhoids    internal and external  . History of diverticulitis    10/ 2016  . History of galactorrhea   . History of stress test    a. Pt reports h/o stress test and echo by cardiologist in [place] (no records in [place]).  Both reportedly nl.  . Hypertension   . IBS (irritable bowel syndrome)   . Morbid obesity ([place])   . PONV (postoperative nausea and vomiting)    severe  . Rash    elbows and behind knees  . Restless leg syndrome   . Sleep apnea    cpap sometimes  . Wears glasses    Past Surgical History:  Procedure Laterality Date  . BAND HEMORRHOIDECTOMY  2012   Dr Earlean Shawl  . CESAREAN SECTION  1994  . COLONOSCOPY  06/18/2011  . CYSTO/  BILATERAL PYELOGRAM RETROGRADES/  TRANSOBTURATOR SLING  03/08/2006  . EXCISION OF SKIN TAG N/A 02/13/2016   Procedure: ANAL SKIN TAG EXCISION;  Surgeon: [name], MD;  Location: [facility];  Service: General;  Laterality: N/A;  . LAPAROSCOPIC CHOLECYSTECTOMY  05/22/2001  . SHOULDER SURGERY      Allergies  No Known Allergies  History of Present Illness    [name] Ross is a 54 y.o. female with a hx of HTn, HLD, OSA, atrial fibrillation, depression, obesity, GERD last seen while hospitalized.  Admitted to [facility] 12/19/19 for palpitations with diagnosis of atrial fib with RVR. She had no known cardiac history. She was treated with IV  diltiazem and converted to NSR. She was started on Eliquis 5mg  BID, Diltiazem 180mg  daily and her Coreg was increased to 12.5mg  twice daily. She had an echo 12/20/19 with normal LVEF, RV normal size and function, mild MR.   Reports an episode of irregular heart beat the night before last that lasted 30-40 minutes. Associated with shortness of breath. Went away on its own. We discussed purchasing a Kardia device to assess for recurrent atrial fibrillation.   Her diastolic blood pressure has remained >100. Systolic blood pressure is routinely in the 140s. Heart rate at home 90s-110 bpm.   Endorses compliance with her CPAP. No formal exercise routine.  She tells me she went through the season of depression a few years ago due to losing her parents has not been very active since that time.  She reports some shortness of breath at rest which she attributes to feeling like her "nostrils are swollen".  Difficult to ascertain whether she is dyspneic on exertion as she does not have a very high level of activity.  Reports no chest pain, pressure, or tightness. No edema, orthopnea, PND.   EKGs/Labs/Other Studies Reviewed:   The following studies were reviewed today:  Echo 12/20/19  1. Left ventricular ejection fraction, by estimation, is 60 to 65%. The  left ventricle has  normal function. The left ventricle has no regional  wall motion abnormalities. Left ventricular diastolic parameters were  normal.   2. Right ventricular systolic function is normal. The right ventricular  size is normal.   3. The mitral valve is normal in structure. Trivial mitral valve  regurgitation.   4. The aortic valve is normal in structure. Aortic valve regurgitation is  not visualized.   EKG:  EKG is ordered today.  The ekg ordered today demonstrates NSR 93 bpm with no acute ST/T wave changes.   Recent Labs: 12/19/2019: Magnesium 1.5; TSH 3.607 12/20/2019: BUN 16; Creatinine, Ser 0.75; Hemoglobin 11.1; Platelets 398;  Potassium 3.9; Sodium 136  Recent Lipid Panel    Component Value Date/Time   CHOL 204 (H) 12/20/2019 0502   TRIG 510 (H) 12/20/2019 0502   HDL 39 (L) 12/20/2019 0502   CHOLHDL 5.2 12/20/2019 0502   VLDL UNABLE TO CALCULATE IF TRIGLYCERIDE OVER 400 mg/dL 12/20/2019 0502   LDLCALC UNABLE TO CALCULATE IF TRIGLYCERIDE OVER 400 mg/dL 12/20/2019 0502   LDLDIRECT 66.8 12/20/2019 0502    Home Medications   Current Meds  Medication Sig  . apixaban (ELIQUIS) 5 MG TABS tablet Take 1 tablet (5 mg total) by mouth 2 (two) times daily.  Marland Kitchen buPROPion (WELLBUTRIN XL) 300 MG 24 hr tablet Take 300 mg by mouth every morning.   . butalbital-acetaminophen-caffeine (ESGIC) 50-325-40 MG tablet Take by mouth 2 (two) times daily as needed for headache.  . carvedilol (COREG) 25 MG tablet Take 1 tablet (25 mg total) by mouth 2 (two) times daily.  Marland Kitchen dicyclomine (BENTYL) 10 MG capsule Take one tablet by mouth once to twice daily as needed  . diltiazem (CARDIZEM CD) 180 MG 24 hr capsule Take 1 capsule (180 mg total) by mouth daily.  Marland Kitchen escitalopram (LEXAPRO) 20 MG tablet Take 20 mg by mouth every morning.   Marland Kitchen estradiol (ESTRACE) 2 MG tablet Take 2 mg by mouth daily.  Marland Kitchen ESTRADIOL PO Take by mouth.  . fenofibrate micronized (LOFIBRA) 200 MG capsule Take 200 mg by mouth daily before breakfast.   . linaclotide (LINZESS) 72 MCG capsule Take 1 capsule (72 mcg total) by mouth daily before breakfast. (Patient taking differently: Take 72 mcg by mouth as needed. )  . losartan (COZAAR) 100 MG tablet Take 100 mg by mouth every morning.   . meclizine (ANTIVERT) 25 MG tablet Take 1 tablet (25 mg total) by mouth 3 (three) times daily as needed for dizziness.  . medroxyPROGESTERone (PROVERA) 2.5 MG tablet Take 2.5 mg by mouth daily.  Marland Kitchen MEDROXYPROGESTERONE ACETATE PO Take by mouth.  . Meloxicam (MOBIC PO) Take by mouth.  . meloxicam (MOBIC) 15 MG tablet Take 15 mg by mouth daily.  Marland Kitchen omeprazole (PRILOSEC) 20 MG capsule Take 20 mg by  mouth daily.  Marland Kitchen triamterene-hydrochlorothiazide (MAXZIDE-25) 37.5-25 MG per tablet Take 0.5 tablets by mouth every morning.   . valACYclovir (VALTREX) 1000 MG tablet Take 1,000 mg by mouth 2 (two) times daily as needed.   . zolpidem (AMBIEN) 10 MG tablet Take 10 mg by mouth at bedtime as needed for sleep.  . [DISCONTINUED] carvedilol (COREG) 12.5 MG tablet Take 12.5 mg by mouth 2 (two) times daily.    Review of Systems    Review of Systems  Constitutional: Negative for chills, fever and malaise/fatigue.  Cardiovascular: Positive for irregular heartbeat and palpitations. Negative for chest pain, dyspnea on exertion, leg swelling, near-syncope, orthopnea and syncope.  Respiratory: Positive for shortness of breath.  Negative for cough and wheezing.   Gastrointestinal: Negative for melena, nausea and vomiting.  Genitourinary: Negative for hematuria.  Neurological: Negative for dizziness, light-headedness and weakness.   All other systems reviewed and are otherwise negative except as noted above.  Physical Exam    VS:  BP (!) 148/100 (BP Location: Left Arm, Patient Position: Sitting, Cuff Size: Normal)   Pulse 93   Ht 5\' 6"  (1.676 m)   Wt 239 lb 3.2 oz (108.5 kg)   SpO2 98%   BMI 38.61 kg/m  , BMI Body mass index is 38.61 kg/m. GEN: Well nourished, overweight, well developed, in no acute distress. HEENT: normal. Neck: Supple, no JVD, carotid bruits, or masses. Cardiac: RRR, no murmurs, rubs, or gallops. No clubbing, cyanosis, edema.  Radials/DP/PT 2+ and equal bilaterally.  Respiratory:  Respirations regular and unlabored, clear to auscultation bilaterally. GI: Soft, nontender, nondistended. MS: No deformity or atrophy. Skin: Warm and dry, no rash. Neuro:  Strength and sensation are intact. Psych: Normal affect.  Assessment & Plan    1. PAF -EKG shows she is maintaining normal sinus rhythm with rate of 93 bpm.  Echocardiogram 12/20/2019 with normal LVEF and no significant valvular  abnormalities.  Does report that her mother had atrial fibrillation ultimately requiring ablation.  She reports episode of atrial fibrillation the night before last that lasted about 30 to 40 minutes associated with irregular heartbeat, shortness of breath.   Encouraged to purchase Kardia device.  If she continues to have recurrent episodes of palpitations will likely require ZIO monitoring.  Increase carvedilol to 25 mg twice daily for improved rate control and blood pressure control.  Provided Rx for Coreg 12.5 mg as needed for elevated heart rates that persist greater than 30 minutes.   2. Chronic anticoagulation - CHADS2VASc of 2 (HTN, gender).  Denies bleeding complications.  In the setting of normocytic anemia we will plan for repeat CBC today.  Of note, she does not have insurance and is does not meet income criteria for patient assistance.  She prefers to continue Eliquis 5 mg twice daily.  We reviewed possibility of transitioning to warfarin and she tells me at this time paying for Eliquis out-of-pocket is feasible.  She was provided with a 1 month sample in clinic today.  3. HTN -BP elevated.  Continue diltiazem 190 mg daily.  Increase carvedilol to 25 mg twice daily.  Continue losartan 100 mg daily.  4. Shortness of breath -likely multifactorial uncontrolled hypertension, deconditioning.  Unable to exclude element of angina.  Echo 12/12/2019 with no regional wall motion abnormalities.  Reassess shortness of breath after improved blood pressure and heart rate control, as above.  However, she has never had ischemic testing will need to discuss Lexiscan Myoview at follow-up.  5. HLD/hypertriglyceridemia -12/12/2019 lipid panel with total cholesterol 204, HDL 39, LDL 66.8, triglycerides 510.  Continue fenofibrate as prescribed by primary care provider.  We reviewed low triglyceride diet.  She has repeat labs with her PCP in October.  If triglycerides remain uncontrolled would likely benefit from  addition of Lovaza versus Vascepa.  6. OSA - Continued CPAP compliance encouraged.  Disposition: Follow up in 3 week(s) with Dr. Justice Deeds, NP 01/04/2020, 10:29 AM

## 2020-01-04 ENCOUNTER — Encounter: Payer: Self-pay | Admitting: Family

## 2020-01-04 ENCOUNTER — Other Ambulatory Visit: Payer: Self-pay

## 2020-01-04 ENCOUNTER — Ambulatory Visit (INDEPENDENT_AMBULATORY_CARE_PROVIDER_SITE_OTHER): Payer: Self-pay | Admitting: Family

## 2020-01-04 VITALS — BP 148/100 | HR 93 | Ht 66.0 in | Wt 239.2 lb

## 2020-01-04 DIAGNOSIS — G4733 Obstructive sleep apnea (adult) (pediatric): Secondary | ICD-10-CM

## 2020-01-04 DIAGNOSIS — I480 Paroxysmal atrial fibrillation: Secondary | ICD-10-CM | POA: Insufficient documentation

## 2020-01-04 DIAGNOSIS — Z7901 Long term (current) use of anticoagulants: Secondary | ICD-10-CM

## 2020-01-04 DIAGNOSIS — I10 Essential (primary) hypertension: Secondary | ICD-10-CM

## 2020-01-04 MED ORDER — CARVEDILOL 25 MG PO TABS: 25.0000 mg | ORAL_TABLET | Freq: Two times a day (BID) | ORAL | 1 refills | Status: DC

## 2020-01-04 MED ORDER — CARVEDILOL 12.5 MG PO TABS: 12.5000 mg | ORAL_TABLET | ORAL | 1 refills | Status: DC | PRN

## 2020-01-04 NOTE — Patient Instructions (Addendum)
Medication Instructions:  Your physician has recommended you make the following change in your medication:   CHANGE Carvedilol (Coreg) to 25mg  twice daily  Sent Rx for Coreg 12.5mg  PRN for atrial fibrillation You may take if you are having episodes of irregular heart beat lasting >30 minutes. Hopefully increasing your regular Coreg dose will help prevent episodes.   *If you need a refill on your cardiac medications before your next appointment, please call your pharmacy*  Lab Work: Your provider recommends lab work today: CBC  If you have labs (blood work) drawn today and your tests are completely normal, you will receive your results only by: Marland Kitchen MyChart Message (if you have MyChart) OR . A paper copy in the mail If you have any lab test that is abnormal or we need to change your treatment, we will call you to review the results.  Testing/Procedures: EKG today showed normal sinus rhythm.   Follow-Up: At [facility], you and your health needs are our priority.  As part of our continuing mission to provide you with exceptional heart care, we have created designated Provider Care Teams.  These Care Teams include your primary Cardiologist (physician) and Advanced Practice Providers (APPs -  Physician Assistants and Nurse Practitioners) who all work together to provide you with the care you need, when you need it.  We recommend signing up for the patient portal called "MyChart".  Sign up information is provided on this After Visit Summary.  MyChart is used to connect with patients for Virtual Visits (Telemedicine).  Patients are able to view lab/test results, encounter notes, upcoming appointments, etc.  Non-urgent messages can be sent to your provider as well.   To learn more about what you can do with MyChart, go to [email or web address].    Your next appointment:   3 week(s)  The format for your next appointment:   In Person  Provider:   You may see Kate Sable, MD or one of  the following Advanced Practice Providers on your designated Care Team:    Murray Hodgkins, NP  [name], PA-C  Marrianne Mood, PA-C  Laurann Montana, NP  Other Instructions  Jodelle Red for monitoring of EKGs at home: You can look into the [facility] device by [employer].This device is purchased by you and it connects to an application you download to your smart phone.  It can detect abnormal heart rhythms and alert you to contact your doctor for further evaluation. The device is approximately $90 and the phone application is free.  The web site is:  https://www.alivecor.com    High Triglycerides Eating Plan Your triglycerides during recent hospitalization were >500 and we would like them <150.  Triglycerides are a type of fat in the blood. High levels of triglycerides can increase your risk of heart disease and stroke. If your triglyceride levels are high, choosing the right foods can help lower your triglycerides and keep your heart healthy. Work with your health care provider or a diet and nutrition specialist (dietitian) to develop an eating plan that is right for you. What are tips for following this plan? General guidelines   Lose weight, if you are overweight. For most people, losing 5-10 lbs (2-5 kg) helps lower triglyceride levels. A weight-loss plan may include. ? 30 minutes of exercise at least 5 days a week. ? Reducing the amount of calories, sugar, and fat you eat.  Eat a wide variety of fresh fruits, vegetables, and whole grains. These foods are high in fiber.  Eat  foods that contain healthy fats, such as fatty fish, nuts, seeds, and olive oil.  Avoid foods that are high in added sugar, added salt (sodium), saturated fat, and trans fat.  Avoid low-fiber, refined carbohydrates such as white bread, crackers, noodles, and white rice.  Avoid foods with partially hydrogenated oils (trans fats), such as fried foods or stick margarine.  Limit alcohol intake to no more  than 1 drink a day for nonpregnant women and 2 drinks a day for men. One drink equals 12 oz of beer, 5 oz of wine, or 1 oz of hard liquor. Your health care provider may recommend that you drink less depending on your overall health. Reading food labels  Check food labels for the amount of saturated fat. Choose foods with no or very little saturated fat.  Check food labels for the amount of trans fat. Choose foods with no trans fat.  Check food labels for the amount of cholesterol. Choose foods low in cholesterol. Ask your dietitian how much cholesterol you should have each day.  Check food labels for the amount of sodium. Choose foods with less than 140 milligrams (mg) per serving. Shopping  Buy dairy products labeled as nonfat (skim) or low-fat (1%).  Avoid buying processed or prepackaged foods. These are often high in added sugar, sodium, and fat. Cooking  Choose healthy fats when cooking, such as olive oil or canola oil.  Cook foods using lower fat methods, such as baking, broiling, boiling, or grilling.  Make your own sauces, dressings, and marinades when possible, instead of buying them. Store-bought sauces, dressings, and marinades are often high in sodium and sugar. Meal planning  Eat more home-cooked food and less restaurant, buffet, and fast food.  Eat fatty fish at least 2 times each week. Examples of fatty fish include salmon, trout, mackerel, tuna, and herring.  If you eat whole eggs, do not eat more than 3 egg yolks per week. What foods are recommended? The items listed may not be a complete list. Talk with your dietitian about what dietary choices are best for you. Grains Whole wheat or whole grain breads, crackers, cereals, and pasta. Unsweetened oatmeal. Bulgur. Barley. Quinoa. Brown rice. Whole wheat flour tortillas. Vegetables Fresh or frozen vegetables. Low-sodium canned vegetables. Fruits All fresh, canned (in natural juice), or frozen fruits. Meats and other  protein foods Skinless chicken or [place]. Ground chicken or [place]. Lean cuts of pork, trimmed of fat. Fish and seafood, especially salmon, trout, and herring. Egg whites. Dried beans, peas, or lentils. Unsalted nuts or seeds. Unsalted canned beans. Natural peanut or almond butter. Dairy Low-fat dairy products. Skim or low-fat (1%) milk. Reduced fat (2%) and low-sodium cheese. Low-fat ricotta cheese. Low-fat cottage cheese. Plain, low-fat yogurt. Fats and oils Tub margarine without trans fats. Light or reduced-fat mayonnaise. Light or reduced-fat salad dressings. Avocado. Safflower, olive, sunflower, soybean, and canola oils. What foods are not recommended? The items listed may not be a complete list. Talk with your dietitian about what dietary choices are best for you. Grains White bread. White (regular) pasta. White rice. Cornbread. Bagels. Pastries. Crackers that contain trans fat. Vegetables Creamed or fried vegetables. Vegetables in a cheese sauce. Fruits Sweetened dried fruit. Canned fruit in syrup. Fruit juice. Meats and other protein foods Fatty cuts of meat. Ribs. Chicken wings. Berniece Salines. Sausage. Bologna. Salami. Chitterlings. Fatback. Hot dogs. Bratwurst. Packaged lunch meats. Dairy Whole or reduced-fat (2%) milk. Half-and-half. Cream cheese. Full-fat or sweetened yogurt. Full-fat cheese. Nondairy creamers. Whipped toppings. Processed  cheese or cheese spreads. Cheese curds. Beverages Alcohol. Sweetened drinks, such as soda, lemonade, fruit drinks, or punches. Fats and oils Butter. Stick margarine. Lard. Shortening. Ghee. Bacon fat. Tropical oils, such as coconut, palm kernel, or palm oils. Sweets and desserts Corn syrup. Sugars. Honey. Molasses. Candy. Jam and jelly. Syrup. Sweetened cereals. Cookies. Pies. Cakes. Donuts. Muffins. Ice cream. Condiments Store-bought sauces, dressings, and marinades that are high in sugar, such as ketchup and barbecue sauce. Summary  High levels of  triglycerides can increase the risk of heart disease and stroke. Choosing the right foods can help lower your triglycerides.  Eat plenty of fresh fruits, vegetables, and whole grains. Choose low-fat dairy and lean meats. Eat fatty fish at least twice a week.  Avoid processed and prepackaged foods with added sugar, sodium, saturated fat, and trans fat.  If you need suggestions or have questions about what types of food are good for you, talk with your health care provider or a dietitian. This information is not intended to replace advice given to you by your health care provider. Make sure you discuss any questions you have with your health care provider. Document Revised: 04/23/2017 Document Reviewed: 07/14/2016 Elsevier Patient Education  2020 [employer].

## 2020-01-05 ENCOUNTER — Telehealth: Payer: Self-pay | Admitting: *Deleted

## 2020-01-05 LAB — CBC
Hematocrit: 36.6 % (ref 34.0–46.6)
Hemoglobin: 12.1 g/dL (ref 11.1–15.9)
MCH: 29.2 pg (ref 26.6–33.0)
MCHC: 33.1 g/dL (ref 31.5–35.7)
MCV: 88 fL (ref 79–97)
Platelets: 473 10*3/uL — ABNORMAL HIGH (ref 150–450)
RBC: 4.15 x10E6/uL (ref 3.77–5.28)
RDW: 13.5 % (ref 11.7–15.4)
WBC: 7 10*3/uL (ref 3.4–10.8)

## 2020-01-05 NOTE — Telephone Encounter (Signed)
Left voicemail message to call back about lab results.

## 2020-01-05 NOTE — Telephone Encounter (Signed)
Patient has reviewed results in [place]. Closing encounter.

## 2020-01-05 NOTE — Telephone Encounter (Signed)
-----   Message from Loel Dubonnet, NP sent at 01/05/2020  8:18 AM EDT ----- Hemoglobin improved compared to previous.  Now normalized.  Platelets mildly elevated though overall stable compared to readings over the last 4 years.  Good result!  No changes at this time.

## 2020-01-16 ENCOUNTER — Other Ambulatory Visit: Payer: Self-pay | Admitting: Cardiology

## 2020-01-16 MED ORDER — DILTIAZEM HCL ER COATED BEADS 180 MG PO CP24: 180.0000 mg | ORAL_CAPSULE | Freq: Every day | ORAL | 0 refills | Status: DC

## 2020-01-16 MED ORDER — DILTIAZEM HCL ER COATED BEADS 180 MG PO CP24: 180.0000 mg | ORAL_CAPSULE | Freq: Every day | ORAL | 2 refills | Status: DC

## 2020-01-16 MED ORDER — APIXABAN 5 MG PO TABS: 5.0000 mg | ORAL_TABLET | Freq: Two times a day (BID) | ORAL | 1 refills | Status: DC

## 2020-01-16 NOTE — Telephone Encounter (Signed)
*  STAT* If patient is at the pharmacy, call can be transferred to refill team.   1. Which medications need to be refilled? (please list name of each medication and dose if known)  diltiazem 180 MG  Eliquis 5 MG   2. Which pharmacy/location (including street and city if local pharmacy) is medication to be sent to? [facility]   3. Do they need a 30 day or 90 day supply? 90 day

## 2020-01-16 NOTE — Addendum Note (Signed)
Addended by: Allean Found on: 01/16/2020 02:42 PM   Modules accepted: Orders

## 2020-01-16 NOTE — Telephone Encounter (Signed)
*  for eliquis refil... 4f, 108.5kg, scr 0.75 12/20/19, lovw/walker 01/04/20

## 2020-01-16 NOTE — Telephone Encounter (Signed)
Please review for refill of Eliquis. Thank you!   Requested Prescriptions   Signed Prescriptions Disp Refills   diltiazem (CARDIZEM CD) 180 MG 24 hr capsule 90 capsule 0    Sig: Take 1 capsule (180 mg total) by mouth daily.    Authorizing Provider: Kate Sable    Ordering User: Raelene Bott, Gavriella Hearst L

## 2020-01-24 NOTE — Progress Notes (Signed)
Office Visit    Patient Name: [name] Ross Date of Encounter: 01/25/2020  Primary Care Provider:  Leanna Battles, MD Primary Cardiologist:  Kate Sable, MD  Chief Complaint    Chief Complaint  Patient presents with  . Follow-up    3 Week follow up. Medications verbally reviewed with patient.    54 yo female with history of Afib with RVR, HTN, HLD, OSA, depression, obesity, and GERD, and who is being seen today for 1 month follow-up.    Past Medical History    Past Medical History:  Diagnosis Date  . Anal skin tag   . Depression   . Diverticulosis   . Diverticulosis, sigmoid   . Dyslipidemia   . GERD (gastroesophageal reflux disease)   . Hemorrhoids    internal and external  . History of diverticulitis    10/ 2016  . History of galactorrhea   . History of stress test    a. Pt reports h/o stress test and echo by cardiologist in [place] (no records in [place]).  Both reportedly nl.  . Hypertension   . IBS (irritable bowel syndrome)   . Morbid obesity ([place])   . PONV (postoperative nausea and vomiting)    severe  . Rash    elbows and behind knees  . Restless leg syndrome   . Sleep apnea    cpap sometimes  . Wears glasses    Past Surgical History:  Procedure Laterality Date  . BAND HEMORRHOIDECTOMY  2012   Dr Earlean Shawl  . CESAREAN SECTION  1994  . COLONOSCOPY  06/18/2011  . CYSTO/  BILATERAL PYELOGRAM RETROGRADES/  TRANSOBTURATOR SLING  03/08/2006  . EXCISION OF SKIN TAG N/A 02/13/2016   Procedure: ANAL SKIN TAG EXCISION;  Surgeon: [name], MD;  Location: [facility];  Service: General;  Laterality: N/A;  . LAPAROSCOPIC CHOLECYSTECTOMY  05/22/2001  . SHOULDER SURGERY      Allergies  No Known Allergies  History of Present Illness    [name] Ross is a 54 y.o. female with PMH as above. FHx includes a mother that also had Afib and ultimately required an ablation. She was reportedly at a cardiologist in [place] 5-10 years ago and  underwent stress testing and an echo, which she believes were normal. She resides in [facility] and owns several rest homes in [place]. Approximately 2008, and following the suicide of her father, she fell into a deep depression that was exacerbated by the death of her mother in 50. In that setting, she became relatively sedentary, and she had DOE while walking up inclines and stairs or over long distances.   ON 12/19/19, she was lying on her couch when she noticed tachypalpitations with SOB/DOE. After this persisted for a few minutes, she drove to [facility] ED, where she was found to be in Afib with RVR at 169bpm. Labs were unremarkable with the exception of hypomagnesemia at 1.5. She was placed on IV diltiazem and converted back to NSR. She was discharged on oral diltiazem 180mg  qd and Eliquis 5mg  BID. CHA2DS2VASc score of at least  2 (HTN, gender). PTA Coreg was increased to 12.5mg  BID. Echo 11/2019 showed normal LVEF and mild MR.   She was last seen at follow-up 01/04/2020, at which time she reported an episode of tachypalpitations that lasted 30-40 minutes and was again associated with SOB that then dissipated on its own.  Jodelle Red was discussed, as well as Zio monitoring. DBP was noted to be above 100  and SBP 140s with HR 90-110s. Coreg was increased to 25mg  BID with additional dose of Coreg 12.5 PRN for breakthrough sx.  She was continued on Losartan 100mg  daily. She reported CPAP compliance. No regular workout routine. It was noted that she did not have insurance and also did not meet the income requirement for assistance. She preferred to continue on Eliquis, though coumadin was discussed. It was noted that Lexiscan should be discussed at RTC given her SOB. Her triglycerides were noted to be high and if repeat labs continued to show high TG, it was discussed she may benefit from addition of Lovaza versus Vascepa. Most recent labs have continued to show high TG. Ongoing CPAP use was encouraged.   Today,  01/25/2020, she returns to clinic and denies any further episodes of racing heart rate, shortness of breath, or palpitations since her last clinic visit.  She attributes this to the increase in her Coreg to 25 mg twice daily.  She has not needed to use the additional dose of Coreg 12.5 as needed for breakthrough symptoms.  She continues to use her CPAP regularly.  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  She reports she is often tired when she comes home; however, she does realize the importance of activity and that her reduced activity is likely leading to her fatigue.  We discussed possible solutions to escalate activity slowly as outlined below.  Given her fatigue, Carlton Adam was discussed with patient preference to first work on lifestyle changes.  Labs/triglycerides reviewed with recommendations as below.  She does admit to eating a lot of preprocessed food and sugar with diet discussed and recommendations as below.  She notes that her constipation has improved.  She does express some concern about her BP (specifically diastolic BP), stating that her home BP runs as high as SBP 150s and DBP 100s.  She continues to pay out of pocket for her Eliquis; however, she would prefer to continue on Eliquis with samples and pay out of pocket costs over switching to warfarin at this time.  No signs or symptoms of bleeding.  She reports medication compliance.    Home Medications    Prior to Admission medications   Medication Sig Start Date End Date Taking? Authorizing Provider  apixaban (ELIQUIS) 5 MG TABS tablet Take 1 tablet (5 mg total) by mouth 2 (two) times daily. 01/16/20   Loel Dubonnet, NP  buPROPion (WELLBUTRIN XL) 300 MG 24 hr tablet Take 300 mg by mouth every morning.     [provider]  butalbital-acetaminophen-caffeine (ESGIC) 50-325-40 MG tablet Take by mouth 2 (two) times daily as needed for headache.     [provider]  carvedilol (COREG) 12.5 MG tablet Take 1 tablet (12.5 mg total) by mouth as needed (As needed for episodes of irregular heart beat). 01/04/20 04/03/20  Loel Dubonnet, NP  carvedilol (COREG) 25 MG tablet Take 1 tablet (25 mg total) by mouth 2 (two) times daily. 01/04/20   Loel Dubonnet, NP  dicyclomine (BENTYL) 10 MG capsule Take one tablet by mouth once to twice daily as needed 07/05/19   Armbruster, Carlota Raspberry, MD  diltiazem (CARDIZEM CD) 180 MG 24 hr capsule Take 1 capsule (180 mg total) by mouth daily. 01/16/20   Loel Dubonnet, NP  escitalopram (LEXAPRO) 20 MG tablet Take 20 mg by mouth every morning.     [provider]  estradiol (ESTRACE) 2 MG tablet Take 2 mg by mouth daily. 11/17/19   [provider]  ESTRADIOL PO Take by mouth.    [provider]  fenofibrate micronized (LOFIBRA) 200 MG capsule Take 200 mg by mouth daily before breakfast.  02/04/15   [provider]  linaclotide (LINZESS) 72 MCG capsule Take 1 capsule (72 mcg total) by mouth daily before breakfast. Patient taking differently: Take 72 mcg by mouth as needed.  07/17/19   Armbruster, Carlota Raspberry, MD  losartan (COZAAR) 100 MG tablet Take 100 mg by mouth every morning.  12/01/14   [provider]  meclizine (ANTIVERT) 25 MG tablet Take 1 tablet (25 mg total) by mouth 3 (three) times daily as needed for dizziness. 03/08/19   Lilia Pro., MD  medroxyPROGESTERone (PROVERA) 2.5 MG tablet Take 2.5 mg by mouth daily. 11/17/19   [provider]  MEDROXYPROGESTERONE ACETATE PO Take by mouth.    [provider]  Meloxicam (MOBIC PO) Take by mouth.    [provider]  meloxicam (MOBIC) 15 MG tablet Take 15 mg by mouth daily. 11/17/19   [provider]  omeprazole (PRILOSEC) 20 MG capsule Take 20 mg by mouth daily.    [provider]  triamterene-hydrochlorothiazide (MAXZIDE-25) 37.5-25 MG per tablet Take 0.5 tablets by  mouth every morning.     [provider]  valACYclovir (VALTREX) 1000 MG tablet Take 1,000 mg by mouth 2 (two) times daily as needed.     [provider]  zolpidem (AMBIEN) 10 MG tablet Take 10 mg by mouth at bedtime as needed for sleep.    [provider]    Review of Systems    She denies chest pain, palpitations, dyspnea, pnd, orthopnea, n, v, dizziness, syncope, edema, weight gain, or early satiety.  She reports improved constipation.  She reports fatigue, attributed to decreased activity and diet..   All other systems reviewed and are otherwise negative except as noted above.  Physical Exam    VS:  BP 128/86 (BP Location: Left Arm, Patient Position: Sitting, Cuff Size: Large)   Pulse 86   Ht 5\' 6"  (1.676 m)   Wt 239 lb 9.6 oz (108.7 kg)   SpO2 98%   BMI 38.67 kg/m  , BMI Body mass index is 38.67 kg/m. GEN: Well nourished, well developed, in no acute distress. HEENT: normal. Neck: Supple, no JVD, carotid bruits, or masses. Cardiac: RRR, no murmurs, rubs, or gallops. No clubbing, cyanosis, edema.  Radials/DP/PT 2+ and equal bilaterally.  Respiratory:  Respirations regular and unlabored, clear to auscultation bilaterally. GI: Soft, nontender, nondistended, BS + x 4. MS: no deformity or atrophy. Skin: warm and dry, no rash. Neuro:  Strength and sensation are intact. Psych: Normal affect.  Accessory Clinical Findings    ECG personally reviewed by me today -NSR, 86 bpm, QTc 457, poor R wave progression in leads III, aVF- no acute changes.  VITALS Reviewed today   Temp Readings from Last 3 Encounters:  12/20/19 98.4 F (36.9 C) (Oral)  07/05/19 98.4 F (36.9 C)  03/08/19 98.5 F (36.9 C) (Oral)   BP Readings from Last 3 Encounters:  01/25/20 128/86  01/04/20 (!) 148/100  12/20/19 (!) 158/98   Pulse Readings from Last 3 Encounters:  01/25/20 86  01/04/20 93  12/20/19 96    Wt Readings from Last 3 Encounters:  01/25/20 239 lb 9.6 oz (108.7  kg)  01/04/20 239 lb 3.2 oz (108.5 kg)  12/19/19 (!) 240 lb 12.8 oz (109.2 kg)     LABS  reviewed today   Lab Results  Component Value Date   WBC 7.0 01/04/2020   HGB 12.1 01/04/2020   HCT 36.6 01/04/2020   MCV 88 01/04/2020   PLT 473 (H) 01/04/2020   Lab Results  Component Value Date   CREATININE 0.75 12/20/2019   BUN 16 12/20/2019   NA 136 12/20/2019   K 3.9 12/20/2019   CL 102 12/20/2019   CO2 25 12/20/2019   Lab Results  Component Value Date   ALT 19 03/07/2015   AST 16 03/07/2015   ALKPHOS 35 (L) 03/07/2015   BILITOT 0.4 03/07/2015   Lab Results  Component Value Date   CHOL 204 (H) 12/20/2019   HDL 39 (L) 12/20/2019   LDLCALC UNABLE TO CALCULATE IF TRIGLYCERIDE OVER 400 mg/dL 12/20/2019   LDLDIRECT 66.8 12/20/2019   TRIG 510 (H) 12/20/2019   CHOLHDL 5.2 12/20/2019    No results found for: HGBA1C Lab Results  Component Value Date   TSH 3.607 12/19/2019    Magnesium 1.5  STUDIES/PROCEDURES reviewed today   12/20/19 Echo 1. Left ventricular ejection fraction, by estimation, is 60 to 65%. The  left ventricle has normal function. The left ventricle has no regional  wall motion abnormalities. Left ventricular diastolic parameters were  normal.  2. Right ventricular systolic function is normal. The right ventricular  size is normal.  3. The mitral valve is normal in structure. Trivial mitral valve  regurgitation.  4. The aortic valve is normal in structure. Aortic valve regurgitation is  not visualized.   Assessment & Plan    Essential hypertension --Reports concern regarding home BP with SBP as high as 150s and DBP as high as 100s.  BP today 128/86 and relatively well controlled; however, she expresses some concern over her home pressures and elevated diastolic pressure and would feel more comfortable with a medication change at this time, given she has noticed it creeping up with each office visit lately and would like to stay proactive with her  health.  She is concerned that she may not be able to make the lifestyle changes needed for BP control, given her hectic lifestyle.  Given her elevated diastolic pressures, this is a reasonable request as she continues to work on lifestyle changes. --She is currently on carvedilol 25 mg daily with as needed 12.5 mg for breakthrough symptoms, diltiazem 180 mg daily, losartan 100 mg daily, and 1/2 tablet triamterene-HCTZ 37.5-25 mg daily.  Given her recent struggles with constipation and electrolyte imbalances as below, I am hesitant to increase her triamterene-HCTZ, as this could exacerbate both these issues.  She is maxed out on her carvedilol dose.  Therefore, will increase diltiazem to 360 mg daily with 180 mg in the a.m. and 180 mg in the p.m.  She is agreeable to this change, as she had been intending to change her diltiazem to an evening dose to see how she tolerates this as well.  She will call the office if she does not tolerate this increase in her diltiazem.   --Lifestyle changes discussed as well, including diet (salt) and activity. She does report eating preprocessed foods, and we discussed that she may be getting more salt than she realizes.  Discussed that deconditioning could be playing a role with recommendation to increase activity as tolerated.  Discussed obtaining resistance bands from [place] to do short strengthening exercises when the motivation hits.  Paroxysmal atrial fibrillation --She  reports significant improvement in her tachypalpitations since increasing carvedilol to 25 mg daily with as needed 12.5 mg for breakthrough symptoms.  She denies needing her Coreg 12.5 mg for breakthrough symptoms since the increase to 25 mg daily.  Maintaining NSR today.  HR today 86 bpm; however, she reports her resting heart rate is still frequently up into the 90s and this is consistent with her rate during today's cardiac exam. Discussed deconditioning likely playing a role in her elevated rates and  the recommendation to increase activity as tolerated.  As detailed above, we will also increase diltiazem to 360 mg daily for additional HR and BP support as she works on these lifestyle changes. Continue Eliquis 5 mg twice daily for indefinite anticoagulation.   No s/sx of bleeding.  CBC stable from previous labs with patient request for further work-up and recommendations as below.  She reports compliance with anticoagulation.  Discussed that she pays out-of-pocket for Eliquis with patient preference to continue to pay out-of-pocket at this time over transition to warfarin.  We will continue to provide her with samples as available.  Anemia --No signs or symptoms of bleeding.  Most recent CBC with slightly elevated platelets but stable from previous labs.  She is slightly anemic with patient preference today to obtain CBC with differential for further work-up of her anemia.  Discussed that we can certainly obtain these labs and will forward to her PCP for any further work-up +/- anemia panel.    Shortness of breath, resolved --She denies further episodes since increase of carvedilol as above.  Per patient preference, continue to defer [facility] as she continues to work on lifestyle changes and BP/heart rate control.  Increase diltiazem to 360 mg daily and diet/activity changes as above.  Reassess symptoms at RTC.  If shortness of breath returns or any symptoms concerning for angina, reconsider Lexiscan Myoview at follow-up for further ischemic work-up and given she has never had ischemic testing in the past.  We will also obtain CBC with differential as above, given her elevated platelets.  HLD/hypertriglyceridemia --Discussed her most recent lipid panel.  11/2019 lipid panel with total cholesterol 204, LDL 66.8, triglycerides 510.  Discussed dietary changes, which she will continue to work on as above.  We looked up both Lovaza and Vascepa today on good Rx, and based on prices, she would like to  start Lovaza with prescription provided today.  We will reassess her triglycerides when her PCP rechecks her lipids in October.  Hypomagnesia  --Previous magnesium 1.5 as above.  She reports difficulties with constipation in the past, though this has improved as of the last few days.  Recheck magnesium today.  Prescription provided for magnesium oxide 500 mg daily.  If magnesium is still low on recheck, she will start this supplement.  If magnesium is at goal, she will not fill the prescription.  Discussed that magnesium may cause diarrhea with patient understanding.  OSA --Continue CPAP.  She reports compliance.  -------  Medication changes: Increase diltiazem to 360 mg daily (180 mg in a.m., 180 mg in p.m.), start Lovaza, magnesium oxide prescription provided today with patient understanding that she should only take this if labs continue to show low magnesium Labs ordered: CBC with differential per patient preference given her recent anemia and elevated platelets, magnesium lab. Studies / Imaging ordered: None. Future considerations: Lexiscan Myoview Disposition: RTC 6 months (given pays out-of-pocket) or sooner if indicated   Arvil Chaco, PA-C 01/25/2020

## 2020-01-25 ENCOUNTER — Encounter: Payer: Self-pay | Admitting: Physician Assistant

## 2020-01-25 ENCOUNTER — Ambulatory Visit (INDEPENDENT_AMBULATORY_CARE_PROVIDER_SITE_OTHER): Payer: Self-pay | Admitting: Physician Assistant

## 2020-01-25 ENCOUNTER — Other Ambulatory Visit: Payer: Self-pay

## 2020-01-25 VITALS — BP 128/86 | HR 86 | Ht 66.0 in | Wt 239.6 lb

## 2020-01-25 DIAGNOSIS — I480 Paroxysmal atrial fibrillation: Secondary | ICD-10-CM

## 2020-01-25 DIAGNOSIS — E781 Pure hyperglyceridemia: Secondary | ICD-10-CM

## 2020-01-25 DIAGNOSIS — E785 Hyperlipidemia, unspecified: Secondary | ICD-10-CM

## 2020-01-25 DIAGNOSIS — Z7901 Long term (current) use of anticoagulants: Secondary | ICD-10-CM

## 2020-01-25 DIAGNOSIS — I10 Essential (primary) hypertension: Secondary | ICD-10-CM

## 2020-01-25 DIAGNOSIS — R002 Palpitations: Secondary | ICD-10-CM

## 2020-01-25 DIAGNOSIS — G4733 Obstructive sleep apnea (adult) (pediatric): Secondary | ICD-10-CM

## 2020-01-25 MED ORDER — MAGNESIUM OXIDE -MG SUPPLEMENT 500 MG PO CAPS: 1.0000 | ORAL_CAPSULE | Freq: Every day | ORAL | 2 refills | Status: DC

## 2020-01-25 MED ORDER — OMEGA-3-ACID ETHYL ESTERS 1 G PO CAPS: 1.0000 g | ORAL_CAPSULE | Freq: Two times a day (BID) | ORAL | 5 refills | Status: AC

## 2020-01-25 MED ORDER — DILTIAZEM HCL ER COATED BEADS 180 MG PO CP24
180.0000 mg | ORAL_CAPSULE | Freq: Every day | ORAL | 2 refills | Status: DC
Start: 2020-01-25 — End: 2020-03-01

## 2020-01-25 NOTE — Patient Instructions (Signed)
Medication Instructions:  Your physician has recommended you make the following change in your medication:  1- INCREASE Diltiazem to 180 mg by mouth two times a day. 2- START Lovaza 1 gram by mouth two times a day. 3- Magnesium Oxide 500 mg by mouth once a day.  *If you need a refill on your cardiac medications before your next appointment, please call your pharmacy*   Lab Work: Your physician recommends that you return for lab work in: [place], [place].   If you have labs (blood work) drawn today and your tests are completely normal, you will receive your results only by: Marland Kitchen MyChart Message (if you have MyChart) OR . A paper copy in the mail If you have any lab test that is abnormal or we need to change your treatment, we will call you to review the results.   Testing/Procedures: none  Follow-Up: At [facility], you and your health needs are our priority.  As part of our continuing mission to provide you with exceptional heart care, we have created designated Provider Care Teams.  These Care Teams include your primary Cardiologist (physician) and Advanced Practice Providers (APPs -  Physician Assistants and Nurse Practitioners) who all work together to provide you with the care you need, when you need it.  We recommend signing up for the patient portal called "MyChart".  Sign up information is provided on this After Visit Summary.  MyChart is used to connect with patients for Virtual Visits (Telemedicine).  Patients are able to view lab/test results, encounter notes, upcoming appointments, etc.  Non-urgent messages can be sent to your provider as well.   To learn more about what you can do with MyChart, go to [email or web address].    Your next appointment:   6 month(s)  The format for your next appointment:   In Person  Provider:    You may see Kate Sable, MD or one of the following Advanced Practice Providers on your designated Care Team:    Murray Hodgkins,  NP  [name], PA-C  Marrianne Mood, PA-C

## 2020-01-26 ENCOUNTER — Telehealth: Payer: Self-pay

## 2020-01-26 LAB — CBC WITH DIFFERENTIAL/PLATELET
Basophils Absolute: 0 10*3/uL (ref 0.0–0.2)
Basos: 0 %
EOS (ABSOLUTE): 0.2 10*3/uL (ref 0.0–0.4)
Eos: 2 %
Hematocrit: 34.2 % (ref 34.0–46.6)
Hemoglobin: 11.5 g/dL (ref 11.1–15.9)
Immature Grans (Abs): 0.1 10*3/uL (ref 0.0–0.1)
Immature Granulocytes: 1 %
Lymphocytes Absolute: 2.1 10*3/uL (ref 0.7–3.1)
Lymphs: 32 %
MCH: 29.4 pg (ref 26.6–33.0)
MCHC: 33.6 g/dL (ref 31.5–35.7)
MCV: 88 fL (ref 79–97)
Monocytes Absolute: 0.4 10*3/uL (ref 0.1–0.9)
Monocytes: 6 %
Neutrophils Absolute: 4 10*3/uL (ref 1.4–7.0)
Neutrophils: 59 %
Platelets: 455 10*3/uL — ABNORMAL HIGH (ref 150–450)
RBC: 3.91 x10E6/uL (ref 3.77–5.28)
RDW: 13.2 % (ref 11.7–15.4)
WBC: 6.8 10*3/uL (ref 3.4–10.8)

## 2020-01-26 LAB — MAGNESIUM: Magnesium: 1.6 mg/dL (ref 1.6–2.3)

## 2020-01-26 NOTE — Telephone Encounter (Signed)
Called and left a detailed VM per DPR on file. Encouraged patient to call back with any questions or concerns.

## 2020-02-13 DIAGNOSIS — I10 Essential (primary) hypertension: Secondary | ICD-10-CM

## 2020-02-13 DIAGNOSIS — I480 Paroxysmal atrial fibrillation: Secondary | ICD-10-CM

## 2020-02-14 MED ORDER — CARVEDILOL 25 MG PO TABS: 37.5000 mg | ORAL_TABLET | Freq: Two times a day (BID) | ORAL | 3 refills | Status: DC

## 2020-02-29 ENCOUNTER — Other Ambulatory Visit: Payer: Self-pay | Admitting: Family

## 2020-02-29 DIAGNOSIS — I10 Essential (primary) hypertension: Secondary | ICD-10-CM

## 2020-02-29 DIAGNOSIS — I480 Paroxysmal atrial fibrillation: Secondary | ICD-10-CM

## 2020-03-01 ENCOUNTER — Telehealth: Payer: Self-pay | Admitting: Physician Assistant

## 2020-03-01 MED ORDER — DILTIAZEM HCL ER COATED BEADS 180 MG PO CP24: 180.0000 mg | ORAL_CAPSULE | Freq: Two times a day (BID) | ORAL | 2 refills | Status: DC

## 2020-03-01 NOTE — Telephone Encounter (Signed)
Spoke with [name] Ross in pharmacy and verified medications with her and she read the information back but also mentioned a as needed dose of 12.5 was also prescribed. Reviewed that dosage was not to be filled and to discontinue out of their system. She verbalized understanding with no further questions at this time.

## 2020-03-01 NOTE — Telephone Encounter (Signed)
Please review medication dosage and clarify

## 2020-03-01 NOTE — Telephone Encounter (Signed)
Patient medication doses still not correct.  Please call to correct this with patient .   Patient taking   37.5  Mg po BID Carvedilol and  Extra dose of 25 mg po PRN ( needs correcting )   Cardizem 180 mg po BID  ( correct )       Please call to discuss .  Visser ov and mychart responses do not match per patient .

## 2020-03-01 NOTE — Telephone Encounter (Signed)
  Marrianne Mood D, PA-C  You 26 minutes ago (5:03 PM)   She should be taking Cardizem 180 mg twice a day for total of Cardizem 360 mg daily.   In addition, carvedilol was increased to 37.5 mg twice daily.   She did have an as needed 12.5 mg x2/ day when taking carvedilol 25 mg twice daily. We increased to Coreg 37.5 milligrams twice daily (without the PRN dosing) after she had elevated BP via phone note 9/21 to see if that made a difference.   Message text      Spoke with patient and clarified instructions for her medication. Will also call pharmacy as well to clarify with them. Advised she should be taking Diltiazem 180 mg twice a day and then Diltiazem 37.5 mg twice a day and no as needed dosing. She did inquire if she should have arrhythmia what should she take. Reviewed that she should not take additional dose of 12.5 mg due to her being on maximum dosage already. Advised she should call the office and let us know so we can determine any additional measures should be done. She verbalized understanding of our conversation, agreement with plan, and had no further questions at this time.

## 2020-03-01 NOTE — Telephone Encounter (Signed)
Cardizem corrected and sent to pharmacy.

## 2020-03-01 NOTE — Telephone Encounter (Signed)
Patient states she needs to discuss the doseage for her Carvedilol, also discuss her Cardizem, how often she should take this.

## 2020-03-03 NOTE — Telephone Encounter (Signed)
MyChart message and phone call made to pt. Call made to pharmacy. Dosages corrected.

## 2020-06-25 DIAGNOSIS — R002 Palpitations: Secondary | ICD-10-CM

## 2020-06-25 DIAGNOSIS — I4891 Unspecified atrial fibrillation: Secondary | ICD-10-CM

## 2020-06-26 ENCOUNTER — Ambulatory Visit (INDEPENDENT_AMBULATORY_CARE_PROVIDER_SITE_OTHER): Payer: Self-pay

## 2020-06-26 DIAGNOSIS — R002 Palpitations: Secondary | ICD-10-CM

## 2020-06-26 DIAGNOSIS — I4891 Unspecified atrial fibrillation: Secondary | ICD-10-CM

## 2020-06-26 NOTE — Telephone Encounter (Signed)
[name] Ross, just letting you know that the pt did agree to both wearing a zio monitor and EP referral. I have placed referral and ordered monitor to be mailed to pt.

## 2020-07-01 NOTE — Telephone Encounter (Signed)
Please call pt to reschedule EP consult with Dr. Quentin Ore 07/03/20. Pt currently has Covid. Thank you.

## 2020-07-03 ENCOUNTER — Institutional Professional Consult (permissible substitution): Payer: Self-pay | Admitting: Cardiology

## 2020-07-08 NOTE — Telephone Encounter (Signed)
Attempted to schedule.  LMOV to call office.  ° °

## 2020-07-18 ENCOUNTER — Telehealth: Payer: Self-pay | Admitting: Cardiology

## 2020-07-18 ENCOUNTER — Other Ambulatory Visit: Payer: Self-pay | Admitting: Family

## 2020-07-18 NOTE — Telephone Encounter (Signed)
Called and left a VM for patient to call back. 

## 2020-07-18 NOTE — Telephone Encounter (Signed)
Patient c/o Palpitations:  High priority if patient c/o lightheadedness, shortness of breath, or chest pain  1) How long have you had palpitations/irregular HR/ Afib? Are you having the symptoms now? Few months  2) Are you currently experiencing lightheadedness, SOB or CP? SOB, lightheadedness   3) Do you have a history of afib (atrial fibrillation) or irregular heart rhythm? Yes   4) Have you checked your BP or HR? (document readings if available): 129/79 HR 112 147/94 HR 94  168/105 HR 87  5) Are you experiencing any other symptoms? SOB, lightheadedness and weakness  Would like to know if she can come in and see Dr Quentin Ore sooner

## 2020-07-18 NOTE — Telephone Encounter (Addendum)
Patient stated that she feels like she is having several more episodes and is feeling SOB, and lightheadedness. I informed her that Dr. Quentin Ore does not have anything available here sooner than 08/14/20.   I informed her that I would reach out to our A fib clinic in [place], which she stated that she would be very interested in going to. I called and left a message with the clinic and will route this to nurse Rushie Goltz to help further facilitate this.

## 2020-07-18 NOTE — Telephone Encounter (Signed)
68f, 108.7kg, scr 0.75 12/20/19, lovw/visser 01/25/20 refill for eliquis 5mg  granted

## 2020-07-18 NOTE — Telephone Encounter (Signed)
Please review for refill. Thanks!  

## 2020-07-19 ENCOUNTER — Encounter (HOSPITAL_COMMUNITY): Payer: Self-pay | Admitting: Physician Assistant

## 2020-07-19 ENCOUNTER — Ambulatory Visit (HOSPITAL_COMMUNITY)
Admission: RE | Admit: 2020-07-19 | Discharge: 2020-07-19 | Disposition: A | Discharge status: Home / Self Care | Payer: Self-pay | Source: Ambulatory Visit | Attending: Physician Assistant | Admitting: Physician Assistant

## 2020-07-19 ENCOUNTER — Other Ambulatory Visit: Payer: Self-pay

## 2020-07-19 VITALS — BP 116/74 | HR 85 | Ht 66.0 in | Wt 246.2 lb

## 2020-07-19 DIAGNOSIS — G4733 Obstructive sleep apnea (adult) (pediatric): Secondary | ICD-10-CM | POA: Insufficient documentation

## 2020-07-19 DIAGNOSIS — E669 Obesity, unspecified: Secondary | ICD-10-CM | POA: Insufficient documentation

## 2020-07-19 DIAGNOSIS — I480 Paroxysmal atrial fibrillation: Secondary | ICD-10-CM | POA: Insufficient documentation

## 2020-07-19 DIAGNOSIS — E785 Hyperlipidemia, unspecified: Secondary | ICD-10-CM | POA: Insufficient documentation

## 2020-07-19 DIAGNOSIS — Z6839 Body mass index (BMI) 39.0-39.9, adult: Secondary | ICD-10-CM | POA: Insufficient documentation

## 2020-07-19 DIAGNOSIS — Z7901 Long term (current) use of anticoagulants: Secondary | ICD-10-CM | POA: Insufficient documentation

## 2020-07-19 DIAGNOSIS — I10 Essential (primary) hypertension: Secondary | ICD-10-CM | POA: Insufficient documentation

## 2020-07-19 MED ORDER — FLECAINIDE ACETATE 50 MG PO TABS
50.0000 mg | ORAL_TABLET | Freq: Two times a day (BID) | ORAL | 3 refills | Status: DC
Start: 2020-07-19 — End: 2020-10-28

## 2020-07-19 NOTE — Patient Instructions (Signed)
STOP verapamil  Start Flecainide 50mg  twice a day

## 2020-07-19 NOTE — Telephone Encounter (Signed)
Patient seen in the [facility] today. Closing encounter.

## 2020-07-19 NOTE — Progress Notes (Signed)
Primary Care Physician: Leanna Battles, MD Primary Cardiologist: Dr [name] Primary Electrophysiologist: Dr Quentin Ore (new) Referring Physician: [facility] T[facility]    [name] Ross is a 55 y.o. female with a history of atrial fibrillation, HTN, HLD, OSA, depression, GERD who presents for consultation in the [facility]. The patient was initially diagnosed with atrial fibrillation 11/2019 after presenting to the ED with symptoms of tachypalpitations. Patient is on Eliquis for a CHADS2VASC score of 2. She has been on Coreg and diltiazem which had controlled her palpitations but recently she has had more breakthrough symptoms. There are no specific triggers that she could identify. She denies any bleeding issues on anticoagulation.   Today, she denies symptoms of chest pain, shortness of breath, orthopnea, PND, lower extremity edema, dizziness, presyncope, syncope, bleeding, or neurologic sequela. The patient is tolerating medications without difficulties and is otherwise without complaint today.    Atrial Fibrillation Risk Factors:  she does have symptoms or diagnosis of sleep apnea. she is compliant with CPAP therapy. she does not have a history of rheumatic fever. she does not have a history of alcohol use. The patient does have a history of early familial atrial fibrillation or other arrhythmias. Mother has afib.  she has a BMI of Body mass index is 39.74 kg/m.Marland Kitchen Filed Weights   07/19/20 0859  Weight: 111.7 kg    Family History  Problem Relation Age of Onset  . Diabetes Mother   . Hypertension Mother   . Atrial fibrillation Mother   . Colon cancer Mother   . Diabetes Father        committed suicide  . Skin cancer Father   . Suicidality Father   . Hypertension Father   . Diabetes Sister   . Diabetes Sister   . Breast cancer Other        materanl great aunt     Atrial Fibrillation Management history:  Previous antiarrhythmic drugs:  none Previous cardioversions: none Previous ablations: none CHADS2VASC score: 2 Anticoagulation history: Eliquis   Past Medical History:  Diagnosis Date  . Anal skin tag   . Depression   . Diverticulosis   . Diverticulosis, sigmoid   . Dyslipidemia   . GERD (gastroesophageal reflux disease)   . Hemorrhoids    internal and external  . History of diverticulitis    10/ 2016  . History of galactorrhea   . History of stress test    a. Pt reports h/o stress test and echo by cardiologist in [place] (no records in [place]).  Both reportedly nl.  . Hypertension   . IBS (irritable bowel syndrome)   . Morbid obesity ([place])   . PONV (postoperative nausea and vomiting)    severe  . Rash    elbows and behind knees  . Restless leg syndrome   . Sleep apnea    cpap sometimes  . Wears glasses    Past Surgical History:  Procedure Laterality Date  . BAND HEMORRHOIDECTOMY  2012   Dr Earlean Shawl  . CESAREAN SECTION  1994  . COLONOSCOPY  06/18/2011  . CYSTO/  BILATERAL PYELOGRAM RETROGRADES/  TRANSOBTURATOR SLING  03/08/2006  . EXCISION OF SKIN TAG N/A 02/13/2016   Procedure: ANAL SKIN TAG EXCISION;  Surgeon: [name], MD;  Location: [facility];  Service: General;  Laterality: N/A;  . LAPAROSCOPIC CHOLECYSTECTOMY  05/22/2001  . SHOULDER SURGERY      Current Outpatient Medications  Medication Sig Dispense Refill  . Ascorbic Acid (VITAMIN  C) 1000 MG tablet Take 1,000 mg by mouth daily.    Marland Kitchen buPROPion (WELLBUTRIN XL) 300 MG 24 hr tablet Take 300 mg by mouth every morning.     . butalbital-acetaminophen-caffeine (FIORICET) 50-325-40 MG tablet Take by mouth 2 (two) times daily as needed for headache.    . carvedilol (COREG) 25 MG tablet Take 1.5 tablets (37.5 mg total) by mouth 2 (two) times daily. 90 tablet 3  . cholecalciferol (VITAMIN D3) 25 MCG (1000 UNIT) tablet Take 1,000 Units by mouth daily.    Marland Kitchen dicyclomine (BENTYL) 10 MG capsule Take one tablet by mouth once to twice daily  as needed 90 capsule 3  . diltiazem (CARDIZEM CD) 180 MG 24 hr capsule Take 1 capsule (180 mg total) by mouth in the morning and at bedtime. 180 capsule 2  . ELIQUIS 5 MG TABS tablet TAKE 1 TABLET BY MOUTH TWICE DAILY 180 tablet 1  . escitalopram (LEXAPRO) 20 MG tablet Take 20 mg by mouth every morning.     Marland Kitchen estradiol (ESTRACE) 2 MG tablet Take 2 mg by mouth daily.    . fenofibrate micronized (LOFIBRA) 200 MG capsule Take 200 mg by mouth daily before breakfast.   4  . loratadine (ALAVERT) 10 MG dissolvable tablet Take 1 tablet by mouth daily.    Marland Kitchen losartan (COZAAR) 100 MG tablet Take 100 mg by mouth every morning.   3  . meclizine (ANTIVERT) 25 MG tablet Take 1 tablet (25 mg total) by mouth 3 (three) times daily as needed for dizziness. 30 tablet 0  . medroxyPROGESTERone (PROVERA) 2.5 MG tablet Take 2.5 mg by mouth daily.    . meloxicam (MOBIC) 15 MG tablet Take 15 mg by mouth daily.    . mometasone (ELOCON) 0.1 % ointment Apply topically 2 (two) times daily.    Marland Kitchen omega-3 acid ethyl esters (LOVAZA) 1 g capsule Take 1 capsule (1 g total) by mouth 2 (two) times daily. 60 capsule 5  . omeprazole (PRILOSEC) 20 MG capsule Take 20 mg by mouth daily.    . Prenatal 27-1 MG TABS Take 1 tablet by mouth daily.    Marland Kitchen triamcinolone (KENALOG) 0.1 % Apply topically 2 (two) times daily.    Marland Kitchen triamterene-hydrochlorothiazide (MAXZIDE-25) 37.5-25 MG per tablet Take 0.5 tablets by mouth every morning.     . valACYclovir (VALTREX) 1000 MG tablet Take 1,000 mg by mouth 2 (two) times daily as needed.     . verapamil (CALAN-SR) 240 MG CR tablet Take 1.5 tablets by mouth daily.    . vitamin B-12 (CYANOCOBALAMIN) 1000 MCG tablet Take 1,000 mcg by mouth daily.    Marland Kitchen zolpidem (AMBIEN) 10 MG tablet Take 10 mg by mouth at bedtime as needed for sleep.     No current facility-administered medications for this encounter.    Allergies  Allergen Reactions  . Venlafaxine     Other reaction(s): sleepy    Social History    Socioeconomic History  . Marital status: Married    Spouse name: [name]  . Number of children: 2  . Years of education: Therapist, sports  . Highest education level: Not on file  Occupational History  . Occupation: Therapist, sports  Tobacco Use  . Smoking status: Never Smoker  . Smokeless tobacco: Never Used  Vaping Use  . Vaping Use: Never used  Substance and Sexual Activity  . Alcohol use: No    Alcohol/week: 0.0 standard drinks  . Drug use: No  . Sexual activity: Not on file  Other Topics Concern  . Not on file  Social History Narrative   Lives in [place].  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  Occasionally drinks coffee but not a heavy caffeine drinker.     Social Determinants of Health   Financial Resource Strain: Not on file  Food Insecurity: Not on file  Transportation Needs: Not on file  Physical Activity: Not on file  Stress: Not on file  Social Connections: Not on file  Intimate Partner Violence: Not on file     ROS- All systems are reviewed and negative except as per the HPI above.  Physical Exam: Vitals:   07/19/20 0859  BP: 116/74  Pulse: 85  Weight: 111.7 kg  Height: 5\' 6"  (1.676 m)    GEN- The patient is well appearing obese female, alert and oriented x 3 today.   Head- normocephalic, atraumatic Eyes-  Sclera clear, conjunctiva pink Ears- hearing intact Oropharynx- clear Neck- supple  Lungs- Clear to ausculation bilaterally, normal work of breathing Heart- Regular rate and rhythm, no murmurs, rubs or gallops  GI- soft, NT, ND, + BS Extremities- no clubbing, cyanosis, or edema MS- no significant deformity or atrophy Skin- no rash or lesion Psych- euthymic mood, full affect Neuro- strength and sensation are intact  Wt Readings from Last 3 Encounters:  07/19/20 111.7 kg  01/25/20 108.7 kg  01/04/20 108.5 kg    EKG today demonstrates  SR, prolonged QT Vent. rate 85 BPM PR interval 150 ms QRS duration 76 ms QT/QTc 410/487 ms  Echo 12/20/19  demonstrated  1. Left ventricular ejection fraction, by estimation, is 60 to 65%. The  left ventricle has normal function. The left ventricle has no regional  wall motion abnormalities. Left ventricular diastolic parameters were  normal.  2. Right ventricular systolic function is normal. The right ventricular  size is normal.  3. The mitral valve is normal in structure. Trivial mitral valve  regurgitation.  4. The aortic valve is normal in structure. Aortic valve regurgitation is  not visualized.   Epic records are reviewed at length today  CHA2DS2-VASc Score = 2  The patient's score is based upon: CHF History: No HTN History: Yes Diabetes History: No Stroke History: No Vascular Disease History: No Age Score: 0 Gender Score: 1      ASSESSMENT AND PLAN: 1. Paroxysmal Atrial Fibrillation (ICD10:  I48.0) The patient's CHA2DS2-VASc score is 2, indicating a 2.2% annual risk of stroke.   We discussed therapeutic options today including AAD and ablation. Long-term, she is interested in ablation and is working on [employer] fast streamer. For now, will start flecainide 50 mg BID. Will need to watch QT closely on her antidepressants. QT 470-480 ms too long for dofetilide and sotalol.  Continue Coreg 37.5 mg BID Continue Eliquis 5 mg BID Continue diltiazem 180 mg BID  2. Obesity Body mass index is 39.74 kg/m. Lifestyle modification was discussed at length including regular exercise and weight reduction.  3. Obstructive sleep apnea The importance of adequate treatment of sleep apnea was discussed today in order to improve our ability to maintain sinus rhythm long term. Patient reports compliance with CPAP therapy.   4. HTN Stable, no changes today.   Follow up in the AF clinic for ECG next week.    [facility] [address] [place], [place] [address] [phone] 07/19/2020 9:06 AM

## 2020-07-23 ENCOUNTER — Ambulatory Visit (HOSPITAL_COMMUNITY)
Admission: RE | Admit: 2020-07-23 | Discharge: 2020-07-23 | Disposition: A | Discharge status: Home / Self Care | Payer: 59 | Source: Ambulatory Visit | Attending: Physician Assistant | Admitting: Physician Assistant

## 2020-07-23 ENCOUNTER — Other Ambulatory Visit: Payer: Self-pay

## 2020-07-23 VITALS — BP 124/78 | HR 81

## 2020-07-23 DIAGNOSIS — I4891 Unspecified atrial fibrillation: Secondary | ICD-10-CM | POA: Diagnosis present

## 2020-07-23 DIAGNOSIS — I480 Paroxysmal atrial fibrillation: Secondary | ICD-10-CM

## 2020-07-23 NOTE — Progress Notes (Signed)
Patient returns for ECG after starting flecainide. ECG shows SR, prolonged QT, PR 158, QRS 78, QTc 487. Patient reports the flecainide has improved her symptoms of heart racing. Will continue on present dose with follow ups with Dr [name] and Dr Quentin Ore. She does report she now has health insurance.

## 2020-07-24 ENCOUNTER — Institutional Professional Consult (permissible substitution): Payer: Self-pay | Admitting: Cardiology

## 2020-07-29 ENCOUNTER — Other Ambulatory Visit: Payer: Self-pay

## 2020-07-29 ENCOUNTER — Encounter: Payer: Self-pay | Admitting: Cardiology

## 2020-07-29 ENCOUNTER — Ambulatory Visit (INDEPENDENT_AMBULATORY_CARE_PROVIDER_SITE_OTHER): Payer: 59 | Admitting: Cardiology

## 2020-07-29 VITALS — BP 104/80 | HR 81 | Ht 66.0 in | Wt 248.0 lb

## 2020-07-29 DIAGNOSIS — I480 Paroxysmal atrial fibrillation: Secondary | ICD-10-CM | POA: Diagnosis not present

## 2020-07-29 DIAGNOSIS — I10 Essential (primary) hypertension: Secondary | ICD-10-CM

## 2020-07-29 NOTE — Progress Notes (Signed)
Cardiology Office Note:    Date:  07/29/2020   ID:  [name] Ross, DOB 01-12-1966, MRN [id number]  PCP:  Leanna Battles, [place]  Cardiologist:  Kate Sable, MD  Advanced Practice Provider:  No care team member to display Electrophysiologist:  None       Referring MD: Leanna Battles, MD   Chief Complaint  Patient presents with  . 6 month follow up     "doing well." Medications reviewed by the patient verbally.     History of Present Illness:    [name] Ross is a 55 y.o. female with a hx of paroxysmal atrial fibrillation, hypertension, GERD who presents for follow-up.  Patient initially seen by myself about a year ago due to palpitations.  EKG in the ED showed A. fib with RVR.  Started on Coreg, diltiazem.  Eliquis 5 mg twice daily was also started.  She had symptoms of palpitations, shortness of breath or lightheadedness.  Started on flecainide with improvement in symptoms. Tolerating Eliquis without any adverse effects, mentioned having spotting recently. Plans to follow-up with GYN as her are annual exam is due  Prior notes Echocardiogram 09/3612 normal systolic and diastolic function, EF 60 to 65%  Past Medical History:  Diagnosis Date  . Anal skin tag   . Depression   . Diverticulosis   . Diverticulosis, sigmoid   . Dyslipidemia   . GERD (gastroesophageal reflux disease)   . Hemorrhoids    internal and external  . History of diverticulitis    10/ 2016  . History of galactorrhea   . History of stress test    a. Pt reports h/o stress test and echo by cardiologist in [place] (no records in [place]).  Both reportedly nl.  . Hypertension   . IBS (irritable bowel syndrome)   . Morbid obesity ([place])   . PONV (postoperative nausea and vomiting)    severe  . Rash    elbows and behind knees  . Restless leg syndrome   . Sleep apnea    cpap sometimes  . Wears glasses     Past Surgical History:  Procedure Laterality Date  . BAND  HEMORRHOIDECTOMY  2012   Dr Earlean Shawl  . CESAREAN SECTION  1994  . COLONOSCOPY  06/18/2011  . CYSTO/  BILATERAL PYELOGRAM RETROGRADES/  TRANSOBTURATOR SLING  03/08/2006  . EXCISION OF SKIN TAG N/A 02/13/2016   Procedure: ANAL SKIN TAG EXCISION;  Surgeon: [name], MD;  Location: [facility];  Service: General;  Laterality: N/A;  . LAPAROSCOPIC CHOLECYSTECTOMY  05/22/2001  . SHOULDER SURGERY      Current Medications: Current Meds  Medication Sig  . buPROPion (WELLBUTRIN XL) 300 MG 24 hr tablet Take 300 mg by mouth every morning.   . butalbital-acetaminophen-caffeine (FIORICET) 50-325-40 MG tablet Take by mouth 2 (two) times daily as needed for headache.  . Calcium Carbonate-Vit D-Min (CALCIUM 1200 PO) Take by mouth.  . carvedilol (COREG) 25 MG tablet Take 1.5 tablets (37.5 mg total) by mouth 2 (two) times daily.  . cholecalciferol (VITAMIN D3) 25 MCG (1000 UNIT) tablet Take 1,000 Units by mouth daily.  Marland Kitchen dicyclomine (BENTYL) 10 MG capsule Take one tablet by mouth once to twice daily as needed  . diltiazem (CARDIZEM CD) 180 MG 24 hr capsule Take 1 capsule (180 mg total) by mouth in the morning and at bedtime.  Marland Kitchen ELIQUIS 5 MG TABS tablet TAKE 1 TABLET BY MOUTH TWICE DAILY  .  escitalopram (LEXAPRO) 20 MG tablet Take 20 mg by mouth every morning.   Marland Kitchen estradiol (ESTRACE) 2 MG tablet Take 2 mg by mouth daily.  . fenofibrate micronized (LOFIBRA) 200 MG capsule Take 200 mg by mouth daily before breakfast.   . flecainide (TAMBOCOR) 50 MG tablet Take 1 tablet (50 mg total) by mouth 2 (two) times daily.  Marland Kitchen loratadine (ALAVERT) 10 MG dissolvable tablet Take 1 tablet by mouth daily.  Marland Kitchen losartan (COZAAR) 100 MG tablet Take 100 mg by mouth every morning.   . meclizine (ANTIVERT) 25 MG tablet Take 1 tablet (25 mg total) by mouth 3 (three) times daily as needed for dizziness.  . medroxyPROGESTERone (PROVERA) 2.5 MG tablet Take 2.5 mg by mouth daily.  . meloxicam (MOBIC) 15 MG tablet Take  15 mg by mouth daily.  . mometasone (ELOCON) 0.1 % ointment Apply topically 2 (two) times daily.  Marland Kitchen omega-3 acid ethyl esters (LOVAZA) 1 g capsule Take 1 capsule (1 g total) by mouth 2 (two) times daily.  Marland Kitchen omeprazole (PRILOSEC) 20 MG capsule Take 20 mg by mouth daily.  . Prenatal 27-1 MG TABS Take 1 tablet by mouth daily.  Marland Kitchen triamcinolone (KENALOG) 0.1 % Apply topically 2 (two) times daily.  Marland Kitchen triamterene-hydrochlorothiazide (MAXZIDE-25) 37.5-25 MG per tablet Take 0.5 tablets by mouth every morning.   . valACYclovir (VALTREX) 1000 MG tablet Take 1,000 mg by mouth 2 (two) times daily as needed.   . vitamin B-12 (CYANOCOBALAMIN) 1000 MCG tablet Take 1,000 mcg by mouth daily.  Marland Kitchen zolpidem (AMBIEN) 10 MG tablet Take 10 mg by mouth at bedtime as needed for sleep.     Allergies:   Venlafaxine   Social History   Socioeconomic History  . Marital status: Married    Spouse name: [name]  . Number of children: 2  . Years of education: Therapist, sports  . Highest education level: Not on file  Occupational History  . Occupation: Therapist, sports  Tobacco Use  . Smoking status: Never Smoker  . Smokeless tobacco: Never Used  Vaping Use  . Vaping Use: Never used  Substance and Sexual Activity  . Alcohol use: No    Alcohol/week: 0.0 standard drinks  . Drug use: No  . Sexual activity: Not on file  Other Topics Concern  . Not on file  Social History Narrative   Lives in [place].  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  Occasionally drinks coffee but not a heavy caffeine drinker.     Social Determinants of Health   Financial Resource Strain: Not on file  Food Insecurity: Not on file  Transportation Needs: Not on file  Physical Activity: Not on file  Stress: Not on file  Social Connections: Not on file     Family History: The patient's family history includes Atrial fibrillation in her mother; Breast cancer in an other family member; Colon cancer in her mother; Diabetes in her father, mother, sister,  and sister; Hypertension in her father and mother; Skin cancer in her father; Suicidality in her father.  ROS:   Please see the history of present illness.     All other systems reviewed and are negative.  EKGs/Labs/Other Studies Reviewed:    The following studies were reviewed today:   EKG:  EKG is  ordered today.  The ekg ordered today demonstrates normal sinus rhythm, QTC prolonged 487  Recent Labs: 12/19/2019: TSH 3.607 12/20/2019: BUN 16; Creatinine, Ser 0.75; Potassium 3.9; Sodium 136 01/25/2020: Hemoglobin 11.5; Magnesium 1.6; Platelets  455  Recent Lipid Panel    Component Value Date/Time   CHOL 204 (H) 12/20/2019 0502   TRIG 510 (H) 12/20/2019 0502   HDL 39 (L) 12/20/2019 0502   CHOLHDL 5.2 12/20/2019 0502   VLDL UNABLE TO CALCULATE IF TRIGLYCERIDE OVER 400 mg/dL 12/20/2019 0502   LDLCALC UNABLE TO CALCULATE IF TRIGLYCERIDE OVER 400 mg/dL 12/20/2019 0502   LDLDIRECT 66.8 12/20/2019 0502     Risk Assessment/Calculations:      Physical Exam:    VS:  BP 104/80 (BP Location: Left Arm, Patient Position: Sitting, Cuff Size: Large)   Pulse 81   Ht 5\' 6"  (1.676 m)   Wt 248 lb (112.5 kg)   SpO2 98%   BMI 40.03 kg/m     Wt Readings from Last 3 Encounters:  07/29/20 248 lb (112.5 kg)  07/19/20 246 lb 3.2 oz (111.7 kg)  01/25/20 239 lb 9.6 oz (108.7 kg)     GEN:  Well nourished, well developed in no acute distress HEENT: Normal NECK: No JVD; No carotid bruits LYMPHATICS: No lymphadenopathy CARDIAC: RRR, no murmurs, rubs, gallops RESPIRATORY:  Clear to auscultation without rales, wheezing or rhonchi  ABDOMEN: Soft, non-tender, non-distended MUSCULOSKELETAL:  No edema; No deformity  SKIN: Warm and dry NEUROLOGIC:  Alert and oriented x 3 PSYCHIATRIC:  Normal affect   ASSESSMENT:    1. Paroxysmal atrial fibrillation (HCC)   2. Primary hypertension    PLAN:    In order of problems listed above:  1. Paroxysmal atrial fibrillation, CHA2DS2-VASc score 2.  Symptoms of palpitations, improved since starting flecainide., last EF 60%.  Continue Coreg, diltiazem, flecainide, Eliquis 5 mg twice daily. Keep appointments with A. fib clinic. 2. Hypertension, BP controlled.  Continue BP meds as above.  Follow-up in 1 year.    Medication Adjustments/Labs and Tests Ordered: Current medicines are reviewed at length with the patient today.  Concerns regarding medicines are outlined above.  Orders Placed This Encounter  Procedures  . EKG 12-Lead   No orders of the defined types were placed in this encounter.   Patient Instructions  Medication Instructions:  Your physician recommends that you continue on your current medications as directed. Please refer to the Current Medication list given to you today.  *If you need a refill on your cardiac medications before your next appointment, please call your pharmacy*   Lab Work: None ordered If you have labs (blood work) drawn today and your tests are completely normal, you will receive your results only by: Marland Kitchen MyChart Message (if you have MyChart) OR . A paper copy in the mail If you have any lab test that is abnormal or we need to change your treatment, we will call you to review the results.   Testing/Procedures: None ordered   Follow-Up: At [facility], you and your health needs are our priority.  As part of our continuing mission to provide you with exceptional heart care, we have created designated Provider Care Teams.  These Care Teams include your primary Cardiologist (physician) and Advanced Practice Providers (APPs -  Physician Assistants and Nurse Practitioners) who all work together to provide you with the care you need, when you need it.  We recommend signing up for the patient portal called "MyChart".  Sign up information is provided on this After Visit Summary.  MyChart is used to connect with patients for Virtual Visits (Telemedicine).  Patients are able to view lab/test results,  encounter notes, upcoming appointments, etc.  Non-urgent messages can be  sent to your provider as well.   To learn more about what you can do with MyChart, go to [email or web address].    Your next appointment:   12 month(s)  The format for your next appointment:   In Person  Provider:   Kate Sable, MD   Other Instructions      Signed, Kate Sable, MD  07/29/2020 11:54 AM    [place]

## 2020-07-29 NOTE — Patient Instructions (Signed)
Medication Instructions:  Your physician recommends that you continue on your current medications as directed. Please refer to the Current Medication list given to you today.  *If you need a refill on your cardiac medications before your next appointment, please call your pharmacy*   Lab Work: None ordered If you have labs (blood work) drawn today and your tests are completely normal, you will receive your results only by: Marland Kitchen MyChart Message (if you have MyChart) OR . A paper copy in the mail If you have any lab test that is abnormal or we need to change your treatment, we will call you to review the results.   Testing/Procedures: None ordered   Follow-Up: At [facility], you and your health needs are our priority.  As part of our continuing mission to provide you with exceptional heart care, we have created designated Provider Care Teams.  These Care Teams include your primary Cardiologist (physician) and Advanced Practice Providers (APPs -  Physician Assistants and Nurse Practitioners) who all work together to provide you with the care you need, when you need it.  We recommend signing up for the patient portal called "MyChart".  Sign up information is provided on this After Visit Summary.  MyChart is used to connect with patients for Virtual Visits (Telemedicine).  Patients are able to view lab/test results, encounter notes, upcoming appointments, etc.  Non-urgent messages can be sent to your provider as well.   To learn more about what you can do with MyChart, go to [email or web address].    Your next appointment:   12 month(s)  The format for your next appointment:   In Person  Provider:   Kate Sable, MD   Other Instructions

## 2020-08-14 ENCOUNTER — Encounter: Payer: Self-pay | Admitting: Cardiology

## 2020-08-14 ENCOUNTER — Other Ambulatory Visit: Payer: Self-pay

## 2020-08-14 ENCOUNTER — Ambulatory Visit (INDEPENDENT_AMBULATORY_CARE_PROVIDER_SITE_OTHER): Payer: 59 | Admitting: Cardiology

## 2020-08-14 VITALS — BP 128/82 | HR 69 | Ht 66.0 in | Wt 249.0 lb

## 2020-08-14 DIAGNOSIS — Z79899 Other long term (current) drug therapy: Secondary | ICD-10-CM

## 2020-08-14 DIAGNOSIS — I480 Paroxysmal atrial fibrillation: Secondary | ICD-10-CM | POA: Diagnosis not present

## 2020-08-14 DIAGNOSIS — Z5181 Encounter for therapeutic drug level monitoring: Secondary | ICD-10-CM

## 2020-08-14 DIAGNOSIS — I10 Essential (primary) hypertension: Secondary | ICD-10-CM

## 2020-08-14 NOTE — Patient Instructions (Addendum)
Medication Instructions:  Your physician recommends that you continue on your current medications as directed. Please refer to the Current Medication list given to you today. *If you need a refill on your cardiac medications before your next appointment, please call your pharmacy*  Lab Work: None ordered. If you have labs (blood work) drawn today and your tests are completely normal, you will receive your results only by: Marland Kitchen MyChart Message (if you have MyChart) OR . A paper copy in the mail If you have any lab test that is abnormal or we need to change your treatment, we will call you to review the results.  Testing/Procedures: Your physician has requested that you have en exercise stress myoview.   Follow-Up: At [facility], you and your health needs are our priority.  As part of our continuing mission to provide you with exceptional heart care, we have created designated Provider Care Teams.  These Care Teams include your primary Cardiologist (physician) and Advanced Practice Providers (APPs -  Physician Assistants and Nurse Practitioners) who all work together to provide you with the care you need, when you need it.  Your next appointment:   Your physician wants you to follow-up in: 3 months with Dr. Quentin Ore.    [place]  Your caregiver has ordered a Stress Test with nuclear imaging. The purpose of this test is to evaluate the blood supply to your heart muscle. This procedure is referred to as a "Non-Invasive Stress Test." This is because other than having an IV started in your vein, nothing is inserted or "invades" your body. Cardiac stress tests are done to find areas of poor blood flow to the heart by determining the extent of coronary artery disease (CAD). Some patients exercise on a treadmill, which naturally increases the blood flow to your heart, while others who are  unable to walk on a treadmill due to physical limitations have a pharmacologic/chemical stress agent called  Lexiscan . This medicine will mimic walking on a treadmill by temporarily increasing your coronary blood flow.   Please note: these test may take anywhere between 2-4 hours to complete  PLEASE REPORT TO [place] AT THE FIRST DESK WILL DIRECT YOU WHERE TO GO  Date of Procedure:_____________________________________  Arrival Time for Procedure:______________________________  Instructions regarding medication:   On the morning of your procedure TAKE ALL your normal morning medications EXCEPT do NOT take your carvedilol    PLEASE NOTIFY THE OFFICE AT LEAST 24 HOURS IN ADVANCE IF YOU ARE UNABLE TO KEEP YOUR APPOINTMENT.  [phone] AND  PLEASE NOTIFY NUCLEAR MEDICINE AT [facility] AT LEAST 24 HOURS IN ADVANCE IF YOU ARE UNABLE TO KEEP YOUR APPOINTMENT. [phone]  How to prepare for your Myoview test:  1. Do not eat or drink after midnight 2. No caffeine for 24 hours prior to test 3. No smoking 24 hours prior to test. 4. Your medication may be taken with water.  If your doctor stopped a medication because of this test, do not take that medication. 5. Ladies, please do not wear dresses.  Skirts or pants are appropriate. Please wear a short sleeve shirt. 6. No perfume, cologne or lotion. 7. Wear comfortable walking shoes. No heels!     COVID PRE- TEST: You will need a COVID TEST prior to the procedure:  LOCATION: [facility] Testing site.  DATE/TIME:  _______________ (8:00 am- 10:00 am)

## 2020-08-14 NOTE — Progress Notes (Signed)
Electrophysiology Office Note:    Date:  08/14/2020   ID:  [name] Ross, DOB [birth year], MRN [id number]  PCP:  Leanna Battles, MD  [facility] Cardiologist:  Kate Sable, MD  [facility] Electrophysiologist:  Vickie Epley, MD   Referring MD: Arvil Chaco, PA*   Chief Complaint: Paroxysmal atrial fibrillation  History of Present Illness:    [name] Ross is a 55 y.o. female who presents for an evaluation of paroxysmal atrial fibrillation at the request of [name], NP. Their medical history includes hypertension, morbid obesity.  The patient last saw Dr. [name] on July 29, 2020 for her paroxysmal atrial fibrillation.  She also saw [name] in the A. fib clinic on July 19, 2020.  At that appointment flecainide was started.  Prior to the flecainide being started she was having very frequent episodes of symptomatic atrial fibrillation.  She tells me that she would have many episodes of A. fib every day that were highly symptomatic.  They would last minutes at a time.  Once the flecainide was started the symptoms slowed and then have completely resolved for the last week.  She is tolerating the flecainide fairly well.  She originally was hoping to receive an ablation but given the flecainide's effectiveness, she would like to continue using flecainide for the immediate future.  Past Medical History:  Diagnosis Date  . Anal skin tag   . Depression   . Diverticulosis   . Diverticulosis, sigmoid   . Dyslipidemia   . GERD (gastroesophageal reflux disease)   . Hemorrhoids    internal and external  . History of diverticulitis    10/ 2016  . History of galactorrhea   . History of stress test    a. Pt reports h/o stress test and echo by cardiologist in [place] (no records in [place]).  Both reportedly nl.  . Hypertension   . IBS (irritable bowel syndrome)   . Morbid obesity ([place])   . PONV (postoperative nausea and vomiting)    severe  . Rash    elbows  and behind knees  . Restless leg syndrome   . Sleep apnea    cpap sometimes  . Wears glasses     Past Surgical History:  Procedure Laterality Date  . BAND HEMORRHOIDECTOMY  2012   Dr Earlean Shawl  . CESAREAN SECTION  1994  . COLONOSCOPY  06/18/2011  . CYSTO/  BILATERAL PYELOGRAM RETROGRADES/  TRANSOBTURATOR SLING  03/08/2006  . EXCISION OF SKIN TAG N/A 02/13/2016   Procedure: ANAL SKIN TAG EXCISION;  Surgeon: [name], MD;  Location: [facility];  Service: General;  Laterality: N/A;  . LAPAROSCOPIC CHOLECYSTECTOMY  05/22/2001  . SHOULDER SURGERY      Current Medications: Current Meds  Medication Sig  . buPROPion (WELLBUTRIN XL) 300 MG 24 hr tablet Take 300 mg by mouth every morning.   . butalbital-acetaminophen-caffeine (FIORICET) 50-325-40 MG tablet Take by mouth 2 (two) times daily as needed for headache.  . Calcium Carbonate-Vit D-Min (CALCIUM 1200 PO) Take by mouth.  . carvedilol (COREG) 25 MG tablet Take 1.5 tablets (37.5 mg total) by mouth 2 (two) times daily.  . cholecalciferol (VITAMIN D3) 25 MCG (1000 UNIT) tablet Take 1,000 Units by mouth daily.  Marland Kitchen dicyclomine (BENTYL) 10 MG capsule Take one tablet by mouth once to twice daily as needed  . ELIQUIS 5 MG TABS tablet TAKE 1 TABLET BY MOUTH TWICE DAILY  . escitalopram (LEXAPRO) 20 MG tablet Take 20  mg by mouth every morning.   . fenofibrate micronized (LOFIBRA) 200 MG capsule Take 200 mg by mouth daily before breakfast.   . flecainide (TAMBOCOR) 50 MG tablet Take 1 tablet (50 mg total) by mouth 2 (two) times daily.  Marland Kitchen loratadine (ALAVERT) 10 MG dissolvable tablet Take 1 tablet by mouth daily.  Marland Kitchen losartan (COZAAR) 100 MG tablet Take 100 mg by mouth every morning.   . meclizine (ANTIVERT) 25 MG tablet Take 1 tablet (25 mg total) by mouth 3 (three) times daily as needed for dizziness.  . medroxyPROGESTERone (PROVERA) 2.5 MG tablet Take 2.5 mg by mouth daily.  . meloxicam (MOBIC) 15 MG tablet Take 15 mg by mouth  daily.  . mometasone (ELOCON) 0.1 % ointment Apply topically 2 (two) times daily.  Marland Kitchen omega-3 acid ethyl esters (LOVAZA) 1 g capsule Take 1 capsule (1 g total) by mouth 2 (two) times daily.  Marland Kitchen omeprazole (PRILOSEC) 20 MG capsule Take 20 mg by mouth daily.  . Prenatal 27-1 MG TABS Take 1 tablet by mouth daily.  Marland Kitchen PROGESTERONE PO Take 200 mg by mouth at bedtime.  . triamcinolone (KENALOG) 0.1 % Apply topically 2 (two) times daily.  Marland Kitchen triamterene-hydrochlorothiazide (MAXZIDE-25) 37.5-25 MG per tablet Take 0.5 tablets by mouth every morning.   . valACYclovir (VALTREX) 1000 MG tablet Take 1,000 mg by mouth 2 (two) times daily as needed.   . vitamin B-12 (CYANOCOBALAMIN) 1000 MCG tablet Take 1,000 mcg by mouth daily.  Marland Kitchen zolpidem (AMBIEN) 10 MG tablet Take 10 mg by mouth at bedtime as needed for sleep.     Allergies:   Venlafaxine   Social History   Socioeconomic History  . Marital status: Married    Spouse name: [name]  . Number of children: 2  . Years of education: Therapist, sports  . Highest education level: Not on file  Occupational History  . Occupation: Therapist, sports  Tobacco Use  . Smoking status: Never Smoker  . Smokeless tobacco: Never Used  Vaping Use  . Vaping Use: Never used  Substance and Sexual Activity  . Alcohol use: No    Alcohol/week: 0.0 standard drinks  . Drug use: No  . Sexual activity: Not on file  Other Topics Concern  . Not on file  Social History Narrative   Lives in [place].  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  Occasionally drinks coffee but not a heavy caffeine drinker.     Social Determinants of Health   Financial Resource Strain: Not on file  Food Insecurity: Not on file  Transportation Needs: Not on file  Physical Activity: Not on file  Stress: Not on file  Social Connections: Not on file     Family History: The patient's family history includes Atrial fibrillation in her mother; Breast cancer in an other family member; Colon cancer in her mother;  Diabetes in her father, mother, sister, and sister; Hypertension in her father and mother; Skin cancer in her father; Suicidality in her father.  ROS:   Please see the history of present illness.    All other systems reviewed and are negative.  EKGs/Labs/Other Studies Reviewed:    The following studies were reviewed today:  07/24/2020 Monitor Patient had a min HR of 53 bpm, max HR of 197 bpm, and avg HR of 83 bpm. Predominant underlying rhythm was Sinus Rhythm. 148 Supraventricular Tachycardia runs occurred, the run with the fastest interval lasting 4 beats with a max rate of 197 bpm, the  longest lasting  13 beats with an avg rate of 127 bpm. Atrial Fibrillation occurred (<1% burden), ranging from 74-166 bpm (avg of 112 bpm), the longest lasting 28 mins 17 secs with an avg rate of 113 bpm. Supraventricular Tachycardia and Atrial  Fibrillation were detected within +/- 45 seconds of symptomatic patient event(s).     EKG:  The ekg ordered today demonstrates sinus rhythm with a PR interval of 176 ms, QRS duration of 80 ms.  Recent Labs: 12/19/2019: TSH 3.607 12/20/2019: BUN 16; Creatinine, Ser 0.75; Potassium 3.9; Sodium 136 01/25/2020: Hemoglobin 11.5; Magnesium 1.6; Platelets 455  Recent Lipid Panel    Component Value Date/Time   CHOL 204 (H) 12/20/2019 0502   TRIG 510 (H) 12/20/2019 0502   HDL 39 (L) 12/20/2019 0502   CHOLHDL 5.2 12/20/2019 0502   VLDL UNABLE TO CALCULATE IF TRIGLYCERIDE OVER 400 mg/dL 12/20/2019 0502   LDLCALC UNABLE TO CALCULATE IF TRIGLYCERIDE OVER 400 mg/dL 12/20/2019 0502   LDLDIRECT 66.8 12/20/2019 0502    Physical Exam:    VS:  BP 128/82   Pulse 69   Ht 5\' 6"  (1.676 m)   Wt 249 lb (112.9 kg)   SpO2 95%   BMI 40.19 kg/m     Wt Readings from Last 3 Encounters:  08/14/20 249 lb (112.9 kg)  07/29/20 248 lb (112.5 kg)  07/19/20 246 lb 3.2 oz (111.7 kg)     GEN:  Well nourished, well developed in no acute distress.  Morbidly obese HEENT: Normal NECK:  No JVD; No carotid bruits LYMPHATICS: No lymphadenopathy CARDIAC: RRR, no murmurs, rubs, gallops RESPIRATORY:  Clear to auscultation without rales, wheezing or rhonchi  ABDOMEN: Soft, non-tender, non-distended MUSCULOSKELETAL:  No edema; No deformity  SKIN: Warm and dry NEUROLOGIC:  Alert and oriented x 3 PSYCHIATRIC:  Normal affect   ASSESSMENT:    1. Paroxysmal atrial fibrillation (HCC)   2. Encounter for monitoring flecainide therapy    PLAN:    In order of problems listed above:  1. Symptomatic paroxysmal atrial fibrillation CHA2DS2-VASc of at least 2 for hypertension and gender.  On Eliquis for stroke prophylaxis.  Symptoms have improved significantly on flecainide.  She also takes diltiazem twice daily.  I discussed the treatment options available to her including continuing with antiarrhythmic therapy versus ablation.  I think given her age and paroxysmal nature of the atrial fibrillation a rhythm control strategy is clearly indicated.  I think it is very reasonable to continue with flecainide at the current time given she is tolerating the medication and it is proven effective.  I have encouraged her to try to lose some weight so that ablation therapy will be an option in the future if the flecainide becomes ineffective.  I discussed the ablation procedure in detail with the patient during today's visit including the risks, expected recovery time and effectiveness compared to antiarrhythmic therapy.  While I think she is a candidate now for ablation, the effectiveness of the procedure would clearly improve if her weight were to decrease.  2.  Morbid obesity We spent a great deal of time during today's clinic appointment discussing weight loss strategies.  Some initial steps we discussed were to transition from sweetened beverages to unsweet tea, Diet Coke and Crystal light instead of fruit juices.  3.  High risk medication monitoring I have ordered a stress test to rule out  evidence of ischemic heart disease given the use of flecainide.  EKG shows normal PR and QRS duration.  I will plan  to see her back in clinic in about 3 months to see how she is doing.  Total time spent with patient today 70 minutes. This includes reviewing records, prior testing, evaluating the patient and coordinating care.  Medication Adjustments/Labs and Tests Ordered: Current medicines are reviewed at length with the patient today.  Concerns regarding medicines are outlined above.  Orders Placed This Encounter  Procedures  . NM Myocar Single W/Spect W/Wall Motion And EF  . EKG 12-Lead   No orders of the defined types were placed in this encounter.    Signed, Lars Mage, MD, [facility]  08/14/2020 6:27 PM    Electrophysiology [place]

## 2020-08-19 ENCOUNTER — Other Ambulatory Visit: Payer: Self-pay | Admitting: *Deleted

## 2020-08-19 ENCOUNTER — Other Ambulatory Visit: Payer: Self-pay | Admitting: Family

## 2020-08-19 DIAGNOSIS — Z7901 Long term (current) use of anticoagulants: Secondary | ICD-10-CM

## 2020-08-19 NOTE — Telephone Encounter (Signed)
Orders placed for collection at the [facility] on date of her COVID testing.   Will ask nursing team to call and make her awar.e   Loel Dubonnet, NP

## 2020-08-19 NOTE — Telephone Encounter (Signed)
Prescription refill request for Eliquis received. Indication:  A fib Last office visit: 08/14/20 Scr: 0.75 on 12/20/19  (Labs requested) Age: 55 Weight:  112.9kg  Based on above findings Eliquis 5mg  twice daily is the appropriate dose.  Refill approved x 3.  Pt does need current CBC/BMP for kidney function.  Message routed back to sender to put in to have labs drawn with pending Covid test in April.

## 2020-08-19 NOTE — Telephone Encounter (Signed)
Spoke with patient and reviewed need for repeat labs on day she comes in for her COVID swab. Instructed her to check in at the [facility] registration desk and they would then direct her where to go. Once those are done she can then go to have her COVID swab done. She verbalized understanding of instructions, agreeable with plan, and had no further questions at this time.

## 2020-08-19 NOTE — Telephone Encounter (Signed)
Please see message below. Can you help with these orders?

## 2020-08-19 NOTE — Telephone Encounter (Signed)
Please review for refill. Thanks!  

## 2020-08-19 NOTE — Telephone Encounter (Signed)
Patient needs a CBC/BMP for her Eliquis refills when she gets her Covid test in April.  Will you take care of those orders or send to the appropriate person.  Refill approved. Thanks, Lattie Haw

## 2020-08-20 ENCOUNTER — Telehealth: Payer: Self-pay | Admitting: Cardiology

## 2020-08-20 NOTE — Telephone Encounter (Signed)
Per Betsy Pries at [facility] med :    Looking ahead at our schedule; I noticed that this patient's NM Myocardial Perfusion treadmill test was ordered as a "NM Myocar Single w/SPECT". Usually we do both Resting & Stress parts which is a "NM Myocar Multi w/SPECT". Do you mind changing this & make sure insurance has authorized as a Dispensing optician w/SPECT". Thanks!

## 2020-08-21 NOTE — Telephone Encounter (Signed)
Entered correct study as requested.  Unable to delete incorrect test.  Sent message to precert advising of new test ordered.

## 2020-08-21 NOTE — Addendum Note (Signed)
Addended by: Willeen Cass A on: 08/21/2020 08:00 AM   Modules accepted: Orders

## 2020-08-29 ENCOUNTER — Other Ambulatory Visit
Admission: RE | Admit: 2020-08-29 | Discharge: 2020-08-29 | Disposition: A | Discharge status: Home / Self Care | Payer: 59 | Source: Home / Self Care | Attending: Family | Admitting: Family

## 2020-08-29 ENCOUNTER — Other Ambulatory Visit
Admission: RE | Admit: 2020-08-29 | Discharge: 2020-08-29 | Disposition: A | Discharge status: Home / Self Care | Payer: 59 | Source: Ambulatory Visit | Attending: Cardiology | Admitting: Cardiology

## 2020-08-29 ENCOUNTER — Other Ambulatory Visit: Payer: Self-pay

## 2020-08-29 DIAGNOSIS — Z01812 Encounter for preprocedural laboratory examination: Secondary | ICD-10-CM | POA: Diagnosis present

## 2020-08-29 DIAGNOSIS — Z20822 Contact with and (suspected) exposure to covid-19: Secondary | ICD-10-CM | POA: Diagnosis not present

## 2020-08-29 DIAGNOSIS — Z7901 Long term (current) use of anticoagulants: Secondary | ICD-10-CM | POA: Insufficient documentation

## 2020-08-29 LAB — CBC
HCT: 34.4 % — ABNORMAL LOW (ref 36.0–46.0)
Hemoglobin: 11.8 g/dL — ABNORMAL LOW (ref 12.0–15.0)
MCH: 30.6 pg (ref 26.0–34.0)
MCHC: 34.3 g/dL (ref 30.0–36.0)
MCV: 89.4 fL (ref 80.0–100.0)
Platelets: 475 10*3/uL — ABNORMAL HIGH (ref 150–400)
RBC: 3.85 MIL/uL — ABNORMAL LOW (ref 3.87–5.11)
RDW: 13.4 % (ref 11.5–15.5)
WBC: 7.3 10*3/uL (ref 4.0–10.5)
nRBC: 0 % (ref 0.0–0.2)

## 2020-08-29 LAB — BASIC METABOLIC PANEL
Anion gap: 11 (ref 5–15)
BUN: 24 mg/dL — ABNORMAL HIGH (ref 6–20)
CO2: 22 mmol/L (ref 22–32)
Calcium: 9.3 mg/dL (ref 8.9–10.3)
Chloride: 104 mmol/L (ref 98–111)
Creatinine, Ser: 0.87 mg/dL (ref 0.44–1.00)
GFR, Estimated: >60 mL/min (ref >60)
Glucose, Bld: 118 mg/dL — ABNORMAL HIGH (ref 70–99)
Potassium: 3.7 mmol/L (ref 3.5–5.1)
Sodium: 137 mmol/L (ref 135–145)

## 2020-08-30 ENCOUNTER — Ambulatory Visit
Admission: RE | Admit: 2020-08-30 | Discharge: 2020-08-30 | Disposition: A | Discharge status: Home / Self Care | Payer: 59 | Source: Ambulatory Visit | Attending: Cardiology | Admitting: Cardiology

## 2020-08-30 ENCOUNTER — Other Ambulatory Visit: Payer: Self-pay

## 2020-08-30 DIAGNOSIS — Z79899 Other long term (current) drug therapy: Secondary | ICD-10-CM | POA: Diagnosis not present

## 2020-08-30 DIAGNOSIS — Z5181 Encounter for therapeutic drug level monitoring: Secondary | ICD-10-CM | POA: Insufficient documentation

## 2020-08-30 DIAGNOSIS — I480 Paroxysmal atrial fibrillation: Secondary | ICD-10-CM | POA: Insufficient documentation

## 2020-08-30 LAB — NM MYOCAR MULTI W/SPECT W/WALL MOTION / EF
Estimated workload: 7 METS
Exercise duration (min): 5 min
Exercise duration (sec): 0 s
LV dias vol: 77 mL (ref 46–106)
LV sys vol: 25 mL
MPHR: 165 {beats}/min
Peak HR: 101 {beats}/min
Percent HR: 61 %
Rest HR: 88 {beats}/min
SDS: 4
SRS: 2
SSS: 5
TID: 0.96

## 2020-08-30 LAB — SARS CORONAVIRUS 2 (TAT 6-24 HRS): SARS Coronavirus 2: NEGATIVE

## 2020-08-30 MED ORDER — TECHNETIUM TC 99M TETROFOSMIN IV KIT
10.5000 | PACK | Freq: Once | INTRAVENOUS | Status: AC | PRN
  Administered 2020-08-30: 10.5 via INTRAVENOUS

## 2020-08-30 MED ORDER — TECHNETIUM TC 99M TETROFOSMIN IV KIT
31.4900 | PACK | Freq: Once | INTRAVENOUS | Status: AC | PRN
  Administered 2020-08-30: 31.49 via INTRAVENOUS

## 2020-08-30 MED ORDER — REGADENOSON 0.4 MG/5ML IV SOLN
0.4000 mg | Freq: Once | INTRAVENOUS | Status: AC
  Administered 2020-08-30: 0.4 mg via INTRAVENOUS

## 2020-09-03 ENCOUNTER — Ambulatory Visit: Payer: 59 | Admitting: Gastroenterology

## 2020-10-08 ENCOUNTER — Ambulatory Visit: Payer: 59 | Admitting: Gastroenterology

## 2020-10-10 ENCOUNTER — Ambulatory Visit: Payer: 59 | Admitting: Gastroenterology

## 2020-10-10 NOTE — Progress Notes (Deleted)
HPI :  55 year old female with a history of IBS C, hemorrhoids, GERD, previously seen in our office last in April 2017, here for reassessment of her bowel function and reflux..  She has had longstanding constipation.  She has been tried on MiraLAX and Linzess in the past.  Linzess worked initially quite well for her however then led to severe diarrhea, she thought it was too strong for her and stopped it.  We had recommended a trial of a fiber supplement at her last visit, she states that did not help her too much and ultimately stopped taking it.  She is currently taking MiraLAX as needed.  She has a bowel movement perhaps every other day or so.  She continues to have a lot of bloating and distention of her abdomen from gas that bothers her periodically.  She does find some benefit from dicyclomine when she takes it, although she does not take it too frequently.  She states that she does have reliable relief of her symptoms with a bowel movement.  She denies any blood in her stools.  She feels like sometimes she can evacuate herself completely when she tries to use the bathroom and needs to have multiple trips to get everything out.  She occasionally does pass very hard stools.  She otherwise previously was on Protonix 40 mg once a day and Zantac at night for reflux.  Over time her regimen is changed and she has been decreased to omeprazole 20 mg a day.  This generally works well to control her reflux symptoms.  She denies any dysphagia.  She is never had any prior tobacco history or family history of esophagus cancer.  Her mother had a history of carcinoid tumor of the bowel.  She is never had a prior EGD.  She also has had prior occurrence of diverticulitis 4 times in the past. She has been offered surgery but declined at this time. No FH of CRC.   Colonoscopy 05/2011 - diverticulosis, otherwise normal. No adenomas.   Labs 02/2019 - Hgb 13.0, plt 416, WBC 74.13  55 year old female here for  assessment of the following issues:  IBS-C / bloating - we have not seen her in the past almost 4 years, trials of Linzess that dosed at 145 mcg a day in the past have been too strong.  She does not like taking MiraLAX routinely, fiber supplementation unfortunately has not helped much.  She continues to have a lot of bloating.  We discussed options to treat this.  I would recommend a trial of lower dose Linzess at 72 mcg a day and see if this provides benefit without being too strong.  She was agreeable to a trial and I provided is free sample for her.  If this works for her asked her to call me back in a couple weeks and we can give her a prescription for it.  Bentyl helps her reliably, I recommend she use 10 mg tab, 1-2 tabs every 8 hours as needed.  I encouraged her to use this if she is having particularly painful cramps, she does not use it too often.  Otherwise to help reduce her bloating, hopefully treatment of the constipation will help this more than anything, however I also counseled her on a low FODMAP diet to see if that might help reduce her gas as well.  We may consider a probiotic moving forward as well if bloating persists.  She is due for her screening colonoscopy in January 2023.  Labs assuring without anemia.  She agreed with the plan  GERD - doing pretty well with Prilosec 20 mg a day.  Previously was on higher dose Protonix and Zantac for control of symptoms.  I do not feel strongly that she warrants an EGD right now, she has no alarm symptoms, no significant risk factors for Barrett's esophagus.  We did discuss long-term risks and benefits of chronic PPI use.  Over time recommend the lowest dose needed to control symptoms or use of an alternative.  Unfortunately H2 blocker monotherapy has not provided benefit.  She will continue Prilosec 20 mg a day as needed moving forward.  She should follow-up with [place] once yearly for these issues, or sooner with additional concerns.    Nuclear  stress test 08/30/20 - low risk study, normal EF       Past Medical History:  Diagnosis Date  . Anal skin tag   . Depression   . Diverticulosis   . Diverticulosis, sigmoid   . Dyslipidemia   . GERD (gastroesophageal reflux disease)   . Hemorrhoids    internal and external  . History of diverticulitis    10/ 2016  . History of galactorrhea   . History of stress test    a. Pt reports h/o stress test and echo by cardiologist in [place] (no records in [place]).  Both reportedly nl.  . Hypertension   . IBS (irritable bowel syndrome)   . Morbid obesity ([place])   . PONV (postoperative nausea and vomiting)    severe  . Rash    elbows and behind knees  . Restless leg syndrome   . Sleep apnea    cpap sometimes  . Wears glasses      Past Surgical History:  Procedure Laterality Date  . BAND HEMORRHOIDECTOMY  2012   Dr Earlean Shawl  . CESAREAN SECTION  1994  . COLONOSCOPY  06/18/2011  . CYSTO/  BILATERAL PYELOGRAM RETROGRADES/  TRANSOBTURATOR SLING  03/08/2006  . EXCISION OF SKIN TAG N/A 02/13/2016   Procedure: ANAL SKIN TAG EXCISION;  Surgeon: [name], MD;  Location[facility];  Service: General;  Laterality: N/A;  . LAPAROSCOPIC CHOLECYSTECTOMY  05/22/2001  . SHOULDER SURGERY     Family History  Problem Relation Age of Onset  . Diabetes Mother   . Hypertension Mother   . Atrial fibrillation Mother   . Colon cancer Mother   . Diabetes Father        committed suicide  . Skin cancer Father   . Suicidality Father   . Hypertension Father   . Diabetes Sister   . Diabetes Sister   . Breast cancer Other        materanl great aunt   Social History   Tobacco Use  . Smoking status: Never Smoker  . Smokeless tobacco: Never Used  Vaping Use  . Vaping Use: Never used  Substance Use Topics  . Alcohol use: No    Alcohol/week: 0.0 standard drinks  . Drug use: No   Current Outpatient Medications  Medication Sig Dispense Refill  . buPROPion (WELLBUTRIN XL) 300 MG  24 hr tablet Take 300 mg by mouth every morning.     . butalbital-acetaminophen-caffeine (FIORICET) 50-325-40 MG tablet Take by mouth 2 (two) times daily as needed for headache.    . Calcium Carbonate-Vit D-Min (CALCIUM 1200 PO) Take by mouth.    . carvedilol (COREG) 25 MG tablet Take 1.5 tablets (37.5 mg total) by mouth 2 (two) times  daily. 90 tablet 3  . cholecalciferol (VITAMIN D3) 25 MCG (1000 UNIT) tablet Take 1,000 Units by mouth daily.    Marland Kitchen dicyclomine (BENTYL) 10 MG capsule Take one tablet by mouth once to twice daily as needed 90 capsule 3  . diltiazem (CARDIZEM CD) 180 MG 24 hr capsule Take 1 capsule (180 mg total) by mouth in the morning and at bedtime. 180 capsule 2  . ELIQUIS 5 MG TABS tablet TAKE 1 TABLET BY MOUTH TWICE DAILY 60 tablet 3  . escitalopram (LEXAPRO) 20 MG tablet Take 20 mg by mouth every morning.     . fenofibrate micronized (LOFIBRA) 200 MG capsule Take 200 mg by mouth daily before breakfast.   4  . flecainide (TAMBOCOR) 50 MG tablet Take 1 tablet (50 mg total) by mouth 2 (two) times daily. 60 tablet 3  . loratadine (ALAVERT) 10 MG dissolvable tablet Take 1 tablet by mouth daily.    Marland Kitchen losartan (COZAAR) 100 MG tablet Take 100 mg by mouth every morning.   3  . meclizine (ANTIVERT) 25 MG tablet Take 1 tablet (25 mg total) by mouth 3 (three) times daily as needed for dizziness. 30 tablet 0  . medroxyPROGESTERone (PROVERA) 2.5 MG tablet Take 2.5 mg by mouth daily.    . meloxicam (MOBIC) 15 MG tablet Take 15 mg by mouth daily.    . mometasone (ELOCON) 0.1 % ointment Apply topically 2 (two) times daily.    Marland Kitchen omega-3 acid ethyl esters (LOVAZA) 1 g capsule Take 1 capsule (1 g total) by mouth 2 (two) times daily. 60 capsule 5  . omeprazole (PRILOSEC) 20 MG capsule Take 20 mg by mouth daily.    . Prenatal 27-1 MG TABS Take 1 tablet by mouth daily.    Marland Kitchen PROGESTERONE PO Take 200 mg by mouth at bedtime.    . triamcinolone (KENALOG) 0.1 % Apply topically 2 (two) times daily.    Marland Kitchen  triamterene-hydrochlorothiazide (MAXZIDE-25) 37.5-25 MG per tablet Take 0.5 tablets by mouth every morning.     . valACYclovir (VALTREX) 1000 MG tablet Take 1,000 mg by mouth 2 (two) times daily as needed.     . vitamin B-12 (CYANOCOBALAMIN) 1000 MCG tablet Take 1,000 mcg by mouth daily.    Marland Kitchen zolpidem (AMBIEN) 10 MG tablet Take 10 mg by mouth at bedtime as needed for sleep.     No current facility-administered medications for this visit.   Allergies  Allergen Reactions  . Venlafaxine     Other reaction(s): sleepy     Review of Systems: All systems reviewed and negative except where noted in HPI.    No results found.  Physical Exam: There were no vitals taken for this visit. Constitutional: Pleasant,well-developed, ***female in no acute distress. HEENT: Normocephalic and atraumatic. Conjunctivae are normal. No scleral icterus. Neck supple.  Cardiovascular: Normal rate, regular rhythm.  Pulmonary/chest: Effort normal and breath sounds normal. No wheezing, rales or rhonchi. Abdominal: Soft, nondistended, nontender. Bowel sounds active throughout. There are no masses palpable. No hepatomegaly. Extremities: no edema Lymphadenopathy: No cervical adenopathy noted. Neurological: Alert and oriented to person place and time. Skin: Skin is warm and dry. No rashes noted. Psychiatric: Normal mood and affect. Behavior is normal.   ASSESSMENT AND PLAN:  Leanna Battles, MD

## 2020-10-28 ENCOUNTER — Telehealth: Payer: Self-pay | Admitting: Cardiology

## 2020-10-28 MED ORDER — FLECAINIDE ACETATE 50 MG PO TABS: 50.0000 mg | ORAL_TABLET | Freq: Two times a day (BID) | ORAL | 0 refills | Status: DC

## 2020-10-28 NOTE — Telephone Encounter (Signed)
Requested Prescriptions   Signed Prescriptions Disp Refills   flecainide (TAMBOCOR) 50 MG tablet 180 tablet 0    Sig: Take 1 tablet (50 mg total) by mouth 2 (two) times daily.    Authorizing Provider: LAMBERT, CAMERON T    Ordering User: NEWCOMER MCCLAIN, Nyonna Hargrove L    

## 2020-10-28 NOTE — Telephone Encounter (Signed)
*  STAT* If patient is at the pharmacy, call can be transferred to refill team.   1. Which medications need to be refilled? (please list name of each medication and dose if known)  Flecainide 50 mg po BID  2. Which pharmacy/location (including street and city if local pharmacy) is medication to be sent to? [place]   3. Do they need a 30 day or 90 day supply? [place]

## 2020-11-05 NOTE — Progress Notes (Deleted)
Electrophysiology Office Follow up Visit Note:    Date:  11/05/2020   ID:  [name] Ross, DOB April 04, 1966, MRN [id number]  PCP:  Leanna Battles, MD  [facility] Cardiologist:  [place]  [id number] [facility] Electrophysiologist:  Vickie Epley, MD    Interval History:    [name] Ross is a 55 y.o. female who presents for a follow up visit for her paroxysmal atrial fibrillation.  I last saw the patient August 14, 2020.  She takes flecainide to help control her atrial fibrillation.  At her last appointment we discussed using flecainide and in the short-term to help control her heart rhythm.  I do think that she would be a candidate long-term for ablation but we discussed her losing some weight to try to help improve the potential efficacy of such a procedure.  Weight loss would also help improve the efficacy of antiarrhythmic therapy.     Past Medical History:  Diagnosis Date   Anal skin tag    Depression    Diverticulosis    Diverticulosis, sigmoid    Dyslipidemia    GERD (gastroesophageal reflux disease)    Hemorrhoids    internal and external   History of diverticulitis    10/ 2016   History of galactorrhea    History of stress test    a. Pt reports h/o stress test and echo by cardiologist in [place] (no records in [place]).  Both reportedly nl.   Hypertension    IBS (irritable bowel syndrome)    Morbid obesity ([facility])    PONV (postoperative nausea and vomiting)    severe   Rash    elbows and behind knees   Restless leg syndrome    Sleep apnea    cpap sometimes   Wears glasses     Past Surgical History:  Procedure Laterality Date   BAND HEMORRHOIDECTOMY  2012   Dr Earlean Shawl   CESAREAN SECTION  1994   COLONOSCOPY  06/18/2011   CYSTO/  BILATERAL PYELOGRAM RETROGRADES/  TRANSOBTURATOR SLING  03/08/2006   EXCISION OF SKIN TAG N/A 02/13/2016   Procedure: ANAL SKIN TAG EXCISION;  Surgeon: [name], MD;  Location: [facility];  Service: General;   Laterality: N/A;   LAPAROSCOPIC CHOLECYSTECTOMY  05/22/2001   SHOULDER SURGERY      Current Medications: No outpatient medications have been marked as taking for the 11/06/20 encounter (Appointment) with Vickie Epley, MD.     Allergies:   Venlafaxine   Social History   Socioeconomic History   Marital status: Married    Spouse name: Doctor, general practice   Number of children: 2   Years of education: RN   Highest education level: Not on file  Occupational History   Occupation: RN  Tobacco Use   Smoking status: Never   Smokeless tobacco: Never  Vaping Use   Vaping Use: Never used  Substance and Sexual Activity   Alcohol use: No    Alcohol/week: 0.0 standard drinks   Drug use: No   Sexual activity: Not on file  Other Topics Concern   Not on file  Social History Narrative   Lives in [place].  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  Occasionally drinks coffee but not a heavy caffeine drinker.     Social Determinants of Health   Financial Resource Strain: Not on file  Food Insecurity: Not on file  Transportation Needs: Not on file  Physical Activity: Not on file  Stress: Not on  file  Social Connections: Not on file     Family History: The patient's family history includes Atrial fibrillation in her mother; Breast cancer in an other family member; Colon cancer in her mother; Diabetes in her father, mother, sister, and sister; Hypertension in her father and mother; Skin cancer in her father; Suicidality in her father.  ROS:   Please see the history of present illness.    All other systems reviewed and are negative.  EKGs/Labs/Other Studies Reviewed:    The following studies were reviewed today:   EKG:  The ekg ordered today demonstrates ***  Recent Labs: 12/19/2019: TSH 3.607 01/25/2020: Magnesium 1.6 08/29/2020: BUN 24; Creatinine, Ser 0.87; Hemoglobin 11.8; Platelets 475; Potassium 3.7; Sodium 137  Recent Lipid Panel    Component Value Date/Time   CHOL 204  (H) 12/20/2019 0502   TRIG 510 (H) 12/20/2019 0502   HDL 39 (L) 12/20/2019 0502   CHOLHDL 5.2 12/20/2019 0502   VLDL UNABLE TO CALCULATE IF TRIGLYCERIDE OVER 400 mg/dL 12/20/2019 0502   LDLCALC UNABLE TO CALCULATE IF TRIGLYCERIDE OVER 400 mg/dL 12/20/2019 0502   LDLDIRECT 66.8 12/20/2019 0502    Physical Exam:    VS:  There were no vitals taken for this visit.    Wt Readings from Last 3 Encounters:  08/14/20 249 lb (112.9 kg)  07/29/20 248 lb (112.5 kg)  07/19/20 246 lb 3.2 oz (111.7 kg)     GEN: *** Well nourished, well developed in no acute distress HEENT: Normal NECK: No JVD; No carotid bruits LYMPHATICS: No lymphadenopathy CARDIAC: ***RRR, no murmurs, rubs, gallops RESPIRATORY:  Clear to auscultation without rales, wheezing or rhonchi  ABDOMEN: Soft, non-tender, non-distended MUSCULOSKELETAL:  No edema; No deformity  SKIN: Warm and dry NEUROLOGIC:  Alert and oriented x 3 PSYCHIATRIC:  Normal affect   ASSESSMENT:    No diagnosis found. PLAN:    In order of problems listed above:    Total time spent with patient today *** minutes. This includes reviewing records, evaluating the patient and coordinating care.   Medication Adjustments/Labs and Tests Ordered: Current medicines are reviewed at length with the patient today.  Concerns regarding medicines are outlined above.  No orders of the defined types were placed in this encounter.  No orders of the defined types were placed in this encounter.    Signed, Lars Mage, MD, [facility], [facility] 11/05/2020 6:01 PM    Electrophysiology [facility]

## 2020-11-06 ENCOUNTER — Ambulatory Visit: Payer: 59 | Admitting: Cardiology

## 2020-11-07 ENCOUNTER — Encounter: Payer: Self-pay | Admitting: Cardiology

## 2020-11-21 ENCOUNTER — Other Ambulatory Visit: Payer: Self-pay

## 2020-11-21 MED ORDER — DILTIAZEM HCL ER COATED BEADS 180 MG PO CP24: 180.0000 mg | ORAL_CAPSULE | Freq: Two times a day (BID) | ORAL | 1 refills | Status: DC

## 2020-11-21 NOTE — Telephone Encounter (Signed)
This is a [place] pt 

## 2020-12-17 ENCOUNTER — Other Ambulatory Visit: Payer: Self-pay | Admitting: Family

## 2020-12-17 NOTE — Telephone Encounter (Signed)
Rx(s) sent to pharmacy electronically.  

## 2020-12-18 ENCOUNTER — Ambulatory Visit: Payer: 59 | Admitting: Cardiology

## 2020-12-25 ENCOUNTER — Encounter: Payer: Self-pay | Admitting: Cardiology

## 2020-12-25 ENCOUNTER — Ambulatory Visit (INDEPENDENT_AMBULATORY_CARE_PROVIDER_SITE_OTHER): Payer: 59 | Admitting: Cardiology

## 2020-12-25 ENCOUNTER — Other Ambulatory Visit: Payer: Self-pay

## 2020-12-25 VITALS — BP 130/98 | HR 86 | Ht 66.0 in | Wt 230.0 lb

## 2020-12-25 DIAGNOSIS — Z79899 Other long term (current) drug therapy: Secondary | ICD-10-CM

## 2020-12-25 DIAGNOSIS — I480 Paroxysmal atrial fibrillation: Secondary | ICD-10-CM

## 2020-12-25 DIAGNOSIS — I10 Essential (primary) hypertension: Secondary | ICD-10-CM

## 2020-12-25 NOTE — Patient Instructions (Addendum)
Medication Instructions:  Your physician recommends that you continue on your current medications as directed. Please refer to the Current Medication list given to you today. *If you need a refill on your cardiac medications before your next appointment, please call your pharmacy*  Lab Work: None ordered. If you have labs (blood work) drawn today and your tests are completely normal, you will receive your results only by: [place] (if you have MyChart) OR A paper copy in the mail If you have any lab test that is abnormal or we need to change your treatment, we will call you to review the results.  Testing/Procedures: None ordered.  Follow-Up: At [facility], you and your health needs are our priority.  As part of our continuing mission to provide you with exceptional heart care, we have created designated Provider Care Teams.  These Care Teams include your primary Cardiologist (physician) and Advanced Practice Providers (APPs -  Physician Assistants and Nurse Practitioners) who all work together to provide you with the care you need, when you need it.  Your next appointment:   Your physician wants you to follow-up in: 6 months with one of the following Advanced Practice Providers on your designated Care Team:   Murray Hodgkins, NP [name], PA-C Marrianne Mood, PA-C Cadence Kathlen Mody, [place]  Your physician wants you to follow-up in: one year with Dr. Quentin Ore.   You will receive a reminder letter in the mail two months in advance. If you don't receive a letter, please call our office to schedule the follow-up appointment.

## 2020-12-25 NOTE — Progress Notes (Signed)
Electrophysiology Office Follow up Visit Note:    Date:  12/25/2020   ID:  [name], DOB [birth year], MRN [id number]  PCP:  Leanna Battles, MD  [facility] Cardiologist:  Kate Sable, MD  [facility] Electrophysiologist:  Vickie Epley, MD    Interval History:    [name] Ross is a 55 y.o. female who presents for a follow up visit. They were last seen in clinic 08/14/2020 for her paroxysmal atrial fibrillation.  She takes flecainide for her arrhythmia.  At the last appointment we discussed continuing flecainide versus pursuing ablation for more durable rhythm control.  Today she tells me she has been doing well.  She is tolerating the flecainide without any off target effects.  She has experienced some vaginal bleeding.  She saw a gynecologist who thinks she may have a polyp.  They are planning to do a D&C to address this.   Past Medical History:  Diagnosis Date   Anal skin tag    Depression    Diverticulosis    Diverticulosis, sigmoid    Dyslipidemia    GERD (gastroesophageal reflux disease)    Hemorrhoids    internal and external   History of diverticulitis    10/ 2016   History of galactorrhea    History of stress test    a. Pt reports h/o stress test and echo by cardiologist in [place] (no records in [place]).  Both reportedly nl.   Hypertension    IBS (irritable bowel syndrome)    Morbid obesity (HCC)    PONV (postoperative nausea and vomiting)    severe   Rash    elbows and behind knees   Restless leg syndrome    Sleep apnea    cpap sometimes   Wears glasses     Past Surgical History:  Procedure Laterality Date   BAND HEMORRHOIDECTOMY  2012   Dr Earlean Shawl   CESAREAN SECTION  1994   COLONOSCOPY  06/18/2011   CYSTO/  BILATERAL PYELOGRAM RETROGRADES/  TRANSOBTURATOR SLING  03/08/2006   EXCISION OF SKIN TAG N/A 02/13/2016   Procedure: ANAL SKIN TAG EXCISION;  Surgeon: [name], MD;  Location: [place];  Service: General;   Laterality: N/A;   LAPAROSCOPIC CHOLECYSTECTOMY  05/22/2001   SHOULDER SURGERY      Current Medications: Current Meds  Medication Sig   buPROPion (WELLBUTRIN XL) 300 MG 24 hr tablet Take 300 mg by mouth every morning.    butalbital-acetaminophen-caffeine (FIORICET) 50-325-40 MG tablet Take by mouth 2 (two) times daily as needed for headache.   carvedilol (COREG) 25 MG tablet Take 1.5 tablets (37.5 mg total) by mouth 2 (two) times daily.   dicyclomine (BENTYL) 10 MG capsule Take one tablet by mouth once to twice daily as needed   diltiazem (CARDIZEM CD) 180 MG 24 hr capsule Take 1 capsule (180 mg total) by mouth in the morning and at bedtime.   ELIQUIS 5 MG TABS tablet TAKE 1 TABLET BY MOUTH TWICE DAILY   escitalopram (LEXAPRO) 20 MG tablet Take 20 mg by mouth every morning.    estradiol (ESTRACE) 2 MG tablet Take 2 mg by mouth daily.   fenofibrate micronized (LOFIBRA) 200 MG capsule Take 200 mg by mouth daily before breakfast.    flecainide (TAMBOCOR) 50 MG tablet Take 1 tablet (50 mg total) by mouth 2 (two) times daily.   linaclotide (LINZESS) 72 MCG capsule Take one tablet by mouth every day for constipation prone IBS   loratadine (  ALAVERT) 10 MG dissolvable tablet Take 1 tablet by mouth daily.   losartan (COZAAR) 100 MG tablet Take 100 mg by mouth every morning.    meclizine (ANTIVERT) 25 MG tablet Take 1 tablet (25 mg total) by mouth 3 (three) times daily as needed for dizziness.   medroxyPROGESTERone (PROVERA) 2.5 MG tablet Take 2.5 mg by mouth daily.   meloxicam (MOBIC) 15 MG tablet Take 15 mg by mouth daily.   mometasone (ELOCON) 0.1 % ointment Apply topically 2 (two) times daily.   omega-3 acid ethyl esters (LOVAZA) 1 g capsule Take 1 capsule (1 g total) by mouth 2 (two) times daily.   omeprazole (PRILOSEC) 20 MG capsule Take 20 mg by mouth daily.   OZEMPIC, 1 MG/DOSE, 4 MG/3ML SOPN Inject into the skin.   progesterone (PROMETRIUM) 200 MG capsule Take 200 mg by mouth at bedtime.    PROGESTERONE PO Take 200 mg by mouth at bedtime.   triamcinolone (KENALOG) 0.1 % Apply topically 2 (two) times daily.   triamterene-hydrochlorothiazide (MAXZIDE-25) 37.5-25 MG per tablet Take 0.5 tablets by mouth every morning.    valACYclovir (VALTREX) 1000 MG tablet Take 1,000 mg by mouth 2 (two) times daily as needed.    valACYclovir (VALTREX) 500 MG tablet valacyclovir 500 mg tablet   zolpidem (AMBIEN) 10 MG tablet Take 10 mg by mouth at bedtime as needed for sleep.     Allergies:   Venlafaxine   Social History   Socioeconomic History   Marital status: Married    Spouse name: [name]   Number of children: 2   Years of education: RN   Highest education level: Not on file  Occupational History   Occupation: Therapist, sports  Tobacco Use   Smoking status: Never   Smokeless tobacco: Never  Vaping Use   Vaping Use: Never used  Substance and Sexual Activity   Alcohol use: No    Alcohol/week: 0.0 standard drinks   Drug use: No   Sexual activity: Not on file  Other Topics Concern   Not on file  Social History Narrative   Lives in [place].  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  Occasionally drinks coffee but not a heavy caffeine drinker.     Social Determinants of Health   Financial Resource Strain: Not on file  Food Insecurity: Not on file  Transportation Needs: Not on file  Physical Activity: Not on file  Stress: Not on file  Social Connections: Not on file     Family History: The patient's family history includes Atrial fibrillation in her mother; Breast cancer in an other family member; Colon cancer in her mother; Diabetes in her father, mother, sister, and sister; Hypertension in her father and mother; Skin cancer in her father; Suicidality in her father.  ROS:   Please see the history of present illness.    All other systems reviewed and are negative.  EKGs/Labs/Other Studies Reviewed:    The following studies were reviewed today:  August 30, 2020 Lexi  scan Blood pressure demonstrated a normal response to exercise. The patient exercised for 5 minutes and was not able to achieve 85% maximum predicted heart rate and thus switched to [place]. There was no ST segment deviation noted during stress. The study is normal. This is a low risk study. The left ventricular ejection fraction is normal (55-65%). CT attenuation images showed no evidence of aortic or coronary calcifications.    EKG:  The ekg ordered today demonstrates sinus rhythm.  PR interval  188 ms.  QRS duration 86 ms  Recent Labs: 01/25/2020: Magnesium 1.6 08/29/2020: BUN 24; Creatinine, Ser 0.87; Hemoglobin 11.8; Platelets 475; Potassium 3.7; Sodium 137  Recent Lipid Panel    Component Value Date/Time   CHOL 204 (H) 12/20/2019 0502   TRIG 510 (H) 12/20/2019 0502   HDL 39 (L) 12/20/2019 0502   CHOLHDL 5.2 12/20/2019 0502   VLDL UNABLE TO CALCULATE IF TRIGLYCERIDE OVER 400 mg/dL 12/20/2019 0502   LDLCALC UNABLE TO CALCULATE IF TRIGLYCERIDE OVER 400 mg/dL 12/20/2019 0502   LDLDIRECT 66.8 12/20/2019 0502    Physical Exam:    VS:  BP (!) 130/98 (BP Location: Left Arm, Patient Position: Sitting, Cuff Size: Normal)   Pulse 86   Ht '5\' 6"'$  (1.676 m)   Wt 230 lb (104.3 kg)   SpO2 97%   BMI 37.12 kg/m     Wt Readings from Last 3 Encounters:  12/25/20 230 lb (104.3 kg)  08/14/20 249 lb (112.9 kg)  07/29/20 248 lb (112.5 kg)     GEN:  Well nourished, well developed in no acute distress HEENT: Normal NECK: No JVD; No carotid bruits LYMPHATICS: No lymphadenopathy CARDIAC: RRR, no murmurs, rubs, gallops RESPIRATORY:  Clear to auscultation without rales, wheezing or rhonchi  ABDOMEN: Soft, non-tender, non-distended MUSCULOSKELETAL:  No edema; No deformity  SKIN: Warm and dry NEUROLOGIC:  Alert and oriented x 3 PSYCHIATRIC:  Normal affect   ASSESSMENT:    1. Paroxysmal atrial fibrillation (HCC)   2. Primary hypertension   3. Encounter for long-term (current) use of  high-risk medication    PLAN:    In order of problems listed above:    1. Paroxysmal atrial fibrillation (HCC) Maintaining sinus rhythm on flecainide 50 mg by mouth twice daily and Coreg 37.5 mg by mouth twice daily.  On Eliquis for stroke prophylaxis.  For now, continue this regimen.  The patient has a D&C scheduled for vaginal bleeding.  It would be fine for her to stop her blood thinner for 2 to 3 days prior to the procedure and restart it when felt safe from a surgical perspective.  If bleeding becomes a recurrent issue, could consider left atrial appendage occlusion as a strategy for stroke prevention in an effort to avoid long-term exposure to anticoagulation.  2. Primary hypertension Controlled.  Continue Coreg, diltiazem, losartan, hydrochlorothiazide  3. Encounter for long-term (current) use of high-risk medication PR and QRS duration are stable.  Continue flecainide at current dose.  Follow-up with PA/NP in 6 months. Follow-up with me in 1 year.      Medication Adjustments/Labs and Tests Ordered: Current medicines are reviewed at length with the patient today.  Concerns regarding medicines are outlined above.  No orders of the defined types were placed in this encounter.  No orders of the defined types were placed in this encounter.    Signed, Lars Mage, MD, [facility], [facility] 12/25/2020 1:16 PM    Electrophysiology [facility]

## 2021-01-06 ENCOUNTER — Telehealth: Payer: Self-pay | Admitting: Cardiology

## 2021-01-06 NOTE — Telephone Encounter (Signed)
    Patient Name: [name] Ross  DOB: 06-05-1965 MRN: [id number]  Primary Cardiologist: Kate Sable, MD  Chart reviewed as part of pre-operative protocol coverage. Patient was recently seen by Dr. Lurline Hare 12/25/20 and was doing well from a cardiac standpoint without any anginal complaints. As such, [name] would be at acceptable risk for the planned procedure without further cardiovascular testing.   Per Dr. Quentin Ore, patient can hold eliquis 2-3 days prior to her upcoming D&C with plans to re-start the following day if cleared to do so by her surgeon.   I will route this recommendation to the requesting party via Epic fax function and remove from pre-op pool.  Please call with questions.  Abigail Butts, PA-C 01/06/2021, 1:20 PM

## 2021-01-06 NOTE — Telephone Encounter (Signed)
   [facility] Pre-operative Risk Assessment    Patient Name: [name] Ross  DOB: 1965-07-20 MRN: [id number]  HEARTCARE STAFF:  - IMPORTANT!!!!!! Under Visit Info/Reason for Call, type in Other and utilize the format Clearance MM/DD/YY or Clearance TBD. Do not use dashes or single digits. - Please review there is not already an duplicate clearance open for this procedure. - If request is for dental extraction, please clarify the # of teeth to be extracted. - If the patient is currently at the dentist's office, call Pre-Op Callback Staff (MA/nurse) to input urgent request.  - If the patient is not currently in the dentist office, please route to the Pre-Op pool.  Request for surgical clearance:  What type of surgery is being performed? HYSTEROSCOPY D&C, POSSIBLE MYOSURE  When is this surgery scheduled? 01/21/21  What type of clearance is required (medical clearance vs. Pharmacy clearance to hold med vs. Both)? BOTH  Are there any medications that need to be held prior to surgery and how long? ELIQUIS STOP AND START INSTRUCTIONS  Practice name and name of physician performing surgery? [facility]   What is the office phone number? [phone]   7.   What is the office fax number? [phone]  8.   Anesthesia type (None, local, MAC, general) ? PROPOFOL   Eli Phillips 01/06/2021, 12:30 PM  _________________________________________________________________   (provider comments below)

## 2021-01-21 ENCOUNTER — Other Ambulatory Visit: Payer: Self-pay | Admitting: Cardiology

## 2021-01-21 NOTE — Telephone Encounter (Signed)
This is a [facility] pt 

## 2021-03-19 ENCOUNTER — Other Ambulatory Visit: Payer: Self-pay | Admitting: Cardiology

## 2021-03-21 ENCOUNTER — Telehealth: Payer: Self-pay

## 2021-03-21 ENCOUNTER — Encounter: Payer: Self-pay | Admitting: Gastroenterology

## 2021-03-21 ENCOUNTER — Ambulatory Visit (INDEPENDENT_AMBULATORY_CARE_PROVIDER_SITE_OTHER): Payer: 59 | Admitting: Gastroenterology

## 2021-03-21 VITALS — BP 124/72 | HR 80 | Ht 66.5 in | Wt 225.2 lb

## 2021-03-21 DIAGNOSIS — R0989 Other specified symptoms and signs involving the circulatory and respiratory systems: Secondary | ICD-10-CM

## 2021-03-21 DIAGNOSIS — Z7901 Long term (current) use of anticoagulants: Secondary | ICD-10-CM

## 2021-03-21 DIAGNOSIS — R09A2 Foreign body sensation, throat: Secondary | ICD-10-CM

## 2021-03-21 DIAGNOSIS — K219 Gastro-esophageal reflux disease without esophagitis: Secondary | ICD-10-CM | POA: Diagnosis not present

## 2021-03-21 DIAGNOSIS — Z1211 Encounter for screening for malignant neoplasm of colon: Secondary | ICD-10-CM | POA: Diagnosis not present

## 2021-03-21 DIAGNOSIS — K5909 Other constipation: Secondary | ICD-10-CM

## 2021-03-21 MED ORDER — SUTAB 1479-225-188 MG PO TABS: 1.0000 | ORAL_TABLET | Freq: Once | ORAL | 0 refills | Status: AC

## 2021-03-21 MED ORDER — OMEPRAZOLE 20 MG PO CPDR
20.0000 mg | DELAYED_RELEASE_CAPSULE | Freq: Every day | ORAL | 1 refills | Status: DC
Start: 2021-03-21 — End: 2021-05-29

## 2021-03-21 NOTE — Patient Instructions (Addendum)
If you are age 55 or older, your body mass index should be between 23-30. Your Body mass index is 35.8 kg/m. If this is out of the aforementioned range listed, please consider follow up with your Primary Care Provider.  If you are age 7 or younger, your body mass index should be between 19-25. Your Body mass index is 35.8 kg/m. If this is out of the aformentioned range listed, please consider follow up with your Primary Care Provider.   ________________________________________________________  The [facility] providers would like to encourage you to use [facility] to communicate with providers for non-urgent requests or questions.  Due to long hold times on the telephone, sending your provider a message by [facility] may be a faster and more efficient way to get a response.  Please allow 48 business hours for a response.  Please remember that this is for non-urgent requests.  _______________________________________________________  [name] Ross have been scheduled for an endoscopy and colonoscopy. Please follow the written instructions given to you at your visit today. Please pick up your prep supplies at the pharmacy within the next 1-3 days. If you use inhalers (even only as needed), please bring them with you on the day of your procedure.  You will be contacted by our office prior to your procedure for directions on holding your ELIQUIS.  If you do not hear from our office 1 week prior to your scheduled procedure, please call [phone] to discuss.  We have sent the following medications to your pharmacy for you to pick up at your convenience: Omeprazole    Thank you for entrusting me with your care and for choosing [facility], Dr. Meridian Cellar

## 2021-03-21 NOTE — Telephone Encounter (Signed)
[facility] Pre-operative Risk Assessment     Request for surgical clearance:     Endoscopy Procedure  What type of surgery is being performed?     EGD/COLONOSCOPY  When is this surgery scheduled?     04-25-2021  What type of clearance is required ?   Pharmacy  Are there any medications that need to be held prior to surgery and how long? ELIQUIS 2 DAYS  Practice name and name of physician performing surgery?  [facility]  What is your office phone and fax number?      Phone- [phone]  Fax- [phone] [place]  Anesthesia type (None, local, MAC, general) ?     MAC   THANK YOU

## 2021-03-21 NOTE — Progress Notes (Signed)
HPI :  55 year old female here for a follow-up visit for IBS-C/chronic constipation, GERD, globus.  I last saw her in February 2021.  She has had some longstanding constipation.  She has been started on Ozempic since I last saw her this past March/April, she thinks perhaps making her bowel function worse.  She has been taking MiraLAX as needed which does help, sometimes can make the stool looser than she wants and can have some leakage.  She uses Linzess as needed if she really needs help with bowel movement, but does not often need to use that.  No blood in her stools.  Her last colonoscopy was in January 2013 which showed no polyps.  No concerning pathology.  She has no family history of colon cancer.  She also has some longstanding reflux that has bothered her for years.  Previously was on Zantac for a long time and then transitioned over to PPI.  Previously on Protonix and then transition to omeprazole, now on 20 mg/day.  She states that seems to generally control her heartburn which is her main reflux symptom, in general she has been happy with the regimen for a while.  However more recently, over several months she has had sense of globus.  States she feels a lump in her throat especially with swallowing, she feels it frequently.  Tends to feel worse after she eats.  No dysphagia otherwise with solids.  Whenever she eats hot soup or cereal, sometimes she states it "goes down the wrong pipe, he can go into her lungs.  This is not a frequent occurrence.  She denies any family history of Barrett's esophagus.  She is never had a prior EGD.  She denies any kidney disease or history of osteoporosis.  We discussed long-term PPI use.  She has been diagnosed with atrial fibrillation since of last year.  Currently follow-up with cardiology, she is been on flecainide since March and states this controls her pretty well.  She is trying to avoid ablation therapy if possible.  She is also taking Eliquis.   She  also has had prior occurrence of diverticulitis 4 times in the past. She has been offered surgery but declined at this time. No FH of CRC.    Colonoscopy 05/2011 - diverticulosis, otherwise normal. No adenomas.     Echocardiogram 12/20/19 - EF 60-65%, otherwise normal  Nuclear stress test 08/30/20 - low risk study, normal EF   Past Medical History:  Diagnosis Date   Anal skin tag    Atrial fibrillation (HCC)    Depression    Diverticulosis    Diverticulosis, sigmoid    Dyslipidemia    GERD (gastroesophageal reflux disease)    Hemorrhoids    internal and external   History of diverticulitis    10/ 2016   History of galactorrhea    History of stress test    a. Pt reports h/o stress test and echo by cardiologist in [place] (no records in [place]).  Both reportedly nl.   Hypertension    IBS (irritable bowel syndrome)    Morbid obesity (HCC)    PONV (postoperative nausea and vomiting)    severe   Rash    elbows and behind knees   Restless leg syndrome    Sleep apnea    cpap sometimes   Wears glasses      Past Surgical History:  Procedure Laterality Date   BAND HEMORRHOIDECTOMY  2012   Dr Earlean Shawl   CESAREAN SECTION  [phone]  COLONOSCOPY  06/18/2011   CYSTO/  BILATERAL PYELOGRAM RETROGRADES/  TRANSOBTURATOR SLING  03/08/2006   EXCISION OF SKIN TAG N/A 02/13/2016   Procedure: ANAL SKIN TAG EXCISION;  Surgeon: [name], MD;  Location: [place];  Service: General;  Laterality: N/A;   LAPAROSCOPIC CHOLECYSTECTOMY  05/22/2001   SHOULDER SURGERY     Family History  Problem Relation Age of Onset   Diabetes Mother    Hypertension Mother    Atrial fibrillation Mother    Colon cancer Mother    Diabetes Father        committed suicide   Skin cancer Father    Suicidality Father    Hypertension Father    Diabetes Sister    Diabetes Sister    Breast cancer Other        materanl great aunt   Social History   Tobacco Use   Smoking status: Never   Smokeless  tobacco: Never  Vaping Use   Vaping Use: Never used  Substance Use Topics   Alcohol use: No    Alcohol/week: 0.0 standard drinks   Drug use: No   Current Outpatient Medications  Medication Sig Dispense Refill   buPROPion (WELLBUTRIN XL) 300 MG 24 hr tablet Take 300 mg by mouth every morning.      butalbital-acetaminophen-caffeine (FIORICET) 50-325-40 MG tablet Take by mouth 2 (two) times daily as needed for headache.     carvedilol (COREG) 25 MG tablet Take 1.5 tablets (37.5 mg total) by mouth 2 (two) times daily. 90 tablet 3   dicyclomine (BENTYL) 10 MG capsule Take one tablet by mouth once to twice daily as needed 90 capsule 3   diltiazem (CARDIZEM CD) 180 MG 24 hr capsule Take 1 capsule (180 mg total) by mouth in the morning and at bedtime. 180 capsule 1   ELIQUIS 5 MG TABS tablet TAKE 1 TABLET BY MOUTH TWICE DAILY 60 tablet 9   escitalopram (LEXAPRO) 20 MG tablet Take 20 mg by mouth every morning.      estradiol (ESTRACE) 2 MG tablet Take 2 mg by mouth daily.     fenofibrate micronized (LOFIBRA) 200 MG capsule Take 200 mg by mouth daily before breakfast.   4   flecainide (TAMBOCOR) 50 MG tablet TAKE 1 TABLET BY MOUTH TWICE DAILY 180 tablet 2   loratadine (ALAVERT) 10 MG dissolvable tablet Take 1 tablet by mouth daily.     losartan (COZAAR) 100 MG tablet Take 100 mg by mouth every morning.   3   meclizine (ANTIVERT) 25 MG tablet Take 1 tablet (25 mg total) by mouth 3 (three) times daily as needed for dizziness. 30 tablet 0   meloxicam (MOBIC) 15 MG tablet Take 15 mg by mouth daily.     mometasone (ELOCON) 0.1 % ointment Apply topically 2 (two) times daily.     omega-3 acid ethyl esters (LOVAZA) 1 g capsule Take 1 capsule (1 g total) by mouth 2 (two) times daily. 60 capsule 5   OZEMPIC, 2 MG/DOSE, 8 MG/3ML SOPN Inject into the skin.     progesterone (PROMETRIUM) 200 MG capsule Take 200 mg by mouth at bedtime.     PROGESTERONE PO Take 200 mg by mouth at bedtime.     Sodium Sulfate-Mag  Sulfate-KCl (SUTAB) [phone] MG TABS Take 1 kit by mouth once for 1 dose. 24 tablet 0   triamcinolone (KENALOG) 0.1 % Apply topically 2 (two) times daily.     triamterene-hydrochlorothiazide (MAXZIDE-25) 37.5-25 MG per tablet  Take 0.5 tablets by mouth every morning.      valACYclovir (VALTREX) 1000 MG tablet Take 1,000 mg by mouth 2 (two) times daily as needed.      valACYclovir (VALTREX) 500 MG tablet valacyclovir 500 mg tablet     zolpidem (AMBIEN) 10 MG tablet Take 10 mg by mouth at bedtime as needed for sleep.     linaclotide (LINZESS) 72 MCG capsule Take one tablet by mouth every day for constipation prone IBS (Patient not taking: Reported on 03/21/2021)     omeprazole (PRILOSEC) 20 MG capsule Take 1 capsule (20 mg total) by mouth daily. 90 capsule 1   No current facility-administered medications for this visit.   Allergies  Allergen Reactions   Venlafaxine     Other reaction(s): sleepy     Review of Systems: All systems reviewed and negative except where noted in HPI.    Lab Results  Component Value Date   WBC 7.3 08/29/2020   HGB 11.8 (L) 08/29/2020   HCT 34.4 (L) 08/29/2020   MCV 89.4 08/29/2020   PLT 475 (H) 08/29/2020    Lab Results  Component Value Date   CREATININE 0.87 08/29/2020   BUN 24 (H) 08/29/2020   NA 137 08/29/2020   K 3.7 08/29/2020   CL 104 08/29/2020   CO2 22 08/29/2020    Lab Results  Component Value Date   ALT 19 03/07/2015   AST 16 03/07/2015   ALKPHOS 35 (L) 03/07/2015   BILITOT 0.4 03/07/2015     Physical Exam: BP 124/72   Pulse 80   Ht 5' 6.5" (1.689 m)   Wt 225 lb 3.2 oz (102.2 kg)   BMI 35.80 kg/m  Constitutional: Pleasant,well-developed, female in no acute distress. Neurological: Alert and oriented to person place and time. Psychiatric: Normal mood and affect. Behavior is normal.   ASSESSMENT AND PLAN: 55 y/o female here for reassessment of the following issues:  Globus GERD Chronic constipation Colon cancer  screening Anticoagulated  Patient here for reassessment of issues as above. GERD historically has been pretty well controlled on low-dose PPI but now having globus that is bothering her despite but seems to be fair control of her reflux.  She also has some questionable oropharyngeal dysphagia with liquids.  We discussed her PPI use.  I discussed long-term use of chronic PPIs and associated risks versus benefits.  At this time it is unclear what is causing her globus, could be due to reflux.  She is never had a prior EGD, in light of her age, ethnicity, weight, length of reflux, and now globus, EGD is reasonable to further evaluate.  I discussed risk benefits of endoscopy and anesthesia and she wants to proceed.  At the same time she is due for her colon cancer screening given its been 10 years since her last exam.  She wishes to do colonoscopy at the same time after discussion of risks and benefits.  She will need approval to hold Eliquis for 2 days prior to the exam.  In the interim recommend she use MiraLAX a bit more frequently can titrate up or down as needed for her constipation.  We will await EGD otherwise in regards to her oropharyngeal symptoms, if EGD is negative may consider a modified barium swallow if symptoms persist.  Plan: - EGD and colonoscopy - ask for approval to hold eliquis for 2 days prior to the exam - use Miralax daily and titrate up or down as needed - refill omeprazole -  once daily - will try BID for a week or 2 to see if it helps globus. Discussed risks / benefits of long term PPI use - will await EGD, if oropharyngeal symptoms persist consider modified barium swallow  Jolly Mango, MD [facility]

## 2021-03-24 NOTE — Telephone Encounter (Signed)
Called and LM for patient to call back to discuss holding Eliquis on November 30th and December 1st, prior to procedure on 12-2. Asked for patient to call back to verbalize understanding and confirmation regarding days

## 2021-03-24 NOTE — Telephone Encounter (Signed)
Patient with diagnosis of afib on Eliquis for anticoagulation.    Procedure: EGD/colonoscopy Date of procedure: 04/25/21  CHA2DS2-VASc Score = 2  This indicates a 2.2% annual risk of stroke. The patient's score is based upon: CHF History: 0 HTN History: 1 Diabetes History: 0 Stroke History: 0 Vascular Disease History: 0 Age Score: 0 Gender Score: 1   CrCl 65mL/min using adjusted body weight Platelet count 475K  Per office protocol, patient can hold Eliquis for 2 days prior to procedure.

## 2021-03-24 NOTE — Telephone Encounter (Signed)
   GI team has already been in contact with Ms. Kellogg.  I will remove from the preop pool at this time.  Please call with questions.  Abigail Butts, PA-C 03/24/21; 12:55 PM

## 2021-03-24 NOTE — Telephone Encounter (Signed)
Patient returned call. Advised to hold eliquis 11/30 and 12/1. Patient expressed understanding

## 2021-04-05 ENCOUNTER — Inpatient Hospital Stay
Admission: EM | Admit: 2021-04-05 | Discharge: 2021-04-08 | DRG: 310 | Disposition: A | Discharge status: Home / Self Care | Payer: 59 | Attending: Internal Medicine | Admitting: Internal Medicine

## 2021-04-05 ENCOUNTER — Emergency Department: Payer: 59

## 2021-04-05 ENCOUNTER — Encounter: Payer: Self-pay | Admitting: Emergency Medicine

## 2021-04-05 ENCOUNTER — Telehealth: Payer: Self-pay | Admitting: Physician Assistant

## 2021-04-05 ENCOUNTER — Encounter: Payer: 59 | Admitting: Nurse Practitioner

## 2021-04-05 DIAGNOSIS — I480 Paroxysmal atrial fibrillation: Secondary | ICD-10-CM | POA: Diagnosis not present

## 2021-04-05 DIAGNOSIS — G8929 Other chronic pain: Secondary | ICD-10-CM | POA: Diagnosis present

## 2021-04-05 DIAGNOSIS — G4733 Obstructive sleep apnea (adult) (pediatric): Secondary | ICD-10-CM | POA: Diagnosis present

## 2021-04-05 DIAGNOSIS — J019 Acute sinusitis, unspecified: Secondary | ICD-10-CM | POA: Diagnosis present

## 2021-04-05 DIAGNOSIS — Z6835 Body mass index (BMI) 35.0-35.9, adult: Secondary | ICD-10-CM

## 2021-04-05 DIAGNOSIS — Z8249 Family history of ischemic heart disease and other diseases of the circulatory system: Secondary | ICD-10-CM

## 2021-04-05 DIAGNOSIS — I4891 Unspecified atrial fibrillation: Secondary | ICD-10-CM | POA: Diagnosis not present

## 2021-04-05 DIAGNOSIS — Z79899 Other long term (current) drug therapy: Secondary | ICD-10-CM

## 2021-04-05 DIAGNOSIS — Z7901 Long term (current) use of anticoagulants: Secondary | ICD-10-CM

## 2021-04-05 DIAGNOSIS — Z888 Allergy status to other drugs, medicaments and biological substances status: Secondary | ICD-10-CM

## 2021-04-05 DIAGNOSIS — Z791 Long term (current) use of non-steroidal anti-inflammatories (NSAID): Secondary | ICD-10-CM

## 2021-04-05 DIAGNOSIS — E785 Hyperlipidemia, unspecified: Secondary | ICD-10-CM | POA: Diagnosis present

## 2021-04-05 DIAGNOSIS — J069 Acute upper respiratory infection, unspecified: Secondary | ICD-10-CM | POA: Diagnosis present

## 2021-04-05 DIAGNOSIS — I4892 Unspecified atrial flutter: Secondary | ICD-10-CM | POA: Diagnosis present

## 2021-04-05 DIAGNOSIS — R0609 Other forms of dyspnea: Secondary | ICD-10-CM

## 2021-04-05 DIAGNOSIS — I10 Essential (primary) hypertension: Secondary | ICD-10-CM | POA: Diagnosis present

## 2021-04-05 DIAGNOSIS — M549 Dorsalgia, unspecified: Secondary | ICD-10-CM | POA: Diagnosis present

## 2021-04-05 DIAGNOSIS — R002 Palpitations: Secondary | ICD-10-CM

## 2021-04-05 DIAGNOSIS — G2581 Restless legs syndrome: Secondary | ICD-10-CM | POA: Diagnosis present

## 2021-04-05 DIAGNOSIS — D649 Anemia, unspecified: Secondary | ICD-10-CM | POA: Diagnosis present

## 2021-04-05 DIAGNOSIS — E876 Hypokalemia: Secondary | ICD-10-CM | POA: Diagnosis present

## 2021-04-05 DIAGNOSIS — Z833 Family history of diabetes mellitus: Secondary | ICD-10-CM

## 2021-04-05 DIAGNOSIS — Z20822 Contact with and (suspected) exposure to covid-19: Secondary | ICD-10-CM | POA: Diagnosis present

## 2021-04-05 LAB — COMPREHENSIVE METABOLIC PANEL
ALT: 14 U/L (ref 0–44)
AST: 22 U/L (ref 15–41)
Albumin: 3.5 g/dL (ref 3.5–5.0)
Alkaline Phosphatase: 32 U/L — ABNORMAL LOW (ref 38–126)
Anion gap: 9 (ref 5–15)
BUN: 14 mg/dL (ref 6–20)
CO2: 21 mmol/L — ABNORMAL LOW (ref 22–32)
Calcium: 9 mg/dL (ref 8.9–10.3)
Chloride: 105 mmol/L (ref 98–111)
Creatinine, Ser: 0.87 mg/dL (ref 0.44–1.00)
GFR, Estimated: >60 mL/min (ref >60)
Glucose, Bld: 112 mg/dL — ABNORMAL HIGH (ref 70–99)
Potassium: 4 mmol/L (ref 3.5–5.1)
Sodium: 135 mmol/L (ref 135–145)
Total Bilirubin: 0.7 mg/dL (ref 0.3–1.2)
Total Protein: 7.7 g/dL (ref 6.5–8.1)

## 2021-04-05 LAB — URINE DRUG SCREEN, QUALITATIVE (ARMC ONLY)
Amphetamines, Ur Screen: NOT DETECTED
Barbiturates, Ur Screen: NOT DETECTED
Benzodiazepine, Ur Scrn: NOT DETECTED
Cannabinoid 50 Ng, Ur ~~LOC~~: NOT DETECTED
Cocaine Metabolite,Ur ~~LOC~~: NOT DETECTED
MDMA (Ecstasy)Ur Screen: POSITIVE — AB
Methadone Scn, Ur: NOT DETECTED
Opiate, Ur Screen: NOT DETECTED
Phencyclidine (PCP) Ur S: NOT DETECTED
Tricyclic, Ur Screen: NOT DETECTED

## 2021-04-05 LAB — RESP PANEL BY RT-PCR (FLU A&B, COVID) ARPGX2
Influenza A by PCR: NEGATIVE
Influenza B by PCR: NEGATIVE
SARS Coronavirus 2 by RT PCR: NEGATIVE

## 2021-04-05 LAB — CBC WITH DIFFERENTIAL/PLATELET
Abs Immature Granulocytes: 0.18 10*3/uL — ABNORMAL HIGH (ref 0.00–0.07)
Basophils Absolute: 0 10*3/uL (ref 0.0–0.1)
Basophils Relative: 0 %
Eosinophils Absolute: 0.2 10*3/uL (ref 0.0–0.5)
Eosinophils Relative: 2 %
HCT: 36.1 % (ref 36.0–46.0)
Hemoglobin: 12.3 g/dL (ref 12.0–15.0)
Immature Granulocytes: 2 %
Lymphocytes Relative: 21 %
Lymphs Abs: 2.3 10*3/uL (ref 0.7–4.0)
MCH: 29.6 pg (ref 26.0–34.0)
MCHC: 34.1 g/dL (ref 30.0–36.0)
MCV: 87 fL (ref 80.0–100.0)
Monocytes Absolute: 0.7 10*3/uL (ref 0.1–1.0)
Monocytes Relative: 6 %
Neutro Abs: 7.7 10*3/uL (ref 1.7–7.7)
Neutrophils Relative %: 69 %
Platelets: 597 10*3/uL — ABNORMAL HIGH (ref 150–400)
RBC: 4.15 MIL/uL (ref 3.87–5.11)
RDW: 12.6 % (ref 11.5–15.5)
WBC: 11.2 10*3/uL — ABNORMAL HIGH (ref 4.0–10.5)
nRBC: 0 % (ref 0.0–0.2)

## 2021-04-05 LAB — BRAIN NATRIURETIC PEPTIDE: B Natriuretic Peptide: 92.2 pg/mL (ref 0.0–100.0)

## 2021-04-05 LAB — TROPONIN I (HIGH SENSITIVITY)
Troponin I (High Sensitivity): 4 ng/L (ref <18)
Troponin I (High Sensitivity): 3 ng/L (ref <18)

## 2021-04-05 LAB — MAGNESIUM: Magnesium: 1.6 mg/dL — ABNORMAL LOW (ref 1.7–2.4)

## 2021-04-05 MED ORDER — MAGNESIUM SULFATE 2 GM/50ML IV SOLN
2.0000 g | Freq: Once | INTRAVENOUS | Status: AC
  Administered 2021-04-06: 2 g via INTRAVENOUS
  Filled 2021-04-05: qty 50

## 2021-04-05 MED ORDER — DILTIAZEM HCL-DEXTROSE 125-5 MG/125ML-% IV SOLN (PREMIX)
5.0000 mg/h | INTRAVENOUS | Status: DC
  Administered 2021-04-05: 5 mg/h via INTRAVENOUS
  Administered 2021-04-06: 10 mg/h via INTRAVENOUS
  Administered 2021-04-06 – 2021-04-08 (×2): 15 mg/h via INTRAVENOUS
  Filled 2021-04-05 (×6): qty 125

## 2021-04-05 MED ORDER — AMPICILLIN-SULBACTAM SODIUM 3 (2-1) G IJ SOLR
3.0000 g | Freq: Three times a day (TID) | INTRAMUSCULAR | Status: DC
  Administered 2021-04-06: 3 g via INTRAVENOUS
  Filled 2021-04-05: qty 8

## 2021-04-05 MED ORDER — MAGNESIUM SULFATE IN D5W 1-5 GM/100ML-% IV SOLN
1.0000 g | Freq: Once | INTRAVENOUS | Status: DC
  Filled 2021-04-05: qty 100

## 2021-04-05 MED ORDER — MAGNESIUM SULFATE 50 % IJ SOLN: 2.0000 g | Freq: Once | INTRAMUSCULAR | Status: DC

## 2021-04-05 NOTE — Telephone Encounter (Signed)
55 y.o. female with a hx of parox atrial fibrillation.   She was dx with the flu a few days ago and started on Tamiflu. She developed recurrent atrial fibrillation today.  HR is now ~110. She has not been short of breath or had chest pain.  She feels weak/lightheaded.  Her BP has been low since she was dx with the flu.  She has not had syncope or syncope.   She took zofran earlier today for some nausea.  With her lower BP, I am hesitant to have her take extra carvedilol or diltiazem.  I am also not comfortable having her take extra Flecainide with having taken the zofran earlier.  She has not missed a dose of Apixaban.  PLAN: I have asked her to hydrate well and rest today.  I asked her to drink some chicken noodle soup broth as well.  If she has any change in her symptoms and develops chest pain, shortness of breath, she should go to the ED. If her HR increases > 120, she should go to the ED. I asked her to call me back tomorrow to let me know how she is doing. I will send a message to the office in [place] to get her in for f/u on Monday (if she remains in AFib). She agreed with this plan. Richardson Dopp, PA-C    04/05/2021 2:37 PM

## 2021-04-05 NOTE — ED Triage Notes (Signed)
Pt states she could tell her HR was elevated today and has hx of Afib. Started on tamiful for possible flu but tested negative.

## 2021-04-05 NOTE — Progress Notes (Signed)
Virtual Visit Consent   [name] Ross, you are scheduled for a virtual visit with a [facility] provider today.     Just as with appointments in the office, your consent must be obtained to participate.  Your consent will be active for this visit and any virtual visit you may have with one of our providers in the next 365 days.     If you have a MyChart account, a copy of this consent can be sent to you electronically.  All virtual visits are billed to your insurance company just like a traditional visit in the office.    As this is a virtual visit, video technology does not allow for your provider to perform a traditional examination.  This may limit your provider's ability to fully assess your condition.  If your provider identifies any concerns that need to be evaluated in person or the need to arrange testing (such as labs, EKG, etc.), we will make arrangements to do so.     Although advances in technology are sophisticated, we cannot ensure that it will always work on either your end or our end.  If the connection with a video visit is poor, the visit may have to be switched to a telephone visit.  With either a video or telephone visit, we are not always able to ensure that we have a secure connection.     I need to obtain your verbal consent now.   Are you willing to proceed with your visit today?    [name] Ross has provided verbal consent on 04/05/2021 for a virtual visit (video or telephone).   [name] Pounds, NP   Date: 04/05/2021 1:58 PM   Virtual Visit via Video Note   I, [name] Ross, connected with  [name] Ross  ([id number], 1966/03/11) on 04/05/21 at  2:00 PM EST by a video-enabled telemedicine application and verified that I am speaking with the correct person using two identifiers.  Location: Patient: Virtual Visit Location Patient: Home Provider: Virtual Visit Location Provider: Home Office   I discussed the limitations of evaluation and management by telemedicine  and the availability of in person appointments. The patient expressed understanding and agreed to proceed.    History of Present Illness: [name] Ross is a 55 y.o. who identifies as a female who was assigned female at birth, and is being seen today for afib.  HPI:  States she was diagnosed with the Flu on Wednesday and now with aflb HR 110-120s via her home monitor. BP124/85. She has a history of PAF with last office notes from Cardiology 12-2020 noting NSR with flecainide, diltiazem and carvedilol. HR at that time was in the 80s. She is also taking Eliquis. Denies chest pain but does appear to have shortness of breath via video visit today.    Problems:  Patient Active Problem List   Diagnosis Date Noted   Paroxysmal atrial fibrillation ([place]) 01/04/2020   Essential hypertension    Atrial fibrillation with rapid ventricular response ([place]) 12/19/2019   Constipation 06/15/2011   Internal and external hemorrhoids without complication 02/58/5277   OSA (obstructive sleep apnea) 06/15/2011    Allergies:  Allergies  Allergen Reactions   Venlafaxine     Other reaction(s): sleepy   Medications:  Current Outpatient Medications:    buPROPion (WELLBUTRIN XL) 300 MG 24 hr tablet, Take 300 mg by mouth every morning. , Disp: , Rfl:    butalbital-acetaminophen-caffeine (FIORICET) 50-325-40 MG tablet, Take by mouth 2 (  two) times daily as needed for headache., Disp: , Rfl:    carvedilol (COREG) 25 MG tablet, Take 1.5 tablets (37.5 mg total) by mouth 2 (two) times daily., Disp: 90 tablet, Rfl: 3   dicyclomine (BENTYL) 10 MG capsule, Take one tablet by mouth once to twice daily as needed, Disp: 90 capsule, Rfl: 3   diltiazem (CARDIZEM CD) 180 MG 24 hr capsule, Take 1 capsule (180 mg total) by mouth in the morning and at bedtime., Disp: 180 capsule, Rfl: 1   ELIQUIS 5 MG TABS tablet, TAKE 1 TABLET BY MOUTH TWICE DAILY, Disp: 60 tablet, Rfl: 9   escitalopram (LEXAPRO) 20 MG tablet, Take 20 mg by mouth  every morning. , Disp: , Rfl:    estradiol (ESTRACE) 2 MG tablet, Take 2 mg by mouth daily., Disp: , Rfl:    fenofibrate micronized (LOFIBRA) 200 MG capsule, Take 200 mg by mouth daily before breakfast. , Disp: , Rfl: 4   flecainide (TAMBOCOR) 50 MG tablet, TAKE 1 TABLET BY MOUTH TWICE DAILY, Disp: 180 tablet, Rfl: 2   linaclotide (LINZESS) 72 MCG capsule, Take one tablet by mouth every day for constipation prone IBS (Patient not taking: Reported on 03/21/2021), Disp: , Rfl:    loratadine (ALAVERT) 10 MG dissolvable tablet, Take 1 tablet by mouth daily., Disp: , Rfl:    losartan (COZAAR) 100 MG tablet, Take 100 mg by mouth every morning. , Disp: , Rfl: 3   meclizine (ANTIVERT) 25 MG tablet, Take 1 tablet (25 mg total) by mouth 3 (three) times daily as needed for dizziness., Disp: 30 tablet, Rfl: 0   meloxicam (MOBIC) 15 MG tablet, Take 15 mg by mouth daily., Disp: , Rfl:    mometasone (ELOCON) 0.1 % ointment, Apply topically 2 (two) times daily., Disp: , Rfl:    omega-3 acid ethyl esters (LOVAZA) 1 g capsule, Take 1 capsule (1 g total) by mouth 2 (two) times daily., Disp: 60 capsule, Rfl: 5   omeprazole (PRILOSEC) 20 MG capsule, Take 1 capsule (20 mg total) by mouth daily., Disp: 90 capsule, Rfl: 1   OZEMPIC, 2 MG/DOSE, 8 MG/3ML SOPN, Inject into the skin., Disp: , Rfl:    progesterone (PROMETRIUM) 200 MG capsule, Take 200 mg by mouth at bedtime., Disp: , Rfl:    PROGESTERONE PO, Take 200 mg by mouth at bedtime., Disp: , Rfl:    triamcinolone (KENALOG) 0.1 %, Apply topically 2 (two) times daily., Disp: , Rfl:    triamterene-hydrochlorothiazide (MAXZIDE-25) 37.5-25 MG per tablet, Take 0.5 tablets by mouth every morning. , Disp: , Rfl:    valACYclovir (VALTREX) 1000 MG tablet, Take 1,000 mg by mouth 2 (two) times daily as needed. , Disp: , Rfl:    valACYclovir (VALTREX) 500 MG tablet, valacyclovir 500 mg tablet, Disp: , Rfl:    zolpidem (AMBIEN) 10 MG tablet, Take 10 mg by mouth at bedtime as needed  for sleep., Disp: , Rfl:   Observations/Objective: Patient is well-developed, well-nourished in no acute distress.  Resting comfortably  at home.  Head is normocephalic, atraumatic.  No labored breathing.  Speech is clear and coherent with logical content.  Patient is alert and oriented at baseline.    Assessment and Plan: 1. Paroxysmal atrial fibrillation [facility]) Patient has been instructed to contact cardiology after hours for further instructions. If unable to reach needs to be evaluated in Emergency room  Follow Up Instructions: I discussed the assessment and treatment plan with the patient. The patient was provided an opportunity  to ask questions and all were answered. The patient agreed with the plan and demonstrated an understanding of the instructions.  A copy of instructions were sent to the patient via MyChart unless otherwise noted below.    The patient was advised to call back or seek an in-person evaluation if the symptoms worsen or if the condition fails to improve as anticipated.  Time:  I spent 10 minutes with the patient via telehealth technology discussing the above problems/concerns.    [name] Pounds, NP

## 2021-04-05 NOTE — ED Provider Notes (Signed)
[facility] Emergency Department Provider Note   ____________________________________________   Event Date/Time   First MD Initiated Contact with Patient 04/05/21 1902     (approximate)  I have reviewed the triage vital signs and the nursing notes.   HISTORY  Chief Complaint Atrial Fibrillation    HPI [name] Ross is a 55 y.o. female who presents for palpitations  LOCATION: Chest DURATION: Began this morning TIMING: Stable since onset SEVERITY: Mild QUALITY: Palpitations CONTEXT: Patient states she has a history of paroxysmal atrial fibrillation and takes Eliquis as well as sees Dr. Quentin Ore in electrophysiology.  Patient states that she has had worsening upper respiratory symptoms over the past 7 days.  Patient states that she had a fit of coughing this morning and afterwards had continuous palpitations and found her heart rate to be in the 140s MODIFYING FACTORS: Denies any exacerbating or relieving factors ASSOCIATED SYMPTOMS: Cough, sore throat, headache, fatigue   Per medical record review, patient has history of paroxysmal atrial fibrillation on Eliquis          Past Medical History:  Diagnosis Date   Anal skin tag    Atrial fibrillation ([place])    Depression    Diverticulosis    Diverticulosis, sigmoid    Dyslipidemia    GERD (gastroesophageal reflux disease)    Hemorrhoids    internal and external   History of diverticulitis    10/ 2016   History of galactorrhea    History of stress test    a. Pt reports h/o stress test and echo by cardiologist in [place] (no records in [place]).  Both reportedly nl.   Hypertension    IBS (irritable bowel syndrome)    Morbid obesity (HCC)    PONV (postoperative nausea and vomiting)    severe   Rash    elbows and behind knees   Restless leg syndrome    Sleep apnea    cpap sometimes   Wears glasses     Patient Active Problem List   Diagnosis Date Noted   Paroxysmal atrial fibrillation ([place])  01/04/2020   Essential hypertension    Atrial fibrillation with rapid ventricular response ([place]) 12/19/2019   Constipation 06/15/2011   Internal and external hemorrhoids without complication 61/95/0932   OSA (obstructive sleep apnea) 06/15/2011    Past Surgical History:  Procedure Laterality Date   BAND HEMORRHOIDECTOMY  2012   Dr Earlean Shawl   CESAREAN SECTION  1994   COLONOSCOPY  06/18/2011   CYSTO/  BILATERAL PYELOGRAM RETROGRADES/  TRANSOBTURATOR SLING  03/08/2006   EXCISION OF SKIN TAG N/A 02/13/2016   Procedure: ANAL SKIN TAG EXCISION;  Surgeon: [name], MD;  Location: [place];  Service: General;  Laterality: N/A;   LAPAROSCOPIC CHOLECYSTECTOMY  05/22/2001   SHOULDER SURGERY      Prior to Admission medications   Medication Sig Start Date End Date Taking? Authorizing Provider  buPROPion (WELLBUTRIN XL) 300 MG 24 hr tablet Take 300 mg by mouth every morning.     [provider]  butalbital-acetaminophen-caffeine (FIORICET) 50-325-40 MG tablet Take by mouth 2 (two) times daily as needed for headache.    [provider]  carvedilol (COREG) 25 MG tablet Take 1.5 tablets (37.5 mg total) by mouth 2 (two) times daily. 02/14/20   Marrianne Mood D, PA-C  dicyclomine (BENTYL) 10 MG capsule Take one tablet by mouth once to twice daily as needed 07/05/19   Armbruster, Carlota Raspberry, MD  diltiazem (CARDIZEM CD) 180  MG 24 hr capsule Take 1 capsule (180 mg total) by mouth in the morning and at bedtime. 11/21/20   Kate Sable, MD  ELIQUIS 5 MG TABS tablet TAKE 1 TABLET BY MOUTH TWICE DAILY 12/17/20   Vickie Epley, MD  escitalopram (LEXAPRO) 20 MG tablet Take 20 mg by mouth every morning.     [provider]  estradiol (ESTRACE) 2 MG tablet Take 2 mg by mouth daily. 10/28/20   [provider]  fenofibrate micronized (LOFIBRA) 200 MG capsule Take 200 mg by mouth daily before breakfast.  02/04/15   [provider]  flecainide  (TAMBOCOR) 50 MG tablet TAKE 1 TABLET BY MOUTH TWICE DAILY 03/19/21   Vickie Epley, MD  linaclotide [facility]) 72 MCG capsule Take one tablet by mouth every day for constipation prone IBS Patient not taking: Reported on 03/21/2021 10/31/15   [provider]  loratadine (ALAVERT) 10 MG dissolvable tablet Take 1 tablet by mouth daily. 03/11/18   [provider]  losartan (COZAAR) 100 MG tablet Take 100 mg by mouth every morning.  12/01/14   [provider]  meclizine (ANTIVERT) 25 MG tablet Take 1 tablet (25 mg total) by mouth 3 (three) times daily as needed for dizziness. 03/08/19   Lilia Pro., MD  meloxicam (MOBIC) 15 MG tablet Take 15 mg by mouth daily. 11/17/19   [provider]  mometasone (ELOCON) 0.1 % ointment Apply topically 2 (two) times daily. 06/11/20   [provider]  omega-3 acid ethyl esters (LOVAZA) 1 g capsule Take 1 capsule (1 g total) by mouth 2 (two) times daily. 01/25/20   Marrianne Mood D, PA-C  omeprazole (PRILOSEC) 20 MG capsule Take 1 capsule (20 mg total) by mouth daily. 03/21/21   Armbruster, Carlota Raspberry, MD  OZEMPIC, 2 MG/DOSE, 8 MG/3ML SOPN Inject into the skin. 03/19/21   [provider]  progesterone (PROMETRIUM) 200 MG capsule Take 200 mg by mouth at bedtime. 10/28/20   [provider]  PROGESTERONE PO Take 200 mg by mouth at bedtime.    [provider]  triamcinolone (KENALOG) 0.1 % Apply topically 2 (two) times daily. 05/15/20   [provider]  triamterene-hydrochlorothiazide (MAXZIDE-25) 37.5-25 MG per tablet Take 0.5 tablets by mouth every morning.     [provider]  valACYclovir (VALTREX) 1000 MG tablet Take 1,000 mg by mouth 2 (two) times daily as needed.     [provider]  valACYclovir (VALTREX) 500 MG tablet valacyclovir 500 mg tablet 06/24/20   [provider]  zolpidem (AMBIEN) 10 MG tablet Take 10 mg by mouth at bedtime as needed for sleep.     [provider]    Allergies Venlafaxine  Family History  Problem Relation Age of Onset   Diabetes Mother    Hypertension Mother    Atrial fibrillation Mother    Colon cancer Mother    Diabetes Father        committed suicide   Skin cancer Father    Suicidality Father    Hypertension Father    Diabetes Sister    Diabetes Sister    Breast cancer Other        materanl great aunt    Social History Social History   Tobacco Use   Smoking status: Never   Smokeless tobacco: Never  Vaping Use   Vaping Use: Never used  Substance Use Topics   Alcohol use: No    Alcohol/week: 0.0 standard drinks  Drug use: No    Review of Systems Constitutional: No fever/chills Eyes: No visual changes. ENT: No sore throat. Cardiovascular: Denies chest pain.  Endorses palpitations Respiratory: Denies shortness of breath. Gastrointestinal: No abdominal pain.  No nausea, no vomiting.  No diarrhea. Genitourinary: Negative for dysuria. Musculoskeletal: Negative for acute arthralgias Skin: Negative for rash. Neurological: Negative for headaches, weakness/numbness/paresthesias in any extremity Psychiatric: Negative for suicidal ideation/homicidal ideation   ____________________________________________   PHYSICAL EXAM:  VITAL SIGNS: ED Triage Vitals  Enc Vitals Group     BP 04/05/21 1849 (!) 118/101     Pulse Rate 04/05/21 1849 (!) 127     Resp 04/05/21 1849 18     Temp 04/05/21 1849 98.8 F (37.1 C)     Temp Source 04/05/21 1849 Oral     SpO2 04/05/21 1849 99 %     Weight 04/05/21 1850 224 lb 13.9 oz (102 kg)     Height 04/05/21 1850 5' 6.5" (1.689 m)     Head Circumference --      Peak Flow --      Pain Score 04/05/21 1850 10     Pain Loc --      Pain Edu? --      Excl. in [place]? --    Constitutional: Alert and oriented. Well appearing and in no acute distress. Eyes: Conjunctivae are normal. PERRL. Head: Atraumatic. Nose: No congestion/rhinnorhea. Mouth/Throat:  Mucous membranes are moist. Neck: No stridor Cardiovascular: Tachycardic irregularly irregular rhythm.  Good peripheral circulation. Respiratory: Normal respiratory effort.  No retractions. Gastrointestinal: Soft and nontender. No distention. Musculoskeletal: No obvious deformities Neurologic:  Normal speech and language. No gross focal neurologic deficits are appreciated. Skin:  Skin is warm and dry. No rash noted. Psychiatric: Mood and affect are normal. Speech and behavior are normal.  ____________________________________________   LABS (all labs ordered are listed, but only abnormal results are displayed)  Labs Reviewed  COMPREHENSIVE METABOLIC PANEL - Abnormal; Notable for the following components:      Result Value   CO2 21 (*)    Glucose, Bld 112 (*)    Alkaline Phosphatase 32 (*)    All other components within normal limits  CBC WITH DIFFERENTIAL/PLATELET - Abnormal; Notable for the following components:   WBC 11.2 (*)    Platelets 597 (*)    Abs Immature Granulocytes 0.18 (*)    All other components within normal limits  MAGNESIUM - Abnormal; Notable for the following components:   Magnesium 1.6 (*)    All other components within normal limits  URINE DRUG SCREEN, QUALITATIVE ([facility] ONLY) - Abnormal; Notable for the following components:   MDMA ([place])Ur Screen POSITIVE (*)    All other components within normal limits  RESP PANEL BY RT-PCR (FLU A&B, COVID) ARPGX2  BRAIN NATRIURETIC PEPTIDE  TROPONIN I (HIGH SENSITIVITY)  TROPONIN I (HIGH SENSITIVITY)   ____________________________________________  EKG  ED ECG REPORT I, Naaman Plummer, the attending physician, personally viewed and interpreted this ECG.  Date: 04/05/2021 EKG Time: 1852 Rate: 127 Rhythm: Atrial flutter with rapid ventricular response QRS Axis: normal Intervals: normal ST/T Wave abnormalities: normal Narrative Interpretation: Atrial flutter with rapid ventricular response.  No evidence of  acute ischemia  ____________________________________________  RADIOLOGY  ED MD interpretation: One-view portable chest x-[place] shows no evidence of acute abnormalities including no pneumonia, pneumothorax, or widened mediastinum  Official radiology report(s): DG Chest Port 1 View  Result Date: 04/05/2021 CLINICAL DATA:  Chest pain. EXAM: PORTABLE CHEST 1 VIEW COMPARISON:  12/19/2019 FINDINGS: Upper normal heart size is likely accentuated by portable AP technique.The cardiomediastinal contours are normal. Atherosclerosis of the aortic arch. Pulmonary vasculature is normal. No consolidation, pleural effusion, or pneumothorax. No acute osseous abnormalities are seen. IMPRESSION: No acute chest findings. Electronically Signed   By: Keith Rake M.D.   On: 04/05/2021 19:23    ____________________________________________   PROCEDURES  Procedure(s) performed (including Critical Care):  Procedures   ____________________________________________   INITIAL IMPRESSION / ASSESSMENT AND PLAN / ED COURSE  As part of my medical decision making, I reviewed the following data within the electronic medical record, if available:  Nursing notes reviewed and incorporated, Labs reviewed, EKG interpreted, Old chart reviewed, Radiograph reviewed and Notes from prior ED visits reviewed and incorporated        + atrial fibrillation w/ RVR DDx: Pneumothorax, Pneumonia, Pulmonary Embolus, Tamponade, ACS, Thyrotoxicosis.  No history or evidence decompensated heart failure. Given their history and exam it is likely this patient is unlikely to spontaneously revert to a rate controlled rhythm and necessitates a thorough workup for their arrhythmia. Workup: ECG, CXR, CBC, BMP, UA, Troponin, BNP, TSH, Ca-Mag-Phos Interventions: Defer Cardioversion (uncertain historical reliability with time of onset, increased risk of thromboembolic stroke).  Start diltiazem bolus and drip   Disposition: Admit       ____________________________________________   FINAL CLINICAL IMPRESSION(S) / ED DIAGNOSES  Final diagnoses:  Atrial fibrillation with rapid ventricular response ([place])  Palpitations  DOE (dyspnea on exertion)     ED Discharge Orders     None        Note:  This document was prepared using Dragon voice recognition software and may include unintentional dictation errors.    Naaman Plummer, MD 04/05/21 [phone]

## 2021-04-05 NOTE — H&P (Signed)
History and Physical    [name] Ross WIO:[id number] DOB: [birth year] DOA: 04/05/2021  PCP: Leanna Battles, MD    Patient coming from:  Home   Chief Complaint:  Tachycardia   HPI:  [name] Ross is a 55 y.o. female seen in ed with complaints of A. fib with tachycardia on her home monitor.  Patient did a virtual visit with primary care for similar problem 04/05/21 .  Per report patient is currently on flecainide diltiazem and carvedilol at which time the heart rate was in the 80s.  Patient denies any chest pain or any other complaints.  Patient was advised by primary to contact cardiology and to come to the emergency room if she is not able to contact cardiology. Patient was diagnosed with flu a few days on Wednesday  ago and started on Tamiflu.  Patient has been noticing high heart rates on her vital monitor at home.  Patient reported feeling weak and ros as outlined  without any falls or syncopal episodes.  Pt has past medical history of paroxysmal atrial fibrillation, hypertension, hemorrhoids, sleep apnea.  ED Course: In emergency room patient is tachycardic, oxygenating normal on room air. Vitals:   04/05/21 2130 04/05/21 2149 04/05/21 2200 04/06/21 0030  BP: 103/84  (!) 117/96 (!) 150/98  Pulse: (!) 123 100 (!) 115 99  Resp: 17 15 19 12   Temp:      TempSrc:      SpO2: 95% 99% 98% 100%  Weight:      Height:      Labs show sodium 135 glucose 112 magnesium 1.6 normal creatinine and liver function, CBC shows white count of 11.2 hemoglobin of 12.3. platelet 597.  In the emergency room patient started on a diltiazem drip.  Cardiology contacted by myself and Dr. Golden Hurter from [facility] to see patient tomorrow.  Review of Systems:  Review of Systems  Constitutional:  Positive for malaise/fatigue.  Respiratory:  Positive for cough.   Cardiovascular:  Positive for palpitations.  Gastrointestinal:  Positive for nausea and vomiting.  All other systems reviewed  and are negative.   Past Medical History:  Diagnosis Date   Anal skin tag    Atrial fibrillation ([place])    Depression    Diverticulosis    Diverticulosis, sigmoid    Dyslipidemia    GERD (gastroesophageal reflux disease)    Hemorrhoids    internal and external   History of diverticulitis    10/ 2016   History of galactorrhea    History of stress test    a. Pt reports h/o stress test and echo by cardiologist in [place] (no records in [place]).  Both reportedly nl.   Hypertension    IBS (irritable bowel syndrome)    Morbid obesity (HCC)    PONV (postoperative nausea and vomiting)    severe   Rash    elbows and behind knees   Restless leg syndrome    Sleep apnea    cpap sometimes   Wears glasses     Past Surgical History:  Procedure Laterality Date   BAND HEMORRHOIDECTOMY  2012   Dr Earlean Shawl   CESAREAN SECTION  1994   COLONOSCOPY  06/18/2011   CYSTO/  BILATERAL PYELOGRAM RETROGRADES/  TRANSOBTURATOR SLING  03/08/2006   EXCISION OF SKIN TAG N/A 02/13/2016   Procedure: ANAL SKIN TAG EXCISION;  Surgeon: [name], MD;  [facility]: [facility];  Service: General;  Laterality: N/A;   LAPAROSCOPIC CHOLECYSTECTOMY  05/22/2001  SHOULDER SURGERY       reports that she has never smoked. She has never used smokeless tobacco. She reports that she does not drink alcohol and does not use drugs.  Allergies  Allergen Reactions   Venlafaxine     Other reaction(s): sleepy    Family History  Problem Relation Age of Onset   Diabetes Mother    Hypertension Mother    Atrial fibrillation Mother    Colon cancer Mother    Diabetes Father        committed suicide   Skin cancer Father    Suicidality Father    Hypertension Father    Diabetes Sister    Diabetes Sister    Breast cancer Other        materanl great aunt    Prior to Admission medications   Medication Sig Start Date End Date Taking? Authorizing Provider  buPROPion (WELLBUTRIN XL) 300 MG 24 hr tablet Take 300  mg by mouth every morning.     [provider]  butalbital-acetaminophen-caffeine (FIORICET) 50-325-40 MG tablet Take by mouth 2 (two) times daily as needed for headache.    [provider]  carvedilol (COREG) 25 MG tablet Take 1.5 tablets (37.5 mg total) by mouth 2 (two) times daily. 02/14/20   Marrianne Mood D, PA-C  dicyclomine (BENTYL) 10 MG capsule Take one tablet by mouth once to twice daily as needed 07/05/19   Armbruster, Carlota Raspberry, MD  diltiazem (CARDIZEM CD) 180 MG 24 hr capsule Take 1 capsule (180 mg total) by mouth in the morning and at bedtime. 11/21/20   Kate Sable, MD  ELIQUIS 5 MG TABS tablet TAKE 1 TABLET BY MOUTH TWICE DAILY 12/17/20   Vickie Epley, MD  escitalopram (LEXAPRO) 20 MG tablet Take 20 mg by mouth every morning.     [provider]  estradiol (ESTRACE) 2 MG tablet Take 2 mg by mouth daily. 10/28/20   [provider]  fenofibrate micronized (LOFIBRA) 200 MG capsule Take 200 mg by mouth daily before breakfast.  02/04/15   [provider]  flecainide (TAMBOCOR) 50 MG tablet TAKE 1 TABLET BY MOUTH TWICE DAILY 03/19/21   Vickie Epley, MD  linaclotide [facility]) 72 MCG capsule Take one tablet by mouth every day for constipation prone IBS Patient not taking: Reported on 03/21/2021 10/31/15   [provider]  loratadine (ALAVERT) 10 MG dissolvable tablet Take 1 tablet by mouth daily. 03/11/18   [provider]  losartan (COZAAR) 100 MG tablet Take 100 mg by mouth every morning.  12/01/14   [provider]  meclizine (ANTIVERT) 25 MG tablet Take 1 tablet (25 mg total) by mouth 3 (three) times daily as needed for dizziness. 03/08/19   Lilia Pro., MD  meloxicam (MOBIC) 15 MG tablet Take 15 mg by mouth daily. 11/17/19   [provider]  mometasone (ELOCON) 0.1 % ointment Apply topically 2 (two) times daily. 06/11/20   [provider]  omega-3 acid ethyl esters (LOVAZA) 1 g capsule  Take 1 capsule (1 g total) by mouth 2 (two) times daily. 01/25/20   Marrianne Mood D, PA-C  omeprazole (PRILOSEC) 20 MG capsule Take 1 capsule (20 mg total) by mouth daily. 03/21/21   Armbruster, Carlota Raspberry, MD  OZEMPIC, 2 MG/DOSE, 8 MG/3ML SOPN Inject into the skin. 03/19/21   [provider]  progesterone (PROMETRIUM) 200 MG capsule Take 200 mg by mouth at bedtime. 10/28/20   [provider]  PROGESTERONE  PO Take 200 mg by mouth at bedtime.    [provider]  triamcinolone (KENALOG) 0.1 % Apply topically 2 (two) times daily. 05/15/20   [provider]  triamterene-hydrochlorothiazide (MAXZIDE-25) 37.5-25 MG per tablet Take 0.5 tablets by mouth every morning.     [provider]  valACYclovir (VALTREX) 1000 MG tablet Take 1,000 mg by mouth 2 (two) times daily as needed.     [provider]  valACYclovir (VALTREX) 500 MG tablet valacyclovir 500 mg tablet 06/24/20   [provider]  zolpidem (AMBIEN) 10 MG tablet Take 10 mg by mouth at bedtime as needed for sleep.    [provider]    Physical Exam: Vitals:   04/05/21 2130 04/05/21 2149 04/05/21 2200 04/06/21 0030  BP: 103/84  (!) 117/96 (!) 150/98  Pulse: (!) 123 100 (!) 115 99  Resp: 17 15 19 12   Temp:      TempSrc:      SpO2: 95% 99% 98% 100%  Weight:      Height:       Physical Exam   Labs on Admission: I have personally reviewed following labs and imaging studies  No results for input(s): CKTOTAL, CKMB, TROPONINI in the last 72 hours. Lab Results  Component Value Date   WBC 11.2 (H) 04/05/2021   HGB 12.3 04/05/2021   HCT 36.1 04/05/2021   MCV 87.0 04/05/2021   PLT 597 (H) 04/05/2021    Recent Labs  Lab 04/05/21 1912  NA 135  K 4.0  CL 105  CO2 21*  BUN 14  CREATININE 0.87  CALCIUM 9.0  PROT 7.7  BILITOT 0.7  ALKPHOS 32*  ALT 14  AST 22  GLUCOSE 112*   Lab Results  Component Value Date   CHOL 204 (H) 12/20/2019   HDL 39 (L) 12/20/2019    LDLCALC UNABLE TO CALCULATE IF TRIGLYCERIDE OVER 400 mg/dL 12/20/2019   TRIG 510 (H) 12/20/2019   Lab Results  Component Value Date   DDIMER  01/22/2007    <0.22        AT THE INHOUSE ESTABLISHED CUTOFF VALUE OF 0.48 ug/mL FEU, THIS ASSAY HAS BEEN DOCUMENTED IN THE LITERATURE TO HAVE    COVID-19 Labs No results for input(s): DDIMER, FERRITIN, LDH, CRP in the last 72 hours. Lab Results  Component Value Date   SARSCOV2NAA NEGATIVE 04/05/2021   [place] NEGATIVE 08/29/2020   [place] NEGATIVE 12/19/2019    Radiological Exams on Admission: CT Angio Chest Pulmonary Embolism (PE) W or WO Contrast  Result Date: 04/06/2021 CLINICAL DATA:  56 year old female with respiratory failure, concern for pulmonary embolism. EXAM: CT ANGIOGRAPHY CHEST WITH CONTRAST TECHNIQUE: Multidetector CT imaging of the chest was performed using the standard protocol during bolus administration of intravenous contrast. Multiplanar CT image reconstructions and MIPs were obtained to evaluate the vascular anatomy. CONTRAST:  Seventy mL Omnipaque 350, intravenous COMPARISON:  None. FINDINGS: Cardiovascular: Satisfactory opacification of the pulmonary arteries to the segmental level. No evidence of pulmonary embolism. Normal heart size. No pericardial effusion. Mediastinum/Nodes: No enlarged mediastinal, hilar, or axillary lymph nodes. Thyroid gland, trachea, and esophagus demonstrate no significant findings. Lungs/Pleura: No focal consolidations. No suspicious pulmonary nodules. No pleural effusion or pneumothorax. Upper Abdomen: Marked diffuse attenuation of the hepatic parenchyma. Status post cholecystectomy. The remaining visualized upper abdomen is within normal limits. Musculoskeletal: No chest wall abnormality. No acute or significant osseous findings. Review of the MIP images confirms the above findings. IMPRESSION: Vascular: No evidence of pulmonary embolism.  Non-Vascular: 1. No acute intrathoracic abnormality.  2. Moderate hepatic steatosis. Ruthann Cancer, MD [facility] Radiology Electronically Signed   By: Ruthann Cancer M.D.   On: 04/06/2021 00:44   DG Chest Port 1 View  Result Date: 04/05/2021 CLINICAL DATA:  Chest pain. EXAM: PORTABLE CHEST 1 VIEW COMPARISON:  12/19/2019 FINDINGS: Upper normal heart size is likely accentuated by portable AP technique.The cardiomediastinal contours are normal. Atherosclerosis of the aortic arch. Pulmonary vasculature is normal. No consolidation, pleural effusion, or pneumothorax. No acute osseous abnormalities are seen. IMPRESSION: No acute chest findings. Electronically Signed   By: Keith Rake M.D.   On: 04/05/2021 19:23    EKG: Independently reviewed.  Atrial flutter 127.  Assessment/Plan: Patient is a 55 year old Caucasian female coming to [place] with palpitations fast heart rate and a history of atrial fibrillation found to be in A. fib RVR in the emergency room today.   Principal Problem:   Atrial fibrillation with rapid ventricular response (HCC) Active Problems:   OSA (obstructive sleep apnea)   Essential hypertension   Hypomagnesemia   Sinusitis, acute  A. fib RVR: Patient is a 55 year old female coming to [place] with a history of atrial fibrillation managed on Eliquis and flecainide followed by cardiology Dr. Quentin Ore with tachycardia and malaise since past few days.  Patient has been noticing high heart rates on her monitor. Patient has a CHA2DS2-VASc score of 2/3 although she is not a diagnosed diabetic she is a borderline diabetic.  We will continue patient on Eliquis. Will admit her to progressive unit with continuous cardiac monitoring and pulse oximetry checks her vitals protocols per floor. We will continue patient on a diltiazem drip, with IV transition to p.o. when patient is converted. Cardiology consulted-Dr. Radford Pax to see patient in AM. Home medication regimen held currently. Suspect  hypomagnesemia to be contributing to her A. fib RVR and we will check patient's thyroid panel as well. We will schedule an echocardiogram. Discussed with patient about obtaining a CT angio of her chest. As she is on HRT and is at high risk for pulmonary embolism. CT angio chest has resulted and is negative for pulmonary embolism, and shows moderate hepatic steatosis, no pericardial effusion and normal heart size.   Obstructive sleep apnea: CPAP per home settings.  Hypertension: Blood pressure (!) 150/98, pulse 99, temperature 98.8 F (37.1 C), temperature source Oral, resp. rate 12, height 5' 6.5" (1.689 m), weight 102 kg, SpO2 100 %. Home regimen currently held as patient is on diltiazem drip for rate control. Resume once med rec is available and patient is converted. Heart healthy diet.  Anemia: Patient noted to have anemia from July 2021 to April 2022. Hemoglobin today is 12.3, we will follow and monitor.  Anemia panel, iv ppi, type and screen and stool guaiac if it drops.  Hypomagnesemia: Attribute to patient's nausea vomiting, and diuretic therapy. We will replace with 2 g of magnesium IV and follow.   DVT prophylaxis:  Eliquis  Code Status:  Full code  Family Communication:  Morden,Mark (Spouse)  [phone] (Mobile)   Disposition Plan:  Home  Consults called:  Cardiology-Dr. Golden Hurter  Admission status: Inpatient.   Para Skeans MD [facility] [phone] How to contact the [facility] Attending or Consulting provider [place] or covering provider during after hours [place], for this patient.    Check the care team in [facility] and look for a) attending/consulting [facility] provider listed and b) the [facility] team listed  Log into [email or web address] and use [place]'s universal password to access. If you do not have the password, please contact the hospital operator. Locate the [facility] provider you are looking for under [facility] and page to a number that you can be  directly reached. If you still have difficulty reaching the provider, please page the [facility] (Director on Call) for the Hospitalists listed on amion for assistance. [email or web address] Password [email or web address] 04/06/2021, 1:16 AM

## 2021-04-05 NOTE — Patient Instructions (Signed)
[name], thank you for joining Gildardo Pounds, NP for today's virtual visit.  While this provider is not your primary care provider (PCP), if your PCP is located in our provider database this encounter information will be shared with them immediately following your visit.  Other Instructions If unable to reach after-hours cardiology service you will need to be evaluated in the emergency room for your current symptoms.    Consent: (Patient) Bo Merino Chio provided verbal consent for this virtual visit at the beginning of the encounter.  Current Medications:  Current Outpatient Medications:    buPROPion (WELLBUTRIN XL) 300 MG 24 hr tablet, Take 300 mg by mouth every morning. , Disp: , Rfl:    butalbital-acetaminophen-caffeine (FIORICET) 50-325-40 MG tablet, Take by mouth 2 (two) times daily as needed for headache., Disp: , Rfl:    carvedilol (COREG) 25 MG tablet, Take 1.5 tablets (37.5 mg total) by mouth 2 (two) times daily., Disp: 90 tablet, Rfl: 3   dicyclomine (BENTYL) 10 MG capsule, Take one tablet by mouth once to twice daily as needed, Disp: 90 capsule, Rfl: 3   diltiazem (CARDIZEM CD) 180 MG 24 hr capsule, Take 1 capsule (180 mg total) by mouth in the morning and at bedtime., Disp: 180 capsule, Rfl: 1   ELIQUIS 5 MG TABS tablet, TAKE 1 TABLET BY MOUTH TWICE DAILY, Disp: 60 tablet, Rfl: 9   escitalopram (LEXAPRO) 20 MG tablet, Take 20 mg by mouth every morning. , Disp: , Rfl:    estradiol (ESTRACE) 2 MG tablet, Take 2 mg by mouth daily., Disp: , Rfl:    fenofibrate micronized (LOFIBRA) 200 MG capsule, Take 200 mg by mouth daily before breakfast. , Disp: , Rfl: 4   flecainide (TAMBOCOR) 50 MG tablet, TAKE 1 TABLET BY MOUTH TWICE DAILY, Disp: 180 tablet, Rfl: 2   linaclotide (LINZESS) 72 MCG capsule, Take one tablet by mouth every day for constipation prone IBS (Patient not taking: Reported on 03/21/2021), Disp: , Rfl:    loratadine (ALAVERT) 10 MG dissolvable tablet, Take 1 tablet  by mouth daily., Disp: , Rfl:    losartan (COZAAR) 100 MG tablet, Take 100 mg by mouth every morning. , Disp: , Rfl: 3   meclizine (ANTIVERT) 25 MG tablet, Take 1 tablet (25 mg total) by mouth 3 (three) times daily as needed for dizziness., Disp: 30 tablet, Rfl: 0   meloxicam (MOBIC) 15 MG tablet, Take 15 mg by mouth daily., Disp: , Rfl:    mometasone (ELOCON) 0.1 % ointment, Apply topically 2 (two) times daily., Disp: , Rfl:    omega-3 acid ethyl esters (LOVAZA) 1 g capsule, Take 1 capsule (1 g total) by mouth 2 (two) times daily., Disp: 60 capsule, Rfl: 5   omeprazole (PRILOSEC) 20 MG capsule, Take 1 capsule (20 mg total) by mouth daily., Disp: 90 capsule, Rfl: 1   OZEMPIC, 2 MG/DOSE, 8 MG/3ML SOPN, Inject into the skin., Disp: , Rfl:    progesterone (PROMETRIUM) 200 MG capsule, Take 200 mg by mouth at bedtime., Disp: , Rfl:    PROGESTERONE PO, Take 200 mg by mouth at bedtime., Disp: , Rfl:    triamcinolone (KENALOG) 0.1 %, Apply topically 2 (two) times daily., Disp: , Rfl:    triamterene-hydrochlorothiazide (MAXZIDE-25) 37.5-25 MG per tablet, Take 0.5 tablets by mouth every morning. , Disp: , Rfl:    valACYclovir (VALTREX) 1000 MG tablet, Take 1,000 mg by mouth 2 (two) times daily as needed. , Disp: , Rfl:  valACYclovir (VALTREX) 500 MG tablet, valacyclovir 500 mg tablet, Disp: , Rfl:    zolpidem (AMBIEN) 10 MG tablet, Take 10 mg by mouth at bedtime as needed for sleep., Disp: , Rfl:    Medications ordered in this encounter:  No orders of the defined types were placed in this encounter.    *If you need refills on other medications prior to your next appointment, please contact your pharmacy*  Follow-Up: Call back or seek an in-person evaluation if the symptoms worsen or if the condition fails to improve as anticipated.     If you have been instructed to have an in-person evaluation today at a local Urgent Care facility, please use the link below. It will take you to a list of all of  our available [facility], including address, phone number and hours of operation. Please do not delay care.  [facility] [facility]  If you or a family member do not have a primary care provider, use the link below to schedule a visit and establish care. When you choose a [place] primary care physician or advanced practice provider, you gain a long-term partner in health. Find a Primary Care Provider  Learn more about [place]'s in-office and virtual care options: [place] [facility]

## 2021-04-06 ENCOUNTER — Encounter: Payer: Self-pay | Admitting: Internal Medicine

## 2021-04-06 ENCOUNTER — Emergency Department: Payer: 59

## 2021-04-06 ENCOUNTER — Inpatient Hospital Stay: Admit: 2021-04-06 | Payer: 59

## 2021-04-06 DIAGNOSIS — E876 Hypokalemia: Secondary | ICD-10-CM | POA: Diagnosis present

## 2021-04-06 DIAGNOSIS — I10 Essential (primary) hypertension: Secondary | ICD-10-CM | POA: Diagnosis present

## 2021-04-06 DIAGNOSIS — I4891 Unspecified atrial fibrillation: Secondary | ICD-10-CM | POA: Diagnosis not present

## 2021-04-06 DIAGNOSIS — Z20822 Contact with and (suspected) exposure to covid-19: Secondary | ICD-10-CM | POA: Diagnosis present

## 2021-04-06 DIAGNOSIS — I4892 Unspecified atrial flutter: Secondary | ICD-10-CM | POA: Diagnosis present

## 2021-04-06 DIAGNOSIS — R002 Palpitations: Secondary | ICD-10-CM | POA: Diagnosis present

## 2021-04-06 DIAGNOSIS — Z6835 Body mass index (BMI) 35.0-35.9, adult: Secondary | ICD-10-CM | POA: Diagnosis not present

## 2021-04-06 DIAGNOSIS — G8929 Other chronic pain: Secondary | ICD-10-CM | POA: Diagnosis present

## 2021-04-06 DIAGNOSIS — G2581 Restless legs syndrome: Secondary | ICD-10-CM | POA: Diagnosis present

## 2021-04-06 DIAGNOSIS — E785 Hyperlipidemia, unspecified: Secondary | ICD-10-CM | POA: Diagnosis present

## 2021-04-06 DIAGNOSIS — Z7901 Long term (current) use of anticoagulants: Secondary | ICD-10-CM | POA: Diagnosis not present

## 2021-04-06 DIAGNOSIS — J069 Acute upper respiratory infection, unspecified: Secondary | ICD-10-CM | POA: Diagnosis not present

## 2021-04-06 DIAGNOSIS — I480 Paroxysmal atrial fibrillation: Secondary | ICD-10-CM | POA: Diagnosis present

## 2021-04-06 DIAGNOSIS — Z791 Long term (current) use of non-steroidal anti-inflammatories (NSAID): Secondary | ICD-10-CM | POA: Diagnosis not present

## 2021-04-06 DIAGNOSIS — Z833 Family history of diabetes mellitus: Secondary | ICD-10-CM | POA: Diagnosis not present

## 2021-04-06 DIAGNOSIS — M549 Dorsalgia, unspecified: Secondary | ICD-10-CM | POA: Diagnosis present

## 2021-04-06 DIAGNOSIS — Z888 Allergy status to other drugs, medicaments and biological substances status: Secondary | ICD-10-CM | POA: Diagnosis not present

## 2021-04-06 DIAGNOSIS — G4733 Obstructive sleep apnea (adult) (pediatric): Secondary | ICD-10-CM

## 2021-04-06 DIAGNOSIS — Z8249 Family history of ischemic heart disease and other diseases of the circulatory system: Secondary | ICD-10-CM | POA: Diagnosis not present

## 2021-04-06 DIAGNOSIS — Z79899 Other long term (current) drug therapy: Secondary | ICD-10-CM | POA: Diagnosis not present

## 2021-04-06 LAB — BASIC METABOLIC PANEL
Anion gap: 11 (ref 5–15)
BUN: 14 mg/dL (ref 6–20)
CO2: 21 mmol/L — ABNORMAL LOW (ref 22–32)
Calcium: 9.1 mg/dL (ref 8.9–10.3)
Chloride: 105 mmol/L (ref 98–111)
Creatinine, Ser: 0.73 mg/dL (ref 0.44–1.00)
GFR, Estimated: >60 mL/min (ref >60)
Glucose, Bld: 119 mg/dL — ABNORMAL HIGH (ref 70–99)
Potassium: 3.5 mmol/L (ref 3.5–5.1)
Sodium: 137 mmol/L (ref 135–145)

## 2021-04-06 LAB — TSH: TSH: 3.374 u[IU]/mL (ref 0.350–4.500)

## 2021-04-06 LAB — HEMOGLOBIN A1C
Hgb A1c MFr Bld: 5.9 % — ABNORMAL HIGH (ref 4.8–5.6)
Mean Plasma Glucose: 122.63 mg/dL

## 2021-04-06 LAB — CBC
HCT: 36.4 % (ref 36.0–46.0)
Hemoglobin: 12.4 g/dL (ref 12.0–15.0)
MCH: 29.3 pg (ref 26.0–34.0)
MCHC: 34.1 g/dL (ref 30.0–36.0)
MCV: 86.1 fL (ref 80.0–100.0)
Platelets: 582 10*3/uL — ABNORMAL HIGH (ref 150–400)
RBC: 4.23 MIL/uL (ref 3.87–5.11)
RDW: 12.6 % (ref 11.5–15.5)
WBC: 11.7 10*3/uL — ABNORMAL HIGH (ref 4.0–10.5)
nRBC: 0 % (ref 0.0–0.2)

## 2021-04-06 LAB — T4, FREE: Free T4: 0.98 ng/dL (ref 0.61–1.12)

## 2021-04-06 LAB — MAGNESIUM: Magnesium: 2 mg/dL (ref 1.7–2.4)

## 2021-04-06 LAB — HIV ANTIBODY (ROUTINE TESTING W REFLEX): HIV Screen 4th Generation wRfx: NONREACTIVE

## 2021-04-06 MED ORDER — HYDRALAZINE HCL 20 MG/ML IJ SOLN: 5.0000 mg | INTRAMUSCULAR | Status: DC | PRN

## 2021-04-06 MED ORDER — ACETAMINOPHEN 325 MG PO TABS
650.0000 mg | ORAL_TABLET | Freq: Four times a day (QID) | ORAL | Status: DC | PRN
  Administered 2021-04-06 – 2021-04-08 (×4): 650 mg via ORAL
  Filled 2021-04-06 (×4): qty 2

## 2021-04-06 MED ORDER — MOMETASONE FUROATE 0.1 % EX OINT: TOPICAL_OINTMENT | Freq: Two times a day (BID) | CUTANEOUS | Status: DC | PRN

## 2021-04-06 MED ORDER — ONDANSETRON HCL 4 MG/2ML IJ SOLN
4.0000 mg | Freq: Four times a day (QID) | INTRAMUSCULAR | Status: DC | PRN
  Administered 2021-04-08: 4 mg via INTRAVENOUS
  Filled 2021-04-06: qty 2

## 2021-04-06 MED ORDER — FLECAINIDE ACETATE 50 MG PO TABS
50.0000 mg | ORAL_TABLET | Freq: Two times a day (BID) | ORAL | Status: DC
  Administered 2021-04-06 – 2021-04-08 (×5): 50 mg via ORAL
  Filled 2021-04-06 (×7): qty 1

## 2021-04-06 MED ORDER — CARVEDILOL 25 MG PO TABS
37.5000 mg | ORAL_TABLET | Freq: Two times a day (BID) | ORAL | Status: DC
  Administered 2021-04-06 – 2021-04-08 (×5): 37.5 mg via ORAL
  Filled 2021-04-06 (×2): qty 1
  Filled 2021-04-06: qty 2
  Filled 2021-04-06: qty 1
  Filled 2021-04-06: qty 2

## 2021-04-06 MED ORDER — BUTALBITAL-APAP-CAFFEINE 50-325-40 MG PO TABS
1.0000 | ORAL_TABLET | Freq: Four times a day (QID) | ORAL | Status: DC | PRN
  Administered 2021-04-07: 1 via ORAL
  Filled 2021-04-06: qty 1

## 2021-04-06 MED ORDER — APIXABAN 5 MG PO TABS
5.0000 mg | ORAL_TABLET | Freq: Two times a day (BID) | ORAL | Status: DC
  Administered 2021-04-06 – 2021-04-08 (×5): 5 mg via ORAL
  Filled 2021-04-06 (×6): qty 1

## 2021-04-06 MED ORDER — TRAZODONE HCL 50 MG PO TABS
25.0000 mg | ORAL_TABLET | Freq: Every day | ORAL | Status: DC
  Administered 2021-04-06 – 2021-04-07 (×2): 25 mg via ORAL
  Filled 2021-04-06 (×2): qty 1

## 2021-04-06 MED ORDER — BUPROPION HCL ER (XL) 150 MG PO TB24
300.0000 mg | ORAL_TABLET | Freq: Every morning | ORAL | Status: DC
  Administered 2021-04-06 – 2021-04-08 (×3): 300 mg via ORAL
  Filled 2021-04-06 (×3): qty 2

## 2021-04-06 MED ORDER — ACETAMINOPHEN 650 MG RE SUPP
650.0000 mg | Freq: Four times a day (QID) | RECTAL | Status: DC | PRN
  Filled 2021-04-06: qty 1

## 2021-04-06 MED ORDER — MOMETASONE FUROATE 0.1 % EX OINT: TOPICAL_OINTMENT | Freq: Two times a day (BID) | CUTANEOUS | Status: DC

## 2021-04-06 MED ORDER — PANTOPRAZOLE SODIUM 40 MG PO TBEC
40.0000 mg | DELAYED_RELEASE_TABLET | Freq: Every day | ORAL | Status: DC
  Administered 2021-04-06 – 2021-04-08 (×3): 40 mg via ORAL
  Filled 2021-04-06 (×3): qty 1

## 2021-04-06 MED ORDER — IOHEXOL 350 MG/ML SOLN
80.0000 mL | Freq: Once | INTRAVENOUS | Status: AC | PRN
  Administered 2021-04-06: 80 mL via INTRAVENOUS

## 2021-04-06 MED ORDER — POTASSIUM CHLORIDE CRYS ER 20 MEQ PO TBCR
40.0000 meq | EXTENDED_RELEASE_TABLET | Freq: Once | ORAL | Status: AC
  Administered 2021-04-06: 40 meq via ORAL
  Filled 2021-04-06: qty 2

## 2021-04-06 MED ORDER — ONDANSETRON HCL 4 MG PO TABS: 4.0000 mg | ORAL_TABLET | Freq: Four times a day (QID) | ORAL | Status: DC | PRN

## 2021-04-06 MED ORDER — BLISTEX MEDICATED EX OINT
1.0000 "application " | TOPICAL_OINTMENT | CUTANEOUS | Status: DC | PRN
  Administered 2021-04-06: 1 via TOPICAL
  Filled 2021-04-06 (×2): qty 6.3

## 2021-04-06 MED ORDER — NICOTINE 14 MG/24HR TD PT24
14.0000 mg | MEDICATED_PATCH | Freq: Every day | TRANSDERMAL | Status: DC
  Filled 2021-04-06 (×2): qty 1

## 2021-04-06 MED ORDER — TRIAMTERENE-HCTZ 37.5-25 MG PO TABS
0.5000 | ORAL_TABLET | Freq: Every morning | ORAL | Status: DC
  Administered 2021-04-06 – 2021-04-08 (×3): 0.5 via ORAL
  Filled 2021-04-06 (×4): qty 0.5

## 2021-04-06 MED ORDER — PHENOL 1.4 % MT LIQD
1.0000 | OROMUCOSAL | Status: DC | PRN
  Administered 2021-04-06: 1 via OROMUCOSAL
  Filled 2021-04-06 (×2): qty 177

## 2021-04-06 NOTE — ED Notes (Signed)
Pharmacy called and will bring diltiazem drip

## 2021-04-06 NOTE — Progress Notes (Addendum)
Progress Note    [name] Ross  GEX:[id number] DOB: 1965/12/01  DOA: 04/05/2021 PCP: Leanna Battles, MD      Brief Narrative:    Medical records reviewed and are as summarized below:  [name] Ross is a 55 y.o. female with medical history significant for paroxysmal atrial fibrillation, GERD, OSA, morbid obesity, hypertension, who presented to the hospital because of elevated heart rate.  She said she was started on Tamiflu few days prior to admission for influenza A infection.      Assessment/Plan:   Principal Problem:   Atrial fibrillation with rapid ventricular response (HCC) Active Problems:   OSA (obstructive sleep apnea)   Essential hypertension   Hypomagnesemia   Sinusitis, acute   Body mass index is 35.75 kg/m.  (Morbid obesity)  Atrial fibrillation/atrial flutter with RVR: Continue carvedilol, IV Cardizem infusion and Eliquis.  Hypomagnesemia: Replete magnesium as needed and monitor levels.  Low normal potassium: Replete potassium because of arrhythmia.  Hypertension: Continue antihypertensives  Upper respiratory tract infection: Influenza test was negative.  Discontinue Unasyn.  Urine drug screen was positive for ecstasy.  However, patient said she has never used any ecstasy or illicit drugs.  This may be a false positive test.   Diet Order             Diet Heart Room service appropriate? Yes; Fluid consistency: Thin  Diet effective now                      Consultants: Cardiologist  Procedures: None    Medications:    apixaban  5 mg Oral BID   buPROPion  300 mg Oral q morning   carvedilol  37.5 mg Oral BID WC   nicotine  14 mg Transdermal Daily   pantoprazole  40 mg Oral Daily   triamterene-hydrochlorothiazide  0.5 tablet Oral q morning   Continuous Infusions:  diltiazem (CARDIZEM) infusion 15 mg/hr (04/06/21 0130)     Anti-infectives (From admission, onward)    Start     Dose/Rate Route Frequency Ordered Stop    04/05/21 2345  Ampicillin-Sulbactam (UNASYN) 3 g in sodium chloride 0.9 % 100 mL IVPB  Status:  Discontinued        3 g 200 mL/hr over 30 Minutes Intravenous Every 8 hours 04/05/21 2335 04/06/21 0830              Family Communication/Anticipated D/C date and plan/Code Status   DVT prophylaxis:  apixaban (ELIQUIS) tablet 5 mg     Code Status: Full Code  Family Communication: None Disposition Plan: Possible discharge to home in 1 to 2 days   Status is: Inpatient  Remains inpatient appropriate because: IV Cardizem           Subjective:   Interval events noted.  Heart rate is still uncontrolled  Objective:    Vitals:   04/06/21 0530 04/06/21 0600 04/06/21 0630 04/06/21 1000  BP: 131/68 (!) 131/98 (!) 150/83 135/71  Pulse: (!) 102 61 (!) 56 (!) 135  Resp: 12  19 15   Temp:      TempSrc:      SpO2: 99% 94% 93% 98%  Weight:      Height:       No data found.   Intake/Output Summary (Last 24 hours) at 04/06/2021 1037 Last data filed at 04/06/2021 0207 Gross per 24 hour  Intake 150 ml  Output --  Net 150 ml   [employer]  04/05/21 1850  Weight: 102 kg    Exam:  GEN: NAD SKIN: No rash EYES: EOMI ENT: MMM CV: Irregular rate and rhythm PULM: CTA B ABD: soft, ND, NT, +BS CNS: AAO x 3, non focal EXT: No edema or tenderness        Data Reviewed:   I have personally reviewed following labs and imaging studies:  Labs: Labs show the following:   Basic Metabolic Panel: Recent Labs  Lab 04/05/21 1912 04/06/21 0707  NA 135 137  K 4.0 3.5  CL 105 105  CO2 21* 21*  GLUCOSE 112* 119*  BUN 14 14  CREATININE 0.87 0.73  CALCIUM 9.0 9.1  MG 1.6*  --    GFR Estimated Creatinine Clearance: 96.7 mL/min (by C-G formula based on SCr of 0.73 mg/dL). Liver Function Tests: Recent Labs  Lab 04/05/21 1912  AST 22  ALT 14  ALKPHOS 32*  BILITOT 0.7  PROT 7.7  ALBUMIN 3.5   No results for input(s): LIPASE, AMYLASE in the last 168  hours. No results for input(s): AMMONIA in the last 168 hours. Coagulation profile No results for input(s): INR, PROTIME in the last 168 hours.  CBC: Recent Labs  Lab 04/05/21 1912 04/06/21 0707  WBC 11.2* 11.7*  NEUTROABS 7.7  --   HGB 12.3 12.4  HCT 36.1 36.4  MCV 87.0 86.1  PLT 597* 582*   Cardiac Enzymes: No results for input(s): CKTOTAL, CKMB, CKMBINDEX, TROPONINI in the last 168 hours. BNP (last 3 results) No results for input(s): PROBNP in the last 8760 hours. CBG: No results for input(s): GLUCAP in the last 168 hours. D-Dimer: No results for input(s): DDIMER in the last 72 hours. Hgb A1c: No results for input(s): HGBA1C in the last 72 hours. Lipid Profile: No results for input(s): CHOL, HDL, LDLCALC, TRIG, CHOLHDL, LDLDIRECT in the last 72 hours. Thyroid function studies: Recent Labs    04/06/21 0707  TSH 3.374   Anemia work up: No results for input(s): VITAMINB12, FOLATE, FERRITIN, TIBC, IRON, RETICCTPCT in the last 72 hours. Sepsis Labs: Recent Labs  Lab 04/05/21 1912 04/06/21 0707  WBC 11.2* 11.7*    Microbiology Recent Results (from the past 240 hour(s))  Resp Panel by RT-PCR (Flu A&B, Covid) Nasopharyngeal Swab     Status: None   Collection Time: 04/05/21  7:12 PM   Specimen: Nasopharyngeal Swab; Nasopharyngeal(NP) swabs in vial transport medium  Result Value Ref Range Status   SARS Coronavirus 2 by RT PCR NEGATIVE NEGATIVE Final    Comment: (NOTE) SARS-CoV-2 target nucleic acids are NOT DETECTED.  The SARS-CoV-2 RNA is generally detectable in upper respiratory specimens during the acute phase of infection. The lowest concentration of SARS-CoV-2 viral copies this assay can detect is 138 copies/mL. A negative result does not preclude SARS-Cov-2 infection and should not be used as the sole basis for treatment or other patient management decisions. A negative result may occur with  improper specimen collection/handling, submission of specimen  other than nasopharyngeal swab, presence of viral mutation(s) within the areas targeted by this assay, and inadequate number of viral copies(<138 copies/mL). A negative result must be combined with clinical observations, patient history, and epidemiological information. The expected result is Negative.  Fact Sheet for Patients:  EntrepreneurPulse.com.au  Fact Sheet for Healthcare Providers:  IncredibleEmployment.be  This test is no t yet approved or cleared by the [place] FDA and  has been authorized for detection and/or diagnosis of SARS-CoV-2 by FDA under an Emergency Use Authorization (  EUA). This EUA will remain  in effect (meaning this test can be used) for the duration of the COVID-19 declaration under Section 564(b)(1) of the Act, 21 U.S.C.section 360bbb-3(b)(1), unless the authorization is terminated  or revoked sooner.       Influenza A by PCR NEGATIVE NEGATIVE Final   Influenza B by PCR NEGATIVE NEGATIVE Final    Comment: (NOTE) The Xpert Xpress SARS-CoV-2/FLU/RSV plus assay is intended as an aid in the diagnosis of influenza from Nasopharyngeal swab specimens and should not be used as a sole basis for treatment. Nasal washings and aspirates are unacceptable for Xpert Xpress SARS-CoV-2/FLU/RSV testing.  Fact Sheet for Patients: EntrepreneurPulse.com.au  Fact Sheet for Healthcare Providers: IncredibleEmployment.be  This test is not yet approved or cleared by the [place] FDA and has been authorized for detection and/or diagnosis of SARS-CoV-2 by FDA under an Emergency Use Authorization (EUA). This EUA will remain in effect (meaning this test can be used) for the duration of the COVID-19 declaration under Section 564(b)(1) of the Act, 21 U.S.C. section 360bbb-3(b)(1), unless the authorization is terminated or revoked.  Performed at [facility], [address]  [place], [place] [address]     Procedures and diagnostic studies:  CT Angio Chest Pulmonary Embolism (PE) W or WO Contrast  Result Date: 04/06/2021 CLINICAL DATA:  55 year old female with respiratory failure, concern for pulmonary embolism. EXAM: CT ANGIOGRAPHY CHEST WITH CONTRAST TECHNIQUE: Multidetector CT imaging of the chest was performed using the standard protocol during bolus administration of intravenous contrast. Multiplanar CT image reconstructions and MIPs were obtained to evaluate the vascular anatomy. CONTRAST:  Seventy mL Omnipaque 350, intravenous COMPARISON:  None. FINDINGS: Cardiovascular: Satisfactory opacification of the pulmonary arteries to the segmental level. No evidence of pulmonary embolism. Normal heart size. No pericardial effusion. Mediastinum/Nodes: No enlarged mediastinal, hilar, or axillary lymph nodes. Thyroid gland, trachea, and esophagus demonstrate no significant findings. Lungs/Pleura: No focal consolidations. No suspicious pulmonary nodules. No pleural effusion or pneumothorax. Upper Abdomen: Marked diffuse attenuation of the hepatic parenchyma. Status post cholecystectomy. The remaining visualized upper abdomen is within normal limits. Musculoskeletal: No chest wall abnormality. No acute or significant osseous findings. Review of the MIP images confirms the above findings. IMPRESSION: Vascular: No evidence of pulmonary embolism. Non-Vascular: 1. No acute intrathoracic abnormality. 2. Moderate hepatic steatosis. Ruthann Cancer, MD [facility] Electronically Signed   By: Ruthann Cancer M.D.   On: 04/06/2021 00:44   DG Chest Port 1 View  Result Date: 04/05/2021 CLINICAL DATA:  Chest pain. EXAM: PORTABLE CHEST 1 VIEW COMPARISON:  12/19/2019 FINDINGS: Upper normal heart size is likely accentuated by portable AP technique.The cardiomediastinal contours are normal. Atherosclerosis of the aortic arch. Pulmonary vasculature  is normal. No consolidation, pleural effusion, or pneumothorax. No acute osseous abnormalities are seen. IMPRESSION: No acute chest findings. Electronically Signed   By: Keith Rake M.D.   On: 04/05/2021 19:23               LOS: 0 days   Dara Camargo  [facility]   Pager on [email or web address]. If 7PM-7AM, please contact night-coverage at [email or web address]     04/06/2021, 10:37 AM

## 2021-04-06 NOTE — Consult Note (Addendum)
Cardiology Consultation:   Patient ID: [name] Ross MRN: [id number]; DOB: 05-Nov-1965  Admit date: 04/05/2021 Date of Consult: 04/06/2021  Primary Care Provider: Leanna Battles, MD [facility] Cardiologist: Kate Sable, MD  [facility] Electrophysiologist:  Vickie Epley, MD    Patient Profile:   [name] Ross is a 55 y.o. female with a hx of paroxysmal atrial fibrillation on flecainide followed by EP, GERD, OSA, morbid obesity who is being seen today for the evaluation of recurrent atrial fibrillation at the request of Jennye Boroughs, MD.  History of Present Illness:   [name] Ross  is a 55 y.o. female with a hx of paroxysmal atrial fibrillation on flecainide followed by EP, GERD, OSA, morbid obesity.  She was in her usual state of health until a few days ago when she was diagnosed with influenza and was started on Tamiflu.  She started noticing high heart rates on her blood pressure monitor at home.  She also was feeling weak.  She called her PCP and was instructed to go to the emergency room.  In the ER she was tachycardic with O2 sats 95% on room air.  Her magnesium was low at 1.6, potassium normal at 4.0, sodium 135 White cell count 11.2.  She was found to be in atrial flutter with RVR and started on IV Cardizem drip.  Chest CT was negative for PE.  At bedtime troponin normal at 4 and 3.  TSH normal at 3.374.  Lead EKG showed atrial flutter at 127 bpm with low voltage QRS and anterior infarct.  There were no acute ST changes.  Cardiology is now asked to consult.  Any chest pain or pressure, shortness of breath, PND, orthopnea, lower extremity edema, syncope.   Past Medical History:  Diagnosis Date   Anal skin tag    Atrial fibrillation ([place])    Depression    Diverticulosis    Diverticulosis, sigmoid    Dyslipidemia    GERD (gastroesophageal reflux disease)    Hemorrhoids    internal and external   History of diverticulitis    10/ 2016   History of galactorrhea     History of stress test    a. Pt reports h/o stress test and echo by cardiologist in [place] (no records in [place]).  Both reportedly nl.   Hypertension    IBS (irritable bowel syndrome)    Morbid obesity (HCC)    PONV (postoperative nausea and vomiting)    severe   Rash    elbows and behind knees   Restless leg syndrome    Sleep apnea    cpap sometimes   Wears glasses     Past Surgical History:  Procedure Laterality Date   BAND HEMORRHOIDECTOMY  2012   Dr Earlean Shawl   CESAREAN SECTION  1994   COLONOSCOPY  06/18/2011   CYSTO/  BILATERAL PYELOGRAM RETROGRADES/  TRANSOBTURATOR SLING  03/08/2006   EXCISION OF SKIN TAG N/A 02/13/2016   Procedure: ANAL SKIN TAG EXCISION;  Surgeon: [name], MD;  Location: [facility];  Service: General;  Laterality: N/A;   LAPAROSCOPIC CHOLECYSTECTOMY  05/22/2001   SHOULDER SURGERY       Home Medications:  Prior to Admission medications   Medication Sig Start Date End Date Taking? Authorizing Provider  acetaminophen (TYLENOL) 500 MG tablet Take 1,000 mg by mouth every 6 (six) hours as needed for mild pain.   Yes [provider]  buPROPion (WELLBUTRIN XL) 300 MG 24 hr tablet Take 300  mg by mouth every morning.    Yes [provider]  butalbital-acetaminophen-caffeine (FIORICET) 50-325-40 MG tablet Take by mouth 2 (two) times daily as needed for headache.   Yes [provider]  carvedilol (COREG) 25 MG tablet Take 1.5 tablets (37.5 mg total) by mouth 2 (two) times daily. 02/14/20  Yes Mickle Plumb, Jacquelyn D, PA-C  dicyclomine (BENTYL) 10 MG capsule Take one tablet by mouth once to twice daily as needed 07/05/19  Yes Armbruster, Carlota Raspberry, MD  diltiazem (CARDIZEM CD) 180 MG 24 hr capsule Take 1 capsule (180 mg total) by mouth in the morning and at bedtime. 11/21/20  Yes Agbor-Etang, Aaron Edelman, MD  ELIQUIS 5 MG TABS tablet TAKE 1 TABLET BY MOUTH TWICE DAILY 12/17/20  Yes Vickie Epley, MD  escitalopram (LEXAPRO) 20 MG tablet  Take 20 mg by mouth every morning.    Yes [provider]  estradiol (ESTRACE) 2 MG tablet Take 2 mg by mouth daily. 10/28/20  Yes [provider]  fenofibrate micronized (LOFIBRA) 200 MG capsule Take 200 mg by mouth daily before breakfast.  02/04/15  Yes [provider]  flecainide (TAMBOCOR) 50 MG tablet TAKE 1 TABLET BY MOUTH TWICE DAILY 03/19/21  Yes Vickie Epley, MD  fluticasone [facility]) 50 MCG/ACT nasal spray Place 1 spray into both nostrils daily.   Yes [provider]  ibuprofen (ADVIL) 200 MG tablet Take 600-800 mg by mouth every 6 (six) hours as needed for moderate pain.   Yes [provider]  linaclotide (LINZESS) 72 MCG capsule Take 72 mcg by mouth daily as needed. 10/31/15  Yes [provider]  loratadine (ALAVERT) 10 MG dissolvable tablet Take 1 tablet by mouth daily. 03/11/18  Yes [provider]  losartan (COZAAR) 100 MG tablet Take 100 mg by mouth every morning.  12/01/14  Yes [provider]  meclizine (ANTIVERT) 25 MG tablet Take 1 tablet (25 mg total) by mouth 3 (three) times daily as needed for dizziness. 03/08/19  Yes Lilia Pro., MD  meloxicam (MOBIC) 15 MG tablet Take 15 mg by mouth daily. 11/17/19  Yes [provider]  mometasone (ELOCON) 0.1 % ointment Apply topically 2 (two) times daily as needed. 06/11/20  Yes [provider]  omega-3 acid ethyl esters (LOVAZA) 1 g capsule Take 1 capsule (1 g total) by mouth 2 (two) times daily. 01/25/20  Yes Visser, Jacquelyn D, PA-C  omeprazole (PRILOSEC) 20 MG capsule Take 1 capsule (20 mg total) by mouth daily. 03/21/21  Yes Armbruster, Carlota Raspberry, MD  ondansetron (ZOFRAN-ODT) 4 MG disintegrating tablet Take 4 mg by mouth as directed. 04/02/21  Yes [provider]  oseltamivir (TAMIFLU) 75 MG capsule Take 75 mg by mouth 2 (two) times daily. 04/02/21  Yes [provider]  OZEMPIC, 2 MG/DOSE, 8 MG/3ML SOPN Inject 2 mg into the skin  once a week. 03/19/21  Yes [provider]  progesterone (PROMETRIUM) 200 MG capsule Take 200 mg by mouth at bedtime. 10/28/20  Yes [provider]  tacrolimus (PROTOPIC) 0.1 % ointment Apply 1 application topically 2 (two) times daily as needed. For psoriasis 01/08/21  Yes [provider]  triamcinolone (KENALOG) 0.1 % Apply 1 application topically 2 (two) times daily as needed. 05/15/20  Yes [provider]  triamterene-hydrochlorothiazide (MAXZIDE-25) 37.5-25 MG per tablet Take 0.5 tablets by mouth every morning.    Yes [provider]  valACYclovir (VALTREX) 1000 MG tablet Take 1,000 mg by mouth 2 (two) times daily  as needed.    Yes [provider]  zolpidem (AMBIEN) 10 MG tablet Take 10 mg by mouth at bedtime as needed for sleep.   Yes [provider]    Inpatient Medications: Scheduled Meds:  apixaban  5 mg Oral BID   buPROPion  300 mg Oral q morning   carvedilol  37.5 mg Oral BID WC   nicotine  14 mg Transdermal Daily   pantoprazole  40 mg Oral Daily   triamterene-hydrochlorothiazide  0.5 tablet Oral q morning   Continuous Infusions:  diltiazem (CARDIZEM) infusion 15 mg/hr (04/06/21 0130)   PRN Meds: acetaminophen **OR** acetaminophen, butalbital-acetaminophen-caffeine, hydrALAZINE, lip balm, mometasone, ondansetron **OR** ondansetron (ZOFRAN) IV, phenol  Allergies:    Allergies  Allergen Reactions   Venlafaxine     Other reaction(s): sleepy    Social History:   Social History   Socioeconomic History   Marital status: Married    Spouse name: Doctor, general practice   Number of children: 2   Years of education: RN   Highest education level: Not on file  Occupational History   Occupation: Therapist, sports  Tobacco Use   Smoking status: Never   Smokeless tobacco: Never  Vaping Use   Vaping Use: Never used  Substance and Sexual Activity   Alcohol use: No    Alcohol/week: 0.0 standard drinks   Drug use: No   Sexual activity: Not on file   Other Topics Concern   Not on file  Social History Narrative   Lives in [place].  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  Occasionally drinks coffee but not a heavy caffeine drinker.     Social Determinants of Health   Financial Resource Strain: Not on file  Food Insecurity: Not on file  Transportation Needs: Not on file  Physical Activity: Not on file  Stress: Not on file  Social Connections: Not on file  Intimate Partner Violence: Not on file    Family History:    Family History  Problem Relation Age of Onset   Diabetes Mother    Hypertension Mother    Atrial fibrillation Mother    Colon cancer Mother    Diabetes Father        committed suicide   Skin cancer Father    Suicidality Father    Hypertension Father    Diabetes Sister    Diabetes Sister    Breast cancer Other        materanl great aunt     ROS:  Please see the history of present illness.   All other ROS reviewed and negative.     Physical Exam/Data:   Vitals:   04/06/21 0200 04/06/21 0530 04/06/21 0600 04/06/21 0630  BP: 125/79 131/68 (!) 131/98 (!) 150/83  Pulse: (!) 101 (!) 102 61 (!) 56  Resp: 18 12  19   Temp:      TempSrc:      SpO2: 99% 99% 94% 93%  Weight:      Height:        Intake/Output Summary (Last 24 hours) at 04/06/2021 1024 Last data filed at 04/06/2021 0207 Gross per 24 hour  Intake 150 ml  Output --  Net 150 ml   Last 3 Weights 04/05/2021 03/21/2021 12/25/2020  Weight (lbs) 224 lb 13.9 oz 225 lb 3.2 oz 230 lb  Weight (kg) 102 kg 102.15 kg 104.327 kg     Body mass index is 35.75 kg/m.  General:  Well nourished, well developed, in no acute distress HEENT:  normal Lymph: no adenopathy Neck: no JVD Endocrine:  No thryomegaly Vascular: No carotid bruits; FA pulses 2+ bilaterally without bruits  Cardiac:  normal S1, S2; RRR; no murmur  Lungs:  clear to auscultation bilaterally, no wheezing, rhonchi or rales  Abd: soft, nontender, no hepatomegaly  Ext: no  edema Musculoskeletal:  No deformities, BUE and BLE strength normal and equal Skin: warm and dry  Neuro:  CNs 2-12 intact, no focal abnormalities noted Psych:  Normal affect   EKG:  The EKG was personally reviewed and demonstrates: Atrial flutter with rapid ventricular response, low voltage QRS and anterior infarct with no acute ST changes Telemetry:  Telemetry was personally reviewed and demonstrates: Atrial flutter with controlled ventricular response  Relevant CV Studies: 2D echo 11/2019 IMPRESSIONS    1. Left ventricular ejection fraction, by estimation, is 60 to 65%. The  left ventricle has normal function. The left ventricle has no regional  wall motion abnormalities. Left ventricular diastolic parameters were  normal.   2. Right ventricular systolic function is normal. The right ventricular  size is normal.   3. The mitral valve is normal in structure. Trivial mitral valve  regurgitation.   4. The aortic valve is normal in structure. Aortic valve regurgitation is  not visualized.   Laboratory Data:  High Sensitivity Troponin:   Recent Labs  Lab 04/05/21 1912 04/05/21 2155  TROPONINIHS 4 3     Chemistry Recent Labs  Lab 04/05/21 1912 04/06/21 0707  NA 135 137  K 4.0 3.5  CL 105 105  CO2 21* 21*  GLUCOSE 112* 119*  BUN 14 14  CREATININE 0.87 0.73  CALCIUM 9.0 9.1  GFRNONAA >60 >60  ANIONGAP 9 11    Recent Labs  Lab 04/05/21 1912  PROT 7.7  ALBUMIN 3.5  AST 22  ALT 14  ALKPHOS 32*  BILITOT 0.7   Hematology Recent Labs  Lab 04/05/21 1912 04/06/21 0707  WBC 11.2* 11.7*  RBC 4.15 4.23  HGB 12.3 12.4  HCT 36.1 36.4  MCV 87.0 86.1  MCH 29.6 29.3  MCHC 34.1 34.1  RDW 12.6 12.6  PLT 597* 582*   BNP Recent Labs  Lab 04/05/21 1912  BNP 92.2    DDimer No results for input(s): DDIMER in the last 168 hours.   Radiology/Studies:  CT Angio Chest Pulmonary Embolism (PE) W or WO Contrast  Result Date: 04/06/2021 CLINICAL DATA:  55 year old  female with respiratory failure, concern for pulmonary embolism. EXAM: CT ANGIOGRAPHY CHEST WITH CONTRAST TECHNIQUE: Multidetector CT imaging of the chest was performed using the standard protocol during bolus administration of intravenous contrast. Multiplanar CT image reconstructions and MIPs were obtained to evaluate the vascular anatomy. CONTRAST:  Seventy mL Omnipaque 350, intravenous COMPARISON:  None. FINDINGS: Cardiovascular: Satisfactory opacification of the pulmonary arteries to the segmental level. No evidence of pulmonary embolism. Normal heart size. No pericardial effusion. Mediastinum/Nodes: No enlarged mediastinal, hilar, or axillary lymph nodes. Thyroid gland, trachea, and esophagus demonstrate no significant findings. Lungs/Pleura: No focal consolidations. No suspicious pulmonary nodules. No pleural effusion or pneumothorax. Upper Abdomen: Marked diffuse attenuation of the hepatic parenchyma. Status post cholecystectomy. The remaining visualized upper abdomen is within normal limits. Musculoskeletal: No chest wall abnormality. No acute or significant osseous findings. Review of the MIP images confirms the above findings. IMPRESSION: Vascular: No evidence of pulmonary embolism. Non-Vascular: 1. No acute intrathoracic abnormality. 2. Moderate hepatic steatosis. Ruthann Cancer, MD [facility] Rad[facility] Electronically Signed   By: Camillia Herter  Suttle M.D.   On: 04/06/2021 00:44   DG Chest Port 1 View  Result Date: 04/05/2021 CLINICAL DATA:  Chest pain. EXAM: PORTABLE CHEST 1 VIEW COMPARISON:  12/19/2019 FINDINGS: Upper normal heart size is likely accentuated by portable AP technique.The cardiomediastinal contours are normal. Atherosclerosis of the aortic arch. Pulmonary vasculature is normal. No consolidation, pleural effusion, or pneumothorax. No acute osseous abnormalities are seen. IMPRESSION: No acute chest findings. Electronically Signed   By: Keith Rake M.D.   On: 04/05/2021 19:23     Assessment and Plan:   Atrial flutter with RVR -She has a known history of paroxysmal atrial fibrillation which has been controlled on flecainide 50 mg twice daily and carvedilol 37.5 mg twice daily. -Suspect recurrent atrial arrhythmias related to recent influenza infection as well as hypomagnesemia -Continue apixaban 5 mg twice daily, carvedilol 37.5 mg twice daily -Restart flecainide 50 mg twice daily which was held on admission -she has not missed any doses of Eliquis in the past 4 weeks except her dose last night that was not ordered on admission  I spoke with Dr. Rayann Heman with EP who feels it is ok to restart Flecainide 50mg  BID with only 1 missed dose of Eliquis last night and make NPO for possible DCCV in am.  -Encourage to use her CPAP nightly  2.  Hypertension -BP is controlled -Continue carvedilol 37.5 mg twice daily and restart triamterene HCT and losartan at home doses as BP tolerates -She is on Cardizem CD 180 mg daily at home this is currently on hold due to IV Cardizem drip  3.  Obstructive sleep apnea -Continue nightly use of CPAP to avoid breakthrough arrhythmias  4.  Hypomagnesemia -Replete per primary medical team to keep greater than 2 -Repeat labs today  5.  Influenza -Currently on Tamiflu -Per primary team  6.  Hypokalemia -Potassium was 4 on admission but down to 3.5 today -Potassium to keep greater than 4 -Bmet in am  For questions or updates, please contact [place] Please consult [email or web address] for contact info under    Signed, Fransico Him, MD  04/06/2021 10:24 AM

## 2021-04-07 ENCOUNTER — Inpatient Hospital Stay (HOSPITAL_COMMUNITY)
Admit: 2021-04-07 | Discharge: 2021-04-07 | Disposition: A | Discharge status: Home / Self Care | Payer: 59 | Attending: Internal Medicine | Admitting: Internal Medicine

## 2021-04-07 ENCOUNTER — Other Ambulatory Visit: Payer: Self-pay

## 2021-04-07 ENCOUNTER — Encounter: Payer: Self-pay | Admitting: Internal Medicine

## 2021-04-07 DIAGNOSIS — J069 Acute upper respiratory infection, unspecified: Secondary | ICD-10-CM

## 2021-04-07 DIAGNOSIS — I4891 Unspecified atrial fibrillation: Secondary | ICD-10-CM

## 2021-04-07 LAB — PROTIME-INR
INR: 1.1 (ref 0.8–1.2)
Prothrombin Time: 13.9 seconds (ref 11.4–15.2)

## 2021-04-07 LAB — ECHOCARDIOGRAM COMPLETE
AR max vel: 2.19 cm2
AV Area VTI: 2.47 cm2
AV Area mean vel: 1.75 cm2
AV Mean grad: 4 mmHg
AV Peak grad: 6 mmHg
Ao pk vel: 1.22 m/s
Area-P 1/2: 5.27 cm2
Height: 66.5 in
S' Lateral: 2.1 cm
Weight: 3597.91 oz

## 2021-04-07 LAB — BASIC METABOLIC PANEL
Anion gap: 8 (ref 5–15)
BUN: 22 mg/dL — ABNORMAL HIGH (ref 6–20)
CO2: 23 mmol/L (ref 22–32)
Calcium: 8.9 mg/dL (ref 8.9–10.3)
Chloride: 106 mmol/L (ref 98–111)
Creatinine, Ser: 0.86 mg/dL (ref 0.44–1.00)
GFR, Estimated: >60 mL/min (ref >60)
Glucose, Bld: 105 mg/dL — ABNORMAL HIGH (ref 70–99)
Potassium: 4.2 mmol/L (ref 3.5–5.1)
Sodium: 137 mmol/L (ref 135–145)

## 2021-04-07 MED ORDER — MENTHOL 3 MG MT LOZG
1.0000 | LOZENGE | OROMUCOSAL | Status: DC | PRN
  Filled 2021-04-07: qty 9

## 2021-04-07 MED ORDER — HYDROCODONE BIT-HOMATROP MBR 5-1.5 MG/5ML PO SOLN
5.0000 mL | Freq: Four times a day (QID) | ORAL | Status: DC | PRN
  Administered 2021-04-07 – 2021-04-08 (×5): 5 mL via ORAL
  Filled 2021-04-07 (×5): qty 5

## 2021-04-07 MED ORDER — SODIUM CHLORIDE 0.9 % IV SOLN: INTRAVENOUS | Status: DC

## 2021-04-07 NOTE — H&P (View-Only) (Signed)
Progress Note  Patient Name: [name] Ross Date of Encounter: 04/07/2021  [facility] Cardiologist: Kate Sable, MD    Subjective   Patient remains in Afib with elevated rates. She denies SOB or chest pain. Plan for cardioversion tomorrow.   Inpatient Medications    Scheduled Meds:  apixaban  5 mg Oral BID   buPROPion  300 mg Oral q morning   carvedilol  37.5 mg Oral BID WC   flecainide  50 mg Oral BID   nicotine  14 mg Transdermal Daily   pantoprazole  40 mg Oral Daily   traZODone  25 mg Oral QHS   triamterene-hydrochlorothiazide  0.5 tablet Oral q morning   Continuous Infusions:  diltiazem (CARDIZEM) infusion 15 mg/hr (04/06/21 2335)   PRN Meds: acetaminophen **OR** acetaminophen, butalbital-acetaminophen-caffeine, hydrALAZINE, HYDROcodone bit-homatropine, lip balm, mometasone, ondansetron **OR** ondansetron (ZOFRAN) IV, phenol   Vital Signs    Vitals:   04/07/21 0052 04/07/21 0417 04/07/21 0750 04/07/21 0819  BP: 110/77 100/83 96/75 117/76  Pulse: 100 62 64 (!) 126  Resp: 18 18 17    Temp: 97.7 F (36.5 C) 98 F (36.7 C) 98.7 F (37.1 C)   TempSrc:      SpO2: 98% 97% 98%   Weight:      Height:        Intake/Output Summary (Last 24 hours) at 04/07/2021 1042 Last data filed at 04/07/2021 0142 Gross per 24 hour  Intake 368.67 ml  Output --  Net 368.67 ml   Last 3 Weights 04/05/2021 03/21/2021 12/25/2020  Weight (lbs) 224 lb 13.9 oz 225 lb 3.2 oz 230 lb  Weight (kg) 102 kg 102.15 kg 104.327 kg      Telemetry    Afib, HR 90-120 - Personally Reviewed  ECG    No new - Personally Reviewed  Physical Exam   GEN: No acute distress.   Neck: No JVD Cardiac: Irreg Irreg, no murmurs, rubs, or gallops.  Respiratory: Clear to auscultation bilaterally. GI: Soft, nontender, non-distended  MS: No edema; No deformity. Neuro:  Nonfocal  Psych: Normal affect   Labs    High Sensitivity Troponin:   Recent Labs  Lab 04/05/21 1912 04/05/21 2155   TROPONINIHS 4 3     Chemistry Recent Labs  Lab 04/05/21 1912 04/06/21 0707 04/06/21 1344 04/07/21 0405  NA 135 137  --  137  K 4.0 3.5  --  4.2  CL 105 105  --  106  CO2 21* 21*  --  23  GLUCOSE 112* 119*  --  105*  BUN 14 14  --  22*  CREATININE 0.87 0.73  --  0.86  CALCIUM 9.0 9.1  --  8.9  MG 1.6*  --  2.0  --   PROT 7.7  --   --   --   ALBUMIN 3.5  --   --   --   AST 22  --   --   --   ALT 14  --   --   --   ALKPHOS 32*  --   --   --   BILITOT 0.7  --   --   --   GFRNONAA >60 >60  --  >60  ANIONGAP 9 11  --  8    Lipids No results for input(s): CHOL, TRIG, HDL, LABVLDL, LDLCALC, CHOLHDL in the last 168 hours.  Hematology Recent Labs  Lab 04/05/21 1912 04/06/21 0707  WBC 11.2* 11.7*  RBC 4.15 4.23  HGB  12.3 12.4  HCT 36.1 36.4  MCV 87.0 86.1  MCH 29.6 29.3  MCHC 34.1 34.1  RDW 12.6 12.6  PLT 597* 582*   Thyroid  Recent Labs  Lab 04/06/21 0707  TSH 3.374  FREET4 0.98    BNP Recent Labs  Lab 04/05/21 1912  BNP 92.2    DDimer No results for input(s): DDIMER in the last 168 hours.   Radiology    CT Angio Chest Pulmonary Embolism (PE) W or WO Contrast  Result Date: 04/06/2021 CLINICAL DATA:  55 year old female with respiratory failure, concern for pulmonary embolism. EXAM: CT ANGIOGRAPHY CHEST WITH CONTRAST TECHNIQUE: Multidetector CT imaging of the chest was performed using the standard protocol during bolus administration of intravenous contrast. Multiplanar CT image reconstructions and MIPs were obtained to evaluate the vascular anatomy. CONTRAST:  Seventy mL Omnipaque 350, intravenous COMPARISON:  None. FINDINGS: Cardiovascular: Satisfactory opacification of the pulmonary arteries to the segmental level. No evidence of pulmonary embolism. Normal heart size. No pericardial effusion. Mediastinum/Nodes: No enlarged mediastinal, hilar, or axillary lymph nodes. Thyroid gland, trachea, and esophagus demonstrate no significant findings. Lungs/Pleura: No  focal consolidations. No suspicious pulmonary nodules. No pleural effusion or pneumothorax. Upper Abdomen: Marked diffuse attenuation of the hepatic parenchyma. Status post cholecystectomy. The remaining visualized upper abdomen is within normal limits. Musculoskeletal: No chest wall abnormality. No acute or significant osseous findings. Review of the MIP images confirms the above findings. IMPRESSION: Vascular: No evidence of pulmonary embolism. Non-Vascular: 1. No acute intrathoracic abnormality. 2. Moderate hepatic steatosis. Ruthann Cancer, MD [facility] Radiology Electronically Signed   By: Ruthann Cancer M.D.   On: 04/06/2021 00:44   DG Chest Port 1 View  Result Date: 04/05/2021 CLINICAL DATA:  Chest pain. EXAM: PORTABLE CHEST 1 VIEW COMPARISON:  12/19/2019 FINDINGS: Upper normal heart size is likely accentuated by portable AP technique.The cardiomediastinal contours are normal. Atherosclerosis of the aortic arch. Pulmonary vasculature is normal. No consolidation, pleural effusion, or pneumothorax. No acute osseous abnormalities are seen. IMPRESSION: No acute chest findings. Electronically Signed   By: Keith Rake M.D.   On: 04/05/2021 19:23    Cardiac Studies   Echo pending  MPI 08/2020 Narrative & Impression  Blood pressure demonstrated a normal response to exercise. The patient exercised for 5 minutes and was not able to achieve 85% maximum predicted heart rate and thus switched to [place]. There was no ST segment deviation noted during stress. The study is normal. This is a low risk study. The left ventricular ejection fraction is normal (55-65%). CT attenuation images showed no evidence of aortic or coronary calcifications.    Long term monitor 07/2020 Patch Wear Time:  14 days and 0 hours (2022-02-08T16:08:22-0500 to 2022-02-22T16:08:26-0500)   Patient had a min HR of 53 bpm, max HR of 197 bpm, and avg HR of 83 bpm. Predominant  underlying rhythm was Sinus Rhythm. 148 Supraventricular Tachycardia runs occurred, the run with the fastest interval lasting 4 beats with a max rate of 197 bpm, the  longest lasting 13 beats with an avg rate of 127 bpm. Atrial Fibrillation occurred (<1% burden), ranging from 74-166 bpm (avg of 112 bpm), the longest lasting 28 mins 17 secs with an avg rate of 113 bpm. Supraventricular Tachycardia and Atrial  Fibrillation were detected within +/- 45 seconds of symptomatic patient event(s).  Echo 11/2019 1. Left ventricular ejection fraction, by estimation, is 60 to 65%. The  left ventricle has normal function. The left ventricle has no  regional  wall motion abnormalities. Left ventricular diastolic parameters were  normal.   2. Right ventricular systolic function is normal. The right ventricular  size is normal.   3. The mitral valve is normal in structure. Trivial mitral valve  regurgitation.   4. The aortic valve is normal in structure. Aortic valve regurgitation is  not visualized.   Patient Profile     55 y.o. female paroxysmal atrial fibrillation on flecainide followed by EP, GERD, OSA, morbid obesity who is being seen today for the evaluation of recurrent atrial fibrillation.  Assessment & Plan    Afib RVR - known h/o PAF on flecainide 50mg  BID and Coreg 37.5mg  BID - recurrent afib in the setting of the Flu and low magnesium - IV cardizem - Continue Eliquis 5mg  BID, - flecainide 50mg  Bid restarted - Patient missed 1 dose of Eliquis, night of admission, however this was discussed with EP, and they feel OK to pursue DCCV, plan for cardioversion tomorrow AM.   HTN - BP controlled - Continue coreg - IV dilt - continue Maxzide  OSA  - On CPAP  Hypomagnesemia - Mag 2.0 today  Flu - treatment per IM  Hypokalemia - K 4.2  For questions or updates, please contact [facility] Please consult [email or web address] for contact info under        Signed, Andri Prestia Ninfa Meeker, PA-C   04/07/2021, 10:42 AM

## 2021-04-07 NOTE — Progress Notes (Signed)
Progress Note  Patient Name: [name] Ross Date of Encounter: 04/07/2021  [facility] Cardiologist: Kate Sable, MD    Subjective   Patient remains in Afib with elevated rates. She denies SOB or chest pain. Plan for cardioversion tomorrow.   Inpatient Medications    Scheduled Meds:  apixaban  5 mg Oral BID   buPROPion  300 mg Oral q morning   carvedilol  37.5 mg Oral BID WC   flecainide  50 mg Oral BID   nicotine  14 mg Transdermal Daily   pantoprazole  40 mg Oral Daily   traZODone  25 mg Oral QHS   triamterene-hydrochlorothiazide  0.5 tablet Oral q morning   Continuous Infusions:  diltiazem (CARDIZEM) infusion 15 mg/hr (04/06/21 2335)   PRN Meds: acetaminophen **OR** acetaminophen, butalbital-acetaminophen-caffeine, hydrALAZINE, HYDROcodone bit-homatropine, lip balm, mometasone, ondansetron **OR** ondansetron (ZOFRAN) IV, phenol   Vital Signs    Vitals:   04/07/21 0052 04/07/21 0417 04/07/21 0750 04/07/21 0819  BP: 110/77 100/83 96/75 117/76  Pulse: 100 62 64 (!) 126  Resp: 18 18 17    Temp: 97.7 F (36.5 C) 98 F (36.7 C) 98.7 F (37.1 C)   TempSrc:      SpO2: 98% 97% 98%   Weight:      Height:        Intake/Output Summary (Last 24 hours) at 04/07/2021 1042 Last data filed at 04/07/2021 0142 Gross per 24 hour  Intake 368.67 ml  Output --  Net 368.67 ml   Last 3 Weights 04/05/2021 03/21/2021 12/25/2020  Weight (lbs) 224 lb 13.9 oz 225 lb 3.2 oz 230 lb  Weight (kg) 102 kg 102.15 kg 104.327 kg      Telemetry    Afib, HR 90-120 - Personally Reviewed  ECG    No new - Personally Reviewed  Physical Exam   GEN: No acute distress.   Neck: No JVD Cardiac: Irreg Irreg, no murmurs, rubs, or gallops.  Respiratory: Clear to auscultation bilaterally. GI: Soft, nontender, non-distended  MS: No edema; No deformity. Neuro:  Nonfocal  Psych: Normal affect   Labs    High Sensitivity Troponin:   Recent Labs  Lab 04/05/21 1912 04/05/21 2155   TROPONINIHS 4 3     Chemistry Recent Labs  Lab 04/05/21 1912 04/06/21 0707 04/06/21 1344 04/07/21 0405  NA 135 137  --  137  K 4.0 3.5  --  4.2  CL 105 105  --  106  CO2 21* 21*  --  23  GLUCOSE 112* 119*  --  105*  BUN 14 14  --  22*  CREATININE 0.87 0.73  --  0.86  CALCIUM 9.0 9.1  --  8.9  MG 1.6*  --  2.0  --   PROT 7.7  --   --   --   ALBUMIN 3.5  --   --   --   AST 22  --   --   --   ALT 14  --   --   --   ALKPHOS 32*  --   --   --   BILITOT 0.7  --   --   --   GFRNONAA >60 >60  --  >60  ANIONGAP 9 11  --  8    Lipids No results for input(s): CHOL, TRIG, HDL, LABVLDL, LDLCALC, CHOLHDL in the last 168 hours.  Hematology Recent Labs  Lab 04/05/21 1912 04/06/21 0707  WBC 11.2* 11.7*  RBC 4.15 4.23  HGB  12.3 12.4  HCT 36.1 36.4  MCV 87.0 86.1  MCH 29.6 29.3  MCHC 34.1 34.1  RDW 12.6 12.6  PLT 597* 582*   Thyroid  Recent Labs  Lab 04/06/21 0707  TSH 3.374  FREET4 0.98    BNP Recent Labs  Lab 04/05/21 1912  BNP 92.2    DDimer No results for input(s): DDIMER in the last 168 hours.   Radiology    CT Angio Chest Pulmonary Embolism (PE) W or WO Contrast  Result Date: 04/06/2021 CLINICAL DATA:  55 year old female with respiratory failure, concern for pulmonary embolism. EXAM: CT ANGIOGRAPHY CHEST WITH CONTRAST TECHNIQUE: Multidetector CT imaging of the chest was performed using the standard protocol during bolus administration of intravenous contrast. Multiplanar CT image reconstructions and MIPs were obtained to evaluate the vascular anatomy. CONTRAST:  Seventy mL Omnipaque 350, intravenous COMPARISON:  None. FINDINGS: Cardiovascular: Satisfactory opacification of the pulmonary arteries to the segmental level. No evidence of pulmonary embolism. Normal heart size. No pericardial effusion. Mediastinum/Nodes: No enlarged mediastinal, hilar, or axillary lymph nodes. Thyroid gland, trachea, and esophagus demonstrate no significant findings. Lungs/Pleura: No  focal consolidations. No suspicious pulmonary nodules. No pleural effusion or pneumothorax. Upper Abdomen: Marked diffuse attenuation of the hepatic parenchyma. Status post cholecystectomy. The remaining visualized upper abdomen is within normal limits. Musculoskeletal: No chest wall abnormality. No acute or significant osseous findings. Review of the MIP images confirms the above findings. IMPRESSION: Vascular: No evidence of pulmonary embolism. Non-Vascular: 1. No acute intrathoracic abnormality. 2. Moderate hepatic steatosis. Ruthann Cancer, MD [facility] Radiology Electronically Signed   By: Ruthann Cancer M.D.   On: 04/06/2021 00:44   DG Chest Port 1 View  Result Date: 04/05/2021 CLINICAL DATA:  Chest pain. EXAM: PORTABLE CHEST 1 VIEW COMPARISON:  12/19/2019 FINDINGS: Upper normal heart size is likely accentuated by portable AP technique.The cardiomediastinal contours are normal. Atherosclerosis of the aortic arch. Pulmonary vasculature is normal. No consolidation, pleural effusion, or pneumothorax. No acute osseous abnormalities are seen. IMPRESSION: No acute chest findings. Electronically Signed   By: Keith Rake M.D.   On: 04/05/2021 19:23    Cardiac Studies   Echo pending  MPI 08/2020 Narrative & Impression  Blood pressure demonstrated a normal response to exercise. The patient exercised for 5 minutes and was not able to achieve 85% maximum predicted heart rate and thus switched to [place]. There was no ST segment deviation noted during stress. The study is normal. This is a low risk study. The left ventricular ejection fraction is normal (55-65%). CT attenuation images showed no evidence of aortic or coronary calcifications.    Long term monitor 07/2020 Patch Wear Time:  14 days and 0 hours (2022-02-08T16:08:22-0500 to 2022-02-22T16:08:26-0500)   Patient had a min HR of 53 bpm, max HR of 197 bpm, and avg HR of 83 bpm. Predominant  underlying rhythm was Sinus Rhythm. 148 Supraventricular Tachycardia runs occurred, the run with the fastest interval lasting 4 beats with a max rate of 197 bpm, the  longest lasting 13 beats with an avg rate of 127 bpm. Atrial Fibrillation occurred (<1% burden), ranging from 74-166 bpm (avg of 112 bpm), the longest lasting 28 mins 17 secs with an avg rate of 113 bpm. Supraventricular Tachycardia and Atrial  Fibrillation were detected within +/- 45 seconds of symptomatic patient event(s).  Echo 11/2019 1. Left ventricular ejection fraction, by estimation, is 60 to 65%. The  left ventricle has normal function. The left ventricle has no  regional  wall motion abnormalities. Left ventricular diastolic parameters were  normal.   2. Right ventricular systolic function is normal. The right ventricular  size is normal.   3. The mitral valve is normal in structure. Trivial mitral valve  regurgitation.   4. The aortic valve is normal in structure. Aortic valve regurgitation is  not visualized.   Patient Profile     55 y.o. female paroxysmal atrial fibrillation on flecainide followed by EP, GERD, OSA, morbid obesity who is being seen today for the evaluation of recurrent atrial fibrillation.  Assessment & Plan    Afib RVR - known h/o PAF on flecainide 50mg  BID and Coreg 37.5mg  BID - recurrent afib in the setting of the Flu and low magnesium - IV cardizem - Continue Eliquis 5mg  BID, - flecainide 50mg  Bid restarted - Patient missed 1 dose of Eliquis, night of admission, however this was discussed with EP, and they feel OK to pursue DCCV, plan for cardioversion tomorrow AM.   HTN - BP controlled - Continue coreg - IV dilt - continue Maxzide  OSA  - On CPAP  Hypomagnesemia - Mag 2.0 today  Flu - treatment per IM  Hypokalemia - K 4.2  For questions or updates, please contact [facility] Please consult [email or web address] for contact info under        Signed, Cambria Osten Ninfa Meeker, PA-C   04/07/2021, 10:42 AM

## 2021-04-07 NOTE — Progress Notes (Addendum)
Progress Note    DONNEISHA BEANE  RJJ:[id number] DOB: 02/27/1966  DOA: 04/05/2021 PCP: Leanna Battles, MD      Brief Narrative:    Medical records reviewed and are as summarized below:  [name] is a 55 y.o. female with medical history significant for paroxysmal atrial fibrillation, GERD, OSA, morbid obesity, hypertension, who presented to the hospital because of elevated heart rate.  She said she was started on Tamiflu few days prior to admission for influenza A infection.      Assessment/Plan:   Principal Problem:   Atrial fibrillation with rapid ventricular response (HCC) Active Problems:   OSA (obstructive sleep apnea)   Essential hypertension   Hypomagnesemia   URI (upper respiratory infection)   Body mass index is 35.75 kg/m.  (Morbid obesity)  Atrial fibrillation/atrial flutter with RVR: Continue IV Cardizem infusion, flecainide, carvedilol and Eliquis.  Plan for cardioversion tomorrow.  Follow-up with cardiologist.  Hypomagnesemia: Improved  Hypertension: Continue antihypertensives  Upper respiratory tract infection, persistent cough: Influenza test was negative.  Hycodan syrup as needed for cough  Urine drug screen was positive for ecstasy.  However, patient said she has never used any ecstasy or illicit drugs. This may be a false positive test   Diet Order             Diet NPO time specified  Diet effective midnight           Diet regular Room service appropriate? Yes; Fluid consistency: Thin  Diet effective now                      Consultants: Cardiologist  Procedures: None    Medications:    apixaban  5 mg Oral BID   buPROPion  300 mg Oral q morning   carvedilol  37.5 mg Oral BID WC   flecainide  50 mg Oral BID   nicotine  14 mg Transdermal Daily   pantoprazole  40 mg Oral Daily   traZODone  25 mg Oral QHS   triamterene-hydrochlorothiazide  0.5 tablet Oral q morning   Continuous Infusions:  diltiazem (CARDIZEM)  infusion 15 mg/hr (04/06/21 2335)     Anti-infectives (From admission, onward)    Start     Dose/Rate Route Frequency Ordered Stop   04/05/21 2345  Ampicillin-Sulbactam (UNASYN) 3 g in sodium chloride 0.9 % 100 mL IVPB  Status:  Discontinued        3 g 200 mL/hr over 30 Minutes Intravenous Every 8 hours 04/05/21 2335 04/06/21 0830              Family Communication/Anticipated D/C date and plan/Code Status   DVT prophylaxis:  apixaban (ELIQUIS) tablet 5 mg     Code Status: Full Code  Family Communication: None Disposition Plan: Possible discharge to home in 1 to 2 days   Status is: Inpatient  Remains inpatient appropriate because: IV Cardizem, plan for cardioversion tomorrow           Subjective:   C/o cough that is worse at night.  No chest pain, palpitations, shortness of breath or dizziness.  Objective:    Vitals:   04/07/21 0417 04/07/21 0750 04/07/21 0819 04/07/21 1122  BP: 100/83 96/75 117/76 102/75  Pulse: 62 64 (!) 126 60  Resp: 18 17  17   Temp: 98 F (36.7 C) 98.7 F (37.1 C)  98.7 F (37.1 C)  TempSrc:      SpO2: 97% 98%  96%  Weight:      Height:       No data found.   Intake/Output Summary (Last 24 hours) at 04/07/2021 1453 Last data filed at 04/07/2021 1345 Gross per 24 hour  Intake 848.67 ml  Output --  Net 848.67 ml   Filed Weights   04/05/21 1850  Weight: 102 kg    Exam:  GEN: NAD SKIN: No rash on exposed skin EYES: No pallor or icterus ENT: MMM CV: Irregular rate and rhythm, tachycardic PULM: CTA B ABD: soft, obese, NT, +BS CNS: AAO x 3, non focal EXT: No edema or tenderness        Data Reviewed:   I have personally reviewed following labs and imaging studies:  Labs: Labs show the following:   Basic Metabolic Panel: Recent Labs  Lab 04/05/21 1912 04/06/21 0707 04/06/21 1344 04/07/21 0405  NA 135 137  --  137  K 4.0 3.5  --  4.2  CL 105 105  --  106  CO2 21* 21*  --  23  GLUCOSE 112*  119*  --  105*  BUN 14 14  --  22*  CREATININE 0.87 0.73  --  0.86  CALCIUM 9.0 9.1  --  8.9  MG 1.6*  --  2.0  --    GFR Estimated Creatinine Clearance: 90 mL/min (by C-G formula based on SCr of 0.86 mg/dL). Liver Function Tests: Recent Labs  Lab 04/05/21 1912  AST 22  ALT 14  ALKPHOS 32*  BILITOT 0.7  PROT 7.7  ALBUMIN 3.5   No results for input(s): LIPASE, AMYLASE in the last 168 hours. No results for input(s): AMMONIA in the last 168 hours. Coagulation profile No results for input(s): INR, PROTIME in the last 168 hours.  CBC: Recent Labs  Lab 04/05/21 1912 04/06/21 0707  WBC 11.2* 11.7*  NEUTROABS 7.7  --   HGB 12.3 12.4  HCT 36.1 36.4  MCV 87.0 86.1  PLT 597* 582*   Cardiac Enzymes: No results for input(s): CKTOTAL, CKMB, CKMBINDEX, TROPONINI in the last 168 hours. BNP (last 3 results) No results for input(s): PROBNP in the last 8760 hours. CBG: No results for input(s): GLUCAP in the last 168 hours. D-Dimer: No results for input(s): DDIMER in the last 72 hours. Hgb A1c: Recent Labs    04/06/21 0707  HGBA1C 5.9*   Lipid Profile: No results for input(s): CHOL, HDL, LDLCALC, TRIG, CHOLHDL, LDLDIRECT in the last 72 hours. Thyroid function studies: Recent Labs    04/06/21 0707  TSH 3.374   Anemia work up: No results for input(s): VITAMINB12, FOLATE, FERRITIN, TIBC, IRON, RETICCTPCT in the last 72 hours. Sepsis Labs: Recent Labs  Lab 04/05/21 1912 04/06/21 0707  WBC 11.2* 11.7*    Microbiology Recent Results (from the past 240 hour(s))  Resp Panel by RT-PCR (Flu A&B, Covid) Nasopharyngeal Swab     Status: None   Collection Time: 04/05/21  7:12 PM   Specimen: Nasopharyngeal Swab; Nasopharyngeal(NP) swabs in vial transport medium  Result Value Ref Range Status   SARS Coronavirus 2 by RT PCR NEGATIVE NEGATIVE Final    Comment: (NOTE) SARS-CoV-2 target nucleic acids are NOT DETECTED.  The SARS-CoV-2 RNA is generally detectable in upper  respiratory specimens during the acute phase of infection. The lowest concentration of SARS-CoV-2 viral copies this assay can detect is 138 copies/mL. A negative result does not preclude SARS-Cov-2 infection and should not be used as the sole basis for treatment or other patient management decisions.  A negative result may occur with  improper specimen collection/handling, submission of specimen other than nasopharyngeal swab, presence of viral mutation(s) within the areas targeted by this assay, and inadequate number of viral copies(<138 copies/mL). A negative result must be combined with clinical observations, patient history, and epidemiological information. The expected result is Negative.  Fact Sheet for Patients:  EntrepreneurPulse.com.au  Fact Sheet for Healthcare Providers:  IncredibleEmployment.be  This test is no t yet approved or cleared by the [place] FDA and  has been authorized for detection and/or diagnosis of SARS-CoV-2 by FDA under an Emergency Use Authorization (EUA). This EUA will remain  in effect (meaning this test can be used) for the duration of the COVID-19 declaration under Section 564(b)(1) of the Act, 21 U.S.C.section 360bbb-3(b)(1), unless the authorization is terminated  or revoked sooner.       Influenza A by PCR NEGATIVE NEGATIVE Final   Influenza B by PCR NEGATIVE NEGATIVE Final    Comment: (NOTE) The Xpert Xpress SARS-CoV-2/FLU/RSV plus assay is intended as an aid in the diagnosis of influenza from Nasopharyngeal swab specimens and should not be used as a sole basis for treatment. Nasal washings and aspirates are unacceptable for Xpert Xpress SARS-CoV-2/FLU/RSV testing.  Fact Sheet for Patients: EntrepreneurPulse.com.au  Fact Sheet for Healthcare Providers: IncredibleEmployment.be  This test is not yet approved or cleared by the [place] FDA and has been  authorized for detection and/or diagnosis of SARS-CoV-2 by FDA under an Emergency Use Authorization (EUA). This EUA will remain in effect (meaning this test can be used) for the duration of the COVID-19 declaration under Section 564(b)(1) of the Act, 21 U.S.C. section 360bbb-3(b)(1), unless the authorization is terminated or revoked.  Performed at [facility], [address] [place], [place] [address]     Procedures and diagnostic studies:  CT Angio Chest Pulmonary Embolism (PE) W or WO Contrast  Result Date: 04/06/2021 CLINICAL DATA:  55 year old female with respiratory failure, concern for pulmonary embolism. EXAM: CT ANGIOGRAPHY CHEST WITH CONTRAST TECHNIQUE: Multidetector CT imaging of the chest was performed using the standard protocol during bolus administration of intravenous contrast. Multiplanar CT image reconstructions and MIPs were obtained to evaluate the vascular anatomy. CONTRAST:  Seventy mL Omnipaque 350, intravenous COMPARISON:  None. FINDINGS: Cardiovascular: Satisfactory opacification of the pulmonary arteries to the segmental level. No evidence of pulmonary embolism. Normal heart size. No pericardial effusion. Mediastinum/Nodes: No enlarged mediastinal, hilar, or axillary lymph nodes. Thyroid gland, trachea, and esophagus demonstrate no significant findings. Lungs/Pleura: No focal consolidations. No suspicious pulmonary nodules. No pleural effusion or pneumothorax. Upper Abdomen: Marked diffuse attenuation of the hepatic parenchyma. Status post cholecystectomy. The remaining visualized upper abdomen is within normal limits. Musculoskeletal: No chest wall abnormality. No acute or significant osseous findings. Review of the MIP images confirms the above findings. IMPRESSION: Vascular: No evidence of pulmonary embolism. Non-Vascular: 1. No acute intrathoracic abnormality. 2. Moderate hepatic steatosis. Ruthann Cancer, MD [facility]  [facility] Radiology Electronically Signed   By: Ruthann Cancer M.D.   On: 04/06/2021 00:44   DG Chest Port 1 View  Result Date: 04/05/2021 CLINICAL DATA:  Chest pain. EXAM: PORTABLE CHEST 1 VIEW COMPARISON:  12/19/2019 FINDINGS: Upper normal heart size is likely accentuated by portable AP technique.The cardiomediastinal contours are normal. Atherosclerosis of the aortic arch. Pulmonary vasculature is normal. No consolidation, pleural effusion, or pneumothorax. No acute osseous abnormalities are seen. IMPRESSION: No acute chest findings. Electronically Signed   By: Aurther Loft.D.  On: 04/05/2021 19:23   ECHOCARDIOGRAM COMPLETE  Result Date: 04/07/2021    ECHOCARDIOGRAM REPORT   Patient Name:   NEOLA WORRALL [place] Date of Exam: 04/07/2021 Medical Rec #:  [id number]     Height:       66.5 in Accession #:    [id number]    Weight:       224.9 lb Date of Birth:  03-Oct-1965      BSA:          2.114 m Patient Age:    41 years      BP:           100/83 mmHg Patient Gender: F             HR:           62 bpm. Exam Location:  [facility] Procedure: 2D Echo, Cardiac Doppler and Color Doppler Indications:     Atrial Fibrillation I48.91  History:         Patient has prior history of Echocardiogram examinations, most                  recent 12/20/2019. COPD, Arrythmias:Atrial Fibrillation; Risk                  Factors:Hypertension and Sleep Apnea.  Sonographer:     Sherrie Sport Referring Phys:  [place] Diagnosing Phys: Kathlyn Sacramento MD IMPRESSIONS  1. Left ventricular ejection fraction, by estimation, is 60 to 65%. The left ventricle has normal function. The left ventricle has no regional wall motion abnormalities. There is mild asymmetric left ventricular hypertrophy of the basal-septal segment. Left ventricular diastolic parameters are indeterminate.  2. Right ventricular systolic function is normal. The right ventricular size is normal. Tricuspid regurgitation signal is inadequate for assessing PA pressure.  3.  The mitral valve is normal in structure. Trivial mitral valve regurgitation. No evidence of mitral stenosis.  4. The aortic valve is normal in structure. Aortic valve regurgitation is not visualized. No aortic stenosis is present. FINDINGS  Left Ventricle: Left ventricular ejection fraction, by estimation, is 60 to 65%. The left ventricle has normal function. The left ventricle has no regional wall motion abnormalities. The left ventricular internal cavity size was normal in size. There is  mild asymmetric left ventricular hypertrophy of the basal-septal segment. Left ventricular diastolic parameters are indeterminate. Right Ventricle: The right ventricular size is normal. No increase in right ventricular wall thickness. Right ventricular systolic function is normal. Tricuspid regurgitation signal is inadequate for assessing PA pressure. Left Atrium: Left atrial size was normal in size. Right Atrium: Right atrial size was normal in size. Pericardium: There is no evidence of pericardial effusion. Mitral Valve: The mitral valve is normal in structure. Trivial mitral valve regurgitation. No evidence of mitral valve stenosis. Tricuspid Valve: The tricuspid valve is normal in structure. Tricuspid valve regurgitation is not demonstrated. No evidence of tricuspid stenosis. Aortic Valve: The aortic valve is normal in structure. Aortic valve regurgitation is not visualized. No aortic stenosis is present. Aortic valve mean gradient measures 4.0 mmHg. Aortic valve peak gradient measures 6.0 mmHg. Aortic valve area, by VTI measures 2.47 cm. Pulmonic Valve: The pulmonic valve was normal in structure. Pulmonic valve regurgitation is not visualized. No evidence of pulmonic stenosis. Aorta: The aortic root is normal in size and structure. Venous: The inferior vena cava was not well visualized. IAS/Shunts: No atrial level shunt detected by color flow Doppler.  LEFT VENTRICLE PLAX 2D LVIDd:  3.50 cm LVIDs:         2.10 cm LV  PW:         1.00 cm LV IVS:        0.95 cm LVOT diam:     2.10 cm LV SV:         42 LV SV Index:   20 LVOT Area:     3.46 cm  RIGHT VENTRICLE RV Basal diam:  3.20 cm RV S prime:     15.60 cm/s TAPSE (M-mode): 4.0 cm LEFT ATRIUM             Index        RIGHT ATRIUM           Index LA diam:        3.70 cm 1.75 cm/m   RA Area:     14.60 cm LA Vol (A2C):   31.4 ml 14.86 ml/m  RA Volume:   28.70 ml  13.58 ml/m LA Vol (A4C):   36.7 ml 17.36 ml/m LA Biplane Vol: 36.3 ml 17.17 ml/m  AORTIC VALVE                    PULMONIC VALVE AV Area (Vmax):    2.19 cm     PV Vmax:        0.80 m/s AV Area (Vmean):   1.75 cm     PV Vmean:       59.400 cm/s AV Area (VTI):     2.47 cm     PV VTI:         0.138 m AV Vmax:           122.00 cm/s  PV Peak grad:   2.6 mmHg AV Vmean:          87.500 cm/s  PV Mean grad:   1.5 mmHg AV VTI:            0.170 m      RVOT Peak grad: 4 mmHg AV Peak Grad:      6.0 mmHg AV Mean Grad:      4.0 mmHg LVOT Vmax:         77.00 cm/s LVOT Vmean:        44.200 cm/s LVOT VTI:          0.121 m LVOT/AV VTI ratio: 0.71  AORTA Ao Root diam: 3.05 cm MITRAL VALVE                TRICUSPID VALVE MV Area (PHT): 5.27 cm     TR Peak grad:   20.8 mmHg MV Decel Time: 144 msec     TR Vmax:        228.00 cm/s MV E velocity: 126.00 cm/s                             SHUNTS                             Systemic VTI:  0.12 m                             Systemic Diam: 2.10 cm                             Pulmonic VTI:  0.160 m  Kathlyn Sacramento MD Electronically signed by Kathlyn Sacramento MD Signature Date/Time: 04/07/2021/11:21:36 AM    Final                LOS: 1 day   Jennye Boroughs  [facility]   Pager on [email or web address]. If 7PM-7AM, please contact night-coverage at [email or web address]     04/07/2021, 2:53 PM

## 2021-04-07 NOTE — Progress Notes (Signed)
*  PRELIMINARY RESULTS* Echocardiogram 2D Echocardiogram has been performed.  Sherrie Sport 04/07/2021, 8:24 AM

## 2021-04-07 NOTE — Telephone Encounter (Signed)
Patient is currently admitted to [facility]. Will await d/c plan.

## 2021-04-08 ENCOUNTER — Encounter: Payer: Self-pay | Admitting: Internal Medicine

## 2021-04-08 ENCOUNTER — Inpatient Hospital Stay: Payer: 59 | Admitting: Anesthesiology

## 2021-04-08 ENCOUNTER — Encounter
Admission: EM | Disposition: A | Discharge status: Home / Self Care | Payer: Self-pay | Source: Home / Self Care | Attending: Internal Medicine

## 2021-04-08 HISTORY — PX: CARDIOVERSION: SHX1299

## 2021-04-08 LAB — URINE DRUG SCREEN, QUALITATIVE (ARMC ONLY)
Amphetamines, Ur Screen: NOT DETECTED
Barbiturates, Ur Screen: POSITIVE — AB
Benzodiazepine, Ur Scrn: NOT DETECTED
Cannabinoid 50 Ng, Ur ~~LOC~~: NOT DETECTED
Cocaine Metabolite,Ur ~~LOC~~: NOT DETECTED
MDMA (Ecstasy)Ur Screen: NOT DETECTED
Methadone Scn, Ur: NOT DETECTED
Opiate, Ur Screen: POSITIVE — AB
Phencyclidine (PCP) Ur S: NOT DETECTED
Tricyclic, Ur Screen: NOT DETECTED

## 2021-04-08 SURGERY — CARDIOVERSION
Anesthesia: General

## 2021-04-08 MED ORDER — HYDROCODONE BIT-HOMATROP MBR 5-1.5 MG/5ML PO SOLN: 5.0000 mL | Freq: Four times a day (QID) | ORAL | 0 refills | Status: DC | PRN

## 2021-04-08 MED ORDER — ALUM & MAG HYDROXIDE-SIMETH 200-200-20 MG/5ML PO SUSP
15.0000 mL | Freq: Four times a day (QID) | ORAL | Status: DC | PRN
  Filled 2021-04-08: qty 30

## 2021-04-08 MED ORDER — PROPOFOL 10 MG/ML IV BOLUS
INTRAVENOUS | Status: DC | PRN
  Administered 2021-04-08: 70 mg via INTRAVENOUS

## 2021-04-08 NOTE — CV Procedure (Signed)
    Cardioversion Note  [name] Ross [id number] [birth year]  Procedure: DC Cardioversion Indications: Atrial fibrillation with rapid ventricular response  Procedure Details Consent: Obtained Time Out: Verified patient identification, verified procedure, site/side was marked, verified correct patient position, special equipment/implants available, Radiology Safety Procedures followed,  medications/allergies/relevent history reviewed, required imaging and test results available.  Performed  The patient has been on adequate anticoagulation.  The patient received IV propofol for sedation.  Synchronous cardioversion was performed at 200 joules x 1.  The cardioversion was successful with restoration of sinus rhythm.  Complications: No apparent complications Patient did tolerate procedure well.  Nelva Bush, MD 04/08/2021, 8:34 AM

## 2021-04-08 NOTE — Progress Notes (Signed)
Progress Note  Patient Name: [name] Ross Date of Encounter: 04/08/2021  [facility] Cardiologist: Kate Sable, MD   Subjective   No chest pain, shortness of breath, or palpitations.  Patient status post successful cardioversion today with restoration of sinus rhythm.  Inpatient Medications    Scheduled Meds:  [MAR Hold] apixaban  5 mg Oral BID   [MAR Hold] buPROPion  300 mg Oral q morning   [MAR Hold] carvedilol  37.5 mg Oral BID WC   [MAR Hold] flecainide  50 mg Oral BID   [MAR Hold] nicotine  14 mg Transdermal Daily   [MAR Hold] pantoprazole  40 mg Oral Daily   [MAR Hold] traZODone  25 mg Oral QHS   [MAR Hold] triamterene-hydrochlorothiazide  0.5 tablet Oral q morning   Continuous Infusions:  PRN Meds: [MAR Hold] acetaminophen **OR** [MAR Hold] acetaminophen, [MAR Hold] butalbital-acetaminophen-caffeine, [MAR Hold] hydrALAZINE, [MAR Hold] HYDROcodone bit-homatropine, [MAR Hold] lip balm, [MAR Hold] menthol-cetylpyridinium, [MAR Hold] mometasone, [MAR Hold] ondansetron **OR** [MAR Hold] ondansetron (ZOFRAN) IV, [MAR Hold] phenol   Vital Signs    Vitals:   04/08/21 0827 04/08/21 0828 04/08/21 0829 04/08/21 0832  BP:  125/88  103/76  Pulse: (!) 134 80 83 81  Resp: 15 16  14   Temp:      TempSrc:      SpO2: 93% 96% 100% 96%  Weight:      Height:        Intake/Output Summary (Last 24 hours) at 04/08/2021 0844 Last data filed at 04/07/2021 1830 Gross per 24 hour  Intake 1440 ml  Output --  Net 1440 ml   Last 3 Weights 04/08/2021 04/05/2021 03/21/2021  Weight (lbs) 224 lb 13.9 oz 224 lb 13.9 oz 225 lb 3.2 oz  Weight (kg) 102 kg 102 kg 102.15 kg      Telemetry    Atrial fibrillation, now sinus rhythm status post cardioversion - Personally Reviewed  ECG    Post cardioversion: Normal sinus rhythm with low voltage - Personally Reviewed  Physical Exam   GEN: No acute distress.   Neck: No JVD Cardiac: RRR, no murmurs, rubs, or gallops.   Respiratory: Clear to auscultation bilaterally. GI: Soft, nontender, non-distended  MS: No edema; No deformity. Neuro:  Nonfocal  Psych: Normal affect   Labs    High Sensitivity Troponin:   Recent Labs  Lab 04/05/21 1912 04/05/21 2155  TROPONINIHS 4 3     Chemistry Recent Labs  Lab 04/05/21 1912 04/06/21 0707 04/06/21 1344 04/07/21 0405  NA 135 137  --  137  K 4.0 3.5  --  4.2  CL 105 105  --  106  CO2 21* 21*  --  23  GLUCOSE 112* 119*  --  105*  BUN 14 14  --  22*  CREATININE 0.87 0.73  --  0.86  CALCIUM 9.0 9.1  --  8.9  MG 1.6*  --  2.0  --   PROT 7.7  --   --   --   ALBUMIN 3.5  --   --   --   AST 22  --   --   --   ALT 14  --   --   --   ALKPHOS 32*  --   --   --   BILITOT 0.7  --   --   --   GFRNONAA >60 >60  --  >60  ANIONGAP 9 11  --  8    Lipids No  results for input(s): CHOL, TRIG, HDL, LABVLDL, LDLCALC, CHOLHDL in the last 168 hours.  Hematology Recent Labs  Lab 04/05/21 1912 04/06/21 0707  WBC 11.2* 11.7*  RBC 4.15 4.23  HGB 12.3 12.4  HCT 36.1 36.4  MCV 87.0 86.1  MCH 29.6 29.3  MCHC 34.1 34.1  RDW 12.6 12.6  PLT 597* 582*   Thyroid  Recent Labs  Lab 04/06/21 0707  TSH 3.374  FREET4 0.98    BNP Recent Labs  Lab 04/05/21 1912  BNP 92.2    DDimer No results for input(s): DDIMER in the last 168 hours.   Radiology    ECHOCARDIOGRAM COMPLETE  Result Date: 04/07/2021    ECHOCARDIOGRAM REPORT   Patient Name:   [name] Ross Date of Exam: 04/07/2021 Medical Rec #:  [id number]     Height:       66.5 in Accession #:    [id number]    Weight:       224.9 lb Date of Birth:  [birth year]      BSA:          2.114 m Patient Age:    55 years      BP:           100/83 mmHg Patient Gender: F             HR:           62 bpm. Exam Location:  [facility] Procedure: 2D Echo, Cardiac Doppler and Color Doppler Indications:     Atrial Fibrillation I48.91  History:         Patient has prior history of Echocardiogram examinations, most                  recent  12/20/2019. COPD, Arrythmias:Atrial Fibrillation; Risk                  Factors:Hypertension and Sleep Apnea.  Sonographer:     Sherrie Sport Referring Phys:  [place] Diagnosing Phys: Kathlyn Sacramento MD IMPRESSIONS  1. Left ventricular ejection fraction, by estimation, is 60 to 65%. The left ventricle has normal function. The left ventricle has no regional wall motion abnormalities. There is mild asymmetric left ventricular hypertrophy of the basal-septal segment. Left ventricular diastolic parameters are indeterminate.  2. Right ventricular systolic function is normal. The right ventricular size is normal. Tricuspid regurgitation signal is inadequate for assessing PA pressure.  3. The mitral valve is normal in structure. Trivial mitral valve regurgitation. No evidence of mitral stenosis.  4. The aortic valve is normal in structure. Aortic valve regurgitation is not visualized. No aortic stenosis is present. FINDINGS  Left Ventricle: Left ventricular ejection fraction, by estimation, is 60 to 65%. The left ventricle has normal function. The left ventricle has no regional wall motion abnormalities. The left ventricular internal cavity size was normal in size. There is  mild asymmetric left ventricular hypertrophy of the basal-septal segment. Left ventricular diastolic parameters are indeterminate. Right Ventricle: The right ventricular size is normal. No increase in right ventricular wall thickness. Right ventricular systolic function is normal. Tricuspid regurgitation signal is inadequate for assessing PA pressure. Left Atrium: Left atrial size was normal in size. Right Atrium: Right atrial size was normal in size. Pericardium: There is no evidence of pericardial effusion. Mitral Valve: The mitral valve is normal in structure. Trivial mitral valve regurgitation. No evidence of mitral valve stenosis. Tricuspid Valve: The tricuspid valve is normal in structure. Tricuspid valve regurgitation is not demonstrated.  No evidence of tricuspid stenosis. Aortic Valve: The aortic valve is normal in structure. Aortic valve regurgitation is not visualized. No aortic stenosis is present. Aortic valve mean gradient measures 4.0 mmHg. Aortic valve peak gradient measures 6.0 mmHg. Aortic valve area, by VTI measures 2.47 cm. Pulmonic Valve: The pulmonic valve was normal in structure. Pulmonic valve regurgitation is not visualized. No evidence of pulmonic stenosis. Aorta: The aortic root is normal in size and structure. Venous: The inferior vena cava was not well visualized. IAS/Shunts: No atrial level shunt detected by color flow Doppler.  LEFT VENTRICLE PLAX 2D LVIDd:         3.50 cm LVIDs:         2.10 cm LV PW:         1.00 cm LV IVS:        0.95 cm LVOT diam:     2.10 cm LV SV:         42 LV SV Index:   20 LVOT Area:     3.46 cm  RIGHT VENTRICLE RV Basal diam:  3.20 cm RV S prime:     15.60 cm/s TAPSE (M-mode): 4.0 cm LEFT ATRIUM             Index        RIGHT ATRIUM           Index LA diam:        3.70 cm 1.75 cm/m   RA Area:     14.60 cm LA Vol (A2C):   31.4 ml 14.86 ml/m  RA Volume:   28.70 ml  13.58 ml/m LA Vol (A4C):   36.7 ml 17.36 ml/m LA Biplane Vol: 36.3 ml 17.17 ml/m  AORTIC VALVE                    PULMONIC VALVE AV Area (Vmax):    2.19 cm     PV Vmax:        0.80 m/s AV Area (Vmean):   1.75 cm     PV Vmean:       59.400 cm/s AV Area (VTI):     2.47 cm     PV VTI:         0.138 m AV Vmax:           122.00 cm/s  PV Peak grad:   2.6 mmHg AV Vmean:          87.500 cm/s  PV Mean grad:   1.5 mmHg AV VTI:            0.170 m      RVOT Peak grad: 4 mmHg AV Peak Grad:      6.0 mmHg AV Mean Grad:      4.0 mmHg LVOT Vmax:         77.00 cm/s LVOT Vmean:        44.200 cm/s LVOT VTI:          0.121 m LVOT/AV VTI ratio: 0.71  AORTA Ao Root diam: 3.05 cm MITRAL VALVE                TRICUSPID VALVE MV Area (PHT): 5.27 cm     TR Peak grad:   20.8 mmHg MV Decel Time: 144 msec     TR Vmax:        228.00 cm/s MV E velocity: 126.00  cm/s  SHUNTS                             Systemic VTI:  0.12 m                             Systemic Diam: 2.10 cm                             Pulmonic VTI:  0.160 m Kathlyn Sacramento MD Electronically signed by Kathlyn Sacramento MD Signature Date/Time: 04/07/2021/11:21:36 AM    Final     Cardiac Studies   See echocardiogram above  Patient Profile     55 y.o. female with history of paroxysmal atrial fibrillation on flecainide followed by Dr. Quentin Ore, obstructive sleep apnea, morbid obesity, and GERD, admitted with atrial fibrillation with rapid ventricular response in the setting of influenza.  Assessment & Plan    Atrial fibrillation with rapid ventricular response: Patient with difficult to control ventricular rates despite high-dose carvedilol as well as diltiazem infusion.  She has undergone successful cardioversion with restoration of sinus rhythm. -Discontinue diltiazem infusion. -Continue carvedilol 37.5 mg twice daily and flecainide 50 mg twice daily. -Continue apixaban 5 mg twice daily. -Outpatient follow-up with Dr. Quentin Ore to discuss escalation of flecainide versus ablation for management of paroxysmal atrial fibrillation.  Hypertension: Blood pressure reasonably well controlled.  We will need to monitor with discontinuation of IV diltiazem. -Continue carvedilol and triamterene/HCTZ.  Disposition: Patient could be discharged as soon as later today if she maintains sinus rhythm.  We will arrange for follow-up in our office in 1-2 weeks as well as EP reevaluation.  For questions or updates, please contact [place] Please consult [email or web address] for contact info under [facility].    Signed, Nelva Bush, MD  04/08/2021, 8:44 AM

## 2021-04-08 NOTE — Transfer of Care (Signed)
Immediate Anesthesia Transfer of Care Note  Patient: [name] Ross  Procedure(s) Performed: CARDIOVERSION  Patient Location: Short Stay  Anesthesia Type:General  Level of Consciousness: awake  Airway & Oxygen Therapy: Patient Spontanous Breathing and Patient connected to nasal cannula oxygen  Post-op Assessment: Report given to RN and Post -op Vital signs reviewed and stable  Post vital signs: Reviewed and stable  Last Vitals:  Vitals Value Taken Time  BP 125/88 04/08/21 0828  Temp    Pulse 83 04/08/21 0829  Resp 16 04/08/21 0828  SpO2 100 % 04/08/21 0829    Last Pain:  Vitals:   04/08/21 0736  TempSrc: Oral  PainSc:          Complications: No notable events documented.

## 2021-04-08 NOTE — Discharge Summary (Addendum)
Physician Discharge Summary  [name] Ross:[id number] DOB: 21-Jul-1965 DOA: 04/05/2021  PCP: Leanna Battles, MD  Admit date: 04/05/2021 Discharge date: 04/08/2021  Discharge disposition: Home   Recommendations for Outpatient Follow-Up:   Follow-up with cardiologist as scheduled (office will call to schedule appointment)  Discharge Diagnosis:   Principal Problem:   Atrial fibrillation with rapid ventricular response ([place]) Active Problems:   OSA (obstructive sleep apnea)   Essential hypertension   Hypomagnesemia   URI (upper respiratory infection)    Discharge Condition: Stable.  Diet recommendation:  Diet Order             Diet regular Room service appropriate? Yes; Fluid consistency: Thin  Diet effective now           Diet - low sodium heart healthy                     Code Status: Full Code     Hospital Course:   Ms. [name] Ross is a 55 y.o. female with medical history significant for paroxysmal atrial fibrillation, GERD, OSA, morbid obesity, hypertension, who presented to the hospital because of elevated heart rate.  She said she was started on Tamiflu few days prior to admission for influenza A infection.   She was found to have atrial fibrillation/atrial flutter with rapid ventricular response, upper respiratory tract infection and hypomagnesemia.  Magnesium was repleted and she was given antitussives for cough.  She was treated with IV Cardizem infusion.  Home flecainide and carvedilol were continued.  Unfortunately, she failed to convert to normal sinus rhythm with aforementioned treatment.  She underwent synchronized cardioversion and she successfully converted to normal sinus rhythm.  Influenza test was negative in the hospital.  Her condition has improved and she is deemed stable for discharge to home today.  Patient takes ibuprofen as needed, daily meloxicam and Eliquis at home.  She was advised that taking NSAIDs and Eliquis increase the risk  of bleeding/complications.  However, she says she needs NSAIDs for her chronic back pain.  She decided that she will discontinue meloxicam and take ibuprofen only as needed.  Discharge plan discussed with the patient, her husband and daughter at the bedside.   Medical Consultants:   Cardiologist   Discharge Exam:    Vitals:   04/08/21 0845 04/08/21 0900 04/08/21 0924 04/08/21 1100  BP: 103/71 104/77 (!) 128/93 130/90  Pulse: 80 85 86 86  Resp: 12 13 18 20   Temp:   97.7 F (36.5 C) 98 F (36.7 C)  TempSrc:      SpO2: 97% 96% 98% 98%  Weight:      Height:         GEN: NAD SKIN: Warm and dry EYES: EOMI ENT: MMM CV: RRR PULM: CTA B ABD: soft, obese, NT, +BS CNS: AAO x 3, non focal EXT: No edema or tenderness   The results of significant diagnostics from this hospitalization (including imaging, microbiology, ancillary and laboratory) are listed below for reference.     Procedures and Diagnostic Studies:   ECHOCARDIOGRAM COMPLETE  Result Date: 04/07/2021    ECHOCARDIOGRAM REPORT   Patient Name:   [name] Ross Date of Exam: 04/07/2021 Medical Rec #:  [id number]     Height:       66.5 in Accession #:    [id number]    Weight:       224.9 lb Date of Birth:  1966/01/30      BSA:  2.114 m Patient Age:    88 years      BP:           100/83 mmHg Patient Gender: F             HR:           62 bpm. Exam Location:  [facility] Procedure: 2D Echo, Cardiac Doppler and Color Doppler Indications:     Atrial Fibrillation I48.91  History:         Patient has prior history of Echocardiogram examinations, most                  recent 12/20/2019. COPD, Arrythmias:Atrial Fibrillation; Risk                  Factors:Hypertension and Sleep Apnea.  Sonographer:     Sherrie Sport Referring Phys:  [place] Diagnosing Phys: Kathlyn Sacramento MD IMPRESSIONS  1. Left ventricular ejection fraction, by estimation, is 60 to 65%. The left ventricle has normal function. The left ventricle has no regional  wall motion abnormalities. There is mild asymmetric left ventricular hypertrophy of the basal-septal segment. Left ventricular diastolic parameters are indeterminate.  2. Right ventricular systolic function is normal. The right ventricular size is normal. Tricuspid regurgitation signal is inadequate for assessing PA pressure.  3. The mitral valve is normal in structure. Trivial mitral valve regurgitation. No evidence of mitral stenosis.  4. The aortic valve is normal in structure. Aortic valve regurgitation is not visualized. No aortic stenosis is present. FINDINGS  Left Ventricle: Left ventricular ejection fraction, by estimation, is 60 to 65%. The left ventricle has normal function. The left ventricle has no regional wall motion abnormalities. The left ventricular internal cavity size was normal in size. There is  mild asymmetric left ventricular hypertrophy of the basal-septal segment. Left ventricular diastolic parameters are indeterminate. Right Ventricle: The right ventricular size is normal. No increase in right ventricular wall thickness. Right ventricular systolic function is normal. Tricuspid regurgitation signal is inadequate for assessing PA pressure. Left Atrium: Left atrial size was normal in size. Right Atrium: Right atrial size was normal in size. Pericardium: There is no evidence of pericardial effusion. Mitral Valve: The mitral valve is normal in structure. Trivial mitral valve regurgitation. No evidence of mitral valve stenosis. Tricuspid Valve: The tricuspid valve is normal in structure. Tricuspid valve regurgitation is not demonstrated. No evidence of tricuspid stenosis. Aortic Valve: The aortic valve is normal in structure. Aortic valve regurgitation is not visualized. No aortic stenosis is present. Aortic valve mean gradient measures 4.0 mmHg. Aortic valve peak gradient measures 6.0 mmHg. Aortic valve area, by VTI measures 2.47 cm. Pulmonic Valve: The pulmonic valve was normal in structure.  Pulmonic valve regurgitation is not visualized. No evidence of pulmonic stenosis. Aorta: The aortic root is normal in size and structure. Venous: The inferior vena cava was not well visualized. IAS/Shunts: No atrial level shunt detected by color flow Doppler.  LEFT VENTRICLE PLAX 2D LVIDd:         3.50 cm LVIDs:         2.10 cm LV PW:         1.00 cm LV IVS:        0.95 cm LVOT diam:     2.10 cm LV SV:         42 LV SV Index:   20 LVOT Area:     3.46 cm  RIGHT VENTRICLE RV Basal diam:  3.20 cm  RV S prime:     15.60 cm/s TAPSE (M-mode): 4.0 cm LEFT ATRIUM             Index        RIGHT ATRIUM           Index LA diam:        3.70 cm 1.75 cm/m   RA Area:     14.60 cm LA Vol (A2C):   31.4 ml 14.86 ml/m  RA Volume:   28.70 ml  13.58 ml/m LA Vol (A4C):   36.7 ml 17.36 ml/m LA Biplane Vol: 36.3 ml 17.17 ml/m  AORTIC VALVE                    PULMONIC VALVE AV Area (Vmax):    2.19 cm     PV Vmax:        0.80 m/s AV Area (Vmean):   1.75 cm     PV Vmean:       59.400 cm/s AV Area (VTI):     2.47 cm     PV VTI:         0.138 m AV Vmax:           122.00 cm/s  PV Peak grad:   2.6 mmHg AV Vmean:          87.500 cm/s  PV Mean grad:   1.5 mmHg AV VTI:            0.170 m      RVOT Peak grad: 4 mmHg AV Peak Grad:      6.0 mmHg AV Mean Grad:      4.0 mmHg LVOT Vmax:         77.00 cm/s LVOT Vmean:        44.200 cm/s LVOT VTI:          0.121 m LVOT/AV VTI ratio: 0.71  AORTA Ao Root diam: 3.05 cm MITRAL VALVE                TRICUSPID VALVE MV Area (PHT): 5.27 cm     TR Peak grad:   20.8 mmHg MV Decel Time: 144 msec     TR Vmax:        228.00 cm/s MV E velocity: 126.00 cm/s                             SHUNTS                             Systemic VTI:  0.12 m                             Systemic Diam: 2.10 cm                             Pulmonic VTI:  0.160 m Kathlyn Sacramento MD Electronically signed by Kathlyn Sacramento MD Signature Date/Time: 04/07/2021/11:21:36 AM    Final      Labs:   Basic Metabolic Panel: Recent Labs  Lab  04/05/21 1912 04/06/21 0707 04/06/21 1344 04/07/21 0405  NA 135 137  --  137  K 4.0 3.5  --  4.2  CL 105 105  --  106  CO2 21* 21*  --  23  GLUCOSE 112* 119*  --  105*  BUN 14 14  --  22*  CREATININE 0.87 0.73  --  0.86  CALCIUM 9.0 9.1  --  8.9  MG 1.6*  --  2.0  --    GFR Estimated Creatinine Clearance: 89.1 mL/min (by C-G formula based on SCr of 0.86 mg/dL). Liver Function Tests: Recent Labs  Lab 04/05/21 1912  AST 22  ALT 14  ALKPHOS 32*  BILITOT 0.7  PROT 7.7  ALBUMIN 3.5   No results for input(s): LIPASE, AMYLASE in the last 168 hours. No results for input(s): AMMONIA in the last 168 hours. Coagulation profile Recent Labs  Lab 04/07/21 1701  INR 1.1    CBC: Recent Labs  Lab 04/05/21 1912 04/06/21 0707  WBC 11.2* 11.7*  NEUTROABS 7.7  --   HGB 12.3 12.4  HCT 36.1 36.4  MCV 87.0 86.1  PLT 597* 582*   Cardiac Enzymes: No results for input(s): CKTOTAL, CKMB, CKMBINDEX, TROPONINI in the last 168 hours. BNP: Invalid input(s): POCBNP CBG: No results for input(s): GLUCAP in the last 168 hours. D-Dimer No results for input(s): DDIMER in the last 72 hours. Hgb A1c Recent Labs    04/06/21 0707  HGBA1C 5.9*   Lipid Profile No results for input(s): CHOL, HDL, LDLCALC, TRIG, CHOLHDL, LDLDIRECT in the last 72 hours. Thyroid function studies Recent Labs    04/06/21 0707  TSH 3.374   Anemia work up No results for input(s): VITAMINB12, FOLATE, FERRITIN, TIBC, IRON, RETICCTPCT in the last 72 hours. Microbiology Recent Results (from the past 240 hour(s))  Resp Panel by RT-PCR (Flu A&B, Covid) Nasopharyngeal Swab     Status: None   Collection Time: 04/05/21  7:12 PM   Specimen: Nasopharyngeal Swab; Nasopharyngeal(NP) swabs in vial transport medium  Result Value Ref Range Status   SARS Coronavirus 2 by RT PCR NEGATIVE NEGATIVE Final    Comment: (NOTE) SARS-CoV-2 target nucleic acids are NOT DETECTED.  The SARS-CoV-2 RNA is generally detectable in  upper respiratory specimens during the acute phase of infection. The lowest concentration of SARS-CoV-2 viral copies this assay can detect is 138 copies/mL. A negative result does not preclude SARS-Cov-2 infection and should not be used as the sole basis for treatment or other patient management decisions. A negative result may occur with  improper specimen collection/handling, submission of specimen other than nasopharyngeal swab, presence of viral mutation(s) within the areas targeted by this assay, and inadequate number of viral copies(<138 copies/mL). A negative result must be combined with clinical observations, patient history, and epidemiological information. The expected result is Negative.  Fact Sheet for Patients:  EntrepreneurPulse.com.au  Fact Sheet for Healthcare Providers:  IncredibleEmployment.be  This test is no t yet approved or cleared by the [place] FDA and  has been authorized for detection and/or diagnosis of SARS-CoV-2 by FDA under an Emergency Use Authorization (EUA). This EUA will remain  in effect (meaning this test can be used) for the duration of the COVID-19 declaration under Section 564(b)(1) of the Act, 21 U.S.C.section 360bbb-3(b)(1), unless the authorization is terminated  or revoked sooner.       Influenza A by PCR NEGATIVE NEGATIVE Final   Influenza B by PCR NEGATIVE NEGATIVE Final    Comment: (NOTE) The Xpert Xpress SARS-CoV-2/FLU/RSV plus assay is intended as an aid in the diagnosis of influenza from Nasopharyngeal swab specimens and should not be used as a sole basis for treatment. Nasal washings and aspirates are unacceptable for Xpert Xpress SARS-CoV-2/FLU/RSV testing.  Fact Sheet for  Patients: EntrepreneurPulse.com.au  Fact Sheet for Healthcare Providers: IncredibleEmployment.be  This test is not yet approved or cleared by the [place] FDA and has been  authorized for detection and/or diagnosis of SARS-CoV-2 by FDA under an Emergency Use Authorization (EUA). This EUA will remain in effect (meaning this test can be used) for the duration of the COVID-19 declaration under Section 564(b)(1) of the Act, 21 U.S.C. section 360bbb-3(b)(1), unless the authorization is terminated or revoked.  Performed at [facility], [address], [place], [place] [address]      Discharge Instructions:   Discharge Instructions     Amb referral to [facility]IB Clinic   Complete by: As directed    Diet - low sodium heart healthy   Complete by: As directed    Discharge instructions   Complete by: As directed    Follow-up with cardiologist as scheduled.  Office will call to set up appointment.   Increase activity slowly   Complete by: As directed       Allergies as of 04/08/2021       Reactions   Venlafaxine    Other reaction(s): sleepy        Medication List     STOP taking these medications    diltiazem 180 MG 24 hr capsule Commonly known as: CARDIZEM CD   meloxicam 15 MG tablet Commonly known as: MOBIC   oseltamivir 75 MG capsule Commonly known as: TAMIFLU       TAKE these medications    acetaminophen 500 MG tablet Commonly known as: TYLENOL Take 1,000 mg by mouth every 6 (six) hours as needed for mild pain.   Alavert 10 MG dissolvable tablet Generic drug: loratadine Take 1 tablet by mouth daily.   buPROPion 300 MG 24 hr tablet Commonly known as: WELLBUTRIN XL Take 300 mg by mouth every morning.   butalbital-acetaminophen-caffeine 50-325-40 MG tablet Commonly known as: FIORICET Take by mouth 2 (two) times daily as needed for headache.   carvedilol 25 MG tablet Commonly known as: COREG Take 1.5 tablets (37.5 mg total) by mouth 2 (two) times daily.   dicyclomine 10 MG capsule Commonly known as: BENTYL Take one tablet by mouth once to twice daily as needed   Eliquis 5 MG Tabs tablet Generic drug:  apixaban TAKE 1 TABLET BY MOUTH TWICE DAILY   escitalopram 20 MG tablet Commonly known as: LEXAPRO Take 20 mg by mouth every morning.   estradiol 2 MG tablet Commonly known as: ESTRACE Take 2 mg by mouth daily.   fenofibrate micronized 200 MG capsule Commonly known as: LOFIBRA Take 200 mg by mouth daily before breakfast.   flecainide 50 MG tablet Commonly known as: TAMBOCOR TAKE 1 TABLET BY MOUTH TWICE DAILY   fluticasone 50 MCG/ACT nasal spray Commonly known as: FLONASE Place 1 spray into both nostrils daily.   HYDROcodone bit-homatropine 5-1.5 MG/5ML syrup Commonly known as: HYCODAN Take 5 mLs by mouth every 6 (six) hours as needed for cough.   ibuprofen 200 MG tablet Commonly known as: ADVIL Take 600-800 mg by mouth every 6 (six) hours as needed for moderate pain.   linaclotide 72 MCG capsule Commonly known as: LINZESS Take 72 mcg by mouth daily as needed.   losartan 100 MG tablet Commonly known as: COZAAR Take 100 mg by mouth every morning.   meclizine 25 MG tablet Commonly known as: ANTIVERT Take 1 tablet (25 mg total) by mouth 3 (three) times daily as needed for dizziness.   mometasone 0.1 %  ointment Commonly known as: ELOCON Apply topically 2 (two) times daily as needed.   omega-3 acid ethyl esters 1 g capsule Commonly known as: Lovaza Take 1 capsule (1 g total) by mouth 2 (two) times daily.   omeprazole 20 MG capsule Commonly known as: PRILOSEC Take 1 capsule (20 mg total) by mouth daily.   ondansetron 4 MG disintegrating tablet Commonly known as: ZOFRAN-ODT Take 4 mg by mouth as directed.   Ozempic (2 MG/DOSE) 8 MG/3ML Sopn Generic drug: Semaglutide (2 MG/DOSE) Inject 2 mg into the skin once a week.   progesterone 200 MG capsule Commonly known as: PROMETRIUM Take 200 mg by mouth at bedtime.   tacrolimus 0.1 % ointment Commonly known as: PROTOPIC Apply 1 application topically 2 (two) times daily as needed. For psoriasis   triamcinolone  cream 0.1 % Commonly known as: KENALOG Apply 1 application topically 2 (two) times daily as needed.   triamterene-hydrochlorothiazide 37.5-25 MG tablet Commonly known as: MAXZIDE-25 Take 0.5 tablets by mouth every morning.   valACYclovir 1000 MG tablet Commonly known as: VALTREX Take 1,000 mg by mouth 2 (two) times daily as needed.   zolpidem 10 MG tablet Commonly known as: AMBIEN Take 10 mg by mouth at bedtime as needed for sleep.           If you experience worsening of your admission symptoms, develop shortness of breath, life threatening emergency, suicidal or homicidal thoughts you must seek medical attention immediately by calling 911 or calling your MD immediately  if symptoms less severe.   You must read complete instructions/literature along with all the possible adverse reactions/side effects for all the medicines you take and that have been prescribed to you. Take any new medicines after you have completely understood and accept all the possible adverse reactions/side effects.    Please note   You were cared for by a hospitalist during your hospital stay. If you have any questions about your discharge medications or the care you received while you were in the hospital after you are discharged, you can call the unit and asked to speak with the hospitalist on call if the hospitalist that took care of you is not available. Once you are discharged, your primary care physician will handle any further medical issues. Please note that NO REFILLS for any discharge medications will be authorized once you are discharged, as it is imperative that you return to your primary care physician (or establish a relationship with a primary care physician if you do not have one) for your aftercare needs so that they can reassess your need for medications and monitor your lab values.       Time coordinating discharge: 32 minutes  Signed:  Margaret Staggs  [facility] 04/08/2021,  2:55 PM   Pager on [email or web address]. If 7PM-7AM, please contact night-coverage at [email or web address]

## 2021-04-08 NOTE — Anesthesia Postprocedure Evaluation (Signed)
Anesthesia Post Note  Patient: [name] Ross  Procedure(s) Performed: CARDIOVERSION  Patient location during evaluation: Endoscopy Anesthesia Type: General Level of consciousness: awake and alert Pain management: pain level controlled Vital Signs Assessment: post-procedure vital signs reviewed and stable Respiratory status: spontaneous breathing, nonlabored ventilation, respiratory function stable and patient connected to nasal cannula oxygen Cardiovascular status: blood pressure returned to baseline and stable Postop Assessment: no apparent nausea or vomiting Anesthetic complications: no   No notable events documented.   Last Vitals:  Vitals:   04/08/21 0845 04/08/21 0900  BP: 103/71 104/77  Pulse: 80 85  Resp: 12 13  Temp:    SpO2: 97% 96%    Last Pain:  Vitals:   04/08/21 0900  TempSrc:   PainSc: 0-No pain                 Arita Miss

## 2021-04-08 NOTE — Interval H&P Note (Signed)
History and Physical Interval Note:  04/08/2021 8:24 AM  [name] Ross  has presented today for surgery, with the diagnosis of atrial fibrillation.  The various methods of treatment have been discussed with the patient and family. After consideration of risks, benefits and other options for treatment, the patient has consented to  Procedure(s): CARDIOVERSION (N/A) as a surgical intervention.  The patient's history has been reviewed, patient examined, no change in status, stable for surgery.  I have reviewed the patient's chart and labs.  Questions were answered to the patient's satisfaction.     Bryttany Tortorelli

## 2021-04-08 NOTE — Anesthesia Preprocedure Evaluation (Signed)
Anesthesia Evaluation  Patient identified by MRN, date of birth, ID band Patient awake    Reviewed: Allergy & Precautions, NPO status , Patient's Chart, lab work & pertinent test results  History of Anesthesia Complications (+) PONV and history of anesthetic complications  Airway Mallampati: III  TM Distance: >3 FB Neck ROM: Full    Dental no notable dental hx. (+) Teeth Intact   Pulmonary sleep apnea and Continuous Positive Airway Pressure Ventilation , neg COPD, Recent URI , Residual Cough, Patient abstained from smoking.Not current smoker,  Unspecified cold/URI since 7 days ago. Patient has hoarse voice, productive cough. No fevers. Patient has tested negative for covid and influenza.   Pulmonary exam normal breath sounds clear to auscultation       Cardiovascular Exercise Tolerance: Good METShypertension, (-) CAD and (-) Past MI + dysrhythmias Atrial Fibrillation  Rhythm:Irregular Rate:Tachycardia - Systolic murmurs    Neuro/Psych PSYCHIATRIC DISORDERS Depression negative neurological ROS     GI/Hepatic GERD  Medicated,(+)     (-) substance abuse  ,   Endo/Other  neg diabetes  Renal/GU negative Renal ROS     Musculoskeletal   Abdominal   Peds  Hematology   Anesthesia Other Findings Past Medical History: No date: Anal skin tag No date: Atrial fibrillation (HCC) No date: Depression No date: Diverticulosis No date: Diverticulosis, sigmoid No date: Dyslipidemia No date: GERD (gastroesophageal reflux disease) No date: Hemorrhoids     Comment:  internal and external No date: History of diverticulitis     Comment:  10/ 2016 No date: History of galactorrhea No date: History of stress test     Comment:  a. Pt reports h/o stress test and echo by cardiologist               in [place] (no records in [place]).  Both reportedly nl. No date: Hypertension No date: IBS (irritable bowel syndrome) No date: Morbid obesity  (HCC) No date: PONV (postoperative nausea and vomiting)     Comment:  severe No date: Rash     Comment:  elbows and behind knees No date: Restless leg syndrome No date: Sleep apnea     Comment:  cpap sometimes No date: Wears glasses  Reproductive/Obstetrics                             Anesthesia Physical Anesthesia Plan  ASA: 3  Anesthesia Plan: General   Post-op Pain Management:    Induction: Intravenous  PONV Risk Score and Plan: 4 or greater and Ondansetron, Propofol infusion and TIVA  Airway Management Planned: Nasal Cannula  Additional Equipment: None  Intra-op Plan:   Post-operative Plan:   Informed Consent: I have reviewed the patients History and Physical, chart, labs and discussed the procedure including the risks, benefits and alternatives for the proposed anesthesia with the patient or authorized representative who has indicated his/her understanding and acceptance.     Dental advisory given  Plan Discussed with: CRNA and Surgeon  Anesthesia Plan Comments: (Patient is inpatient, admitted for symptomatic tachycardia, has been difficult to control with antiarrhythmics and beta blockers. Given more urgent need for cardioversion, we will proceed despite patient's unresolved URI. Discussed risks of anesthesia with patient, including possibility of difficulty with spontaneous ventilation under anesthesia necessitating airway intervention, increased risk or respiratory issues given active URI, PONV, and rare risks such as cardiac or respiratory or neurological events, and allergic reactions. Discussed the role of CRNA in patient's perioperative  care. Patient understands.)        Anesthesia Quick Evaluation

## 2021-04-09 ENCOUNTER — Telehealth: Payer: Self-pay | Admitting: Cardiology

## 2021-04-09 NOTE — Telephone Encounter (Signed)
See my chart response .

## 2021-04-09 NOTE — Telephone Encounter (Signed)
Pt c/o medication issue:  1. Name of Medication: cardizem   2. How are you currently taking this medication (dosage and times per day)? Hospital said dc   3. Are you having a reaction (difficulty breathing--STAT)? No   4. What is your medication issue? Patient told to dc cardizem at dc and wants to make sure ok with lambert. If no answer ok to leave detailed msg

## 2021-04-10 ENCOUNTER — Encounter: Payer: Self-pay | Admitting: Cardiology

## 2021-04-15 ENCOUNTER — Encounter: Payer: Self-pay | Admitting: Gastroenterology

## 2021-04-16 ENCOUNTER — Telehealth: Payer: Self-pay | Admitting: Gastroenterology

## 2021-04-16 NOTE — Telephone Encounter (Signed)
Patient has an EGD/colon scheduled for 12/2 at 2:30.  Patient wanted to let you know she just got out of the hospital for A-Fib (she was in there from Saturday to today) and they had to do a cardioversion on her.  She also left a message in My Chart.  She wanted to make sure you knew this and let her know if she was still OK to proceed with the procedure.  Please call patient and advise.  Thank you.

## 2021-04-16 NOTE — Telephone Encounter (Signed)
[place] thanks for letting me know. I think would be best to take her off the schedule for next week and schedule sometime in January. Would be best to give her some time to recover from her hospital stay and make sure she is stable in regards to the atrial fibrillation. Would need approval again to hold her anticoagulation, if they don't want her to stop it for a period of time after her hospital stay may need to delay it more. Thanks

## 2021-04-18 NOTE — Progress Notes (Deleted)
Cardiology Office Note    Date:  04/18/2021   ID:  [name] Ross, DOB February 07, 1966, MRN [id number]  PCP:  Leanna Battles, MD  Cardiologist:  Kate Sable, MD  Electrophysiologist:  Vickie Epley, MD   Chief Complaint: ***  History of Present Illness:   [name] Ross is a 55 y.o. female with history of ***  Urine drug screen positive for MDMA High-sensitivity troponin negative x2 BNP 92  ***   Labs independently reviewed: 03/2021 - potassium 4.2, BUN 22, serum creatinine 0.86, magnesium 2.0, A1c 5.9, Hgb 12.4, PLT 582, TSH normal, albumin 3.5, AST/ALT normal 11/2019 - direct LDL 66, TC 204, TG 510, HDL 39  Past Medical History:  Diagnosis Date   Anal skin tag    Atrial fibrillation ([place])    Depression    Diverticulosis    Diverticulosis, sigmoid    Dyslipidemia    GERD (gastroesophageal reflux disease)    Hemorrhoids    internal and external   History of diverticulitis    10/ 2016   History of galactorrhea    History of stress test    a. Pt reports h/o stress test and echo by cardiologist in [place] (no records in [place]).  Both reportedly nl.   Hypertension    IBS (irritable bowel syndrome)    Morbid obesity (HCC)    PONV (postoperative nausea and vomiting)    severe   Rash    elbows and behind knees   Restless leg syndrome    Sleep apnea    cpap sometimes   Wears glasses     Past Surgical History:  Procedure Laterality Date   BAND HEMORRHOIDECTOMY  2012   Dr Earlean Shawl   CARDIOVERSION N/A 04/08/2021   Procedure: CARDIOVERSION;  Surgeon: Nelva Bush, MD;  Location: [facility];  Service: Cardiovascular;  Laterality: N/A;   Nitro   COLONOSCOPY  06/18/2011   CYSTO/  BILATERAL PYELOGRAM RETROGRADES/  TRANSOBTURATOR SLING  03/08/2006   EXCISION OF SKIN TAG N/A 02/13/2016   Procedure: ANAL SKIN TAG EXCISION;  Surgeon: [name], MD;  Location: [facility];  Service: General;  Laterality: N/A;   LAPAROSCOPIC  CHOLECYSTECTOMY  05/22/2001   SHOULDER SURGERY      Current Medications: No outpatient medications have been marked as taking for the 04/24/21 encounter (Appointment) with Rise Mu, PA-C.    Allergies:   Venlafaxine   Social History   Socioeconomic History   Marital status: Married    Spouse name: [name]   Number of children: 2   Years of education: RN   Highest education level: Not on file  Occupational History   Occupation: Therapist, sports  Tobacco Use   Smoking status: Never   Smokeless tobacco: Never  Vaping Use   Vaping Use: Never used  Substance and Sexual Activity   Alcohol use: No    Alcohol/week: 0.0 standard drinks   Drug use: No   Sexual activity: Not on file  Other Topics Concern   Not on file  Social History Narrative   Lives in [place].  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  Occasionally drinks coffee but not a heavy caffeine drinker.     Social Determinants of Health   Financial Resource Strain: Not on file  Food Insecurity: Not on file  Transportation Needs: Not on file  Physical Activity: Not on file  Stress: Not on file  Social Connections: Not on file     Family  History:  The patient's family history includes Atrial fibrillation in her mother; Breast cancer in an other family member; Colon cancer in her mother; Diabetes in her father, mother, sister, and sister; Hypertension in her father and mother; Skin cancer in her father; Suicidality in her father.  ROS:   ROS   EKGs/Labs/Other Studies Reviewed:    Studies reviewed were summarized above. The additional studies were reviewed today:  2D echo 04/07/2021: 1. Left ventricular ejection fraction, by estimation, is 60 to 65%. The  left ventricle has normal function. The left ventricle has no regional  wall motion abnormalities. There is mild asymmetric left ventricular  hypertrophy of the basal-septal segment.  Left ventricular diastolic parameters are indeterminate.   2. Right  ventricular systolic function is normal. The right ventricular  size is normal. Tricuspid regurgitation signal is inadequate for assessing  PA pressure.   3. The mitral valve is normal in structure. Trivial mitral valve  regurgitation. No evidence of mitral stenosis.   4. The aortic valve is normal in structure. Aortic valve regurgitation is  not visualized. No aortic stenosis is present. __________  Carlton Adam MPI 08/30/2020: Blood pressure demonstrated a normal response to exercise. The patient exercised for 5 minutes and was not able to achieve 85% maximum predicted heart rate and thus switched to [place]. There was no ST segment deviation noted during stress. The study is normal. This is a low risk study. The left ventricular ejection fraction is normal (55-65%). CT attenuation images showed no evidence of aortic or coronary calcifications. __________  Elwyn Reach patch 06/2020: Patient had a min HR of 53 bpm, max HR of 197 bpm, and avg HR of 83 bpm. Predominant underlying rhythm was Sinus Rhythm. 148 Supraventricular Tachycardia runs occurred, the run with the fastest interval lasting 4 beats with a max rate of 197 bpm, the  longest lasting 13 beats with an avg rate of 127 bpm. Atrial Fibrillation occurred (<1% burden), ranging from 74-166 bpm (avg of 112 bpm), the longest lasting 28 mins 17 secs with an avg rate of 113 bpm. Supraventricular Tachycardia and Atrial  Fibrillation were detected within +/- 45 seconds of symptomatic patient event(s). __________  2D echo 12/20/2019: 1. Left ventricular ejection fraction, by estimation, is 60 to 65%. The  left ventricle has normal function. The left ventricle has no regional  wall motion abnormalities. Left ventricular diastolic parameters were  normal.   2. Right ventricular systolic function is normal. The right ventricular  size is normal.   3. The mitral valve is normal in structure. Trivial mitral valve  regurgitation.   4. The aortic valve is  normal in structure. Aortic valve regurgitation is  not visualized.   EKG:  EKG is ordered today.  The EKG ordered today demonstrates ***  Recent Labs: 04/05/2021: ALT 14; B Natriuretic Peptide 92.2 04/06/2021: Hemoglobin 12.4; Magnesium 2.0; Platelets 582; TSH 3.374 04/07/2021: BUN 22; Creatinine, Ser 0.86; Potassium 4.2; Sodium 137  Recent Lipid Panel    Component Value Date/Time   CHOL 204 (H) 12/20/2019 0502   TRIG 510 (H) 12/20/2019 0502   HDL 39 (L) 12/20/2019 0502   CHOLHDL 5.2 12/20/2019 0502   VLDL UNABLE TO CALCULATE IF TRIGLYCERIDE OVER 400 mg/dL 12/20/2019 0502   LDLCALC UNABLE TO CALCULATE IF TRIGLYCERIDE OVER 400 mg/dL 12/20/2019 0502   LDLDIRECT 66.8 12/20/2019 0502    PHYSICAL EXAM:    VS:  There were no vitals taken for this visit.  BMI: There is no height or weight on  file to calculate BMI.  Physical Exam  Wt Readings from Last 3 Encounters:  04/08/21 224 lb 13.9 oz (102 kg)  03/21/21 225 lb 3.2 oz (102.2 kg)  12/25/20 230 lb (104.3 kg)     ASSESSMENT & PLAN:   ***   {Are you ordering a CV Procedure (e.g. stress test, cath, DCCV, TEE, etc)?   Press F2        :[id number]}     Disposition: F/u with Dr. [name] or an APP in ***, and EP as directed.   Medication Adjustments/Labs and Tests Ordered: Current medicines are reviewed at length with the patient today.  Concerns regarding medicines are outlined above. Medication changes, Labs and Tests ordered today are summarized above and listed in the Patient Instructions accessible in Encounters.   Signed, [name], PA-C 04/18/2021 9:26 AM     [place] [address] [place] [address] [place], [place] [address] [phone]

## 2021-04-21 ENCOUNTER — Telehealth: Payer: Self-pay

## 2021-04-21 NOTE — Telephone Encounter (Signed)
New cardiac clearance request sent today. Will await response.

## 2021-04-21 NOTE — Telephone Encounter (Signed)
[facility] Pre-operative Risk Assessment     Request for surgical clearance:     Endoscopy Procedure  What type of surgery is being performed?     Endoscopy/colonoscopy  When is this surgery scheduled?     06/10/21  What type of clearance is required ?   Pharmacy  Are there any medications that need to be held prior to surgery and how long? Eliquis, 2-day hold  Practice name and name of physician performing surgery?      [facility]  What is your office phone and fax number?      Phone- [phone]  [phone]  Anesthesia type (None, local, MAC, general) ?       MAC

## 2021-04-21 NOTE — Telephone Encounter (Signed)
Lm on vm for patient to return call to reschedule her procedures in January.

## 2021-04-21 NOTE — Telephone Encounter (Signed)
Pt has appt 04/23/21 with Dr. Quentin Ore, pre op clearance on appt notes.     Staff message sent to [facility] Scheduling team: 04/24/21 APPT WITH [name], PAC Received: Today Michae Kava, CMA  P [facility] [place] Scheduling Good afternoon everyone,   Per Doreene Adas, PAC pre op provider today, said the appt 04/24/21 with [name], [facility] can be pushed out a bit further. Pt has appt 04/23/21 with Dr. Quentin Ore who will address recent hospitalization as well as the pre op clearance. Would someone please reach out to the pt and let her know that you guys can move Ryan's appt out a bit further per Doreene Adas, PAC. Otherwise the pt will have appts 2 days in a row. The one for Thurmond Butts is NOT for pre op.   Thank you  Arbie Cookey

## 2021-04-21 NOTE — Telephone Encounter (Signed)
   Name: [name] Ross  DOB: 07/04/1965  MRN: [id number]  Primary Cardiologist: Kate Sable, MD  Chart reviewed as part of pre-operative protocol coverage. Because of [name]'s past medical history and time since last visit, she will require a follow-up visit in order to better assess preoperative cardiovascular risk.   Has hospital follow up appt with Dr. Quentin Ore - who will address eliquis hold at that time.   Pre-op covering staff: - Please schedule appointment and call patient to inform them. If patient already had an upcoming appointment within acceptable timeframe, please add "pre-op clearance" to the appointment notes so provider is aware. - Please contact requesting surgeon's office via preferred method (i.e, phone, fax) to inform them of need for appointment prior to surgery.  If applicable, this message will also be routed to pharmacy pool and/or primary cardiologist for input on holding anticoagulant/antiplatelet agent as requested below so that this information is available to the clearing provider at time of patient's appointment.   [place], PA  04/21/2021, 4:04 PM

## 2021-04-21 NOTE — Telephone Encounter (Signed)
Rescheduled for 1/17.

## 2021-04-23 ENCOUNTER — Other Ambulatory Visit: Payer: Self-pay

## 2021-04-23 ENCOUNTER — Ambulatory Visit (INDEPENDENT_AMBULATORY_CARE_PROVIDER_SITE_OTHER): Payer: 59 | Admitting: Cardiology

## 2021-04-23 ENCOUNTER — Encounter: Payer: Self-pay | Admitting: Cardiology

## 2021-04-23 VITALS — BP 100/74 | HR 84 | Ht 66.0 in | Wt 215.0 lb

## 2021-04-23 DIAGNOSIS — I10 Essential (primary) hypertension: Secondary | ICD-10-CM

## 2021-04-23 DIAGNOSIS — I483 Typical atrial flutter: Secondary | ICD-10-CM | POA: Diagnosis not present

## 2021-04-23 DIAGNOSIS — G4733 Obstructive sleep apnea (adult) (pediatric): Secondary | ICD-10-CM | POA: Diagnosis not present

## 2021-04-23 DIAGNOSIS — I480 Paroxysmal atrial fibrillation: Secondary | ICD-10-CM

## 2021-04-23 MED ORDER — LOSARTAN POTASSIUM 50 MG PO TABS: 50.0000 mg | ORAL_TABLET | Freq: Every day | ORAL | 3 refills | Status: DC

## 2021-04-23 NOTE — Patient Instructions (Addendum)
Medication Instructions:  Your physician has recommended you make the following change in your medication:    Decrease your losartan to 50 mg by  mouth daily   Lab Work: None ordered. If you have labs (blood work) drawn today and your tests are completely normal, you will receive your results only by: [place] (if you have MyChart) OR A paper copy in the mail If you have any lab test that is abnormal or we need to change your treatment, we will call you to review the results.  Testing/Procedures: Cardiac ablation:  July 31, 2021  Follow-Up:  We will reconvene in January 2023 to follow up on ablation July 31, 2021.  Cardiac Ablation Cardiac ablation is a procedure to destroy, or ablate, a small amount of heart tissue in very specific places. The heart has many electrical connections. Sometimes these connections are abnormal and can cause the heart to beat very fast or irregularly. Ablating some of the areas that cause problems can improve the heart's rhythm or return it to normal. Ablation may be done for people who: Have Wolff-Parkinson-White syndrome. Have fast heart rhythms (tachycardia). Have taken medicines for an abnormal heart rhythm (arrhythmia) that were not effective or caused side effects. Have a high-risk heartbeat that may be life-threatening. During the procedure, a small incision is made in the neck or the groin, and a long, thin tube (catheter) is inserted into the incision and moved to the heart. Small devices (electrodes) on the tip of the catheter will send out electrical currents. A type of X-Pischke (fluoroscopy) will be used to help guide the catheter and to provide images of the heart. Tell a health care provider about: Any allergies you have. All medicines you are taking, including vitamins, herbs, eye drops, creams, and over-the-counter medicines. Any problems you or family members have had with anesthetic medicines. Any blood disorders you have. Any  surgeries you have had. Any medical conditions you have, such as kidney failure. Whether you are pregnant or may be pregnant. What are the risks? Generally, this is a safe procedure. However, problems may occur, including: Infection. Bruising and bleeding at the catheter insertion site. Bleeding into the chest, especially into the sac that surrounds the heart. This is a serious complication. Stroke or blood clots. Damage to nearby structures or organs. Allergic reaction to medicines or dyes. Need for a permanent pacemaker if the normal electrical system is damaged. A pacemaker is a small computer that sends electrical signals to the heart and helps your heart beat normally. The procedure not being fully effective. This may not be recognized until months later. Repeat ablation procedures are sometimes done. What happens before the procedure? Medicines Ask your health care provider about: Changing or stopping your regular medicines. This is especially important if you are taking diabetes medicines or blood thinners. Taking medicines such as aspirin and ibuprofen. These medicines can thin your blood. Do not take these medicines unless your health care provider tells you to take them. Taking over-the-counter medicines, vitamins, herbs, and supplements. General instructions Follow instructions from your health care provider about eating or drinking restrictions. Plan to have someone take you home from the hospital or clinic. If you will be going home right after the procedure, plan to have someone with you for 24 hours. Ask your health care provider what steps will be taken to prevent infection. What happens during the procedure?  An IV will be inserted into one of your veins. You will be given a medicine  to help you relax (sedative). The skin on your neck or groin will be numbed. An incision will be made in your neck or your groin. A needle will be inserted through the incision and into a  large vein in your neck or groin. A catheter will be inserted into the needle and moved to your heart. Dye may be injected through the catheter to help your surgeon see the area of the heart that needs treatment. Electrical currents will be sent from the catheter to ablate heart tissue in desired areas. There are three types of energy that may be used to do this: Heat (radiofrequency energy). Laser energy. Extreme cold (cryoablation). When the tissue has been ablated, the catheter will be removed. Pressure will be held on the insertion area to prevent a lot of bleeding. A bandage (dressing) will be placed over the insertion area. The exact procedure may vary among health care providers and hospitals. What happens after the procedure? Your blood pressure, heart rate, breathing rate, and blood oxygen level will be monitored until you leave the hospital or clinic. Your insertion area will be monitored for bleeding. You will need to lie still for a few hours to ensure that you do not bleed from the insertion area. Do not drive for 24 hours or as long as told by your health care provider. Summary Cardiac ablation is a procedure to destroy, or ablate, a small amount of heart tissue using an electrical current. This procedure can improve the heart rhythm or return it to normal. Tell your health care provider about any medical conditions you may have and all medicines you are taking to treat them. This is a safe procedure, but problems may occur. Problems may include infection, bruising, damage to nearby organs or structures, or allergic reactions to medicines. Follow your health care provider's instructions about eating and drinking before the procedure. You may also be told to change or stop some of your medicines. After the procedure, do not drive for 24 hours or as long as told by your health care provider. This information is not intended to replace advice given to you by your health care provider.  Make sure you discuss any questions you have with your health care provider. Document Revised: 03/20/2019 Document Reviewed: 03/20/2019 Elsevier Patient Education  [place].

## 2021-04-23 NOTE — Progress Notes (Signed)
Electrophysiology Office Follow up Visit Note:    Date:  04/23/2021   ID:  [name] Ross, DOB [birth year], MRN [id number]  PCP:  Leanna Battles, MD  [facility] Cardiologist:  Kate Sable, MD  [facility] Electrophysiologist:  Vickie Epley, MD    Interval History:    [name] Ross is a 55 y.o. female who presents for a follow up visit. They were last seen in clinic December 25, 2020.  That appointment was for her paroxysmal atrial fibrillation.  She was on flecainide at the time of our last appointment.  She was seen in the emergency department April 05, 2021 for atrial fibrillation with rapid ventricular rates.  She was cardioverted on April 08, 2021.  She is clearly symptomatic in atrial fibrillation and wishes to pursue rhythm control.  She continues to take Eliquis for stroke prophylaxis without bleeding issues.     Past Medical History:  Diagnosis Date   Anal skin tag    Atrial fibrillation ([place])    Depression    Diverticulosis    Diverticulosis, sigmoid    Dyslipidemia    GERD (gastroesophageal reflux disease)    Hemorrhoids    internal and external   History of diverticulitis    10/ 2016   History of galactorrhea    History of stress test    a. Pt reports h/o stress test and echo by cardiologist in [place] (no records in [place]).  Both reportedly nl.   Hypertension    IBS (irritable bowel syndrome)    Morbid obesity (HCC)    PONV (postoperative nausea and vomiting)    severe   Rash    elbows and behind knees   Restless leg syndrome    Sleep apnea    cpap sometimes   Wears glasses     Past Surgical History:  Procedure Laterality Date   BAND HEMORRHOIDECTOMY  2012   Dr Earlean Shawl   CARDIOVERSION N/A 04/08/2021   Procedure: CARDIOVERSION;  Surgeon: Nelva Bush, MD;  Location: [facility] ORS;  Service: Cardiovascular;  Laterality: N/A;   [place]   COLONOSCOPY  06/18/2011   CYSTO/  BILATERAL PYELOGRAM RETROGRADES/  TRANSOBTURATOR SLING   03/08/2006   EXCISION OF SKIN TAG N/A 02/13/2016   Procedure: ANAL SKIN TAG EXCISION;  Surgeon: [name], MD;  Location: [facility];  Service: General;  Laterality: N/A;   LAPAROSCOPIC CHOLECYSTECTOMY  05/22/2001   SHOULDER SURGERY      Current Medications: Current Meds  Medication Sig   acetaminophen (TYLENOL) 500 MG tablet Take 1,000 mg by mouth every 6 (six) hours as needed for mild pain.   buPROPion (WELLBUTRIN XL) 300 MG 24 hr tablet Take 300 mg by mouth every morning.    butalbital-acetaminophen-caffeine (FIORICET) 50-325-40 MG tablet Take by mouth 2 (two) times daily as needed for headache.   carvedilol (COREG) 25 MG tablet Take 1.5 tablets (37.5 mg total) by mouth 2 (two) times daily.   dicyclomine (BENTYL) 10 MG capsule Take one tablet by mouth once to twice daily as needed   ELIQUIS 5 MG TABS tablet TAKE 1 TABLET BY MOUTH TWICE DAILY   escitalopram (LEXAPRO) 20 MG tablet Take 20 mg by mouth every morning.    estradiol (ESTRACE) 2 MG tablet Take 2 mg by mouth daily.   fenofibrate micronized (LOFIBRA) 200 MG capsule Take 200 mg by mouth daily before breakfast.    flecainide (TAMBOCOR) 50 MG tablet TAKE 1 TABLET BY MOUTH TWICE DAILY  fluticasone (FLONASE) 50 MCG/ACT nasal spray Place 1 spray into both nostrils daily.   HYDROcodone bit-homatropine (HYCODAN) 5-1.5 MG/5ML syrup Take 5 mLs by mouth every 6 (six) hours as needed for cough.   ibuprofen (ADVIL) 200 MG tablet Take 600-800 mg by mouth every 6 (six) hours as needed for moderate pain.   linaclotide (LINZESS) 72 MCG capsule Take 72 mcg by mouth daily as needed.   loratadine (ALAVERT) 10 MG dissolvable tablet Take 1 tablet by mouth daily.   losartan (COZAAR) 100 MG tablet Take 100 mg by mouth every morning.    magnesium oxide (MAG-OX) 400 (240 Mg) MG tablet Take 1 tablet by mouth daily.   meclizine (ANTIVERT) 25 MG tablet Take 1 tablet (25 mg total) by mouth 3 (three) times daily as needed for dizziness.    mometasone (ELOCON) 0.1 % ointment Apply topically 2 (two) times daily as needed.   omega-3 acid ethyl esters (LOVAZA) 1 g capsule Take 1 capsule (1 g total) by mouth 2 (two) times daily.   omeprazole (PRILOSEC) 20 MG capsule Take 1 capsule (20 mg total) by mouth daily.   ondansetron (ZOFRAN-ODT) 4 MG disintegrating tablet Take 4 mg by mouth as directed.   OZEMPIC, 2 MG/DOSE, 8 MG/3ML SOPN Inject 2 mg into the skin once a week.   progesterone (PROMETRIUM) 200 MG capsule Take 200 mg by mouth at bedtime.   tacrolimus (PROTOPIC) 0.1 % ointment Apply 1 application topically 2 (two) times daily as needed. For psoriasis   triamcinolone (KENALOG) 0.1 % Apply 1 application topically 2 (two) times daily as needed.   triamterene-hydrochlorothiazide (MAXZIDE-25) 37.5-25 MG per tablet Take 0.5 tablets by mouth every morning.    valACYclovir (VALTREX) 1000 MG tablet Take 1,000 mg by mouth 2 (two) times daily as needed.    zolpidem (AMBIEN) 10 MG tablet Take 10 mg by mouth at bedtime as needed for sleep.     Allergies:   Venlafaxine   Social History   Socioeconomic History   Marital status: Married    Spouse name: [name]   Number of children: 2   Years of education: RN   Highest education level: Not on file  Occupational History   Occupation: Therapist, sports  Tobacco Use   Smoking status: Never   Smokeless tobacco: Never  Vaping Use   Vaping Use: Never used  Substance and Sexual Activity   Alcohol use: No    Alcohol/week: 0.0 standard drinks   Drug use: No   Sexual activity: Not on file  Other Topics Concern   Not on file  Social History Narrative   Lives in [place].  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  Occasionally drinks coffee but not a heavy caffeine drinker.     Social Determinants of Health   Financial Resource Strain: Not on file  Food Insecurity: Not on file  Transportation Needs: Not on file  Physical Activity: Not on file  Stress: Not on file  Social Connections: Not  on file     Family History: The patient's family history includes Atrial fibrillation in her mother; Breast cancer in an other family member; Colon cancer in her mother; Diabetes in her father, mother, sister, and sister; Hypertension in her father and mother; Skin cancer in her father; Suicidality in her father.  ROS:   Please see the history of present illness.    All other systems reviewed and are negative.  EKGs/Labs/Other Studies Reviewed:    The following studies were reviewed today:  April 07, 2021 echo Left ventricular function normal, 60% Right ventricular function normal Trivial MR   EKG:  The ekg ordered today demonstrates sinus rhythm.  PR interval 170 ms.  QRS duration 80 ms.  Recent Labs: 04/05/2021: ALT 14; B Natriuretic Peptide 92.2 04/06/2021: Hemoglobin 12.4; Magnesium 2.0; Platelets 582; TSH 3.374 04/07/2021: BUN 22; Creatinine, Ser 0.86; Potassium 4.2; Sodium 137  Recent Lipid Panel    Component Value Date/Time   CHOL 204 (H) 12/20/2019 0502   TRIG 510 (H) 12/20/2019 0502   HDL 39 (L) 12/20/2019 0502   CHOLHDL 5.2 12/20/2019 0502   VLDL UNABLE TO CALCULATE IF TRIGLYCERIDE OVER 400 mg/dL 12/20/2019 0502   LDLCALC UNABLE TO CALCULATE IF TRIGLYCERIDE OVER 400 mg/dL 12/20/2019 0502   LDLDIRECT 66.8 12/20/2019 0502    Physical Exam:    VS:  BP 100/74 (BP Location: Left Arm, Patient Position: Sitting, Cuff Size: Normal)   Pulse 84   Ht 5\' 6"  (1.676 m)   Wt 215 lb (97.5 kg)   SpO2 97%   BMI 34.70 kg/m     Wt Readings from Last 3 Encounters:  04/23/21 215 lb (97.5 kg)  04/08/21 224 lb 13.9 oz (102 kg)  03/21/21 225 lb 3.2 oz (102.2 kg)     GEN:  Well nourished, well developed in no acute distress.  Obese HEENT: Normal NECK: No JVD; No carotid bruits LYMPHATICS: No lymphadenopathy CARDIAC: RRR, no murmurs, rubs, gallops RESPIRATORY:  Clear to auscultation without rales, wheezing or rhonchi  ABDOMEN: Soft, non-tender,  non-distended MUSCULOSKELETAL:  No edema; No deformity  SKIN: Warm and dry NEUROLOGIC:  Alert and oriented x 3 PSYCHIATRIC:  Normal affect        ASSESSMENT:    1. Paroxysmal atrial fibrillation (HCC)   2. Essential hypertension   3. OSA (obstructive sleep apnea)    PLAN:    In order of problems listed above:  #Symptomatic persistent atrial fibrillation #Atrial flutter, typical  Symptomatic.  Patient would like to pursue a rhythm control strategy which I think is the best option for her as well.  She is currently on flecainide but this is not maintaining rhythm for her.  We went back over the various options including catheter ablation and she wishes to proceed with catheter ablation.  I discussed the procedure in detail include the risk, recovery and likelihood of success.  We discussed the possibility of needing antiarrhythmic drug therapy in the future or even a repeat ablation.  She would like to proceed with scheduling.  Risk, benefits, and alternatives to EP study and radiofrequency ablation for afib were also discussed in detail today. These risks include but are not limited to stroke, bleeding, vascular damage, tamponade, perforation, damage to the esophagus, lungs, and other structures, pulmonary vein stenosis, worsening renal function, and death. The patient understands these risk and wishes to proceed.  We will therefore proceed with catheter ablation at the next available time.  Carto, ICE, anesthesia are requested for the procedure.  Will also obtain CT PV protocol prior to the procedure to exclude LAA thrombus and further evaluate atrial anatomy.  Ablation strategy will be PVI plus posterior wall plus CTI.  #Hypertension Her blood pressures have been on the lower side.  We will decrease her losartan to 50 mg by mouth once daily.  She will continue her other medicines.  She will continue to check her blood pressures 1-2 times per week.  #Obstructive sleep apnea on  CPAP Encouraged continued use of CPAP.  Total time spent with patient today 45 minutes. This includes reviewing records, evaluating the patient and coordinating care.   Medication Adjustments/Labs and Tests Ordered: Current medicines are reviewed at length with the patient today.  Concerns regarding medicines are outlined above.  Orders Placed This Encounter  Procedures   EKG 12-Lead   No orders of the defined types were placed in this encounter.    Signed, Lars Mage, MD, [facility], [facility] 04/23/2021 2:50 PM    Electrophysiology [facility]

## 2021-04-24 ENCOUNTER — Ambulatory Visit: Payer: 59 | Admitting: Physician Assistant

## 2021-04-25 ENCOUNTER — Encounter: Payer: 59 | Admitting: Gastroenterology

## 2021-04-29 NOTE — Telephone Encounter (Signed)
Thanks for the follow up.  [place]lyn can you contact the patient and see how she is feeling? Her EGD and colonoscopy are nonurgent. If atrial fibrillation is active and pending an ablation it may be better to have that done first and do her procedures later, but not sure how long she would need to be on anticoagulation.   Dr. Quentin Ore, can you help clarify as this patient has a pending EGD and colonoscopy with [place] in January - sounds like not a great time to hold her Eliquis or have elective anesthesia for a procedure with recent atrial fibrillation issues. If she has an ablation, how long would it be until she would be able to safely hold anticoagulation for a procedure, assuming this works to control her AF? Thanks

## 2021-04-29 NOTE — Telephone Encounter (Signed)
Dr. Havery Moros, pt had cardiology evaluation on 11/30. I do not see that she was cleared for her procedures that are scheduled for 06/10/21. Please advise if we need to delay procedures again. Thanks

## 2021-04-30 NOTE — Telephone Encounter (Signed)
Lm on vm for patient to return call 

## 2021-05-02 ENCOUNTER — Encounter: Payer: Self-pay | Admitting: Cardiology

## 2021-05-02 MED ORDER — LOSARTAN POTASSIUM 25 MG PO TABS: ORAL_TABLET | ORAL | 3 refills | Status: DC

## 2021-05-02 MED ORDER — LOSARTAN POTASSIUM 50 MG PO TABS: ORAL_TABLET | ORAL | 3 refills | Status: DC

## 2021-05-04 NOTE — Telephone Encounter (Signed)
Thank you very much for clarifying.   [place] can you let the patient know if she has had recent issues with her atrial fibrillation it would be best to postpone her EGD and colonoscopy until after the ablation as these are not urgent exams. She would not be able to hold her anticoagulation for 3 months post ablation, so this would delay these exams a bit. If her symptoms are stable that would be the best way to approach this and cancel her exam in January. Can you let her know and cancel her exam if she is agreeable to this. I can see her back in the office in the interim if needed if she would like to discuss this further. Thanks

## 2021-05-05 NOTE — Telephone Encounter (Signed)
While I understand she has these symptoms, given her issues with atrial fibrillation in general would not recommend anesthesia until after she has recovered from her ablation given risks of anesthesia with uncontrolled AF. I would recommend EGD and colonoscopy after her recovery from ablation. Thanks

## 2021-05-05 NOTE — Telephone Encounter (Signed)
Called and spoke with patient. Pt states that she is OK with holding off on the colonoscopy, but she would still like to have the EGD. Pt reports that she feels like "there is a lump" in her throat. She denies any dysphagia. She states that it was previously offered that she can do EGD separately. Please advise, thanks.

## 2021-05-05 NOTE — Telephone Encounter (Signed)
Called and spoke with patient in regards to recommendations. Pt advised to call back in the interim. Pt is aware that we will place a recall in the system for her to be seen in June. Pt verbalized understanding and had no concerns at the end of the call.

## 2021-05-27 DIAGNOSIS — E785 Hyperlipidemia, unspecified: Secondary | ICD-10-CM | POA: Diagnosis not present

## 2021-05-27 DIAGNOSIS — R7301 Impaired fasting glucose: Secondary | ICD-10-CM | POA: Diagnosis not present

## 2021-05-27 DIAGNOSIS — E668 Other obesity: Secondary | ICD-10-CM | POA: Diagnosis not present

## 2021-05-27 DIAGNOSIS — I10 Essential (primary) hypertension: Secondary | ICD-10-CM | POA: Diagnosis not present

## 2021-05-29 ENCOUNTER — Encounter: Payer: Self-pay | Admitting: Gastroenterology

## 2021-05-29 ENCOUNTER — Other Ambulatory Visit: Payer: Self-pay

## 2021-05-29 MED ORDER — OMEPRAZOLE 20 MG PO CPDR: 20.0000 mg | DELAYED_RELEASE_CAPSULE | Freq: Two times a day (BID) | ORAL | 1 refills | Status: DC

## 2021-06-04 ENCOUNTER — Telehealth: Payer: Self-pay | Admitting: *Deleted

## 2021-06-04 DIAGNOSIS — I4891 Unspecified atrial fibrillation: Secondary | ICD-10-CM

## 2021-06-04 DIAGNOSIS — Z01818 Encounter for other preprocedural examination: Secondary | ICD-10-CM

## 2021-06-04 NOTE — Telephone Encounter (Signed)
Left message to call back  

## 2021-06-04 NOTE — Telephone Encounter (Signed)
-----   Message from Damian Leavell, RN sent at 04/23/2021  3:40 PM EST ----- Regarding: follow up in January Pt has been scheduled for July 31, 2021 at 7:30 am for afib ablation with Dr. Quentin Ore.  She is changing insurance in January.  Holding afib spot.  Need to contact in January to rediscuss and finish up the whole process.

## 2021-06-05 ENCOUNTER — Encounter: Payer: Self-pay | Admitting: Cardiology

## 2021-06-06 ENCOUNTER — Encounter: Payer: Self-pay | Admitting: Cardiology

## 2021-06-06 NOTE — Telephone Encounter (Signed)
See phone note

## 2021-06-06 NOTE — Telephone Encounter (Signed)
Spoke to patient and she has new insurance with card. Advised to come by the office or send in picture of new card over mychart so we can update information. Picked lab day. Would like to meet in person for instructions, therefore will schedule labs at chst office.  Patient verbalized understanding and agreement with read back of appt.

## 2021-06-09 ENCOUNTER — Encounter: Payer: Self-pay | Admitting: Gastroenterology

## 2021-06-09 DIAGNOSIS — K219 Gastro-esophageal reflux disease without esophagitis: Secondary | ICD-10-CM

## 2021-06-09 DIAGNOSIS — R0989 Other specified symptoms and signs involving the circulatory and respiratory systems: Secondary | ICD-10-CM

## 2021-06-10 ENCOUNTER — Encounter: Payer: 59 | Admitting: Gastroenterology

## 2021-06-10 DIAGNOSIS — L821 Other seborrheic keratosis: Secondary | ICD-10-CM | POA: Diagnosis not present

## 2021-06-10 DIAGNOSIS — D2271 Melanocytic nevi of right lower limb, including hip: Secondary | ICD-10-CM | POA: Diagnosis not present

## 2021-06-10 DIAGNOSIS — D2261 Melanocytic nevi of right upper limb, including shoulder: Secondary | ICD-10-CM | POA: Diagnosis not present

## 2021-06-10 DIAGNOSIS — L239 Allergic contact dermatitis, unspecified cause: Secondary | ICD-10-CM | POA: Diagnosis not present

## 2021-06-10 DIAGNOSIS — D225 Melanocytic nevi of trunk: Secondary | ICD-10-CM | POA: Diagnosis not present

## 2021-06-10 DIAGNOSIS — D2262 Melanocytic nevi of left upper limb, including shoulder: Secondary | ICD-10-CM | POA: Diagnosis not present

## 2021-06-10 DIAGNOSIS — L538 Other specified erythematous conditions: Secondary | ICD-10-CM | POA: Diagnosis not present

## 2021-06-10 DIAGNOSIS — L820 Inflamed seborrheic keratosis: Secondary | ICD-10-CM | POA: Diagnosis not present

## 2021-06-10 DIAGNOSIS — D2272 Melanocytic nevi of left lower limb, including hip: Secondary | ICD-10-CM | POA: Diagnosis not present

## 2021-06-10 MED ORDER — OMEPRAZOLE 40 MG PO CPDR: 40.0000 mg | DELAYED_RELEASE_CAPSULE | Freq: Two times a day (BID) | ORAL | 1 refills | Status: AC

## 2021-06-10 NOTE — Telephone Encounter (Signed)
Barium esophagram order in epic. Secure staff message sent to radiology scheduling to contact pt to set up her appt. New RX for Omeprazole 40 mg BID sent to patient's pharmacy on file. Pt notified of recommendations and information via my chart.

## 2021-06-10 NOTE — Addendum Note (Signed)
Addended by: Yevette Edwards on: 06/10/2021 11:31 AM   Modules accepted: Orders

## 2021-06-10 NOTE — Telephone Encounter (Signed)
[place] can you let the patient know I got her message.  Unfortunately her procedures have been delayed by her need for cardioversion for A. fib and now pending an ablation in March and per my last conversation with Dr. Quentin Ore, we will need to hold off on her procedures for 3 months after her ablation.  Given it may be a few more months until we can do her procedure and it may be reasonable to do a barium swallow with tablet to further evaluate things.  Can you help coordinate.  In the interim I think she is taking omeprazole 20 mg twice daily.  She can try increasing this to 40 mg twice daily for 2 weeks and see if any benefit on that regimen.  If symptoms persist despite high-dose omeprazole then we can switch to another regimen, but I do not believe she has tried high-dose yet.  Thanks

## 2021-06-13 ENCOUNTER — Encounter: Payer: Self-pay | Admitting: *Deleted

## 2021-06-13 NOTE — Progress Notes (Signed)
Cardiology Office Note    Date:  06/18/2021   ID:  [name] Ross, DOB [birth year], MRN [id number]  PCP:  Donnajean Lopes, MD  Cardiologist:  Kate Sable, MD  Electrophysiologist:  Vickie Epley, MD   Chief Complaint: Follow-up  History of Present Illness:   [name] Ross is a 56 y.o. female with history of PAF, SVT, HTN, HLD, obesity, OSA on CPAP, and GERD who presents for follow-up of A. fib.  She was admitted in 11/2019 with A. fib with RVR.  Echo in 11/2019 showed an EF of 60 to 65%, no regional wall motion abnormalities, normal LV diastolic function parameters, normal RV systolic function and ventricular cavity size, and trivial mitral regurgitation.  She was placed on anticoagulation along with carvedilol.  Subsequent office visit in 12/2019 documented sinus rhythm.  Due to recurrence of palpitations, outpatient cardiac monitoring in 07/2020 demonstrated a predominant rhythm of sinus with an average heart rate of 83 bpm (range 56 to 197 bpm), 148 episodes of SVT occurred with the longest lasting 13 beats, A. fib was noted with a less than 1% burden with the longest episode lasting 28 minutes and 17 seconds.  SVT and A. fib were detected with patient triggered events.  She was initiated on flecainide therapy in 06/2020 with continuation of carvedilol and diltiazem.  Treadmill MPI on 08/30/2020, demonstrated she was able to exercise for 5 minutes and was not able to achieve 85% MPHR, therefore was transitioned to [place].  Stress test was low risk overall.  CT attenuated corrected images showed no evidence of coronary artery calcifications or aortic atherosclerosis.  She has been followed by the A. fib clinic and EP.  She was admitted in 03/2021 for A. fib with RVR and subsequently cardioverted on 04/08/2021.  Echo during that admission demonstrated an EF of 60 to 65%, no regional wall motion abnormalities, mild asymmetric left ventricular hypertrophy of the basal septal segment,  indeterminate LV diastolic function parameters, normal RV systolic function and ventricular cavity size, and a trivial mitral regurgitation.  She was last evaluated by EP on 04/23/2021 with symptomatic A. fib.  She was on flecainide at that time, though this was not maintaining sinus rhythm for her.  She elected to proceed with A. fib ablation which is currently scheduled for 07/31/2021.  Due to relative hypotension, losartan was decreased from 100 mg to 50 mg daily.  She subsequently noted her BP began to increase leading losartan to be titrated to 75 mg.  She comes in today under increased stress.  She has also had to decrease her losartan back to 50 mg daily secondary to soft blood pressure with positional dizziness.  Her blood pressure is elevated in the office today, which has been attributed to significant work stress.  She does remain on apixaban, carvedilol, and flecainide without missing any doses.  No symptoms concerning for recurrence of A. fib.  She does note intermittent chest tightness that feels different than her typical GERD.  For the most part, her chest tightness is randomly occurring.  She is also being evaluated for dysphagia and had a barium swallow yesterday that was unrevealing.  She indicates that she may need an EGD moving forward.   Labs independently reviewed: 03/2021 - potassium 4.2, BUN 22, serum creatinine 0.86, magnesium 2.0, A1c 5.9, TSH normal, free T4 normal, Hgb 12.4, PLT 582, albumin 3.5, AST/ALT normal 11/2019 - direct LDL 66, TC 204, TG 510, HDL 39  Past Medical History:  Diagnosis Date   Anal skin tag    Atrial fibrillation (HCC)    Depression    Diverticulosis    Diverticulosis, sigmoid    Dyslipidemia    GERD (gastroesophageal reflux disease)    Hemorrhoids    internal and external   History of diverticulitis    10/ 2016   History of galactorrhea    History of stress test    a. Pt reports h/o stress test and echo by cardiologist in [place] (no records in  [place]).  Both reportedly nl.   Hypertension    IBS (irritable bowel syndrome)    Morbid obesity (HCC)    PONV (postoperative nausea and vomiting)    severe   Rash    elbows and behind knees   Restless leg syndrome    Sleep apnea    cpap sometimes   Wears glasses     Past Surgical History:  Procedure Laterality Date   BAND HEMORRHOIDECTOMY  2012   Dr Earlean Shawl   CARDIOVERSION N/A 04/08/2021   Procedure: CARDIOVERSION;  Surgeon: Nelva Bush, MD;  Location: [facility] ORS;  Service: Cardiovascular;  Laterality: N/A;   [place]   COLONOSCOPY  06/18/2011   CYSTO/  BILATERAL PYELOGRAM RETROGRADES/  TRANSOBTURATOR SLING  03/08/2006   EXCISION OF SKIN TAG N/A 02/13/2016   Procedure: ANAL SKIN TAG EXCISION;  Surgeon: [name], MD;  Location: [place];  Service: General;  Laterality: N/A;   LAPAROSCOPIC CHOLECYSTECTOMY  05/22/2001   SHOULDER SURGERY      Current Medications: Current Meds  Medication Sig   acetaminophen (TYLENOL) 500 MG tablet Take 1,000 mg by mouth every 6 (six) hours as needed for mild pain.   buPROPion (WELLBUTRIN XL) 300 MG 24 hr tablet Take 300 mg by mouth every morning.    butalbital-acetaminophen-caffeine (FIORICET) 50-325-40 MG tablet Take by mouth 2 (two) times daily as needed for headache.   carvedilol (COREG) 25 MG tablet Take 1.5 tablets (37.5 mg total) by mouth 2 (two) times daily.   dicyclomine (BENTYL) 10 MG capsule Take one tablet by mouth once to twice daily as needed   ELIQUIS 5 MG TABS tablet TAKE 1 TABLET BY MOUTH TWICE DAILY   escitalopram (LEXAPRO) 20 MG tablet Take 20 mg by mouth every morning.    estradiol (ESTRACE) 2 MG tablet Take 2 mg by mouth daily.   fenofibrate micronized (LOFIBRA) 200 MG capsule Take 200 mg by mouth daily before breakfast.    flecainide (TAMBOCOR) 50 MG tablet TAKE 1 TABLET BY MOUTH TWICE DAILY   fluticasone (FLONASE) 50 MCG/ACT nasal spray Place 1 spray into both nostrils daily.    HYDROcodone bit-homatropine (HYCODAN) 5-1.5 MG/5ML syrup Take 5 mLs by mouth every 6 (six) hours as needed for cough.   ibuprofen (ADVIL) 200 MG tablet Take 600-800 mg by mouth every 6 (six) hours as needed for moderate pain.   linaclotide (LINZESS) 72 MCG capsule Take 72 mcg by mouth daily as needed.   loratadine (ALAVERT) 10 MG dissolvable tablet Take 1 tablet by mouth daily.   losartan (COZAAR) 25 MG tablet Take 1.5 tablets (37.5 mg total) by mouth daily.   magnesium oxide (MAG-OX) 400 (240 Mg) MG tablet Take 1 tablet by mouth daily.   meclizine (ANTIVERT) 25 MG tablet Take 1 tablet (25 mg total) by mouth 3 (three) times daily as needed for dizziness.   metoprolol tartrate (LOPRESSOR) 100 MG tablet Take 1 tablet (100 mg total) by mouth once for 1  dose. Take 2 hours before your test.   mometasone (ELOCON) 0.1 % ointment Apply topically 2 (two) times daily as needed.   omega-3 acid ethyl esters (LOVAZA) 1 g capsule Take 1 capsule (1 g total) by mouth 2 (two) times daily.   omeprazole (PRILOSEC) 40 MG capsule Take 1 capsule (40 mg total) by mouth in the morning and at bedtime.   ondansetron (ZOFRAN-ODT) 4 MG disintegrating tablet Take 4 mg by mouth as directed.   OZEMPIC, 2 MG/DOSE, 8 MG/3ML SOPN Inject 2 mg into the skin once a week.   progesterone (PROMETRIUM) 200 MG capsule Take 200 mg by mouth at bedtime.   tacrolimus (PROTOPIC) 0.1 % ointment Apply 1 application topically 2 (two) times daily as needed. For psoriasis   triamcinolone (KENALOG) 0.1 % Apply 1 application topically 2 (two) times daily as needed.   triamterene-hydrochlorothiazide (MAXZIDE-25) 37.5-25 MG per tablet Take 0.5 tablets by mouth every morning.    valACYclovir (VALTREX) 1000 MG tablet Take 1,000 mg by mouth 2 (two) times daily as needed.    zolpidem (AMBIEN) 10 MG tablet Take 10 mg by mouth at bedtime as needed for sleep.   [DISCONTINUED] losartan (COZAAR) 50 MG tablet Take one tablet by mouth daily along with 25 mg dose  for total of 75 mg daily    Allergies:   Venlafaxine   Social History   Socioeconomic History   Marital status: Married    Spouse name: Doctor, general practice   Number of children: 2   Years of education: RN   Highest education level: Not on file  Occupational History   Occupation: Therapist, sports  Tobacco Use   Smoking status: Never   Smokeless tobacco: Never  Vaping Use   Vaping Use: Never used  Substance and Sexual Activity   Alcohol use: No    Alcohol/week: 0.0 standard drinks   Drug use: No   Sexual activity: Not on file  Other Topics Concern   Not on file  Social History Narrative   Lives in [place].  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  Occasionally drinks coffee but not a heavy caffeine drinker.     Social Determinants of Health   Financial Resource Strain: Not on file  Food Insecurity: Not on file  Transportation Needs: Not on file  Physical Activity: Not on file  Stress: Not on file  Social Connections: Not on file     Family History:  The patient's family history includes Atrial fibrillation in her mother; Breast cancer in an other family member; Colon cancer in her mother; Diabetes in her father, mother, sister, and sister; Hypertension in her father and mother; Skin cancer in her father; Suicidality in her father. There is no history of Sleep apnea.  ROS:   Review of Systems  Constitutional:  Positive for malaise/fatigue. Negative for chills, diaphoresis, fever and weight loss.  HENT:  Negative for congestion.   Eyes:  Negative for discharge and redness.  Respiratory:  Negative for cough, sputum production, shortness of breath and wheezing.   Cardiovascular:  Positive for chest pain. Negative for palpitations, orthopnea, claudication, leg swelling and PND.  Gastrointestinal:  Negative for abdominal pain, blood in stool, heartburn, melena, nausea and vomiting.  Musculoskeletal:  Negative for falls and myalgias.  Skin:  Negative for rash.  Neurological:  Negative  for dizziness, tingling, tremors, sensory change, speech change, focal weakness, loss of consciousness and weakness.  Endo/Heme/Allergies:  Does not bruise/bleed easily.  Psychiatric/Behavioral:  Negative for substance abuse.  The patient is not nervous/anxious.        Increased stress  All other systems reviewed and are negative.   EKGs/Labs/Other Studies Reviewed:    Studies reviewed were summarized above. The additional studies were reviewed today:  2D echo 04/07/2021: 1. Left ventricular ejection fraction, by estimation, is 60 to 65%. The  left ventricle has normal function. The left ventricle has no regional  wall motion abnormalities. There is mild asymmetric left ventricular  hypertrophy of the basal-septal segment.  Left ventricular diastolic parameters are indeterminate.   2. Right ventricular systolic function is normal. The right ventricular  size is normal. Tricuspid regurgitation signal is inadequate for assessing  PA pressure.   3. The mitral valve is normal in structure. Trivial mitral valve  regurgitation. No evidence of mitral stenosis.   4. The aortic valve is normal in structure. Aortic valve regurgitation is  not visualized. No aortic stenosis is present. __________  Carlton Adam MPI 08/30/2020: Blood pressure demonstrated a normal response to exercise. The patient exercised for 5 minutes and was not able to achieve 85% maximum predicted heart rate and thus switched to [place]. There was no ST segment deviation noted during stress. The study is normal. This is a low risk study. The left ventricular ejection fraction is normal (55-65%). CT attenuation images showed no evidence of aortic or coronary calcifications. __________  Elwyn Reach patch 06/2020: Patient had a min HR of 53 bpm, max HR of 197 bpm, and avg HR of 83 bpm. Predominant underlying rhythm was Sinus Rhythm. 148 Supraventricular Tachycardia runs occurred, the run with the fastest interval lasting 4 beats with a  max rate of 197 bpm, the  longest lasting 13 beats with an avg rate of 127 bpm. Atrial Fibrillation occurred (<1% burden), ranging from 74-166 bpm (avg of 112 bpm), the longest lasting 28 mins 17 secs with an avg rate of 113 bpm. Supraventricular Tachycardia and Atrial  Fibrillation were detected within +/- 45 seconds of symptomatic patient event(s). __________  2D echo 12/20/2019: 1. Left ventricular ejection fraction, by estimation, is 60 to 65%. The  left ventricle has normal function. The left ventricle has no regional  wall motion abnormalities. Left ventricular diastolic parameters were  normal.   2. Right ventricular systolic function is normal. The right ventricular  size is normal.   3. The mitral valve is normal in structure. Trivial mitral valve  regurgitation.   4. The aortic valve is normal in structure. Aortic valve regurgitation is  not visualized.  EKG:  EKG is ordered today.  The EKG ordered today demonstrates NSR, 87 bpm, low voltage QRS, no acute ST-T changes  Recent Labs: 04/05/2021: ALT 14; B Natriuretic Peptide 92.2 04/06/2021: Hemoglobin 12.4; Magnesium 2.0; Platelets 582; TSH 3.374 04/07/2021: BUN 22; Creatinine, Ser 0.86; Potassium 4.2; Sodium 137  Recent Lipid Panel    Component Value Date/Time   CHOL 204 (H) 12/20/2019 0502   TRIG 510 (H) 12/20/2019 0502   HDL 39 (L) 12/20/2019 0502   CHOLHDL 5.2 12/20/2019 0502   VLDL UNABLE TO CALCULATE IF TRIGLYCERIDE OVER 400 mg/dL 12/20/2019 0502   LDLCALC UNABLE TO CALCULATE IF TRIGLYCERIDE OVER 400 mg/dL 12/20/2019 0502   LDLDIRECT 66.8 12/20/2019 0502    PHYSICAL EXAM:    VS:  BP (!) 148/100 (BP Location: Left Arm, Patient Position: Sitting, Cuff Size: Large)    Pulse 87    Ht 5' 6.5" (1.689 m)    Wt 216 lb (98 kg)    SpO2 98%  BMI 34.34 kg/m   BMI: Body mass index is 34.34 kg/m.  Physical Exam Vitals reviewed.  Constitutional:      Appearance: She is well-developed.  HENT:     Head: Normocephalic and  atraumatic.  Eyes:     General:        Right eye: No discharge.        Left eye: No discharge.  Neck:     Vascular: No JVD.  Cardiovascular:     Rate and Rhythm: Normal rate and regular rhythm.     Pulses:          Posterior tibial pulses are 2+ on the right side and 2+ on the left side.     Heart sounds: Normal heart sounds, S1 normal and S2 normal. Heart sounds not distant. No midsystolic click and no opening snap. No murmur heard.   No friction rub.  Pulmonary:     Effort: Pulmonary effort is normal. No respiratory distress.     Breath sounds: Normal breath sounds. No decreased breath sounds, wheezing or rales.  Chest:     Chest wall: No tenderness.  Abdominal:     General: There is no distension.     Palpations: Abdomen is soft.     Tenderness: There is no abdominal tenderness.  Musculoskeletal:     Cervical back: Normal range of motion.     Right lower leg: No edema.     Left lower leg: No edema.  Skin:    General: Skin is warm and dry.     Nails: There is no clubbing.  Neurological:     Mental Status: She is alert and oriented to person, place, and time.  Psychiatric:        Speech: Speech normal.        Behavior: Behavior normal.        Thought Content: Thought content normal.        Judgment: Judgment normal.    Wt Readings from Last 3 Encounters:  06/18/21 216 lb (98 kg)  06/16/21 213 lb 6.4 oz (96.8 kg)  04/23/21 215 lb (97.5 kg)     ASSESSMENT & PLAN:   Precordial pain: Currently chest pain-free.  Schedule coronary CTA to coincide with pre-A. fib ablation imaging.  She remains on apixaban place of aspirin to minimize bleeding risk.  PAF: Maintaining sinus rhythm.  She remains on carvedilol and flecainide under the direction of EP.  Given a CHA2DS2-VASc of at least (HTN, sex category) she remains on apixaban.  EP is planning for catheter ablation on 07/31/2021.  Follow-up as directed.  Paroxysmal SVT: Quiescent.  Carvedilol as outlined above.  HTN: Blood  pressure is elevated in the office today, though she has been under significant work related stress today.  She has had issues with soft blood pressure and positional dizziness on both 50 and 75 mg of losartan.  Decrease losartan to 37.5 mg daily.  HLD: LDL 66 in 11/2019.  Not currently requiring statin therapy.  OSA: Remains on CPAP with excellent adherence.  Followed by [facility].    Disposition: F/u with Dr. [name] or an APP in 3 to 4 months, and EP as directed.   Medication Adjustments/Labs and Tests Ordered: Current medicines are reviewed at length with the patient today.  Concerns regarding medicines are outlined above. Medication changes, Labs and Tests ordered today are summarized above and listed in the Patient Instructions accessible in Encounters.   SignedChristell Faith, PA-C 06/18/2021 4:13 PM  [address] [place], [place] [address] [phone]

## 2021-06-16 ENCOUNTER — Encounter: Payer: Self-pay | Admitting: Adult Health

## 2021-06-16 ENCOUNTER — Ambulatory Visit: Payer: 59 | Admitting: Adult Health

## 2021-06-16 ENCOUNTER — Other Ambulatory Visit: Payer: Self-pay

## 2021-06-16 VITALS — BP 102/70 | HR 80 | Ht 66.0 in | Wt 213.4 lb

## 2021-06-16 DIAGNOSIS — G4733 Obstructive sleep apnea (adult) (pediatric): Secondary | ICD-10-CM | POA: Diagnosis not present

## 2021-06-16 DIAGNOSIS — Z9989 Dependence on other enabling machines and devices: Secondary | ICD-10-CM | POA: Diagnosis not present

## 2021-06-16 NOTE — Progress Notes (Signed)
PATIENT: [name] Ross DOB: 05-Aug-1965  REASON FOR VISIT: follow up HISTORY FROM: patient PRIMARY NEUROLOGIST: Dr. Rexene Alberts  HISTORY OF PRESENT ILLNESS: Today 06/16/21  Ms. Donahoe is a 56 year old female with a history of obstructive sleep apnea on CPAP.  She returns today for follow-up.  Reports that the CPAP is working well for her.  She does state that her machine states that it has exceeded its life.  She is curious about a new machine.  She returns today for follow-up.    REVIEW OF SYSTEMS: Out of a complete 14 system review of symptoms, the patient complains only of the following symptoms, and all other reviewed systems are negative.  ESS 4  ALLERGIES: Allergies  Allergen Reactions   Venlafaxine     Other reaction(s): sleepy    HOME MEDICATIONS: Outpatient Medications Prior to Visit  Medication Sig Dispense Refill   acetaminophen (TYLENOL) 500 MG tablet Take 1,000 mg by mouth every 6 (six) hours as needed for mild pain.     buPROPion (WELLBUTRIN XL) 300 MG 24 hr tablet Take 300 mg by mouth every morning.      butalbital-acetaminophen-caffeine (FIORICET) 50-325-40 MG tablet Take by mouth 2 (two) times daily as needed for headache.     carvedilol (COREG) 25 MG tablet Take 1.5 tablets (37.5 mg total) by mouth 2 (two) times daily. 90 tablet 3   dicyclomine (BENTYL) 10 MG capsule Take one tablet by mouth once to twice daily as needed 90 capsule 3   ELIQUIS 5 MG TABS tablet TAKE 1 TABLET BY MOUTH TWICE DAILY 60 tablet 9   escitalopram (LEXAPRO) 20 MG tablet Take 20 mg by mouth every morning.      estradiol (ESTRACE) 2 MG tablet Take 2 mg by mouth daily.     fenofibrate micronized (LOFIBRA) 200 MG capsule Take 200 mg by mouth daily before breakfast.   4   flecainide (TAMBOCOR) 50 MG tablet TAKE 1 TABLET BY MOUTH TWICE DAILY 180 tablet 2   fluticasone (FLONASE) 50 MCG/ACT nasal spray Place 1 spray into both nostrils daily.     HYDROcodone bit-homatropine (HYCODAN) 5-1.5 MG/5ML  syrup Take 5 mLs by mouth every 6 (six) hours as needed for cough. 100 mL 0   ibuprofen (ADVIL) 200 MG tablet Take 600-800 mg by mouth every 6 (six) hours as needed for moderate pain.     linaclotide (LINZESS) 72 MCG capsule Take 72 mcg by mouth daily as needed.     loratadine (ALAVERT) 10 MG dissolvable tablet Take 1 tablet by mouth daily.     losartan (COZAAR) 25 MG tablet Take one tablet daily along with losartan 50 mg dose for total of 75 mg daily. 90 tablet 3   losartan (COZAAR) 50 MG tablet Take one tablet by mouth daily along with 25 mg dose for total of 75 mg daily 90 tablet 3   magnesium oxide (MAG-OX) 400 (240 Mg) MG tablet Take 1 tablet by mouth daily.     meclizine (ANTIVERT) 25 MG tablet Take 1 tablet (25 mg total) by mouth 3 (three) times daily as needed for dizziness. 30 tablet 0   mometasone (ELOCON) 0.1 % ointment Apply topically 2 (two) times daily as needed.     omega-3 acid ethyl esters (LOVAZA) 1 g capsule Take 1 capsule (1 g total) by mouth 2 (two) times daily. 60 capsule 5   omeprazole (PRILOSEC) 40 MG capsule Take 1 capsule (40 mg total) by mouth in the morning and at  bedtime. 60 capsule 1   ondansetron (ZOFRAN-ODT) 4 MG disintegrating tablet Take 4 mg by mouth as directed.     OZEMPIC, 2 MG/DOSE, 8 MG/3ML SOPN Inject 2 mg into the skin once a week.     progesterone (PROMETRIUM) 200 MG capsule Take 200 mg by mouth at bedtime.     tacrolimus (PROTOPIC) 0.1 % ointment Apply 1 application topically 2 (two) times daily as needed. For psoriasis     triamcinolone (KENALOG) 0.1 % Apply 1 application topically 2 (two) times daily as needed.     triamterene-hydrochlorothiazide (MAXZIDE-25) 37.5-25 MG per tablet Take 0.5 tablets by mouth every morning.      valACYclovir (VALTREX) 1000 MG tablet Take 1,000 mg by mouth 2 (two) times daily as needed.      zolpidem (AMBIEN) 10 MG tablet Take 10 mg by mouth at bedtime as needed for sleep.     No facility-administered medications prior to  visit.    PAST MEDICAL HISTORY: Past Medical History:  Diagnosis Date   Anal skin tag    Atrial fibrillation ([place])    Depression    Diverticulosis    Diverticulosis, sigmoid    Dyslipidemia    GERD (gastroesophageal reflux disease)    Hemorrhoids    internal and external   History of diverticulitis    10/ 2016   History of galactorrhea    History of stress test    a. Pt reports h/o stress test and echo by cardiologist in [place] (no records in [place]).  Both reportedly nl.   Hypertension    IBS (irritable bowel syndrome)    Morbid obesity (HCC)    PONV (postoperative nausea and vomiting)    severe   Rash    elbows and behind knees   Restless leg syndrome    Sleep apnea    cpap sometimes   Wears glasses     PAST SURGICAL HISTORY: Past Surgical History:  Procedure Laterality Date   BAND HEMORRHOIDECTOMY  2012   Dr Earlean Shawl   CARDIOVERSION N/A 04/08/2021   Procedure: CARDIOVERSION;  Surgeon: Nelva Bush, MD;  Location: [facility];  Service: Cardiovascular;  Laterality: N/A;   [place]   COLONOSCOPY  06/18/2011   CYSTO/  BILATERAL PYELOGRAM RETROGRADES/  TRANSOBTURATOR SLING  03/08/2006   EXCISION OF SKIN TAG N/A 02/13/2016   Procedure: ANAL SKIN TAG EXCISION;  Surgeon: [name], MD;  Location: [facility];  Service: General;  Laterality: N/A;   LAPAROSCOPIC CHOLECYSTECTOMY  05/22/2001   SHOULDER SURGERY      FAMILY HISTORY: Family History  Problem Relation Age of Onset   Diabetes Mother    Hypertension Mother    Atrial fibrillation Mother    Colon cancer Mother    Diabetes Father        committed suicide   Skin cancer Father    Suicidality Father    Hypertension Father    Diabetes Sister    Diabetes Sister    Breast cancer Other        materanl great aunt   Sleep apnea Neg Hx     SOCIAL HISTORY: Social History   Socioeconomic History   Marital status: Married    Spouse name: [name]   Number of children: 2   Years of  education: RN   Highest education level: Not on file  Occupational History   Occupation: RN  Tobacco Use   Smoking status: Never   Smokeless tobacco: Never  Vaping Use  Vaping Use: Never used  Substance and Sexual Activity   Alcohol use: No    Alcohol/week: 0.0 standard drinks   Drug use: No   Sexual activity: Not on file  Other Topics Concern   Not on file  Social History Narrative   Lives in [place].  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  Occasionally drinks coffee but not a heavy caffeine drinker.     Social Determinants of Health   Financial Resource Strain: Not on file  Food Insecurity: Not on file  Transportation Needs: Not on file  Physical Activity: Not on file  Stress: Not on file  Social Connections: Not on file  Intimate Partner Violence: Not on file      PHYSICAL EXAM  Vitals:   06/16/21 1047  BP: 102/70  Pulse: 80  Weight: 213 lb 6.4 oz (96.8 kg)  Height: 5\' 6"  (1.676 m)   Body mass index is 34.44 kg/m.  Generalized: Well developed, in no acute distress  Chest: Lungs clear to auscultation bilaterally  Neurological examination  Mentation: Alert oriented to time, place, history taking. Follows all commands speech and language fluent Cranial nerve II-XII: Extraocular movements were full, visual field were full on confrontational test Head turning and shoulder shrug  were normal and symmetric. Motor: The motor testing reveals 5 over 5 strength of all 4 extremities. Good symmetric motor tone is noted throughout.  Sensory: Sensory testing is intact to soft touch on all 4 extremities. No evidence of extinction is noted.  Gait and station: Gait is normal.    DIAGNOSTIC DATA (LABS, IMAGING, TESTING) - I reviewed patient records, labs, notes, testing and imaging myself where available.  Lab Results  Component Value Date   WBC 11.7 (H) 04/06/2021   HGB 12.4 04/06/2021   HCT 36.4 04/06/2021   MCV 86.1 04/06/2021   PLT 582 (H)  04/06/2021      Component Value Date/Time   NA 137 04/07/2021 0405   K 4.2 04/07/2021 0405   CL 106 04/07/2021 0405   CO2 23 04/07/2021 0405   GLUCOSE 105 (H) 04/07/2021 0405   BUN 22 (H) 04/07/2021 0405   CREATININE 0.86 04/07/2021 0405   CALCIUM 8.9 04/07/2021 0405   PROT 7.7 04/05/2021 1912   ALBUMIN 3.5 04/05/2021 1912   AST 22 04/05/2021 1912   ALT 14 04/05/2021 1912   ALKPHOS 32 (L) 04/05/2021 1912   BILITOT 0.7 04/05/2021 1912   GFRNONAA >60 04/07/2021 0405   GFRAA >60 12/20/2019 0502   Lab Results  Component Value Date   CHOL 204 (H) 12/20/2019   HDL 39 (L) 12/20/2019   LDLCALC UNABLE TO CALCULATE IF TRIGLYCERIDE OVER 400 mg/dL 12/20/2019   LDLDIRECT 66.8 12/20/2019   TRIG 510 (H) 12/20/2019   CHOLHDL 5.2 12/20/2019   Lab Results  Component Value Date   HGBA1C 5.9 (H) 04/06/2021   No results found for: [id number] Lab Results  Component Value Date   TSH 3.374 04/06/2021      ASSESSMENT AND PLAN 56 y.o. year old female  has a past medical history of Anal skin tag, Atrial fibrillation ([place]), Depression, Diverticulosis, Diverticulosis, sigmoid, Dyslipidemia, GERD (gastroesophageal reflux disease), Hemorrhoids, History of diverticulitis, History of galactorrhea, History of stress test, Hypertension, IBS (irritable bowel syndrome), Morbid obesity ([place]), PONV (postoperative nausea and vomiting), Rash, Restless leg syndrome, Sleep apnea, and Wears glasses. here with:  OSA on CPAP  - CPAP compliance excellent - Good treatment of AHI  - Encourage patient to  use CPAP nightly and > 4 hours each night -We will repeat home sleep test pending results we will order new machine - F/U in 1 year or sooner if needed   Ward Givens, MSN, NP-C 06/16/2021, 11:04 AM [facility] [address] [place], [place] [address] [phone]

## 2021-06-16 NOTE — Patient Instructions (Addendum)
Your Plan:  Continue CPAP Repeat home sleep test- pending results will order new machine  Thank you for coming to see [place] at [facility]. I hope we have been able to provide you high quality care today.  You may receive a patient satisfaction survey over the next few weeks. We would appreciate your feedback and comments so that we may continue to improve ourselves and the health of our patients.

## 2021-06-17 ENCOUNTER — Ambulatory Visit
Admission: RE | Admit: 2021-06-17 | Discharge: 2021-06-17 | Disposition: A | Discharge status: Home / Self Care | Payer: 59 | Source: Ambulatory Visit | Attending: Gastroenterology | Admitting: Gastroenterology

## 2021-06-17 ENCOUNTER — Other Ambulatory Visit: Payer: Self-pay

## 2021-06-17 DIAGNOSIS — K219 Gastro-esophageal reflux disease without esophagitis: Secondary | ICD-10-CM | POA: Diagnosis not present

## 2021-06-17 DIAGNOSIS — R0989 Other specified symptoms and signs involving the circulatory and respiratory systems: Secondary | ICD-10-CM | POA: Insufficient documentation

## 2021-06-18 ENCOUNTER — Encounter: Payer: Self-pay | Admitting: Physician Assistant

## 2021-06-18 ENCOUNTER — Ambulatory Visit: Payer: 59 | Admitting: Physician Assistant

## 2021-06-18 VITALS — BP 148/100 | HR 87 | Ht 66.5 in | Wt 216.0 lb

## 2021-06-18 DIAGNOSIS — R072 Precordial pain: Secondary | ICD-10-CM | POA: Diagnosis not present

## 2021-06-18 DIAGNOSIS — G4733 Obstructive sleep apnea (adult) (pediatric): Secondary | ICD-10-CM | POA: Diagnosis not present

## 2021-06-18 DIAGNOSIS — I10 Essential (primary) hypertension: Secondary | ICD-10-CM

## 2021-06-18 DIAGNOSIS — I471 Supraventricular tachycardia: Secondary | ICD-10-CM | POA: Diagnosis not present

## 2021-06-18 DIAGNOSIS — Z9989 Dependence on other enabling machines and devices: Secondary | ICD-10-CM | POA: Diagnosis not present

## 2021-06-18 DIAGNOSIS — I480 Paroxysmal atrial fibrillation: Secondary | ICD-10-CM | POA: Diagnosis not present

## 2021-06-18 DIAGNOSIS — E7849 Other hyperlipidemia: Secondary | ICD-10-CM

## 2021-06-18 DIAGNOSIS — K219 Gastro-esophageal reflux disease without esophagitis: Secondary | ICD-10-CM | POA: Diagnosis not present

## 2021-06-18 DIAGNOSIS — R0989 Other specified symptoms and signs involving the circulatory and respiratory systems: Secondary | ICD-10-CM | POA: Diagnosis not present

## 2021-06-18 MED ORDER — METOPROLOL TARTRATE 100 MG PO TABS: 100.0000 mg | ORAL_TABLET | Freq: Once | ORAL | 0 refills | Status: DC

## 2021-06-18 MED ORDER — LOSARTAN POTASSIUM 25 MG PO TABS: 37.5000 mg | ORAL_TABLET | Freq: Every day | ORAL | 6 refills | Status: DC

## 2021-06-18 NOTE — Patient Instructions (Signed)
Medication Instructions:  Your physician has recommended you make the following change in your medication:   START Losartan 37.5 mg once daily  *If you need a refill on your cardiac medications before your next appointment, please call your pharmacy*   Lab Work: None  If you have labs (blood work) drawn today and your tests are completely normal, you will receive your results only by: [place] (if you have MyChart) OR A paper copy in the mail If you have any lab test that is abnormal or we need to change your treatment, we will call you to review the results.   Testing/Procedures:   Your cardiac CT is scheduled at the below location:   [facility] [place], [place] [address] [phone]  Scheduled 06/30/21 at 10:15 am  Please arrive 15 mins early for check-in and test prep.  Please follow these instructions carefully (unless otherwise directed):  Hold all erectile dysfunction medications at least 3 days (72 hrs) prior to test.  On the Night Before the Test: Be sure to Drink plenty of water. Do not consume any caffeinated/decaffeinated beverages or chocolate 12 hours prior to your test. Do not take any antihistamines 12 hours prior to your test.  On the Day of the Test: Drink plenty of water until 1 hour prior to the test. Do not eat any food 4 hours prior to the test. You may take your regular medications prior to the test.  Take metoprolol (Lopressor) 100 mg two hours prior to test. HOLD Furosemide/Hydrochlorothiazide morning of the test. FEMALES- please wear underwire-free bra if available, avoid dresses & tight clothing       After the Test: Drink plenty of water. After receiving IV contrast, you may experience a mild flushed feeling. This is normal. On occasion, you may experience a mild rash up to 24 hours after the test. This is not dangerous. If this occurs, you can take Benadryl 25 mg and  increase your fluid intake. If you experience trouble breathing, this can be serious. If it is severe call 911 IMMEDIATELY. If it is mild, please call our office. If you take any of these medications: Glipizide/Metformin, Avandament, Glucavance, please do not take 48 hours after completing test unless otherwise instructed.  We will call to schedule your test 2-4 weeks out understanding that some insurance companies will need an authorization prior to the service being performed.   For non-scheduling related questions, please contact the cardiac imaging nurse navigator should you have any questions/concerns: Marchia Bond, Cardiac Imaging Nurse Navigator Gordy Clement, Cardiac Imaging Nurse Navigator [facility] Direct Office Dial: [phone]   For scheduling needs, including cancellations and rescheduling, please call [place], [phone].    Follow-Up: At [facility], you and your health needs are our priority.  As part of our continuing mission to provide you with exceptional heart care, we have created designated Provider Care Teams.  These Care Teams include your primary Cardiologist (physician) and Advanced Practice Providers (APPs -  Physician Assistants and Nurse Practitioners) who all work together to provide you with the care you need, when you need it.   Your next appointment:   4 month(s)  The format for your next appointment:   In Person  Provider:   Kate Sable, MD or [name], PA-C

## 2021-06-19 ENCOUNTER — Telehealth: Payer: Self-pay | Admitting: Cardiology

## 2021-06-19 NOTE — Telephone Encounter (Signed)
Spoke with patient and she reports they called and rescheduled. Reviewed to continue monitoring blood pressures and to call back if they persistently remain greater than 140/80. Advised that once she has the scan we will call her with those results when possible. She was very appreciative for the call back with no further questions at this time.

## 2021-06-19 NOTE — Telephone Encounter (Signed)
Appreciate the update on blood pressure.  Suspect stress is contributing to her elevated reading this morning.  We will await coronary CTA and continue to coordinate care with EP.

## 2021-06-19 NOTE — Telephone Encounter (Signed)
Pt c/o BP issue: STAT if pt c/o blurred vision, one-sided weakness or slurred speech  1. What are your last 5 BP readings? 144/97/, 127/99  2. Are you having any other symptoms (ex. Dizziness, headache, blurred vision, passed out)? no  3. What is your BP issue? stressed

## 2021-06-19 NOTE — Telephone Encounter (Signed)
Spoke with patient and she has experienced a very stressful situation. Yesterday she used valet and there was a large sum of money in her car. After her appointment it was discovered that her cash was gone. This involved police, hospital administration, and has caused her some distress. This morning her blood pressure before medications was 144/97. She then took her medication and it came down to 127/99. Discussed her readings and that her stress most certainly could have been the cause of that elevated reading. Her most immediate concern is that she was under the impression the CCTA was ordered because of her chest pain and it was to evaluate that before her ablation. Explained the reason for the changes but she was very concerned and wanted to know if she could have it done sooner. Provided her with reassurance and emotional support. Discussed with provider with request to send secure chat for EP input. Dr. Quentin Ore was agreeable with doing this sooner than ablation given her symptoms. Provider also states her blood pressure is actually better than when she was here at her visit. That reading was 148/100 and now it is 144/97 before medications. After meds it came down appropriately. Advised that I would give her a call back once discussed with everyone for further recommendations. She verbalized understanding with no further questions at this time.   Before closing this note Imaging replied that they will reach out to arrange sooner appointment. Will call patient back to review the changes and recommendations.

## 2021-06-27 ENCOUNTER — Ambulatory Visit: Payer: 59 | Admitting: Physician Assistant

## 2021-06-30 ENCOUNTER — Telehealth: Payer: Self-pay

## 2021-06-30 ENCOUNTER — Ambulatory Visit: Payer: 59 | Admitting: Neurology

## 2021-06-30 ENCOUNTER — Other Ambulatory Visit (HOSPITAL_COMMUNITY): Payer: Self-pay

## 2021-06-30 DIAGNOSIS — G4733 Obstructive sleep apnea (adult) (pediatric): Secondary | ICD-10-CM | POA: Diagnosis not present

## 2021-06-30 MED ORDER — WEGOVY 2.4 MG/0.75ML ~~LOC~~ SOAJ
2.4000 mg | SUBCUTANEOUS | 6 refills | Status: DC
  Filled 2021-06-30 – 2021-08-20 (×6): qty 3, 28d supply, fill #0

## 2021-06-30 NOTE — Telephone Encounter (Signed)
PA submitted via CoverMyMeds key: [id number].  APPROVED for omeprazole 40 mg BID PA Case ID: [id number] - Rx #: [id number]

## 2021-07-01 ENCOUNTER — Telehealth (HOSPITAL_COMMUNITY): Payer: Self-pay | Admitting: Emergency Medicine

## 2021-07-01 NOTE — Progress Notes (Signed)
° °  [facility]  HOME SLEEP TEST (Watch PAT) REPORT  STUDY DATE: 06/30/2021  DOB: July 11, 1965  MRN: [id number]  ORDERING CLINICIAN: [name], MD, PhD   REFERRING CLINICIAN: Ward Givens, NP  CLINICAL INFORMATION/HISTORY: 56 year old woman with a history of atrial fibrillation, obesity, hypertension, irritable bowel syndrome, restless leg syndrome, depression, diverticulosis, and sleep apnea, who presents for reevaluation of her obstructive sleep apnea.  She has been on CPAP therapy and would qualify for new equipment.  She has been compliant with treatment.  BMI: 34.4 kg/m  FINDINGS:   Sleep Summary:   Total Recording Time (hours, min): 9 hours, 52 minutes  Total Sleep Time (hours, min):  7 hours, 25 minutes   Percent REM (%):    15%   Respiratory Indices:   Calculated pAHI (per hour):  50.8/hour         REM pAHI:    65.6/hour       NREM pAHI: 48.2/hour  Oxygen Saturation Statistics:    Oxygen Saturation (%) Mean: 93%   Minimum oxygen saturation (%):                 78%   O2 Saturation Range (%): 78-99%    O2 Saturation (minutes) <=88%: 5.1 min  Pulse Rate Statistics:   Pulse Mean (bpm):    86/min    Pulse Range (56-142/min)   IMPRESSION: OSA (obstructive sleep apnea), severe  RECOMMENDATION:  This home sleep test demonstrates severe obstructive sleep apnea with a total AHI of 50.8/hour and O2 nadir of 78%.  Moderate to loud snoring was detected.  Ongoing treatment with positive airway pressure is highly recommended.  The patient will be issued a new CPAP machine.  Current pressure of 10 cm with EPR of 3 has resulted in good apnea control.  A laboratory attended titration study can be considered in the future for optimization of her treatment and better tolerance of therapy, if the need arises.  Alternative treatment options are limited secondary to the severity of the patient's sleep disordered breathing.  Concomitant weight loss is  recommended.  Please note, that untreated obstructive sleep apnea may carry additional perioperative morbidity. Patients with significant obstructive sleep apnea should receive perioperative PAP therapy and the surgeons and particularly the anesthesiologist should be informed of the diagnosis and the severity of the sleep disordered breathing. The patient should be cautioned not to drive, work at heights, or operate dangerous or heavy equipment when tired or sleepy. Review and reiteration of good sleep hygiene measures should be pursued with any patient. Other causes of the patient's symptoms, including circadian rhythm disturbances, an underlying mood disorder, medication effect and/or an underlying medical problem cannot be ruled out based on this test. Clinical correlation is recommended. The patient and her referring provider will be notified of the test results. The patient will be seen in follow up in sleep clinic at [facility].  I certify that I have reviewed the raw data recording prior to the issuance of this report in accordance with the standards of the American Academy of Sleep Medicine (AASM).  INTERPRETING PHYSICIAN:   [name], MD, PhD  Board Certified in Neurology and Sleep Medicine  [facility] [address], [place], [place] [address] [phone]

## 2021-07-01 NOTE — Telephone Encounter (Signed)
Reaching out to patient to offer assistance regarding upcoming cardiac imaging study; pt verbalizes understanding of appt date/time, parking situation and where to check in, pre-test NPO status and medications ordered, and verified current allergies; name and call back number provided for further questions should they arise Marchia Bond RN Navigator Cardiac Imaging [facility] [phone] office [phone] cell  Denies iv issues 100mg  metoprolol tartrate  Arrival 1000

## 2021-07-01 NOTE — Telephone Encounter (Signed)
Attempted to call patient regarding upcoming cardiac CT appointment. °Left message on voicemail with name and callback number °Brexley Cutshaw RN Navigator Cardiac Imaging °[facility] °[phone] Office °[phone] Cell ° °

## 2021-07-02 NOTE — Procedures (Signed)
° °  [facility]  HOME SLEEP TEST (Watch PAT) REPORT  STUDY DATE: 06/30/2021  DOB: April 03, 1966  MRN: [id number]  ORDERING CLINICIAN: [name], MD, PhD   REFERRING CLINICIAN: Ward Givens, NP  CLINICAL INFORMATION/HISTORY: 56 year old woman with a history of atrial fibrillation, obesity, hypertension, irritable bowel syndrome, restless leg syndrome, depression, diverticulosis, and sleep apnea, who presents for reevaluation of her obstructive sleep apnea.  She has been on CPAP therapy and would qualify for new equipment.  She has been compliant with treatment.  BMI: 34.4 kg/m  FINDINGS:   Sleep Summary:   Total Recording Time (hours, min): 9 hours, 52 minutes  Total Sleep Time (hours, min):  7 hours, 25 minutes   Percent REM (%):    15%   Respiratory Indices:   Calculated pAHI (per hour):  50.8/hour         REM pAHI:    65.6/hour       NREM pAHI: 48.2/hour  Oxygen Saturation Statistics:    Oxygen Saturation (%) Mean: 93%   Minimum oxygen saturation (%):                 78%   O2 Saturation Range (%): 78-99%    O2 Saturation (minutes) <=88%: 5.1 min  Pulse Rate Statistics:   Pulse Mean (bpm):    86/min    Pulse Range (56-142/min)   IMPRESSION: OSA (obstructive sleep apnea), severe  RECOMMENDATION:  This home sleep test demonstrates severe obstructive sleep apnea with a total AHI of 50.8/hour and O2 nadir of 78%.  Moderate to loud snoring was detected.  Ongoing treatment with positive airway pressure is highly recommended.  The patient will be issued a new CPAP machine.  Current pressure of 10 cm with EPR of 3 has resulted in good apnea control.  A laboratory attended titration study can be considered in the future for optimization of her treatment and better tolerance of therapy, if the need arises.  Alternative treatment options are limited secondary to the severity of the patient's sleep disordered breathing.  Concomitant weight loss is  recommended.  Please note, that untreated obstructive sleep apnea may carry additional perioperative morbidity. Patients with significant obstructive sleep apnea should receive perioperative PAP therapy and the surgeons and particularly the anesthesiologist should be informed of the diagnosis and the severity of the sleep disordered breathing. The patient should be cautioned not to drive, work at heights, or operate dangerous or heavy equipment when tired or sleepy. Review and reiteration of good sleep hygiene measures should be pursued with any patient. Other causes of the patient's symptoms, including circadian rhythm disturbances, an underlying mood disorder, medication effect and/or an underlying medical problem cannot be ruled out based on this test. Clinical correlation is recommended. The patient and her referring provider will be notified of the test results. The patient will be seen in follow up in sleep clinic at [facility].  I certify that I have reviewed the raw data recording prior to the issuance of this report in accordance with the standards of the American Academy of Sleep Medicine (AASM).  INTERPRETING PHYSICIAN:   [name], MD, PhD  Board Certified in Neurology and Sleep Medicine  [facility] [address] [place], [place] [address] [phone]

## 2021-07-03 ENCOUNTER — Telehealth: Payer: Self-pay | Admitting: Adult Health

## 2021-07-03 ENCOUNTER — Ambulatory Visit: Admission: RE | Admit: 2021-07-03 | Payer: 59 | Source: Ambulatory Visit

## 2021-07-03 DIAGNOSIS — Z1152 Encounter for screening for COVID-19: Secondary | ICD-10-CM | POA: Diagnosis not present

## 2021-07-03 DIAGNOSIS — G4733 Obstructive sleep apnea (adult) (pediatric): Secondary | ICD-10-CM

## 2021-07-03 DIAGNOSIS — J069 Acute upper respiratory infection, unspecified: Secondary | ICD-10-CM | POA: Diagnosis not present

## 2021-07-03 DIAGNOSIS — R112 Nausea with vomiting, unspecified: Secondary | ICD-10-CM | POA: Diagnosis not present

## 2021-07-03 DIAGNOSIS — R5383 Other fatigue: Secondary | ICD-10-CM | POA: Diagnosis not present

## 2021-07-03 DIAGNOSIS — R0981 Nasal congestion: Secondary | ICD-10-CM | POA: Diagnosis not present

## 2021-07-03 NOTE — Telephone Encounter (Signed)
HST confirms severe sleep apnea.  Order for new machine has been placed

## 2021-07-03 NOTE — Telephone Encounter (Signed)
I called pt. No answer, left a message asking pt to call me back.   

## 2021-07-04 ENCOUNTER — Other Ambulatory Visit (HOSPITAL_COMMUNITY): Payer: Self-pay

## 2021-07-08 ENCOUNTER — Encounter: Payer: Self-pay | Admitting: *Deleted

## 2021-07-08 NOTE — Telephone Encounter (Addendum)
LMVM for pt that have left message and also mychart message concerning results of sleep study.  Please return call or read email.  Mailed letter to pt about results and new order.  Sent message to [facility] concerning new order.

## 2021-07-09 NOTE — Telephone Encounter (Signed)
Denyse Amass, RN; Vanessa Ralphs got it!      Previous Messages   ----- Message -----  From: Brandon Melnick, RN  Sent: 07/08/2021   4:08 PM EST  To: Ocie Bob, *   New order in Epic for this pt [name] Ross. [name] Ross  Female, 56 y.o., 06-12-1965  MRN:  [id number]  Phone:  [phone]  Thank you, Lovey Newcomer RN

## 2021-07-10 ENCOUNTER — Other Ambulatory Visit: Payer: Self-pay

## 2021-07-10 ENCOUNTER — Encounter: Payer: Self-pay | Admitting: Cardiology

## 2021-07-11 ENCOUNTER — Other Ambulatory Visit: Payer: 59 | Admitting: *Deleted

## 2021-07-11 ENCOUNTER — Other Ambulatory Visit: Payer: Self-pay

## 2021-07-11 DIAGNOSIS — I4891 Unspecified atrial fibrillation: Secondary | ICD-10-CM

## 2021-07-12 LAB — CBC WITH DIFFERENTIAL/PLATELET
Basophils Absolute: 0.1 10*3/uL (ref 0.0–0.2)
Basos: 1 %
EOS (ABSOLUTE): 0.3 10*3/uL (ref 0.0–0.4)
Eos: 2 %
Hematocrit: 31.7 % — ABNORMAL LOW (ref 34.0–46.6)
Hemoglobin: 11.1 g/dL (ref 11.1–15.9)
Immature Grans (Abs): 0.2 10*3/uL — ABNORMAL HIGH (ref 0.0–0.1)
Immature Granulocytes: 2 %
Lymphocytes Absolute: 2.1 10*3/uL (ref 0.7–3.1)
Lymphs: 20 %
MCH: 30.6 pg (ref 26.6–33.0)
MCHC: 35 g/dL (ref 31.5–35.7)
MCV: 87 fL (ref 79–97)
Monocytes Absolute: 0.6 10*3/uL (ref 0.1–0.9)
Monocytes: 5 %
Neutrophils Absolute: 7.1 10*3/uL — ABNORMAL HIGH (ref 1.4–7.0)
Neutrophils: 70 %
Platelets: 573 10*3/uL — ABNORMAL HIGH (ref 150–450)
RBC: 3.63 x10E6/uL — ABNORMAL LOW (ref 3.77–5.28)
RDW: 13.2 % (ref 11.7–15.4)
WBC: 10.2 10*3/uL (ref 3.4–10.8)

## 2021-07-12 LAB — BASIC METABOLIC PANEL
BUN/Creatinine Ratio: 19 (ref 9–23)
BUN: 16 mg/dL (ref 6–24)
CO2: 23 mmol/L (ref 20–29)
Calcium: 9.4 mg/dL (ref 8.7–10.2)
Chloride: 102 mmol/L (ref 96–106)
Creatinine, Ser: 0.85 mg/dL (ref 0.57–1.00)
Glucose: 88 mg/dL (ref 70–99)
Potassium: 4.8 mmol/L (ref 3.5–5.2)
Sodium: 141 mmol/L (ref 134–144)
eGFR: 80 mL/min/{1.73_m2} (ref >59)

## 2021-07-16 DIAGNOSIS — G4733 Obstructive sleep apnea (adult) (pediatric): Secondary | ICD-10-CM | POA: Diagnosis not present

## 2021-07-17 ENCOUNTER — Encounter: Payer: Self-pay | Admitting: Adult Health

## 2021-07-21 ENCOUNTER — Encounter: Payer: Self-pay | Admitting: Cardiology

## 2021-07-22 DIAGNOSIS — I480 Paroxysmal atrial fibrillation: Secondary | ICD-10-CM | POA: Diagnosis not present

## 2021-07-22 DIAGNOSIS — R051 Acute cough: Secondary | ICD-10-CM | POA: Diagnosis not present

## 2021-07-22 DIAGNOSIS — J40 Bronchitis, not specified as acute or chronic: Secondary | ICD-10-CM | POA: Diagnosis not present

## 2021-07-23 ENCOUNTER — Telehealth (HOSPITAL_COMMUNITY): Payer: Self-pay | Admitting: Emergency Medicine

## 2021-07-23 ENCOUNTER — Other Ambulatory Visit (HOSPITAL_COMMUNITY): Payer: Self-pay

## 2021-07-23 NOTE — Telephone Encounter (Signed)
Reaching out to patient to offer assistance regarding upcoming cardiac imaging study; pt verbalizes understanding of appt date/time, parking situation and where to check in, pre-test NPO status and medications ordered, and verified current allergies; name and call back number provided for further questions should they arise ?[name] Bond RN Navigator Cardiac Imaging ?[facility] ?[phone] office ?[phone] cell ? ?100mg  metoprolol tartrate ?Denies iv issues ?Arrival 1100 ?

## 2021-07-23 NOTE — Telephone Encounter (Signed)
Call complete

## 2021-07-24 ENCOUNTER — Other Ambulatory Visit: Payer: Self-pay

## 2021-07-24 ENCOUNTER — Ambulatory Visit
Admission: RE | Admit: 2021-07-24 | Discharge: 2021-07-24 | Disposition: A | Discharge status: Home / Self Care | Payer: 59 | Source: Ambulatory Visit | Attending: Physician Assistant | Admitting: Physician Assistant

## 2021-07-24 DIAGNOSIS — I4891 Unspecified atrial fibrillation: Secondary | ICD-10-CM | POA: Insufficient documentation

## 2021-07-24 DIAGNOSIS — R072 Precordial pain: Secondary | ICD-10-CM | POA: Insufficient documentation

## 2021-07-24 MED ORDER — IOHEXOL 350 MG/ML SOLN
100.0000 mL | Freq: Once | INTRAVENOUS | Status: AC | PRN
  Administered 2021-07-24: 100 mL via INTRAVENOUS

## 2021-07-24 MED ORDER — NITROGLYCERIN 0.4 MG SL SUBL
0.4000 mg | SUBLINGUAL_TABLET | Freq: Once | SUBLINGUAL | Status: AC
  Administered 2021-07-24: 0.4 mg via SUBLINGUAL

## 2021-07-24 MED ORDER — DILTIAZEM HCL 25 MG/5ML IV SOLN
10.0000 mg | Freq: Once | INTRAVENOUS | Status: AC
  Administered 2021-07-24: 10 mg via INTRAVENOUS

## 2021-07-24 MED ORDER — METOPROLOL TARTRATE 5 MG/5ML IV SOLN
10.0000 mg | Freq: Once | INTRAVENOUS | Status: AC
  Administered 2021-07-24: 10 mg via INTRAVENOUS

## 2021-07-24 MED ORDER — NITROGLYCERIN 0.4 MG SL SUBL
0.8000 mg | SUBLINGUAL_TABLET | Freq: Once | SUBLINGUAL | Status: AC
  Administered 2021-07-24: 0.8 mg via SUBLINGUAL

## 2021-07-24 NOTE — Progress Notes (Signed)
Patient tolerated CT well. Drank water after. Vital signs stable encourage to drink water throughout day.Reasons explained and verbalized understanding. Ambulated steady gait.  

## 2021-07-28 ENCOUNTER — Other Ambulatory Visit: Payer: 59

## 2021-07-29 ENCOUNTER — Encounter: Payer: Self-pay | Admitting: Gastroenterology

## 2021-07-29 ENCOUNTER — Telehealth: Payer: Self-pay | Admitting: *Deleted

## 2021-07-29 NOTE — Telephone Encounter (Signed)
Left voicemail message to call back for review of results.  

## 2021-07-29 NOTE — Telephone Encounter (Signed)
Reviewed results with patient and she verbalized understanding with no further questions at this time.  

## 2021-07-29 NOTE — Telephone Encounter (Signed)
-----   Message from Theora Gianotti, NP sent at 07/25/2021  9:48 AM EST ----- ?Normal coronary arteries with a calcium score of 0.  No significant non-cardiac findings either.  Great news, very reassuring. ?

## 2021-07-30 DIAGNOSIS — R1310 Dysphagia, unspecified: Secondary | ICD-10-CM | POA: Diagnosis not present

## 2021-07-30 NOTE — Anesthesia Preprocedure Evaluation (Addendum)
Anesthesia Evaluation  ?Patient identified by MRN, date of birth, ID band ?Patient awake ? ? ? ?Reviewed: ?Allergy & Precautions, NPO status , Patient's Chart, lab work & pertinent test results ? ?History of Anesthesia Complications ?(+) PONV and history of anesthetic complications ? ?Airway ?Mallampati: II ? ?TM Distance: >3 FB ?Neck ROM: Full ? ? ? Dental ? ?(+) Dental Advisory Given ?  ?Pulmonary ?sleep apnea and Continuous Positive Airway Pressure Ventilation ,  ?  ?breath sounds clear to auscultation ? ? ? ? ? ? Cardiovascular ?hypertension, Pt. on medications and Pt. on home beta blockers ?+ dysrhythmias Atrial Fibrillation  ?Rhythm:Regular Rate:Normal ? ?03/2021 ECHO: EF 60 to 65%. The LV has normal function, no regional wall motion abnormalities. There is mild asymmetric left ventricular hypertrophy of the basal-septal segment. No significant valvular abnormalities ?  ?Neuro/Psych ?Depression   ? GI/Hepatic ?Neg liver ROS, GERD  Medicated,  ?Endo/Other  ? ? Renal/GU ?negative Renal ROS  ? ?  ?Musculoskeletal ? ? Abdominal ?  ?Peds ? Hematology ?eliquis   ?Anesthesia Other Findings ? ? Reproductive/Obstetrics ? ?  ? ? ? ? ? ? ? ? ? ? ? ? ? ?  ?  ? ? ? ? ? ? ? ?Anesthesia Physical ?Anesthesia Plan ? ?ASA: 3 ? ?Anesthesia Plan: General  ? ?Post-op Pain Management: Tylenol PO (pre-op)* and Minimal or no pain anticipated  ? ?Induction: Intravenous ? ?PONV Risk Score and Plan: 4 or greater and Ondansetron, Dexamethasone and Scopolamine patch - Pre-op ? ?Airway Management Planned: Oral ETT ? ?Additional Equipment: None ? ?Intra-op Plan:  ? ?Post-operative Plan: Extubation in OR ? ?Informed Consent: I have reviewed the patients History and Physical, chart, labs and discussed the procedure including the risks, benefits and alternatives for the proposed anesthesia with the patient or authorized representative who has indicated his/her understanding and acceptance.  ? ? ? ?Dental  advisory given ? ?Plan Discussed with: CRNA ? ?Anesthesia Plan Comments:   ? ? ? ? ? ?Anesthesia Quick Evaluation ? ?

## 2021-07-30 NOTE — Pre-Procedure Instructions (Signed)
Instructed patient on the following items: Arrival time 0530 Nothing to eat or drink after midnight No meds AM of procedure Responsible person to drive you home and stay with you for 24 hrs  Have you missed any doses of anti-coagulant Eliquis- hasn't missed any doses    

## 2021-07-31 ENCOUNTER — Other Ambulatory Visit (HOSPITAL_COMMUNITY): Payer: Self-pay

## 2021-07-31 ENCOUNTER — Ambulatory Visit (HOSPITAL_BASED_OUTPATIENT_CLINIC_OR_DEPARTMENT_OTHER): Payer: 59 | Admitting: Anesthesiology

## 2021-07-31 ENCOUNTER — Ambulatory Visit (HOSPITAL_COMMUNITY): Payer: 59 | Admitting: Anesthesiology

## 2021-07-31 ENCOUNTER — Encounter (HOSPITAL_COMMUNITY)
Admission: RE | Disposition: A | Discharge status: Home / Self Care | Payer: 59 | Source: Home / Self Care | Attending: Cardiology

## 2021-07-31 ENCOUNTER — Encounter (HOSPITAL_COMMUNITY): Payer: Self-pay | Admitting: Cardiology

## 2021-07-31 ENCOUNTER — Other Ambulatory Visit: Payer: Self-pay

## 2021-07-31 ENCOUNTER — Ambulatory Visit (HOSPITAL_COMMUNITY)
Admission: RE | Admit: 2021-07-31 | Discharge: 2021-07-31 | Disposition: A | Discharge status: Home / Self Care | Payer: 59 | Attending: Cardiology | Admitting: Cardiology

## 2021-07-31 DIAGNOSIS — Z9989 Dependence on other enabling machines and devices: Secondary | ICD-10-CM | POA: Diagnosis not present

## 2021-07-31 DIAGNOSIS — I4891 Unspecified atrial fibrillation: Secondary | ICD-10-CM

## 2021-07-31 DIAGNOSIS — Z7901 Long term (current) use of anticoagulants: Secondary | ICD-10-CM | POA: Diagnosis not present

## 2021-07-31 DIAGNOSIS — I483 Typical atrial flutter: Secondary | ICD-10-CM | POA: Insufficient documentation

## 2021-07-31 DIAGNOSIS — Z7985 Long-term (current) use of injectable non-insulin antidiabetic drugs: Secondary | ICD-10-CM | POA: Insufficient documentation

## 2021-07-31 DIAGNOSIS — Z79899 Other long term (current) drug therapy: Secondary | ICD-10-CM | POA: Insufficient documentation

## 2021-07-31 DIAGNOSIS — I10 Essential (primary) hypertension: Secondary | ICD-10-CM | POA: Insufficient documentation

## 2021-07-31 DIAGNOSIS — K219 Gastro-esophageal reflux disease without esophagitis: Secondary | ICD-10-CM | POA: Insufficient documentation

## 2021-07-31 DIAGNOSIS — G4733 Obstructive sleep apnea (adult) (pediatric): Secondary | ICD-10-CM | POA: Diagnosis not present

## 2021-07-31 DIAGNOSIS — Z6834 Body mass index (BMI) 34.0-34.9, adult: Secondary | ICD-10-CM | POA: Insufficient documentation

## 2021-07-31 DIAGNOSIS — I4819 Other persistent atrial fibrillation: Secondary | ICD-10-CM | POA: Insufficient documentation

## 2021-07-31 DIAGNOSIS — E785 Hyperlipidemia, unspecified: Secondary | ICD-10-CM | POA: Insufficient documentation

## 2021-07-31 HISTORY — PX: ATRIAL FIBRILLATION ABLATION: EP1191

## 2021-07-31 LAB — POCT ACTIVATED CLOTTING TIME
Activated Clotting Time: 263 seconds
Activated Clotting Time: 269 seconds
Activated Clotting Time: 299 seconds
Activated Clotting Time: 329 seconds

## 2021-07-31 SURGERY — ATRIAL FIBRILLATION ABLATION
Anesthesia: General

## 2021-07-31 MED ORDER — HEPARIN (PORCINE) IN NACL 1000-0.9 UT/500ML-% IV SOLN
INTRAVENOUS | Status: AC
  Filled 2021-07-31: qty 1500

## 2021-07-31 MED ORDER — COLCHICINE 0.6 MG PO TABS
0.6000 mg | ORAL_TABLET | Freq: Two times a day (BID) | ORAL | Status: DC
  Administered 2021-07-31: 14:00:00 0.6 mg via ORAL
  Filled 2021-07-31: qty 1

## 2021-07-31 MED ORDER — HEPARIN (PORCINE) IN NACL 1000-0.9 UT/500ML-% IV SOLN
INTRAVENOUS | Status: DC | PRN
  Administered 2021-07-31 (×4): 500 mL

## 2021-07-31 MED ORDER — SUGAMMADEX SODIUM 200 MG/2ML IV SOLN
INTRAVENOUS | Status: DC | PRN
  Administered 2021-07-31: 400 mg via INTRAVENOUS

## 2021-07-31 MED ORDER — AMISULPRIDE (ANTIEMETIC) 5 MG/2ML IV SOLN
10.0000 mg | Freq: Once | INTRAVENOUS | Status: AC
  Administered 2021-07-31: 12:00:00 10 mg via INTRAVENOUS

## 2021-07-31 MED ORDER — ONDANSETRON HCL 4 MG/2ML IJ SOLN: 4.0000 mg | Freq: Four times a day (QID) | INTRAMUSCULAR | Status: DC | PRN

## 2021-07-31 MED ORDER — ACETAMINOPHEN 325 MG PO TABS
ORAL_TABLET | ORAL | Status: AC
  Filled 2021-07-31: qty 2

## 2021-07-31 MED ORDER — ROCURONIUM BROMIDE 10 MG/ML (PF) SYRINGE
PREFILLED_SYRINGE | INTRAVENOUS | Status: DC | PRN
Start: 2021-07-31 — End: 2021-07-31
  Administered 2021-07-31: 20 mg via INTRAVENOUS
  Administered 2021-07-31: 30 mg via INTRAVENOUS
  Administered 2021-07-31: 10 mg via INTRAVENOUS
  Administered 2021-07-31: 20 mg via INTRAVENOUS
  Administered 2021-07-31: 50 mg via INTRAVENOUS

## 2021-07-31 MED ORDER — PROTAMINE SULFATE 10 MG/ML IV SOLN
INTRAVENOUS | Status: DC | PRN
  Administered 2021-07-31: 10 mg via INTRAVENOUS

## 2021-07-31 MED ORDER — SODIUM CHLORIDE 0.9 % IV SOLN: INTRAVENOUS | Status: DC

## 2021-07-31 MED ORDER — HEPARIN SODIUM (PORCINE) 1000 UNIT/ML IJ SOLN
INTRAMUSCULAR | Status: AC
  Filled 2021-07-31: qty 10

## 2021-07-31 MED ORDER — ACETAMINOPHEN 325 MG PO TABS
650.0000 mg | ORAL_TABLET | ORAL | Status: DC | PRN
  Administered 2021-07-31: 15:00:00 650 mg via ORAL

## 2021-07-31 MED ORDER — ISOPROTERENOL HCL 0.2 MG/ML IJ SOLN
INTRAVENOUS | Status: DC | PRN
  Administered 2021-07-31: 11:00:00 2 ug/min via INTRAVENOUS

## 2021-07-31 MED ORDER — AMISULPRIDE (ANTIEMETIC) 5 MG/2ML IV SOLN
INTRAVENOUS | Status: AC
  Filled 2021-07-31: qty 4

## 2021-07-31 MED ORDER — SODIUM CHLORIDE 0.9% FLUSH: 3.0000 mL | Freq: Two times a day (BID) | INTRAVENOUS | Status: DC

## 2021-07-31 MED ORDER — APIXABAN 5 MG PO TABS
5.0000 mg | ORAL_TABLET | Freq: Two times a day (BID) | ORAL | Status: DC
  Administered 2021-07-31: 14:00:00 5 mg via ORAL
  Filled 2021-07-31: qty 1

## 2021-07-31 MED ORDER — PHENYLEPHRINE 40 MCG/ML (10ML) SYRINGE FOR IV PUSH (FOR BLOOD PRESSURE SUPPORT)
PREFILLED_SYRINGE | INTRAVENOUS | Status: DC | PRN
  Administered 2021-07-31 (×2): 80 ug via INTRAVENOUS
  Administered 2021-07-31: 120 ug via INTRAVENOUS
  Administered 2021-07-31: 40 ug via INTRAVENOUS

## 2021-07-31 MED ORDER — MIDAZOLAM HCL 5 MG/5ML IJ SOLN
INTRAMUSCULAR | Status: DC | PRN
  Administered 2021-07-31: 2 mg via INTRAVENOUS

## 2021-07-31 MED ORDER — SODIUM CHLORIDE 0.9% FLUSH: 3.0000 mL | INTRAVENOUS | Status: DC | PRN

## 2021-07-31 MED ORDER — PANTOPRAZOLE SODIUM 40 MG PO TBEC: 40.0000 mg | DELAYED_RELEASE_TABLET | Freq: Every day | ORAL | 0 refills | Status: DC

## 2021-07-31 MED ORDER — ONDANSETRON HCL 4 MG/2ML IJ SOLN
INTRAMUSCULAR | Status: DC | PRN
Start: 2021-07-31 — End: 2021-07-31
  Administered 2021-07-31: 4 mg via INTRAVENOUS

## 2021-07-31 MED ORDER — ACETAMINOPHEN 500 MG PO TABS
1000.0000 mg | ORAL_TABLET | Freq: Once | ORAL | Status: DC
  Filled 2021-07-31: qty 2

## 2021-07-31 MED ORDER — LIDOCAINE 2% (20 MG/ML) 5 ML SYRINGE
INTRAMUSCULAR | Status: DC | PRN
  Administered 2021-07-31: 60 mg via INTRAVENOUS

## 2021-07-31 MED ORDER — DEXAMETHASONE SODIUM PHOSPHATE 10 MG/ML IJ SOLN
INTRAMUSCULAR | Status: DC | PRN
  Administered 2021-07-31: 10 mg via INTRAVENOUS

## 2021-07-31 MED ORDER — HEPARIN (PORCINE) IN NACL 1000-0.9 UT/500ML-% IV SOLN
INTRAVENOUS | Status: AC
  Filled 2021-07-31: qty 500

## 2021-07-31 MED ORDER — SCOPOLAMINE 1 MG/3DAYS TD PT72
1.0000 | MEDICATED_PATCH | TRANSDERMAL | Status: AC
  Administered 2021-07-31: 07:00:00 1 via TRANSDERMAL

## 2021-07-31 MED ORDER — HEPARIN SODIUM (PORCINE) 1000 UNIT/ML IJ SOLN
INTRAMUSCULAR | Status: DC | PRN
  Administered 2021-07-31: 1000 [IU] via INTRAVENOUS

## 2021-07-31 MED ORDER — PHENYLEPHRINE HCL-NACL 20-0.9 MG/250ML-% IV SOLN
INTRAVENOUS | Status: DC | PRN
  Administered 2021-07-31: 25 ug/min via INTRAVENOUS

## 2021-07-31 MED ORDER — PANTOPRAZOLE SODIUM 40 MG PO TBEC
40.0000 mg | DELAYED_RELEASE_TABLET | Freq: Every day | ORAL | Status: DC
  Administered 2021-07-31: 14:00:00 40 mg via ORAL
  Filled 2021-07-31: qty 1

## 2021-07-31 MED ORDER — PROPOFOL 10 MG/ML IV BOLUS
INTRAVENOUS | Status: DC | PRN
  Administered 2021-07-31: 150 mg via INTRAVENOUS
  Administered 2021-07-31: 50 mg via INTRAVENOUS

## 2021-07-31 MED ORDER — FENTANYL CITRATE (PF) 250 MCG/5ML IJ SOLN
INTRAMUSCULAR | Status: DC | PRN
  Administered 2021-07-31: 100 ug via INTRAVENOUS

## 2021-07-31 MED ORDER — SODIUM CHLORIDE 0.9 % IV SOLN: 250.0000 mL | INTRAVENOUS | Status: DC | PRN

## 2021-07-31 MED ORDER — ISOPROTERENOL HCL 0.2 MG/ML IJ SOLN
INTRAMUSCULAR | Status: AC
  Filled 2021-07-31: qty 5

## 2021-07-31 MED ORDER — COLCHICINE 0.6 MG PO TABS: 0.6000 mg | ORAL_TABLET | Freq: Two times a day (BID) | ORAL | 0 refills | Status: DC

## 2021-07-31 MED ORDER — HEPARIN SODIUM (PORCINE) 1000 UNIT/ML IJ SOLN
INTRAMUSCULAR | Status: DC | PRN
Start: 2021-07-31 — End: 2021-07-31
  Administered 2021-07-31: 4000 [IU] via INTRAVENOUS
  Administered 2021-07-31: 6000 [IU] via INTRAVENOUS
  Administered 2021-07-31 (×2): 5000 [IU] via INTRAVENOUS
  Administered 2021-07-31: 15000 [IU] via INTRAVENOUS

## 2021-07-31 SURGICAL SUPPLY — 17 items
BLANKET WARM UNDERBOD FULL ACC (MISCELLANEOUS) ×2 IMPLANT
CATH 8FR REPROCESSED SOUNDSTAR (CATHETERS) ×2 IMPLANT
CATH 8FR SOUNDSTAR REPROCESSED (CATHETERS) IMPLANT
CATH OCTARAY 2.0 F 3-3-3-3-3 (CATHETERS) ×1 IMPLANT
CATH S CIRCA THERM PROBE 10F (CATHETERS) ×1 IMPLANT
CATH SMTCH THERMOCOOL SF DF (CATHETERS) ×1 IMPLANT
CATH WEBSTER BI DIR CS D-F CRV (CATHETERS) ×1 IMPLANT
CLOSURE PERCLOSE PROSTYLE (VASCULAR PRODUCTS) ×3 IMPLANT
COVER SWIFTLINK CONNECTOR (BAG) ×2 IMPLANT
PACK EP LATEX FREE (CUSTOM PROCEDURE TRAY) ×2
PACK EP LF (CUSTOM PROCEDURE TRAY) ×1 IMPLANT
PAD DEFIB RADIO PHYSIO CONN (PAD) ×2 IMPLANT
PATCH CARTO3 (PAD) ×1 IMPLANT
SHEATH BAYLIS TRANSSEPTAL 98CM (NEEDLE) ×1 IMPLANT
SHEATH CARTO VIZIGO SM CVD (SHEATH) ×1 IMPLANT
SHEATH PROBE COVER 6X72 (BAG) ×1 IMPLANT
TUBING SMART ABLATE COOLFLOW (TUBING) ×1 IMPLANT

## 2021-07-31 NOTE — Anesthesia Procedure Notes (Signed)
Procedure Name: Intubation ?Date/Time: 07/31/2021 7:49 AM ?Performed by: Jenne Campus, CRNA ?Pre-anesthesia Checklist: Patient identified, Emergency Drugs available, Suction available and Patient being monitored ?Patient Re-evaluated:Patient Re-evaluated prior to induction ?Oxygen Delivery Method: Circle System Utilized ?Preoxygenation: Pre-oxygenation with 100% oxygen ?Induction Type: IV induction ?Ventilation: Mask ventilation without difficulty, Two handed mask ventilation required and Oral airway inserted - appropriate to patient size ?Laryngoscope Size: Sabra Heck and 3 ?Grade View: Grade II ?Tube type: Oral ?Tube size: 7.0 mm ?Number of attempts: 1 ?Airway Equipment and Method: Stylet and Oral airway ?Placement Confirmation: ETT inserted through vocal cords under direct vision, positive ETCO2 and breath sounds checked- equal and bilateral ?Secured at: 22 cm ?Tube secured with: Tape ?Dental Injury: Teeth and Oropharynx as per pre-operative assessment  ? ? ? ? ?

## 2021-07-31 NOTE — Discharge Instructions (Signed)

## 2021-07-31 NOTE — Transfer of Care (Signed)
Immediate Anesthesia Transfer of Care Note ? ?Patient: [name] Ross ? ?Procedure(s) Performed: ATRIAL FIBRILLATION ABLATION ? ?Patient Location: Cath Lab ? ?Anesthesia Type:General ? ?Level of Consciousness: awake, oriented and patient cooperative ? ?Airway & Oxygen Therapy: Patient Spontanous Breathing and Patient connected to nasal cannula oxygen ? ?Post-op Assessment: Report given to RN and Post -op Vital signs reviewed and stable ? ?Post vital signs: Reviewed ? ?Last Vitals:  ?Vitals Value Taken Time  ?BP 120/72 07/31/21 1129  ?Temp    ?Pulse 98 07/31/21 1130  ?Resp 17 07/31/21 1130  ?SpO2 94 % 07/31/21 1130  ?Vitals shown include unvalidated device data. ? ?Last Pain:  ?Vitals:  ? 07/31/21 0520  ?TempSrc: Oral  ?   ? ?  ? ?Complications: No notable events documented. ?

## 2021-07-31 NOTE — H&P (Signed)
Electrophysiology Office Follow up Visit Note:     Date:  07/31/2021    ID:  [name] Ross, DOB 1965/12/19, MRN [id number]   PCP:  Leanna Battles, MD    [facility] Cardiologist:  Kate Sable, MD  [facility] Electrophysiologist:  Vickie Epley, MD      Interval History:     [name] Ross is a 57 y.o. female who presents for a follow up visit. They were last seen in clinic December 25, 2020.  That appointment was for her paroxysmal atrial fibrillation.  She was on flecainide at the time of our last appointment.  She was seen in the emergency department April 05, 2021 for atrial fibrillation with rapid ventricular rates.  She was cardioverted on April 08, 2021.   She is clearly symptomatic in atrial fibrillation and wishes to pursue rhythm control.  She continues to take Eliquis for stroke prophylaxis without bleeding issues.      Today she is doing well. No problems with AC recently.       Objective          Past Medical History:  Diagnosis Date   Anal skin tag     Atrial fibrillation ([place])     Depression     Diverticulosis     Diverticulosis, sigmoid     Dyslipidemia     GERD (gastroesophageal reflux disease)     Hemorrhoids      internal and external   History of diverticulitis      10/ 2016   History of galactorrhea     History of stress test      a. Pt reports h/o stress test and echo by cardiologist in [place] (no records in [place]).  Both reportedly nl.   Hypertension     IBS (irritable bowel syndrome)     Morbid obesity (HCC)     PONV (postoperative nausea and vomiting)      severe   Rash      elbows and behind knees   Restless leg syndrome     Sleep apnea      cpap sometimes   Wears glasses             Past Surgical History:  Procedure Laterality Date   BAND HEMORRHOIDECTOMY   2012    Dr Earlean Shawl   CARDIOVERSION N/A 04/08/2021    Procedure: CARDIOVERSION;  Surgeon: Nelva Bush, MD;  Location: [facility] ORS;  Service: Cardiovascular;   Laterality: N/A;   [place]   COLONOSCOPY   06/18/2011   CYSTO/  BILATERAL PYELOGRAM RETROGRADES/  TRANSOBTURATOR SLING   03/08/2006   EXCISION OF SKIN TAG N/A 02/13/2016    Procedure: ANAL SKIN TAG EXCISION;  Surgeon: [name], MD;  Location: [place];  Service: General;  Laterality: N/A;   LAPAROSCOPIC CHOLECYSTECTOMY   05/22/2001   SHOULDER SURGERY          Current Medications: Active Medications      Current Meds  Medication Sig   acetaminophen (TYLENOL) 500 MG tablet Take 1,000 mg by mouth every 6 (six) hours as needed for mild pain.   buPROPion (WELLBUTRIN XL) 300 MG 24 hr tablet Take 300 mg by mouth every morning.    butalbital-acetaminophen-caffeine (FIORICET) 50-325-40 MG tablet Take by mouth 2 (two) times daily as needed for headache.   carvedilol (COREG) 25 MG tablet Take 1.5 tablets (37.5 mg total) by mouth 2 (two) times daily.   dicyclomine (BENTYL) 10 MG  capsule Take one tablet by mouth once to twice daily as needed   ELIQUIS 5 MG TABS tablet TAKE 1 TABLET BY MOUTH TWICE DAILY   escitalopram (LEXAPRO) 20 MG tablet Take 20 mg by mouth every morning.    estradiol (ESTRACE) 2 MG tablet Take 2 mg by mouth daily.   fenofibrate micronized (LOFIBRA) 200 MG capsule Take 200 mg by mouth daily before breakfast.    flecainide (TAMBOCOR) 50 MG tablet TAKE 1 TABLET BY MOUTH TWICE DAILY   fluticasone (FLONASE) 50 MCG/ACT nasal spray Place 1 spray into both nostrils daily.   HYDROcodone bit-homatropine (HYCODAN) 5-1.5 MG/5ML syrup Take 5 mLs by mouth every 6 (six) hours as needed for cough.   ibuprofen (ADVIL) 200 MG tablet Take 600-800 mg by mouth every 6 (six) hours as needed for moderate pain.   linaclotide (LINZESS) 72 MCG capsule Take 72 mcg by mouth daily as needed.   loratadine (ALAVERT) 10 MG dissolvable tablet Take 1 tablet by mouth daily.   losartan (COZAAR) 100 MG tablet Take 100 mg by mouth every morning.    magnesium oxide (MAG-OX) 400  (240 Mg) MG tablet Take 1 tablet by mouth daily.   meclizine (ANTIVERT) 25 MG tablet Take 1 tablet (25 mg total) by mouth 3 (three) times daily as needed for dizziness.   mometasone (ELOCON) 0.1 % ointment Apply topically 2 (two) times daily as needed.   omega-3 acid ethyl esters (LOVAZA) 1 g capsule Take 1 capsule (1 g total) by mouth 2 (two) times daily.   omeprazole (PRILOSEC) 20 MG capsule Take 1 capsule (20 mg total) by mouth daily.   ondansetron (ZOFRAN-ODT) 4 MG disintegrating tablet Take 4 mg by mouth as directed.   OZEMPIC, 2 MG/DOSE, 8 MG/3ML SOPN Inject 2 mg into the skin once a week.   progesterone (PROMETRIUM) 200 MG capsule Take 200 mg by mouth at bedtime.   tacrolimus (PROTOPIC) 0.1 % ointment Apply 1 application topically 2 (two) times daily as needed. For psoriasis   triamcinolone (KENALOG) 0.1 % Apply 1 application topically 2 (two) times daily as needed.   triamterene-hydrochlorothiazide (MAXZIDE-25) 37.5-25 MG per tablet Take 0.5 tablets by mouth every morning.    valACYclovir (VALTREX) 1000 MG tablet Take 1,000 mg by mouth 2 (two) times daily as needed.    zolpidem (AMBIEN) 10 MG tablet Take 10 mg by mouth at bedtime as needed for sleep.        Allergies:   Venlafaxine    Social History         Socioeconomic History   Marital status: Married      Spouse name: [name]   Number of children: 2   Years of education: RN   Highest education level: Not on file  Occupational History   Occupation: Therapist, sports  Tobacco Use   Smoking status: Never   Smokeless tobacco: Never  Vaping Use   Vaping Use: Never used  Substance and Sexual Activity   Alcohol use: No      Alcohol/week: 0.0 standard drinks   Drug use: No   Sexual activity: Not on file  Other Topics Concern   Not on file  Social History Narrative    Lives in [place].  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  Occasionally drinks coffee but not a heavy caffeine drinker.      Social Determinants of Health     Financial Resource Strain: Not on file  Food Insecurity: Not on file  Transportation Needs: Not  on file  Physical Activity: Not on file  Stress: Not on file  Social Connections: Not on file      Family History: The patient's family history includes Atrial fibrillation in her mother; Breast cancer in an other family member; Colon cancer in her mother; Diabetes in her father, mother, sister, and sister; Hypertension in her father and mother; Skin cancer in her father; Suicidality in her father.   ROS:   Please see the history of present illness.    All other systems reviewed and are negative.   EKGs/Labs/Other Studies Reviewed:     The following studies were reviewed today:   April 07, 2021 echo Left ventricular function normal, 60% Right ventricular function normal Trivial MR     EKG:  The ekg ordered today demonstrates sinus rhythm.  PR interval 170 ms.  QRS duration 80 ms.   Recent Labs: 04/05/2021: ALT 14; B Natriuretic Peptide 92.2 04/06/2021: Hemoglobin 12.4; Magnesium 2.0; Platelets 582; TSH 3.374 04/07/2021: BUN 22; Creatinine, Ser 0.86; Potassium 4.2; Sodium 137  Recent Lipid Panel Labs (Brief)          Component Value Date/Time    CHOL 204 (H) 12/20/2019 0502    TRIG 510 (H) 12/20/2019 0502    HDL 39 (L) 12/20/2019 0502    CHOLHDL 5.2 12/20/2019 0502    VLDL UNABLE TO CALCULATE IF TRIGLYCERIDE OVER 400 mg/dL 12/20/2019 0502    LDLCALC UNABLE TO CALCULATE IF TRIGLYCERIDE OVER 400 mg/dL 12/20/2019 0502    LDLDIRECT 66.8 12/20/2019 0502        Physical Exam:     VS:  BP 100/74 (BP Location: Left Arm, Patient Position: Sitting, Cuff Size: Normal)    Pulse 84    Ht '5\' 6"'$  (1.676 m)    Wt 215 lb (97.5 kg)    SpO2 97%    BMI 34.70 kg/m         Wt Readings from Last 3 Encounters:  04/23/21 215 lb (97.5 kg)  04/08/21 224 lb 13.9 oz (102 kg)  03/21/21 225 lb 3.2 oz (102.2 kg)      GEN:  Well nourished, well developed in no acute distress.  Obese HEENT:  Normal NECK: No JVD; No carotid bruits LYMPHATICS: No lymphadenopathy CARDIAC: RRR, no murmurs, rubs, gallops RESPIRATORY:  Clear to auscultation without rales, wheezing or rhonchi  ABDOMEN: Soft, non-tender, non-distended MUSCULOSKELETAL:  No edema; No deformity  SKIN: Warm and dry NEUROLOGIC:  Alert and oriented x 3 PSYCHIATRIC:  Normal affect            Assessment     ASSESSMENT:     1. Paroxysmal atrial fibrillation (HCC)   2. Essential hypertension   3. OSA (obstructive sleep apnea)     PLAN:     In order of problems listed above:   #Symptomatic persistent atrial fibrillation #Atrial flutter, typical   Symptomatic.  Patient would like to pursue a rhythm control strategy which I think is the best option for her as well.  She is currently on flecainide but this is not maintaining rhythm for her.  We went back over the various options including catheter ablation and she wishes to proceed with catheter ablation.  I discussed the procedure in detail include the risk, recovery and likelihood of success.  We discussed the possibility of needing antiarrhythmic drug therapy in the future or even a repeat ablation.  She would like to proceed with scheduling.  Risk, benefits, and alternatives to EP study and radiofrequency ablation  for afib were also discussed in detail today. These risks include but are not limited to stroke, bleeding, vascular damage, tamponade, perforation, damage to the esophagus, lungs, and other structures, pulmonary vein stenosis, worsening renal function, and death. The patient understands these risk and wishes to proceed.  We will therefore proceed with catheter ablation at the next available time.  Carto, ICE, anesthesia are requested for the procedure.  Will also obtain CT PV protocol prior to the procedure to exclude LAA thrombus and further evaluate atrial anatomy.  Ablation strategy will be PVI plus posterior wall plus CTI.   #Hypertension Her blood  pressures have been on the lower side.  We will decrease her losartan to 50 mg by mouth once daily.  She will continue her other medicines.  She will continue to check her blood pressures 1-2 times per week.  #Obstructive sleep apnea on CPAP Encouraged continued use of CPAP.      Signed, Lars Mage, MD, [facility], [place]     -----------------------  I have seen, examined the patient, and reviewed the above assessment and plan.    Plan for PVI/PosteriorWall/CTI today. Procedure reviewed.   Vickie Epley, MD 07/31/2021 7:19 AM

## 2021-07-31 NOTE — Progress Notes (Signed)
Nausea slowly lessoning ?

## 2021-08-01 ENCOUNTER — Encounter (HOSPITAL_COMMUNITY): Payer: Self-pay | Admitting: Cardiology

## 2021-08-01 ENCOUNTER — Other Ambulatory Visit (HOSPITAL_COMMUNITY): Payer: Self-pay

## 2021-08-01 ENCOUNTER — Ambulatory Visit (HOSPITAL_COMMUNITY): Payer: 59 | Admitting: Physician Assistant

## 2021-08-01 NOTE — Anesthesia Postprocedure Evaluation (Signed)
Anesthesia Post Note ? ?Patient: [name] Ross ? ?Procedure(s) Performed: ATRIAL FIBRILLATION ABLATION ? ?  ? ?Patient location during evaluation: PACU ?Anesthesia Type: General ?Level of consciousness: awake and alert ?Pain management: pain level controlled ?Vital Signs Assessment: post-procedure vital signs reviewed and stable ?Respiratory status: spontaneous breathing, nonlabored ventilation, respiratory function stable and patient connected to nasal cannula oxygen ?Cardiovascular status: blood pressure returned to baseline and stable ?Postop Assessment: no apparent nausea or vomiting ?Anesthetic complications: no ? ? ?No notable events documented. ? ?Last Vitals:  ?Vitals:  ? 07/31/21 1525 07/31/21 1530  ?BP:    ?Pulse: 100 99  ?Resp: 18 17  ?Temp:    ?SpO2: 96% 96%  ?  ?Last Pain:  ?Vitals:  ? 07/31/21 1524  ?TempSrc:   ?PainSc: 7   ? ? ?  ?  ?  ?  ?  ?  ? ?[place] ? ? ? ? ?

## 2021-08-04 ENCOUNTER — Encounter: Payer: Self-pay | Admitting: Cardiology

## 2021-08-05 MED ORDER — LOSARTAN POTASSIUM 50 MG PO TABS: 50.0000 mg | ORAL_TABLET | Freq: Every day | ORAL | Status: DC

## 2021-08-11 ENCOUNTER — Telehealth: Payer: Self-pay | Admitting: Cardiology

## 2021-08-11 DIAGNOSIS — I480 Paroxysmal atrial fibrillation: Secondary | ICD-10-CM

## 2021-08-11 DIAGNOSIS — I10 Essential (primary) hypertension: Secondary | ICD-10-CM

## 2021-08-11 MED ORDER — CARVEDILOL 25 MG PO TABS: 50.0000 mg | ORAL_TABLET | Freq: Two times a day (BID) | ORAL | 3 refills | Status: DC

## 2021-08-11 MED ORDER — LOSARTAN POTASSIUM 50 MG PO TABS: 50.0000 mg | ORAL_TABLET | Freq: Every day | ORAL | 2 refills | Status: DC

## 2021-08-11 NOTE — Telephone Encounter (Signed)
Losartan 50 mg daily and carvedilol 50 mg BID sent into patient's pharmacy.  ? ?Called patient to let her know, no answer, detailed message left per DPR.  ?

## 2021-08-11 NOTE — Telephone Encounter (Signed)
Pt c/o medication issue: ? ?1. Name of Medication: carvedilol 50 mg po BID and losartan 50 mg po q d  ? ?2. How are you currently taking this medication (dosage and times per day)? See above patient states increased by Thurmond Butts and on call on previous encounters  ? ?3. Are you having a reaction (difficulty breathing--STAT)? no ? ?4. What is your medication issue? Patient states pharmacy never received med change rx ? ?

## 2021-08-13 DIAGNOSIS — G4733 Obstructive sleep apnea (adult) (pediatric): Secondary | ICD-10-CM | POA: Diagnosis not present

## 2021-08-20 ENCOUNTER — Other Ambulatory Visit (HOSPITAL_COMMUNITY): Payer: Self-pay

## 2021-08-21 ENCOUNTER — Ambulatory Visit (HOSPITAL_COMMUNITY): Payer: 59 | Admitting: Physician Assistant

## 2021-08-26 ENCOUNTER — Ambulatory Visit (HOSPITAL_COMMUNITY)
Admission: RE | Admit: 2021-08-26 | Discharge: 2021-08-26 | Disposition: A | Discharge status: Home / Self Care | Payer: 59 | Source: Ambulatory Visit | Attending: Physician Assistant | Admitting: Physician Assistant

## 2021-08-26 ENCOUNTER — Encounter (HOSPITAL_COMMUNITY): Payer: Self-pay | Admitting: Physician Assistant

## 2021-08-26 VITALS — BP 118/84 | HR 85 | Ht 66.5 in | Wt 217.8 lb

## 2021-08-26 DIAGNOSIS — I480 Paroxysmal atrial fibrillation: Secondary | ICD-10-CM | POA: Diagnosis not present

## 2021-08-26 DIAGNOSIS — I10 Essential (primary) hypertension: Secondary | ICD-10-CM | POA: Diagnosis not present

## 2021-08-26 DIAGNOSIS — G4733 Obstructive sleep apnea (adult) (pediatric): Secondary | ICD-10-CM | POA: Insufficient documentation

## 2021-08-26 DIAGNOSIS — Z6834 Body mass index (BMI) 34.0-34.9, adult: Secondary | ICD-10-CM | POA: Insufficient documentation

## 2021-08-26 DIAGNOSIS — E785 Hyperlipidemia, unspecified: Secondary | ICD-10-CM | POA: Diagnosis not present

## 2021-08-26 DIAGNOSIS — E669 Obesity, unspecified: Secondary | ICD-10-CM | POA: Diagnosis not present

## 2021-08-26 DIAGNOSIS — Z7901 Long term (current) use of anticoagulants: Secondary | ICD-10-CM | POA: Insufficient documentation

## 2021-08-26 DIAGNOSIS — K219 Gastro-esophageal reflux disease without esophagitis: Secondary | ICD-10-CM | POA: Diagnosis not present

## 2021-08-26 DIAGNOSIS — Z79899 Other long term (current) drug therapy: Secondary | ICD-10-CM | POA: Insufficient documentation

## 2021-08-26 NOTE — Progress Notes (Signed)
? ? ?Primary Care Physician: Donnajean Lopes, MD ?Primary Cardiologist: Dr [name] ?Primary Electrophysiologist: Dr Quentin Ore  ?Referring Physician: [facility]  ? ? ?[name] Ross is a 56 y.o. female with a history of atrial fibrillation, atrial flutter, HTN, HLD, OSA, depression, GERD who presents for consultation in the [facility]. The patient was initially diagnosed with atrial fibrillation 11/2019 after presenting to the ED with symptoms of tachypalpitations. Patient is on Eliquis for a CHADS2VASC score of 2.  ? ?On follow up today, patient was hospitalized 03/2021 with afib in the setting of influenza infection. She underwent DCCV at that time. She saw Dr Quentin Ore in follow up and underwent afib and flutter ablation on 07/31/21. Patient reports that she has done well since the procedure. No symptoms of tachypalpitations. She also denies CP, swallowing pain, or groin issues.  ? ?Today, she denies symptoms of palpitations, chest pain, shortness of breath, orthopnea, PND, lower extremity edema, dizziness, presyncope, syncope, bleeding, or neurologic sequela. The patient is tolerating medications without difficulties and is otherwise without complaint today.  ? ? ?Atrial Fibrillation Risk Factors: ? ?she does have symptoms or diagnosis of sleep apnea. ?she is compliant with CPAP therapy. ?she does not have a history of rheumatic fever. ?she does not have a history of alcohol use. ?The patient does have a history of early familial atrial fibrillation or other arrhythmias. Mother has afib. ? ?she has a BMI of Body mass index is 34.63 kg/m?Marland KitchenMarland Kitchen ?Filed Weights  ? 08/26/21 1352  ?Weight: 98.8 kg  ? ? ? ?Family History  ?Problem Relation Age of Onset  ? Diabetes Mother   ? Hypertension Mother   ? Atrial fibrillation Mother   ? Colon cancer Mother   ? Diabetes Father   ?     committed suicide  ? Skin cancer Father   ? Suicidality Father   ? Hypertension Father   ? Diabetes Sister   ?  Diabetes Sister   ? Breast cancer Other   ?     materanl great aunt  ? Sleep apnea Neg Hx   ? ? ? ?Atrial Fibrillation Management history: ? ?Previous antiarrhythmic drugs: flecainide  ?Previous cardioversions: 04/08/21 ?Previous ablations: 07/31/21 ?CHADS2VASC score: 2 ?Anticoagulation history: Eliquis ? ? ?Past Medical History:  ?Diagnosis Date  ? Anal skin tag   ? Atrial fibrillation ([place])   ? Depression   ? Diverticulosis   ? Diverticulosis, sigmoid   ? Dyslipidemia   ? GERD (gastroesophageal reflux disease)   ? Hemorrhoids   ? internal and external  ? History of diverticulitis   ? 10/ 2016  ? History of galactorrhea   ? History of stress test   ? a. Pt reports h/o stress test and echo by cardiologist in [place] (no records in [place]).  Both reportedly nl.  ? Hypertension   ? IBS (irritable bowel syndrome)   ? Morbid obesity ([place])   ? PONV (postoperative nausea and vomiting)   ? severe  ? Rash   ? elbows and behind knees  ? Restless leg syndrome   ? Sleep apnea   ? cpap sometimes  ? Wears glasses   ? ?Past Surgical History:  ?Procedure Laterality Date  ? ATRIAL FIBRILLATION ABLATION N/A 07/31/2021  ? Procedure: ATRIAL FIBRILLATION ABLATION;  Surgeon: Vickie Epley, MD;  Location: [facility];  Service: Cardiovascular;  Laterality: N/A;  ? BAND HEMORRHOIDECTOMY  2012  ? Dr Earlean Shawl  ? CARDIOVERSION N/A 04/08/2021  ?  Procedure: CARDIOVERSION;  Surgeon: Nelva Bush, MD;  Location: [facility];  Service: Cardiovascular;  Laterality: N/A;  ? [place]  ? COLONOSCOPY  06/18/2011  ? CYSTO/  BILATERAL PYELOGRAM RETROGRADES/  TRANSOBTURATOR SLING  03/08/2006  ? EXCISION OF SKIN TAG N/A 02/13/2016  ? Procedure: ANAL SKIN TAG EXCISION;  Surgeon: [name], MD;  Location: [facility];  Service: General;  Laterality: N/A;  ? LAPAROSCOPIC CHOLECYSTECTOMY  05/22/2001  ? SHOULDER SURGERY    ? ? ?Current Outpatient Medications  ?Medication Sig Dispense Refill  ? acetaminophen (TYLENOL) 500 MG  tablet Take 1,000 mg by mouth every 6 (six) hours as needed for mild pain.    ? albuterol (VENTOLIN HFA) 108 (90 Base) MCG/ACT inhaler Inhale 2 puffs into the lungs every 6 (six) hours as needed for wheezing or shortness of breath.    ? buPROPion (WELLBUTRIN XL) 300 MG 24 hr tablet Take 300 mg by mouth every morning.     ? butalbital-acetaminophen-caffeine (FIORICET) 50-325-40 MG tablet Take 1 tablet by mouth 2 (two) times daily as needed for headache.    ? carvedilol (COREG) 25 MG tablet Take 2 tablets (50 mg total) by mouth 2 (two) times daily. 90 tablet 3  ? dicyclomine (BENTYL) 10 MG capsule Take one tablet by mouth once to twice daily as needed 90 capsule 3  ? diphenhydrAMINE (BENADRYL) 25 MG tablet Take 25 mg by mouth at bedtime.    ? ELIQUIS 5 MG TABS tablet TAKE 1 TABLET BY MOUTH TWICE DAILY 60 tablet 9  ? escitalopram (LEXAPRO) 20 MG tablet Take 20 mg by mouth every morning.     ? estradiol (ESTRACE) 2 MG tablet Take 2 mg by mouth daily.    ? fenofibrate micronized (LOFIBRA) 200 MG capsule Take 200 mg by mouth daily before breakfast.   4  ? flecainide (TAMBOCOR) 50 MG tablet TAKE 1 TABLET BY MOUTH TWICE DAILY 180 tablet 2  ? fluticasone (FLONASE) 50 MCG/ACT nasal spray Place 1 spray into both nostrils daily.    ? HYDROcodone bit-homatropine (HYCODAN) 5-1.5 MG/5ML syrup Take 5 mLs by mouth every 6 (six) hours as needed for cough. 100 mL 0  ? ibuprofen (ADVIL) 200 MG tablet Take 600-800 mg by mouth every 6 (six) hours as needed for moderate pain.    ? linaclotide (LINZESS) 72 MCG capsule Take 72 mcg by mouth daily as needed (constipation).    ? loratadine (CLARITIN) 10 MG tablet Take 10 mg by mouth daily.    ? losartan (COZAAR) 50 MG tablet Take 1 tablet (50 mg total) by mouth daily. 30 tablet 2  ? magnesium oxide (MAG-OX) 400 (240 Mg) MG tablet Take 400 mg by mouth daily.    ? meclizine (ANTIVERT) 25 MG tablet Take 1 tablet (25 mg total) by mouth 3 (three) times daily as needed for dizziness. 30 tablet 0  ?  mometasone (ELOCON) 0.1 % ointment Apply 1 application. topically 2 (two) times daily as needed (rash).    ? MOUNJARO 5 MG/0.5ML Pen Inject into the skin.    ? omega-3 acid ethyl esters (LOVAZA) 1 g capsule Take 1 capsule (1 g total) by mouth 2 (two) times daily. 60 capsule 5  ? ondansetron (ZOFRAN-ODT) 8 MG disintegrating tablet Take 8 mg by mouth every 8 (eight) hours as needed.    ? pantoprazole (PROTONIX) 40 MG tablet Take 1 tablet (40 mg total) by mouth daily. 45 tablet 0  ? progesterone (PROMETRIUM) 200 MG capsule  Take 200 mg by mouth at bedtime.    ? tacrolimus (PROTOPIC) 0.1 % ointment Apply 1 application. topically 2 (two) times daily as needed (rash).    ? triamcinolone (KENALOG) 0.1 % Apply 1 application. topically 2 (two) times daily as needed (rash).    ? triamterene-hydrochlorothiazide (MAXZIDE-25) 37.5-25 MG per tablet Take 0.5 tablets by mouth every morning.     ? valACYclovir (VALTREX) 1000 MG tablet Take 1,000 mg by mouth 2 (two) times daily as needed (cold sores).    ? zolpidem (AMBIEN) 10 MG tablet Take 10 mg by mouth at bedtime as needed for sleep.    ? colchicine 0.6 MG tablet Take 1 tablet (0.6 mg total) by mouth 2 (two) times daily for 5 days. 10 tablet 0  ? omeprazole (PRILOSEC) 40 MG capsule Take 1 capsule (40 mg total) by mouth in the morning and at bedtime. 60 capsule 1  ? ?No current facility-administered medications for this encounter.  ? ? ?Allergies  ?Allergen Reactions  ? Metformin Hcl   ?  Other reaction(s): bloating ?Other reaction(s): bloating ?  ? Venlafaxine   ?  Sleepy  ? ? ?Social History  ? ?Socioeconomic History  ? Marital status: Married  ?  Spouse name: [name]  ? Number of children: 2  ? Years of education: RN  ? Highest education level: Not on file  ?Occupational History  ? Occupation: Therapist, sports  ?Tobacco Use  ? Smoking status: Never  ? Smokeless tobacco: Never  ? Tobacco comments:  ?  Never smoke 08/26/21  ?Vaping Use  ? Vaping Use: Never used  ?Substance and Sexual Activity  ?  Alcohol use: No  ?  Alcohol/week: 0.0 standard drinks  ? Drug use: No  ? Sexual activity: Not on file  ?Other Topics Concern  ? Not on file  ?Social History Narrative  ? Lives in [place].  Owns several

## 2021-09-03 ENCOUNTER — Telehealth: Payer: Self-pay | Admitting: Physician Assistant

## 2021-09-03 NOTE — Telephone Encounter (Signed)
Paged by answering service.  Patient with recent ablation.  She was doing fine after ablations up until today when starting to having some fluttering sensation.  She is short of breath with this. This been intermittent.  I have recommended continue to take current medication.  If symptoms get really worse overnight go to emergency room.  If persistent stable symptoms until tomorrow, call office for further evaluation appointment.  ?

## 2021-09-05 ENCOUNTER — Encounter: Payer: Self-pay | Admitting: Cardiology

## 2021-09-13 DIAGNOSIS — G4733 Obstructive sleep apnea (adult) (pediatric): Secondary | ICD-10-CM | POA: Diagnosis not present

## 2021-09-14 ENCOUNTER — Encounter: Payer: Self-pay | Admitting: Gastroenterology

## 2021-09-15 DIAGNOSIS — R5383 Other fatigue: Secondary | ICD-10-CM | POA: Diagnosis not present

## 2021-09-15 DIAGNOSIS — R051 Acute cough: Secondary | ICD-10-CM | POA: Diagnosis not present

## 2021-09-15 DIAGNOSIS — G4733 Obstructive sleep apnea (adult) (pediatric): Secondary | ICD-10-CM | POA: Diagnosis not present

## 2021-09-15 DIAGNOSIS — Z1152 Encounter for screening for COVID-19: Secondary | ICD-10-CM | POA: Diagnosis not present

## 2021-09-15 DIAGNOSIS — R0981 Nasal congestion: Secondary | ICD-10-CM | POA: Diagnosis not present

## 2021-09-15 DIAGNOSIS — J209 Acute bronchitis, unspecified: Secondary | ICD-10-CM | POA: Diagnosis not present

## 2021-09-19 ENCOUNTER — Encounter: Payer: Self-pay | Admitting: Cardiology

## 2021-09-29 ENCOUNTER — Ambulatory Visit (INDEPENDENT_AMBULATORY_CARE_PROVIDER_SITE_OTHER): Payer: 59 | Admitting: Neurology

## 2021-09-29 ENCOUNTER — Encounter: Payer: Self-pay | Admitting: Neurology

## 2021-09-29 VITALS — BP 129/87 | HR 79 | Ht 65.0 in | Wt 217.4 lb

## 2021-09-29 DIAGNOSIS — Z9989 Dependence on other enabling machines and devices: Secondary | ICD-10-CM

## 2021-09-29 DIAGNOSIS — G4733 Obstructive sleep apnea (adult) (pediatric): Secondary | ICD-10-CM | POA: Diagnosis not present

## 2021-09-29 NOTE — Progress Notes (Signed)
Subjective:  ?  ?Patient ID: [name] Ross is a 56 y.o. female. ? ?HPI ? ? ? ?Interim history:  ? ?[name] Ross is a 56 year old right-handed woman with an underlying medical history of vertigo, hyperlipidemia, hypertension, A fib with s/p ablation in March 2023, depression, restless leg syndrome, migraine headaches, allergic rhinitis, and obesity, who presents for follow-up consultation of her obstructive sleep apnea after interim testing and starting new equipment.  The patient is unaccompanied today.  I last saw her on 04/04/2019, at which time she reported having difficulty falling asleep.  She was compliant with her CPAP of 9 cm.  I suggested we increase her set pressure to 10 cm due to borderline elevated residual AHI. ? ?She saw [name] Ross on 06/16/2021, at which time she was compliant with her CPAP of 10 cm, but her machine showed an error message that the motor life had exceeded.  She was advised to proceed with a home sleep test.  She had a home sleep test on 06/30/2021 which confirmed severe obstructive sleep apnea with an AHI of 50.8/h, O2 nadir 78% with moderate to loud snoring detected.  She was prescribed a new CPAP machine at 10 cm.  Her set up date was 07/16/2021.  She has a ResMed AirSense 10 AutoSet machine. ? ?Today, 09/29/2021: I reviewed her AutoPap compliance data from 07/16/2021 through 08/14/2021, which is a total of 30 days, during which time she used her machine 29 days with percent use days greater than 4 hours at 97%, indicating excellent compliance with an average usage of 10 hours and 55 minutes, residual AHI 0.8/h, at goal, leak on the higher side with the 95th percentile at 18.5 L/min on a pressure of 10 cm with EPR of 3.  She reports doing well with her new machine.  She has changed her mask from an under the nose style to home with a medium AirTouch fullface mask.  The under the nose fullface mask was irritating as it had [place] and she sweats a lot at night.  She is working on weight loss and  recently changed from [employer] to [employer].  She had good success with Ozempic but the insurance would not cover it any longer.  She just recently started [name] Ross.  She is consistent with her CPAP therapy.  We talked about inspire as she had some questions.  She is willing to continue with her CPAP for now and continue to work on weight loss.  I explained to her the indications for inspire.  She is currently on amoxicillin for total of 7 days for a sinus infection.  She started having symptoms last week and is also taking Zyrtec-D. ? ?Previously:  ? ?04/04/19: (She) was originally diagnosed with obstructive sleep apnea in 2010.  She had sleep study testing in July 2017 which indicated moderate obstructive sleep apnea with an AHI of 18/h.  O2 nadir was 95%.  She had a CPAP titration study in August 2017 and did well with CPAP of 9 cm.  She has been using her CPAP.  I reviewed her CPAP compliance data from 03/04/2019 through 04/02/2019 which is a total of 30 days, during which time she used her machine every night with percent use days greater than 4 hours at 100%, indicating superb compliance with an extended use on average of 10 hours and 14 minutes, residual AHI borderline at 5.6/h, leak on the low side with a 95th percentile at 3.1 L/min on a pressure of 9 cm with EPR  of 3.  Her Epworth sleepiness score is 7 out of 24, fatigue severity score is 46 out of 63.  She reports having longer standing issues falling asleep.  She has not tried melatonin.  She does not like to take Ambien because of side effects including excessive spending and excessive shopping.  She does like to watch TV or use her cell phone and watch videos on her phone while in bed.  She tries to go to bed around 10 but does not fall asleep until 1 AM typically.  Rise time is around 9 or 10 AM typically.  She is buying her supplies directly from the DME company.  She uses nasal pillows.  Sometimes she feels she has to tighten the headgear and it leaves  marks on her face in the morning. I reviewed your virtual visit note from 03/13/2019. ?She reports a recent bout of vertigo about 3 weeks ago. ?  ?02/12/2015: 55 year old right-handed woman with an underlying medical history of vertigo, hyperlipidemia, hypertension, rhinitis, depression, obesity, restless leg syndrome and PLMS, who had a sleep study in 2010 which showed mild to moderate obstructive sleep apnea and she was placed on CPAP treatment. She uses CPAP for a few months but stopped using it. She found it hard to use and found it difficult to tolerate. She stopped using it for about 4 years but recently tried it a few more times, most recently some 3 weeks ago. I reviewed her prior sleep study results from 11/12/2008 which was a diagnostic polysomnogram. Sleep efficiency was 68.7%. Sleep latency was 17.5 minutes. She had an increased percentage of stage II sleep, a mildly decreased percentage of slow-wave sleep, an increased percentage of stage I sleep and near absence of REM sleep. Total AHI was 13.9 per hour, rising to 16.6 per hour during supine sleep. Lowest oxygen saturation was 87%. She was invited back for sleep study with CPAP. She had this on 12/06/2008: Sleep efficiency was 96.3%, sleep latency was quick at 1.5 minutes. REM percentage was 8.4%, she had an increased percentage of slow-wave sleep, she was treated with CPAP from 4 cm to 11 cm. On the final pressure her AHI was 1.8 per hour. She was prescribed CPAP therapy at 11 cm. I reviewed a CPAP compliance download from 02/09/2009 through 03/08/2009 which is a total of 28 days during which time she used her machine 21 days with percent used days greater than 4 hours at 46%, indicating suboptimal compliance, residual AHI was 0.9, leak was low, set pressure at 11 cm. She has some restless leg symptoms. 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 At the time of her previous study she weighed 198 pounds. She has gained weight, particularly started  gaining weight several years ago after her father committed suicide in 2008. She lost her mother last year in November. She is on Lexapro generic for her depression which is working fairly well for her. She is also on Wellbutrin XL 300 mg daily. She had tried weight loss medication in the past but she had side effects including palpitations. She did lose some weight on the medication. She would like to be able to use something else that does not cause her side effects. She has no overt family history of obstructive sleep apnea. Her husband has been diagnosed with sleep apnea and uses a CPAP machine and has pushed her to be reevaluated.  Her Epworth sleepiness score is 12 out of 24 today, her fatigue sco she is increasing  her water intake. She is a nonsmoker. She drinks alcohol very rarely.  ?I reviewed your office note from 01/04/2015 which you kindly included. ? ? ?Her Past Medical History Is Significant For: ?Past Medical History:  ?Diagnosis Date  ? Anal skin tag   ? Atrial fibrillation ([place])   ? Depression   ? Diverticulosis   ? Diverticulosis, sigmoid   ? Dyslipidemia   ? GERD (gastroesophageal reflux disease)   ? Hemorrhoids   ? internal and external  ? History of diverticulitis   ? 10/ 2016  ? History of galactorrhea   ? History of stress test   ? a. Pt reports h/o stress test and echo by cardiologist in [place] (no records in [place]).  Both reportedly nl.  ? Hypertension   ? IBS (irritable bowel syndrome)   ? Morbid obesity ([place])   ? PONV (postoperative nausea and vomiting)   ? severe  ? Rash   ? elbows and behind knees  ? Restless leg syndrome   ? Sleep apnea   ? cpap sometimes  ? Wears glasses   ? ? ?Her Past Surgical History Is Significant For: ?Past Surgical History:  ?Procedure Laterality Date  ? ATRIAL FIBRILLATION ABLATION N/A 07/31/2021  ? Procedure: ATRIAL FIBRILLATION ABLATION;  Surgeon: Vickie Epley, MD;  Location: [facility];  Service: Cardiovascular;  Laterality: N/A;  ? BAND  HEMORRHOIDECTOMY  2012  ? Dr Earlean Shawl  ? CARDIOVERSION N/A 04/08/2021  ? Procedure: CARDIOVERSION;  Surgeon: Nelva Bush, MD;  Location: [facility];  Service: Cardiovascular;  Laterality: N/A;  ? [place]

## 2021-09-29 NOTE — Patient Instructions (Addendum)
Please continue using your CPAP regularly. While your insurance requires that you use CPAP at least 4 hours each night on 70% of the nights, I recommend, that you not skip any nights and use it throughout the night if you can. Getting used to CPAP and staying with the treatment long term does take time and patience and discipline. Untreated obstructive sleep apnea when it is moderate to severe can have an adverse impact on cardiovascular health and raise her risk for heart disease, arrhythmias, hypertension, congestive heart failure, stroke and diabetes. Untreated obstructive sleep apnea causes sleep disruption, nonrestorative sleep, and sleep deprivation. This can have an impact on your day to day functioning and cause daytime sleepiness and impairment of cognitive function, memory loss, mood disturbance, and problems focussing. Using CPAP regularly can improve these symptoms. ?We can see you in 1 year, you can see Ward Givens, NP.  ? ?

## 2021-09-30 NOTE — Progress Notes (Deleted)
Electrophysiology Office Follow up Visit Note:    Date:  09/30/2021   ID:  [name] Ross, DOB 09-13-1965, MRN [id number]  PCP:  Donnajean Lopes, MD  [facility] Cardiologist:  Kate Sable, MD  [facility] Electrophysiologist:  Vickie Epley, MD    Interval History:    [name] Ross is a 56 y.o. female who presents for a follow up visit after AF ablation 07/31/2021. During the ablation, the veins and posterior wall and CTI were ablated.          Past Medical History:  Diagnosis Date   Anal skin tag    Atrial fibrillation ([place])    Depression    Diverticulosis    Diverticulosis, sigmoid    Dyslipidemia    GERD (gastroesophageal reflux disease)    Hemorrhoids    internal and external   History of diverticulitis    10/ 2016   History of galactorrhea    History of stress test    a. Pt reports h/o stress test and echo by cardiologist in [place] (no records in [place]).  Both reportedly nl.   Hypertension    IBS (irritable bowel syndrome)    Morbid obesity (HCC)    PONV (postoperative nausea and vomiting)    severe   Rash    elbows and behind knees   Restless leg syndrome    Sleep apnea    cpap sometimes   Wears glasses     Past Surgical History:  Procedure Laterality Date   ATRIAL FIBRILLATION ABLATION N/A 07/31/2021   Procedure: ATRIAL FIBRILLATION ABLATION;  Surgeon: Vickie Epley, MD;  Location: [facility];  Service: Cardiovascular;  Laterality: N/A;   BAND HEMORRHOIDECTOMY  2012   Dr Earlean Shawl   CARDIOVERSION N/A 04/08/2021   Procedure: CARDIOVERSION;  Surgeon: Nelva Bush, MD;  Location: [facility];  Service: Cardiovascular;  Laterality: N/A;   [place]   COLONOSCOPY  06/18/2011   CYSTO/  BILATERAL PYELOGRAM RETROGRADES/  TRANSOBTURATOR SLING  03/08/2006   EXCISION OF SKIN TAG N/A 02/13/2016   Procedure: ANAL SKIN TAG EXCISION;  Surgeon: [name], MD;  Location: [place] [facility];  Service: General;  Laterality:  N/A;   LAPAROSCOPIC CHOLECYSTECTOMY  05/22/2001   SHOULDER SURGERY      Current Medications: No outpatient medications have been marked as taking for the 10/01/21 encounter (Appointment) with Vickie Epley, MD.     Allergies:   Metformin hcl and Venlafaxine   Social History   Socioeconomic History   Marital status: Married    Spouse name: [name]   Number of children: 2   Years of education: RN   Highest education level: Not on file  Occupational History   Occupation: RN  Tobacco Use   Smoking status: Never   Smokeless tobacco: Never   Tobacco comments:    Never smoke 08/26/21  Vaping Use   Vaping Use: Never used  Substance and Sexual Activity   Alcohol use: No    Alcohol/week: 0.0 standard drinks   Drug use: No   Sexual activity: Not on file  Other Topics Concern   Not on file  Social History Narrative   Lives in [place].  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  Occasionally drinks coffee but not a heavy caffeine drinker.     Social Determinants of Health   Financial Resource Strain: Not on file  Food Insecurity: Not on file  Transportation Needs: Not on file  Physical Activity: Not on file  Stress: Not on file  Social Connections: Not on file     Family History: The patient's family history includes Atrial fibrillation in her mother; Breast cancer in an other family member; Colon cancer in her mother; Diabetes in her father, mother, sister, and sister; Hypertension in her father and mother; Skin cancer in her father; Suicidality in her father. There is no history of Sleep apnea.  ROS:   Please see the history of present illness.    All other systems reviewed and are negative.  EKGs/Labs/Other Studies Reviewed:    The following studies were reviewed today: ***  EKG:  The ekg ordered today demonstrates ***  Recent Labs: 04/05/2021: ALT 14; B Natriuretic Peptide 92.2 04/06/2021: Magnesium 2.0; TSH 3.374 07/11/2021: BUN 16; Creatinine,  Ser 0.85; Hemoglobin 11.1; Platelets 573; Potassium 4.8; Sodium 141  Recent Lipid Panel    Component Value Date/Time   CHOL 204 (H) 12/20/2019 0502   TRIG 510 (H) 12/20/2019 0502   HDL 39 (L) 12/20/2019 0502   CHOLHDL 5.2 12/20/2019 0502   VLDL UNABLE TO CALCULATE IF TRIGLYCERIDE OVER 400 mg/dL 12/20/2019 0502   LDLCALC UNABLE TO CALCULATE IF TRIGLYCERIDE OVER 400 mg/dL 12/20/2019 0502   LDLDIRECT 66.8 12/20/2019 0502    Physical Exam:    VS:  There were no vitals taken for this visit.    Wt Readings from Last 3 Encounters:  09/29/21 217 lb 6.4 oz (98.6 kg)  08/26/21 217 lb 12.8 oz (98.8 kg)  07/31/21 210 lb (95.3 kg)     GEN: *** Well nourished, well developed in no acute distress HEENT: Normal NECK: No JVD; No carotid bruits LYMPHATICS: No lymphadenopathy CARDIAC: ***RRR, no murmurs, rubs, gallops RESPIRATORY:  Clear to auscultation without rales, wheezing or rhonchi  ABDOMEN: Soft, non-tender, non-distended MUSCULOSKELETAL:  No edema; No deformity  SKIN: Warm and dry NEUROLOGIC:  Alert and oriented x 3 PSYCHIATRIC:  Normal affect        ASSESSMENT:    1. Typical atrial flutter (HCC)   2. Persistent atrial fibrillation ([place])   3. OSA (obstructive sleep apnea)   4. Essential hypertension    PLAN:    In order of problems listed above:    Flecainide continue?        Total time spent with patient today *** minutes. This includes reviewing records, evaluating the patient and coordinating care.   Medication Adjustments/Labs and Tests Ordered: Current medicines are reviewed at length with the patient today.  Concerns regarding medicines are outlined above.  No orders of the defined types were placed in this encounter.  No orders of the defined types were placed in this encounter.    Signed, Lars Mage, MD, [facility], [facility] 09/30/2021 7:14 PM    Electrophysiology [facility]

## 2021-10-01 ENCOUNTER — Ambulatory Visit: Payer: 59 | Admitting: Cardiology

## 2021-10-01 DIAGNOSIS — I10 Essential (primary) hypertension: Secondary | ICD-10-CM

## 2021-10-01 DIAGNOSIS — I4819 Other persistent atrial fibrillation: Secondary | ICD-10-CM

## 2021-10-01 DIAGNOSIS — I483 Typical atrial flutter: Secondary | ICD-10-CM

## 2021-10-01 DIAGNOSIS — G4733 Obstructive sleep apnea (adult) (pediatric): Secondary | ICD-10-CM

## 2021-10-02 ENCOUNTER — Encounter: Payer: Self-pay | Admitting: Cardiology

## 2021-10-13 DIAGNOSIS — G4733 Obstructive sleep apnea (adult) (pediatric): Secondary | ICD-10-CM | POA: Diagnosis not present

## 2021-10-15 ENCOUNTER — Ambulatory Visit (INDEPENDENT_AMBULATORY_CARE_PROVIDER_SITE_OTHER): Payer: 59 | Admitting: Cardiology

## 2021-10-15 ENCOUNTER — Encounter: Payer: Self-pay | Admitting: Cardiology

## 2021-10-15 VITALS — BP 118/81 | HR 78 | Ht 66.0 in | Wt 217.0 lb

## 2021-10-15 DIAGNOSIS — Z79899 Other long term (current) drug therapy: Secondary | ICD-10-CM

## 2021-10-15 DIAGNOSIS — G4733 Obstructive sleep apnea (adult) (pediatric): Secondary | ICD-10-CM

## 2021-10-15 DIAGNOSIS — Z9989 Dependence on other enabling machines and devices: Secondary | ICD-10-CM | POA: Diagnosis not present

## 2021-10-15 DIAGNOSIS — I4819 Other persistent atrial fibrillation: Secondary | ICD-10-CM | POA: Diagnosis not present

## 2021-10-15 DIAGNOSIS — I483 Typical atrial flutter: Secondary | ICD-10-CM | POA: Diagnosis not present

## 2021-10-15 DIAGNOSIS — I10 Essential (primary) hypertension: Secondary | ICD-10-CM | POA: Diagnosis not present

## 2021-10-15 NOTE — Patient Instructions (Signed)
Medications: Your physician recommends that you continue on your current medications as directed. Please refer to the Current Medication list given to you today. *If you need a refill on your cardiac medications before your next appointment, please call your pharmacy*  Lab Work: None. If you have labs (blood work) drawn today and your tests are completely normal, you will receive your results only by: [place] (if you have MyChart) OR A paper copy in the mail If you have any lab test that is abnormal or we need to change your treatment, we will call you to review the results.  Testing/Procedures: None.  Follow-Up: At [facility], you and your health needs are our priority.  As part of our continuing mission to provide you with exceptional heart care, we have created designated Provider Care Teams.  These Care Teams include your primary Cardiologist (physician) and Advanced Practice Providers (APPs -  Physician Assistants and Nurse Practitioners) who all work together to provide you with the care you need, when you need it.  Your physician wants you to follow-up in: 6 months with one of the following Advanced Practice Providers on your designated Care Team:    Ignacia Bayley, NP [name] PA Cadence Kathlen Mody PA    You will receive a reminder letter in the mail two months in advance. If you don't receive a letter, please call our office to schedule the follow-up appointment.  We recommend signing up for the patient portal called "MyChart".  Sign up information is provided on this After Visit Summary.  MyChart is used to connect with patients for Virtual Visits (Telemedicine).  Patients are able to view lab/test results, encounter notes, upcoming appointments, etc.  Non-urgent messages can be sent to your provider as well.   To learn more about what you can do with MyChart, go to [email or web address].    Any Other Special Instructions Will Be Listed Below (If Applicable).

## 2021-10-15 NOTE — Progress Notes (Signed)
Electrophysiology Office Follow up Visit Note:    Date:  10/15/2021   ID:  [name] Ross, DOB 1965/12/10, MRN [id number]  PCP:  Donnajean Lopes, MD  [facility] Cardiologist:  Kate Sable, MD  [facility] Electrophysiologist:  Vickie Epley, MD    Interval History:    [name] Ross is a 56 y.o. female who presents for a follow up visit.  She underwent an A-fib ablation on July 31, 2021.  During the ablation the veins and posterior wall were isolated.  CTI dependent flutter was also ablated.  After the procedure she initially noted some irregular heartbeats.  Today she tells me she is doing well.  No sustained episodes of atrial fibrillation.  This is a big improvement since prior to the ablation procedure.  She intermittently will feel some skipped heartbeats but it never materialized is not a full atrial fibrillation.  She continues to take flecainide and Eliquis for stroke prophylaxis.      Past Medical History:  Diagnosis Date   Anal skin tag    Atrial fibrillation ([place])    Depression    Diverticulosis    Diverticulosis, sigmoid    Dyslipidemia    GERD (gastroesophageal reflux disease)    Hemorrhoids    internal and external   History of diverticulitis    10/ 2016   History of galactorrhea    History of stress test    a. Pt reports h/o stress test and echo by cardiologist in [place] (no records in [place]).  Both reportedly nl.   Hypertension    IBS (irritable bowel syndrome)    Morbid obesity (HCC)    PONV (postoperative nausea and vomiting)    severe   Rash    elbows and behind knees   Restless leg syndrome    Sleep apnea    cpap sometimes   Wears glasses     Past Surgical History:  Procedure Laterality Date   ATRIAL FIBRILLATION ABLATION N/A 07/31/2021   Procedure: ATRIAL FIBRILLATION ABLATION;  Surgeon: Vickie Epley, MD;  Location: [facility];  Service: Cardiovascular;  Laterality: N/A;   BAND HEMORRHOIDECTOMY  2012   Dr Earlean Shawl    CARDIOVERSION N/A 04/08/2021   Procedure: CARDIOVERSION;  Surgeon: Nelva Bush, MD;  Location: [facility];  Service: Cardiovascular;  Laterality: N/A;   [place]   COLONOSCOPY  06/18/2011   CYSTO/  BILATERAL PYELOGRAM RETROGRADES/  TRANSOBTURATOR SLING  03/08/2006   EXCISION OF SKIN TAG N/A 02/13/2016   Procedure: ANAL SKIN TAG EXCISION;  Surgeon: [name], MD;  Location: [facility];  Service: General;  Laterality: N/A;   LAPAROSCOPIC CHOLECYSTECTOMY  05/22/2001   SHOULDER SURGERY      Current Medications: Current Meds  Medication Sig   acetaminophen (TYLENOL) 500 MG tablet Take 1,000 mg by mouth every 6 (six) hours as needed for mild pain.   albuterol (VENTOLIN HFA) 108 (90 Base) MCG/ACT inhaler Inhale 2 puffs into the lungs every 6 (six) hours as needed for wheezing or shortness of breath.   buPROPion (WELLBUTRIN XL) 300 MG 24 hr tablet Take 300 mg by mouth every morning.    butalbital-acetaminophen-caffeine (FIORICET) 50-325-40 MG tablet Take 1 tablet by mouth 2 (two) times daily as needed for headache.   carvedilol (COREG) 25 MG tablet Take 2 tablets (50 mg total) by mouth 2 (two) times daily.   cetirizine-pseudoephedrine (ZYRTEC-D) 5-120 MG tablet Take 1 tablet by mouth 2 (two) times daily.  dicyclomine (BENTYL) 10 MG capsule Take one tablet by mouth once to twice daily as needed   diphenhydrAMINE (BENADRYL) 25 MG tablet Take 25 mg by mouth at bedtime.   ELIQUIS 5 MG TABS tablet TAKE 1 TABLET BY MOUTH TWICE DAILY   escitalopram (LEXAPRO) 20 MG tablet Take 20 mg by mouth every morning.    estradiol (ESTRACE) 2 MG tablet Take 2 mg by mouth daily.   fenofibrate micronized (LOFIBRA) 200 MG capsule Take 200 mg by mouth daily before breakfast.    flecainide (TAMBOCOR) 50 MG tablet TAKE 1 TABLET BY MOUTH TWICE DAILY   fluticasone (FLONASE) 50 MCG/ACT nasal spray Place 1 spray into both nostrils daily.   HYDROcodone bit-homatropine (HYCODAN) 5-1.5 MG/5ML  syrup Take 5 mLs by mouth every 6 (six) hours as needed for cough.   ibuprofen (ADVIL) 200 MG tablet Take 600-800 mg by mouth every 6 (six) hours as needed for moderate pain.   linaclotide (LINZESS) 72 MCG capsule Take 72 mcg by mouth daily as needed (constipation).   loratadine (CLARITIN) 10 MG tablet Take 10 mg by mouth daily.   losartan (COZAAR) 50 MG tablet Take 1 tablet (50 mg total) by mouth daily.   magnesium oxide (MAG-OX) 400 (240 Mg) MG tablet Take 400 mg by mouth daily.   meclizine (ANTIVERT) 25 MG tablet Take 1 tablet (25 mg total) by mouth 3 (three) times daily as needed for dizziness.   mometasone (ELOCON) 0.1 % ointment Apply 1 application. topically 2 (two) times daily as needed (rash).   MOUNJARO 5 MG/0.5ML Pen Inject 7.5 mg into the skin.   omega-3 acid ethyl esters (LOVAZA) 1 g capsule Take 1 capsule (1 g total) by mouth 2 (two) times daily.   ondansetron (ZOFRAN-ODT) 8 MG disintegrating tablet Take 8 mg by mouth every 8 (eight) hours as needed.   progesterone (PROMETRIUM) 200 MG capsule Take 200 mg by mouth at bedtime.   tacrolimus (PROTOPIC) 0.1 % ointment Apply 1 application. topically 2 (two) times daily as needed (rash).   triamcinolone (KENALOG) 0.1 % Apply 1 application. topically 2 (two) times daily as needed (rash).   triamterene-hydrochlorothiazide (MAXZIDE-25) 37.5-25 MG per tablet Take 0.5 tablets by mouth every morning.    valACYclovir (VALTREX) 1000 MG tablet Take 1,000 mg by mouth 2 (two) times daily as needed (cold sores).   zolpidem (AMBIEN) 10 MG tablet Take 10 mg by mouth at bedtime as needed for sleep.     Allergies:   Metformin hcl and Venlafaxine   Social History   Socioeconomic History   Marital status: Married    Spouse name: [name]   Number of children: 2   Years of education: RN   Highest education level: Not on file  Occupational History   Occupation: RN  Tobacco Use   Smoking status: Never   Smokeless tobacco: Never   Tobacco comments:     Never smoke 08/26/21  Vaping Use   Vaping Use: Never used  Substance and Sexual Activity   Alcohol use: No    Alcohol/week: 0.0 standard drinks   Drug use: No   Sexual activity: Not on file  Other Topics Concern   Not on file  Social History Narrative   Lives in [place].  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  Occasionally drinks coffee but not a heavy caffeine drinker.     Social Determinants of Health   Financial Resource Strain: Not on file  Food Insecurity: Not on file  Transportation  Needs: Not on file  Physical Activity: Not on file  Stress: Not on file  Social Connections: Not on file     Family History: The patient's family history includes Atrial fibrillation in her mother; Breast cancer in an other family member; Colon cancer in her mother; Diabetes in her father, mother, sister, and sister; Hypertension in her father and mother; Skin cancer in her father; Suicidality in her father. There is no history of Sleep apnea.  ROS:   Please see the history of present illness.    All other systems reviewed and are negative.  EKGs/Labs/Other Studies Reviewed:    The following studies were reviewed today:   EKG:  The ekg ordered today demonstrates sinus rhythm.  PR interval 176 ms.  QRS duration 82 ms.  Recent Labs: 04/05/2021: ALT 14; B Natriuretic Peptide 92.2 04/06/2021: Magnesium 2.0; TSH 3.374 07/11/2021: BUN 16; Creatinine, Ser 0.85; Hemoglobin 11.1; Platelets 573; Potassium 4.8; Sodium 141  Recent Lipid Panel    Component Value Date/Time   CHOL 204 (H) 12/20/2019 0502   TRIG 510 (H) 12/20/2019 0502   HDL 39 (L) 12/20/2019 0502   CHOLHDL 5.2 12/20/2019 0502   VLDL UNABLE TO CALCULATE IF TRIGLYCERIDE OVER 400 mg/dL 12/20/2019 0502   LDLCALC UNABLE TO CALCULATE IF TRIGLYCERIDE OVER 400 mg/dL 12/20/2019 0502   LDLDIRECT 66.8 12/20/2019 0502    Physical Exam:    VS:  BP 118/81   Pulse 78   Ht '5\' 6"'$  (1.676 m)   Wt 217 lb (98.4 kg)   SpO2 97%    BMI 35.02 kg/m     Wt Readings from Last 3 Encounters:  10/15/21 217 lb (98.4 kg)  09/29/21 217 lb 6.4 oz (98.6 kg)  08/26/21 217 lb 12.8 oz (98.8 kg)     GEN:  Well nourished, well developed in no acute distress.  Obese HEENT: Normal NECK: No JVD; No carotid bruits LYMPHATICS: No lymphadenopathy CARDIAC: RRR, no murmurs, rubs, gallops RESPIRATORY:  Clear to auscultation without rales, wheezing or rhonchi  ABDOMEN: Soft, non-tender, non-distended MUSCULOSKELETAL:  No edema; No deformity  SKIN: Warm and dry NEUROLOGIC:  Alert and oriented x 3 PSYCHIATRIC:  Normal affect        ASSESSMENT:    1. Persistent atrial fibrillation ([place])   2. Typical atrial flutter ([place])   3. OSA on CPAP   4. Primary hypertension   5. Encounter for long-term (current) use of high-risk medication    PLAN:    In order of problems listed above:   #Persistent atrial fibrillation Doing well after her ablation.  Maintaining normal rhythm.  From history alone, it sounds like she is having PACs and potentially short runs of AT.  I would recommend she continue the flecainide for now.  I will have her touch base with our clinic in about 6 months.  If her heart rhythms continue to be controlled, we will plan to stop flecainide at the 12-monthfollow-up.  She will continue Eliquis indefinitely given her history of persistent atrial fibrillation and elevated CHA2DS2-VASc.  #Obstructive sleep apnea Continued CPAP use encouraged  #Hypertension Controlled.  Continue current regimen.  Follow-up with APP in 6 months or sooner as needed.   Medication Adjustments/Labs and Tests Ordered: Current medicines are reviewed at length with the patient today.  Concerns regarding medicines are outlined above.  Orders Placed This Encounter  Procedures   EKG 12-Lead   No orders of the defined types were placed in this encounter.    Signed,  Lars Mage, MD, [facility], [facility] 10/15/2021 1:50 PM     Electrophysiology [facility]

## 2021-10-22 ENCOUNTER — Other Ambulatory Visit: Payer: Self-pay | Admitting: Physician Assistant

## 2021-10-22 ENCOUNTER — Other Ambulatory Visit: Payer: Self-pay | Admitting: Cardiology

## 2021-10-22 NOTE — Telephone Encounter (Signed)
Refill Request.  

## 2021-10-22 NOTE — Telephone Encounter (Signed)
Prescription refill request for Eliquis received. Indication:  Atrial Fib Last office visit: 10/15/21  Grayce Sessions MD Scr: 0.85 on 07/11/21 Age:  56 Weight: 98.4  Based on above findings Eliquis '5mg'$  twice daily is the appropriate dose.  Refill approved.

## 2021-10-24 ENCOUNTER — Encounter: Payer: Self-pay | Admitting: Cardiology

## 2021-10-24 ENCOUNTER — Ambulatory Visit (INDEPENDENT_AMBULATORY_CARE_PROVIDER_SITE_OTHER): Payer: 59 | Admitting: Cardiology

## 2021-10-24 VITALS — BP 102/74 | HR 79 | Ht 66.0 in | Wt 214.8 lb

## 2021-10-24 DIAGNOSIS — I10 Essential (primary) hypertension: Secondary | ICD-10-CM

## 2021-10-24 DIAGNOSIS — I480 Paroxysmal atrial fibrillation: Secondary | ICD-10-CM

## 2021-10-24 NOTE — Patient Instructions (Addendum)
Medication Instructions:   Your physician recommends that you continue on your current medications as directed. Please refer to the Current Medication list given to you today.  *If you need a refill on your cardiac medications before your next appointment, please call your pharmacy*   Follow-Up: At [facility], you and your health needs are our priority.  As part of our continuing mission to provide you with exceptional heart care, we have created designated Provider Care Teams.  These Care Teams include your primary Cardiologist (physician) and Advanced Practice Providers (APPs -  Physician Assistants and Nurse Practitioners) who all work together to provide you with the care you need, when you need it.  We recommend signing up for the patient portal called "MyChart".  Sign up information is provided on this After Visit Summary.  MyChart is used to connect with patients for Virtual Visits (Telemedicine).  Patients are able to view lab/test results, encounter notes, upcoming appointments, etc.  Non-urgent messages can be sent to your provider as well.   To learn more about what you can do with MyChart, go to [email or web address].    Your next appointment:   1 year  The format for your next appointment:   In Person  Provider:   You may see Kate Sable, MD or one of the following Advanced Practice Providers on your designated Care Team:   Murray Hodgkins, NP [name], PA-C Cadence Kathlen Mody, [place]    Other Instructions   Important Information About Sugar

## 2021-10-24 NOTE — Progress Notes (Signed)
Cardiology Office Note:    Date:  10/24/2021   ID:  [name] Ross, DOB Nov 03, 1965, MRN [id number]  PCP:  [name] Lopes, MD   [place]  Cardiologist:  [name] Sable, MD  Advanced Practice Provider:  No care team member to display Electrophysiologist:  [name] Epley, MD       Referring MD: [name] Lopes, MD   No chief complaint on file.   History of Present Illness:    [name] Ross is a 56 y.o. female with a hx of paroxysmal atrial fibrillation s/p ablation 07/2021, hypertension, GERD who presents for follow-up.  Being seen for paroxysmal A-fib.  She was very symptomatic when in atrial fibrillation on nonamenable to antiarrhythmics.  Successfully underwent ablation on July 31, 2021.  Evaluated by EP.  Ablation, flecainide was recommended to continue, also takes Eliquis for stroke prophylaxis.  States having rare episodes of skipped heartbeats lasting a few seconds but nothing as significant as prior to ablation.  Overall feeling well, blood pressure is adequately controlled.   Prior notes Echocardiogram 0/6269 normal systolic and diastolic function, EF 60 to 65%  Past Medical History:  Diagnosis Date   Anal skin tag    Atrial fibrillation (HCC)    Depression    Diverticulosis    Diverticulosis, sigmoid    Dyslipidemia    GERD (gastroesophageal reflux disease)    Hemorrhoids    internal and external   History of diverticulitis    10/ 2016   History of galactorrhea    History of stress test    a. Pt reports h/o stress test and echo by cardiologist in [place] (no records in [place]).  Both reportedly nl.   Hypertension    IBS (irritable bowel syndrome)    Morbid obesity (HCC)    PONV (postoperative nausea and vomiting)    severe   Rash    elbows and behind knees   Restless leg syndrome    Sleep apnea    cpap sometimes   Wears glasses     Past Surgical History:  Procedure Laterality Date   ATRIAL FIBRILLATION ABLATION N/A  07/31/2021   Procedure: ATRIAL FIBRILLATION ABLATION;  Surgeon: [name] Epley, MD;  Location: [facility];  Service: Cardiovascular;  Laterality: N/A;   BAND HEMORRHOIDECTOMY  2012   Dr [name] Ross   CARDIOVERSION N/A 04/08/2021   Procedure: CARDIOVERSION;  Surgeon: [name] Bush, MD;  Location: [facility];  Service: Cardiovascular;  Laterality: N/A;   [place]   COLONOSCOPY  06/18/2011   CYSTO/  BILATERAL PYELOGRAM RETROGRADES/  TRANSOBTURATOR SLING  03/08/2006   EXCISION OF SKIN TAG N/A 02/13/2016   Procedure: ANAL SKIN TAG EXCISION;  Surgeon: [name] Ruff, MD;  Location: [facility];  Service: General;  Laterality: N/A;   LAPAROSCOPIC CHOLECYSTECTOMY  05/22/2001   SHOULDER SURGERY      Current Medications: Current Meds  Medication Sig   acetaminophen (TYLENOL) 500 MG tablet Take 1,000 mg by mouth every 6 (six) hours as needed for mild pain.   albuterol (VENTOLIN HFA) 108 (90 Base) MCG/ACT inhaler Inhale 2 puffs into the lungs every 6 (six) hours as needed for wheezing or shortness of breath.   apixaban (ELIQUIS) 5 MG TABS tablet TAKE 1 TABLET BY MOUTH TWICE DAILY   Ascorbic Acid (VITAMIN C) 1000 MG tablet    buPROPion (WELLBUTRIN XL) 300 MG 24 hr tablet Take 300 mg by mouth every morning.    butalbital-acetaminophen-caffeine (  FIORICET) 50-325-40 MG tablet Take 1 tablet by mouth 2 (two) times daily as needed for headache.   carvedilol (COREG) 25 MG tablet Take 2 tablets (50 mg total) by mouth 2 (two) times daily.   dicyclomine (BENTYL) 10 MG capsule Take one tablet by mouth once to twice daily as needed   diphenhydrAMINE (BENADRYL) 25 MG tablet Take 25 mg by mouth at bedtime.   escitalopram (LEXAPRO) 20 MG tablet Take 20 mg by mouth every morning.    estradiol (ESTRACE) 2 MG tablet Take 2 mg by mouth daily.   fenofibrate micronized (LOFIBRA) 200 MG capsule Take 200 mg by mouth daily before breakfast.    fexofenadine (ALLEGRA) 180 MG tablet     flecainide (TAMBOCOR) 50 MG tablet TAKE 1 TABLET BY MOUTH TWICE DAILY   fluticasone (FLONASE) 50 MCG/ACT nasal spray Place 1 spray into both nostrils daily.   ibuprofen (ADVIL) 200 MG tablet Take 600-800 mg by mouth every 6 (six) hours as needed for moderate pain.   linaclotide (LINZESS) 72 MCG capsule Take 72 mcg by mouth daily as needed (constipation).   losartan (COZAAR) 50 MG tablet TAKE 1 TABLET BY MOUTH ONCE DAILY.   magnesium oxide (MAG-OX) 400 (240 Mg) MG tablet Take 400 mg by mouth daily.   meclizine (ANTIVERT) 25 MG tablet Take 1 tablet (25 mg total) by mouth 3 (three) times daily as needed for dizziness.   mometasone (ELOCON) 0.1 % ointment Apply 1 application. topically 2 (two) times daily as needed (rash).   MOUNJARO 10 MG/0.5ML Pen Inject into the skin.   omega-3 acid ethyl esters (LOVAZA) 1 g capsule Take 1 capsule (1 g total) by mouth 2 (two) times daily.   ondansetron (ZOFRAN-ODT) 8 MG disintegrating tablet Take 8 mg by mouth every 8 (eight) hours as needed.   progesterone (PROMETRIUM) 200 MG capsule Take 200 mg by mouth at bedtime.   tacrolimus (PROTOPIC) 0.1 % ointment Apply 1 application. topically 2 (two) times daily as needed (rash).   triamcinolone (KENALOG) 0.1 % Apply 1 application. topically 2 (two) times daily as needed (rash).   triamterene-hydrochlorothiazide (MAXZIDE-25) 37.5-25 MG per tablet Take 0.5 tablets by mouth every morning.    valACYclovir (VALTREX) 1000 MG tablet Take 1,000 mg by mouth 2 (two) times daily as needed (cold sores).   zolpidem (AMBIEN) 10 MG tablet Take 10 mg by mouth at bedtime as needed for sleep.     Allergies:   Metformin hcl and Venlafaxine   Social History   Socioeconomic History   Marital status: Married    Spouse name: [name] Ross   Number of children: 2   Years of education: RN   Highest education level: Not on file  Occupational History   Occupation: RN  Tobacco Use   Smoking status: Never   Smokeless tobacco: Never   Tobacco  comments:    Never smoke 08/26/21  Vaping Use   Vaping Use: Never used  Substance and Sexual Activity   Alcohol use: No    Alcohol/week: 0.0 standard drinks   Drug use: No   Sexual activity: Not on file  Other Topics Concern   Not on file  Social History Narrative   Lives in [place].  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  Occasionally drinks coffee but not a heavy caffeine drinker.     Social Determinants of Health   Financial Resource Strain: Not on file  Food Insecurity: Not on file  Transportation Needs: Not on file  Physical  Activity: Not on file  Stress: Not on file  Social Connections: Not on file     Family History: The patient's family history includes Atrial fibrillation in her mother; Breast cancer in an other family member; Colon cancer in her mother; Diabetes in her father, mother, sister, and sister; Hypertension in her father and mother; Skin cancer in her father; Suicidality in her father. There is no history of Sleep apnea.  ROS:   Please see the history of present illness.     All other systems reviewed and are negative.  EKGs/Labs/Other Studies Reviewed:    The following studies were reviewed today:   EKG:  EKG not ordered today.   Recent Labs: 04/05/2021: ALT 14; B Natriuretic Peptide 92.2 04/06/2021: Magnesium 2.0; TSH 3.374 07/11/2021: BUN 16; Creatinine, Ser 0.85; Hemoglobin 11.1; Platelets 573; Potassium 4.8; Sodium 141  Recent Lipid Panel    Component Value Date/Time   CHOL 204 (H) 12/20/2019 0502   TRIG 510 (H) 12/20/2019 0502   HDL 39 (L) 12/20/2019 0502   CHOLHDL 5.2 12/20/2019 0502   VLDL UNABLE TO CALCULATE IF TRIGLYCERIDE OVER 400 mg/dL 12/20/2019 0502   LDLCALC UNABLE TO CALCULATE IF TRIGLYCERIDE OVER 400 mg/dL 12/20/2019 0502   LDLDIRECT 66.8 12/20/2019 0502     Risk Assessment/Calculations:      Physical Exam:    VS:  BP 102/74   Pulse 79   Ht '5\' 6"'$  (1.676 m)   Wt 214 lb 12.8 oz (97.4 kg)   SpO2 96%    BMI 34.67 kg/m     Wt Readings from Last 3 Encounters:  10/24/21 214 lb 12.8 oz (97.4 kg)  10/15/21 217 lb (98.4 kg)  09/29/21 217 lb 6.4 oz (98.6 kg)     GEN:  Well nourished, well developed in no acute distress HEENT: Normal NECK: No JVD; No carotid bruits LYMPHATICS: No lymphadenopathy CARDIAC: RRR, no murmurs, rubs, gallops RESPIRATORY:  Clear to auscultation without rales, wheezing or rhonchi  ABDOMEN: Soft, non-tender, non-distended MUSCULOSKELETAL:  No edema; No deformity  SKIN: Warm and dry NEUROLOGIC:  Alert and oriented x 3 PSYCHIATRIC:  Normal affect   ASSESSMENT:    1. PAF (paroxysmal atrial fibrillation) ([place])   2. Primary hypertension     PLAN:    In order of problems listed above:  Paroxysmal atrial fibrillation, s/p ablation 07/2021.  CHA2DS2-VASc score 2.  Symptoms much improved since ablation.  Continue flecainide, Eliquis, Coreg., last EF 60%.  Hypertension, BP controlled.  Continue current BP meds  Follow-up in 1 year.    Medication Adjustments/Labs and Tests Ordered: Current medicines are reviewed at length with the patient today.  Concerns regarding medicines are outlined above.  No orders of the defined types were placed in this encounter.  No orders of the defined types were placed in this encounter.   Patient Instructions  Medication Instructions:   Your physician recommends that you continue on your current medications as directed. Please refer to the Current Medication list given to you today.  *If you need a refill on your cardiac medications before your next appointment, please call your pharmacy*   Follow-Up: At [facility], you and your health needs are our priority.  As part of our continuing mission to provide you with exceptional heart care, we have created designated Provider Care Teams.  These Care Teams include your primary Cardiologist (physician) and Advanced Practice Providers (APPs -  Physician Assistants and Nurse  Practitioners) who all work together to provide you with the  care you need, when you need it.  We recommend signing up for the patient portal called "MyChart".  Sign up information is provided on this After Visit Summary.  MyChart is used to connect with patients for Virtual Visits (Telemedicine).  Patients are able to view lab/test results, encounter notes, upcoming appointments, etc.  Non-urgent messages can be sent to your provider as well.   To learn more about what you can do with MyChart, go to [email or web address].    Your next appointment:   1 month(s)  The format for your next appointment:   In Person  Provider:   You may see [name] Sable, MD or one of the following Advanced Practice Providers on your designated Care Team:   Murray Hodgkins, NP [name], PA-C Cadence Kathlen Mody, [place]    Other Instructions   Important Information About Sugar         Signed, [name] Sable, MD  10/24/2021 4:57 PM    [place]

## 2021-10-27 ENCOUNTER — Telehealth: Payer: Self-pay | Admitting: Cardiology

## 2021-10-27 NOTE — Telephone Encounter (Signed)
Scheduled

## 2021-10-27 NOTE — Telephone Encounter (Signed)
1 m fu per checkout 10-24-21 [facility]

## 2021-10-27 NOTE — Telephone Encounter (Signed)
Per patient recall for 12 m not 1 m .

## 2021-10-30 ENCOUNTER — Telehealth: Payer: Self-pay | Admitting: Cardiology

## 2021-10-30 NOTE — Telephone Encounter (Signed)
Pt c/o medication issue:  1. Name of Medication: losartan (COZAAR) 50 MG tablet   2. How are you currently taking this medication (dosage and times per day)? Once daily  3. Are you having a reaction (difficulty breathing--STAT)? Yes  4. What is your medication issue? Pt feels she has a lump in her throat.  Her EN&T provider advised it could be due to this medication. Please advise.

## 2021-10-30 NOTE — Telephone Encounter (Signed)
Would confirm she's not having signs of angioedema that could be from losartan (swelling of the throat, face, or tongue, trouble breathing, etc) in which case she should definitely stop losartan. If would not cause a lump inside the throat though.

## 2021-10-31 ENCOUNTER — Encounter: Payer: Self-pay | Admitting: Cardiology

## 2021-10-31 MED ORDER — LISINOPRIL 20 MG PO TABS: 20.0000 mg | ORAL_TABLET | Freq: Every day | ORAL | 3 refills | Status: DC

## 2021-10-31 NOTE — Telephone Encounter (Signed)
Spoke with Dr. [name] and he recommended that the patient stop taking the Losartan and start taking Lisinopril 20 MG once a day.  Called patient and informed her of the recommendation. Patient agreed with plan.

## 2021-10-31 NOTE — Telephone Encounter (Signed)
Spoke with patient and informed her of our pharmacists response below. Patient stated that her ENT, Dr. Tami Ribas wrote a note to our office and also informed her that this was definitely a side effect of the medication. I do not see any scanned in media. Patient stated that she will send a copy of the note through her MyChart.

## 2021-11-03 ENCOUNTER — Telehealth: Payer: Self-pay

## 2021-11-03 ENCOUNTER — Encounter: Payer: Self-pay | Admitting: Gastroenterology

## 2021-11-03 ENCOUNTER — Ambulatory Visit (INDEPENDENT_AMBULATORY_CARE_PROVIDER_SITE_OTHER): Payer: 59 | Admitting: Gastroenterology

## 2021-11-03 VITALS — BP 108/78 | HR 76 | Ht 66.0 in | Wt 214.0 lb

## 2021-11-03 DIAGNOSIS — Z1211 Encounter for screening for malignant neoplasm of colon: Secondary | ICD-10-CM

## 2021-11-03 DIAGNOSIS — K219 Gastro-esophageal reflux disease without esophagitis: Secondary | ICD-10-CM

## 2021-11-03 DIAGNOSIS — K5909 Other constipation: Secondary | ICD-10-CM | POA: Diagnosis not present

## 2021-11-03 DIAGNOSIS — R0989 Other specified symptoms and signs involving the circulatory and respiratory systems: Secondary | ICD-10-CM

## 2021-11-03 DIAGNOSIS — Z7901 Long term (current) use of anticoagulants: Secondary | ICD-10-CM | POA: Diagnosis not present

## 2021-11-03 MED ORDER — NA SULFATE-K SULFATE-MG SULF 17.5-3.13-1.6 GM/177ML PO SOLN: 1.0000 | ORAL | 0 refills | Status: DC

## 2021-11-03 NOTE — Progress Notes (Signed)
HPI :  56 year old female here for a follow-up visit for IBS-C/chronic constipation, GERD, globus.   Recall I last saw her in the office in October of last year.  She had some constipation when she was on Ozempic.  I placed her on some Linzess which helped her quite a bit although even despite low-dose it was rather strong for her, could lead to some leakage.  Her last colonoscopy was January 2013 without polyps.  No family history of colon cancer.  She has been on a variety of regimens for her diabetes/weight loss, was on Trulicity at one-point which she did not tolerate too well and then now most recently on Mounjaro.  She states she remains constipated, having roughly 3 bowel movements per week.  Has some abdominal cramping around bowel movements.  Can take Bentyl as needed which does help somewhat.  Moving her bowels helps her abdomen.  She does not have any blood in her stools but has some hemorrhoids from the constipation that can bother her at times.  She is otherwise had a longstanding history of reflux ongoing for years.  Previously on Zantac and then transition to PPI, previously on Protonix and now on omeprazole, currently she is on 40 mg twice daily.  The dose was increased when she had some persistent globus symptom that bothered her.  Her pyrosis is generally fairly well controlled.  We had discussed doing an EGD at our last visit but this was postponed as she had issues with atrial fibrillation.  She had a cardioversion and then ultimately cardiology decided to perform an ablation therapy which she had in March.  They would not allow holding anticoagulation for 3 months after her ablation so she has not been able to get her exams done in recent months.  In the interim she was seen by ENT and had a laryngoscopy.  They did not find anything concerning.  They had recommended holding her losartan as they thought that potentially could have been related.  She has not done that yet.  She states she  continues to have globus sensation in her throat/esophagus area.  Not much dysphagia at this time.  She had a barium study with me since have last seen her which was normal.  Otherwise she is on Eliquis for her atrial fibrillation.  She states the ablation went pretty well.  Generally her A-fib is in control.  She has rare short runs of it since the ablation but she states overall doing much better.   She also has had prior occurrence of diverticulitis 4 times in the past. She has been offered surgery but declined at this time. No FH of CRC.    Colonoscopy 05/2011 - diverticulosis, otherwise normal. No adenomas.     Echocardiogram 12/20/19 - EF 60-65%, otherwise normal   Nuclear stress test 08/30/20 - low risk study, normal EF   Barium swallow 06/17/21: IMPRESSION: Normal esophagram.  No explanation for patient's symptoms.    Past Medical History:  Diagnosis Date   Anal skin tag    Atrial fibrillation ([place])    Depression    Diverticulosis    Diverticulosis, sigmoid    Dyslipidemia    GERD (gastroesophageal reflux disease)    Hemorrhoids    internal and external   History of diverticulitis    10/ 2016   History of galactorrhea    History of stress test    a. Pt reports h/o stress test and echo by cardiologist in [place] (no records  in [place]).  Both reportedly nl.   Hypertension    IBS (irritable bowel syndrome)    Morbid obesity (HCC)    PONV (postoperative nausea and vomiting)    severe   Rash    elbows and behind knees   Restless leg syndrome    Sleep apnea    cpap sometimes   Wears glasses      Past Surgical History:  Procedure Laterality Date   ATRIAL FIBRILLATION ABLATION N/A 07/31/2021   Procedure: ATRIAL FIBRILLATION ABLATION;  Surgeon: Vickie Epley, MD;  Location: [facility];  Service: Cardiovascular;  Laterality: N/A;   BAND HEMORRHOIDECTOMY  2012   Dr Earlean Shawl   CARDIOVERSION N/A 04/08/2021   Procedure: CARDIOVERSION;  Surgeon: Nelva Bush, MD;   Location: [facility];  Service: Cardiovascular;  Laterality: N/A;   [place]   COLONOSCOPY  06/18/2011   CYSTO/  BILATERAL PYELOGRAM RETROGRADES/  TRANSOBTURATOR SLING  03/08/2006   EXCISION OF SKIN TAG N/A 02/13/2016   Procedure: ANAL SKIN TAG EXCISION;  Surgeon: [name], MD;  Location: [facility];  Service: General;  Laterality: N/A;   LAPAROSCOPIC CHOLECYSTECTOMY  05/22/2001   SHOULDER SURGERY     Family History  Problem Relation Age of Onset   Diabetes Mother    Hypertension Mother    Atrial fibrillation Mother    Colon cancer Mother    Diabetes Father        committed suicide   Skin cancer Father    Suicidality Father    Hypertension Father    Diabetes Sister    Diabetes Sister    Breast cancer Other        materanl great aunt   Sleep apnea Neg Hx    Social History   Tobacco Use   Smoking status: Never   Smokeless tobacco: Never   Tobacco comments:    Never smoke 08/26/21  Vaping Use   Vaping Use: Never used  Substance Use Topics   Alcohol use: No    Alcohol/week: 0.0 standard drinks of alcohol   Drug use: No   Current Outpatient Medications  Medication Sig Dispense Refill   acetaminophen (TYLENOL) 500 MG tablet Take 1,000 mg by mouth every 6 (six) hours as needed for mild pain.     albuterol (VENTOLIN HFA) 108 (90 Base) MCG/ACT inhaler Inhale 2 puffs into the lungs every 6 (six) hours as needed for wheezing or shortness of breath.     apixaban (ELIQUIS) 5 MG TABS tablet TAKE 1 TABLET BY MOUTH TWICE DAILY 60 tablet 5   Ascorbic Acid (VITAMIN C) 1000 MG tablet      buPROPion (WELLBUTRIN XL) 300 MG 24 hr tablet Take 300 mg by mouth every morning.      butalbital-acetaminophen-caffeine (FIORICET) 50-325-40 MG tablet Take 1 tablet by mouth 2 (two) times daily as needed for headache.     carvedilol (COREG) 25 MG tablet Take 2 tablets (50 mg total) by mouth 2 (two) times daily. 90 tablet 3   dicyclomine (BENTYL) 10 MG capsule Take one  tablet by mouth once to twice daily as needed 90 capsule 3   diphenhydrAMINE (BENADRYL) 25 MG tablet Take 25 mg by mouth at bedtime.     escitalopram (LEXAPRO) 20 MG tablet Take 20 mg by mouth every morning.      estradiol (ESTRACE) 2 MG tablet Take 2 mg by mouth daily.     fenofibrate micronized (LOFIBRA) 200 MG capsule Take 200 mg by  mouth daily before breakfast.   4   fexofenadine (ALLEGRA) 180 MG tablet      flecainide (TAMBOCOR) 50 MG tablet TAKE 1 TABLET BY MOUTH TWICE DAILY 180 tablet 2   fluticasone (FLONASE) 50 MCG/ACT nasal spray Place 1 spray into both nostrils daily.     ibuprofen (ADVIL) 200 MG tablet Take 600-800 mg by mouth every 6 (six) hours as needed for moderate pain.     linaclotide (LINZESS) 72 MCG capsule Take 72 mcg by mouth daily as needed (constipation).     lisinopril (ZESTRIL) 20 MG tablet Take 1 tablet (20 mg total) by mouth daily. 30 tablet 3   magnesium oxide (MAG-OX) 400 (240 Mg) MG tablet Take 400 mg by mouth daily.     meclizine (ANTIVERT) 25 MG tablet Take 1 tablet (25 mg total) by mouth 3 (three) times daily as needed for dizziness. 30 tablet 0   mometasone (ELOCON) 0.1 % ointment Apply 1 application. topically 2 (two) times daily as needed (rash).     MOUNJARO 10 MG/0.5ML Pen Inject into the skin.     omega-3 acid ethyl esters (LOVAZA) 1 g capsule Take 1 capsule (1 g total) by mouth 2 (two) times daily. 60 capsule 5   ondansetron (ZOFRAN-ODT) 8 MG disintegrating tablet Take 8 mg by mouth every 8 (eight) hours as needed.     progesterone (PROMETRIUM) 200 MG capsule Take 200 mg by mouth at bedtime.     tacrolimus (PROTOPIC) 0.1 % ointment Apply 1 application. topically 2 (two) times daily as needed (rash).     triamcinolone (KENALOG) 0.1 % Apply 1 application. topically 2 (two) times daily as needed (rash).     triamterene-hydrochlorothiazide (MAXZIDE-25) 37.5-25 MG per tablet Take 0.5 tablets by mouth every morning.      valACYclovir (VALTREX) 1000 MG tablet  Take 1,000 mg by mouth 2 (two) times daily as needed (cold sores).     zolpidem (AMBIEN) 10 MG tablet Take 10 mg by mouth at bedtime as needed for sleep.     omeprazole (PRILOSEC) 40 MG capsule Take 1 capsule (40 mg total) by mouth in the morning and at bedtime. 60 capsule 1   No current facility-administered medications for this visit.   Allergies  Allergen Reactions   Metformin Hcl     Other reaction(s): bloating Other reaction(s): bloating    Venlafaxine     Sleepy     Review of Systems: All systems reviewed and negative except where noted in HPI.   Lab Results  Component Value Date   WBC 10.2 07/11/2021   HGB 11.1 07/11/2021   HCT 31.7 (L) 07/11/2021   MCV 87 07/11/2021   PLT 573 (H) 07/11/2021    Lab Results  Component Value Date   CREATININE 0.85 07/11/2021   BUN 16 07/11/2021   NA 141 07/11/2021   K 4.8 07/11/2021   CL 102 07/11/2021   CO2 23 07/11/2021    Lab Results  Component Value Date   ALT 14 04/05/2021   AST 22 04/05/2021   ALKPHOS 32 (L) 04/05/2021   BILITOT 0.7 04/05/2021     Physical Exam: BP 108/78   Pulse 76   Ht '5\' 6"'$  (1.676 m)   Wt 214 lb (97.1 kg)   SpO2 98%   BMI 34.54 kg/m  Constitutional: Pleasant,well-developed, female in no acute distress. Neurological: Alert and oriented to person place and time. Psychiatric: Normal mood and affect. Behavior is normal.   ASSESSMENT AND PLAN: 56 year old female here  for reassessment of the following:  GERD Globus Chronic constipation Colon cancer screening Anticoagulation  As above, chronic GERD symptoms, she meets criteria for Barrett's esophagus screening and have recommended an EGD.  This will also evaluate her ongoing symptoms of globus.  Higher dose of PPI has not made a huge difference in that yet, so may not be related to reflux.  She has not no dysphagia at this time.  She also is due for screening colonoscopy.  We discussed doing both of these at the same time, discussed risk  benefits of the procedures and anesthesia and she wants to proceed.  She has recovered from her A-fib ablation therapy, doing well in regards to her atrial fibrillation.  Now that she is more than 3 months after the ablation she should be allowed to hold her anticoagulation.  We will ask for approval to hold her Eliquis for 2 days prior to this exam in case we need to remove polyps etc.  In the interim she will continue her twice daily omeprazole.  We may be able to reduce her dose pending the findings.  She understands long-term risks of PPI use.  ENT has recommended holding losartan to see if that will improve her globus, we will await her EGD and she will make a determination if she wants to do that or not.  Otherwise, her constipation remains poorly controlled.  Linzess was too strong for her even despite low-dose.  She does not really like taking MiraLAX routinely but is willing to do so.  I had free sample of Trulance in the office today, we will give this to her, if she likes it we will give her a prescription.  If not or it is too strong, could try low-dose Amitiza.  She agrees.  Plan: - EGD and colonoscopy in the [place] - will ask for approval to hold Eliquis for 2 days prior to the exam - continue omeprazole BID dosing for now - trial of Trulance one cap daily for constipation, can also use Miralax PRN  Jolly Mango, MD [facility]

## 2021-11-03 NOTE — Patient Instructions (Signed)
You have been given samples of Trulance 3 mg - Take 1 tablet by mouth once daily.   We have sent the following medications to your pharmacy for you to pick up at your convenience: [place] will be contaced by our office prior to your procedure for directions on holding your Eliquis  If you do not hear from our office 1 week prior to your scheduled procedure, please call [phone] to discuss.  You have been scheduled for an endoscopy and colonoscopy. Please follow the written instructions given to you at your visit today. Please pick up your prep supplies at the pharmacy within the next 1-3 days. If you use inhalers (even only as needed), please bring them with you on the day of your procedure.  If you are age 56 or older, your body mass index should be between 23-30. Your Body mass index is 34.54 kg/m. If this is out of the aforementioned range listed, please consider follow up with your Primary Care Provider.  If you are age 19 or younger, your body mass index should be between 19-25. Your Body mass index is 34.54 kg/m. If this is out of the aformentioned range listed, please consider follow up with your Primary Care Provider.   ________________________________________________________  The [facility] providers would like to encourage you to use [facility] to communicate with providers for non-urgent requests or questions.  Due to long hold times on the telephone, sending your provider a message by [facility] may be a faster and more efficient way to get a response.  Please allow 48 business hours for a response.  Please remember that this is for non-urgent requests.  _______________________________________________________  Thank you for choosing me and [facility].  Dr.Armbruster

## 2021-11-03 NOTE — Telephone Encounter (Signed)
Request for surgical clearance:     Endoscopy Procedure  What type of surgery is being performed?     Colon/EGD   When is this surgery scheduled?     11/13/21  What type of clearance is required ?   Pharmacy  Are there any medications that need to be held prior to surgery and how long? Eliquis x2 days prior to procedure   Practice name and name of physician performing surgery?      [facility]-Dr Armbruster   What is your office phone and fax number?      Phone- [phone]  [phone]  Anesthesia type (None, local, MAC, general) ?       MAC

## 2021-11-04 NOTE — Telephone Encounter (Signed)
   Primary Cardiologist: Kate Sable, MD  Chart reviewed as part of pre-operative protocol coverage. Given past medical history and time since last visit, based on ACC/AHA guidelines, [name] Ross would be at acceptable risk for the planned procedure without further cardiovascular testing.   Guidelines for holding Eliquis are as follows:  CHA2DS2-VASc Score = 2  This indicates a 2.2% annual risk of stroke. The patient's score is based upon: CHF History: 0 HTN History: 1 Diabetes History: 0 Stroke History: 0 Vascular Disease History: 0 Age Score: 0 Gender Score: 1   CrCl 112 ml/min Platelet count 573K   Pt is status post ablation 07/31/21. Scheduled procedure is outside of 90 day high risk window.  Requested 2 days   Per office protocol, patient can hold Eliquis for 2 days prior to procedure as requested.   I will route this recommendation to the requesting party via Epic fax function and remove from pre-op pool.  Please call with questions.  Emmaline Life, NP-C    11/04/2021, 5:08 PM [place] [address], [address] Office [phone] Fax [phone]

## 2021-11-04 NOTE — Telephone Encounter (Signed)
Patient with diagnosis of Afib on Eliquis for anticoagulation.    Procedure: Colonoscopy/EGD Date of procedure: 11/13/21   CHA2DS2-VASc Score = 2  This indicates a 2.2% annual risk of stroke. The patient's score is based upon: CHF History: 0 HTN History: 1 Diabetes History: 0 Stroke History: 0 Vascular Disease History: 0 Age Score: 0 Gender Score: 1    CrCl 112 ml/min Platelet count 573K   Pt is status post ablation 07/31/21. Scheduled procedure is outside of 90 day high risk window.  Requested 2 days  Per office protocol, patient can hold Eliquis for 2 days prior to procedure as requested.

## 2021-11-05 NOTE — Telephone Encounter (Signed)
Pt has been informed, okay to hold Eliquis x2 days prior to procedure. Pt voiced understanding.

## 2021-11-07 ENCOUNTER — Encounter: Payer: Self-pay | Admitting: Gastroenterology

## 2021-11-11 ENCOUNTER — Telehealth: Payer: Self-pay | Admitting: Gastroenterology

## 2021-11-11 NOTE — Telephone Encounter (Signed)
Returned call to patient. Informed her to not have more salad and made sure that she understood when to start clear liquids, and when to hold her Eliquis. Pt verbalized understanding and will keep her procedure scheduled for 11/13/21 with Dr.Armbruster.

## 2021-11-11 NOTE — Telephone Encounter (Signed)
Inbound call from patient stating that she forgot that she was not supposed to have salads 5 days before procedure. She stated that she had a salad around 3 today and it did have tomato's as well. Patient is scheduled to have Endo and Colon on 6/22 at 3:30. Patient is requesting a call back to discuss if she is okay to proceed with procedure. Please advise.

## 2021-11-12 DIAGNOSIS — I10 Essential (primary) hypertension: Secondary | ICD-10-CM | POA: Diagnosis not present

## 2021-11-12 DIAGNOSIS — R7301 Impaired fasting glucose: Secondary | ICD-10-CM | POA: Diagnosis not present

## 2021-11-12 DIAGNOSIS — E668 Other obesity: Secondary | ICD-10-CM | POA: Diagnosis not present

## 2021-11-12 DIAGNOSIS — E785 Hyperlipidemia, unspecified: Secondary | ICD-10-CM | POA: Diagnosis not present

## 2021-11-13 ENCOUNTER — Encounter: Payer: Self-pay | Admitting: Gastroenterology

## 2021-11-13 ENCOUNTER — Ambulatory Visit (AMBULATORY_SURGERY_CENTER): Payer: 59 | Admitting: Gastroenterology

## 2021-11-13 VITALS — BP 140/88 | HR 73 | Temp 98.2°F | Resp 12 | Ht 66.0 in | Wt 214.0 lb

## 2021-11-13 DIAGNOSIS — K219 Gastro-esophageal reflux disease without esophagitis: Secondary | ICD-10-CM

## 2021-11-13 DIAGNOSIS — G4733 Obstructive sleep apnea (adult) (pediatric): Secondary | ICD-10-CM | POA: Diagnosis not present

## 2021-11-13 DIAGNOSIS — Z1211 Encounter for screening for malignant neoplasm of colon: Secondary | ICD-10-CM | POA: Diagnosis not present

## 2021-11-13 DIAGNOSIS — D122 Benign neoplasm of ascending colon: Secondary | ICD-10-CM

## 2021-11-13 DIAGNOSIS — D123 Benign neoplasm of transverse colon: Secondary | ICD-10-CM

## 2021-11-13 DIAGNOSIS — K317 Polyp of stomach and duodenum: Secondary | ICD-10-CM

## 2021-11-13 DIAGNOSIS — R0989 Other specified symptoms and signs involving the circulatory and respiratory systems: Secondary | ICD-10-CM

## 2021-11-13 MED ORDER — SODIUM CHLORIDE 0.9 % IV SOLN: 500.0000 mL | Freq: Once | INTRAVENOUS | Status: DC

## 2021-11-13 NOTE — Op Note (Addendum)
[facility] Patient Name: [name] Ross Procedure Date: 11/13/2021 3:23 PM MRN: [id number] Endoscopist: Remo Lipps P. Havery Moros , MD Age: 56 Referring MD:  Date of Birth: Aug 15, 1965 Gender: Female Account #: [id number] Procedure:                Colonoscopy Indications:              Screening for colorectal malignant neoplasm Medicines:                Monitored Anesthesia Care Procedure:                Pre-Anesthesia Assessment:                           - Prior to the procedure, a History and Physical                            was performed, and patient medications and                            allergies were reviewed. The patient's tolerance of                            previous anesthesia was also reviewed. The risks                            and benefits of the procedure and the sedation                            options and risks were discussed with the patient.                            All questions were answered, and informed consent                            was obtained. Prior Anticoagulants: The patient has                            taken Eliquis (apixaban), last dose was 2 days                            prior to procedure. ASA Grade Assessment: III - A                            patient with severe systemic disease. After                            reviewing the risks and benefits, the patient was                            deemed in satisfactory condition to undergo the                            procedure.  After obtaining informed consent, the colonoscope                            was passed under direct vision. Throughout the                            procedure, the patient's blood pressure, pulse, and                            oxygen saturations were monitored continuously. The                            Olympus PCF-H190DL (#[id number]) Colonoscope was                            introduced through the anus and advanced to the the                             cecum, identified by appendiceal orifice and                            ileocecal valve. The colonoscopy was performed                            without difficulty. The patient tolerated the                            procedure well. The quality of the bowel                            preparation was good. The ileocecal valve,                            appendiceal orifice, and rectum were photographed. Scope In: 3:47:37 PM Scope Out: 4:11:56 PM Scope Withdrawal Time: 0 hours 18 minutes 15 seconds  Total Procedure Duration: 0 hours 24 minutes 19 seconds  Findings:                 The perianal and digital rectal examinations were                            normal.                           Three sessile polyps were found in the ascending                            colon. The polyps were 3 to 5 mm in size. These                            polyps were removed with a cold snare. Resection                            and retrieval were complete.  A 4 mm polyp was found in the transverse colon. The                            polyp was sessile. The polyp was removed with a                            cold snare. Resection and retrieval were complete.                           Multiple small-mouthed diverticula were found in                            the ascending colon and left colon, severe in left                            colon with luminal narrowing.                           Internal hemorrhoids were found during retroflexion.                           The exam was otherwise without abnormality. Complications:            No immediate complications. Estimated blood loss:                            Minimal. Estimated Blood Loss:     Estimated blood loss was minimal. Impression:               - Three 3 to 5 mm polyps in the ascending colon,                            removed with a cold snare. Resected and retrieved.                            - One 4 mm polyp in the transverse colon, removed                            with a cold snare. Resected and retrieved.                           - Diverticulosis in the ascending colon and in the                            left colon.                           - Internal hemorrhoids.                           - The examination was otherwise normal. Recommendation:           - Patient has a contact number available for  emergencies. The signs and symptoms of potential                            delayed complications were discussed with the                            patient. Return to normal activities tomorrow.                            Written discharge instructions were provided to the                            patient.                           - Resume previous diet.                           - Continue present medications.                           - Resume Eliquis tomorrow                           - Await pathology results. Remo Lipps P. Havery Moros, MD 11/13/2021 4:20:44 PM This report has been signed electronically.

## 2021-11-13 NOTE — Patient Instructions (Addendum)
YOU HAD AN ENDOSCOPIC PROCEDURE TODAY AT [facility]:   Refer to the procedure report that was given to you for any specific questions about what was found during the examination.  If the procedure report does not answer your questions, please call your gastroenterologist to clarify.  If you requested that your care partner not be given the details of your procedure findings, then the procedure report has been included in a sealed envelope for you to review at your convenience later.  YOU SHOULD EXPECT: Some feelings of bloating in the abdomen. Passage of more gas than usual.  Walking can help get rid of the air that was put into your GI tract during the procedure and reduce the bloating. If you had a lower endoscopy (such as a colonoscopy or flexible sigmoidoscopy) you may notice spotting of blood in your stool or on the toilet paper. If you underwent a bowel prep for your procedure, you may not have a normal bowel movement for a few days.  Please Note:  You might notice some irritation and congestion in your nose or some drainage.  This is from the oxygen used during your procedure.  There is no need for concern and it should clear up in a day or so.  SYMPTOMS TO REPORT IMMEDIATELY:  Following lower endoscopy (colonoscopy or flexible sigmoidoscopy):  Excessive amounts of blood in the stool  Significant tenderness or worsening of abdominal pains  Swelling of the abdomen that is new, acute  Fever of 100F or higher  Following upper endoscopy (EGD)  Vomiting of blood or coffee ground material  New chest pain or pain under the shoulder blades  Painful or persistently difficult swallowing  New shortness of breath  Fever of 100F or higher  Black, tarry-looking stools  For urgent or emergent issues, a gastroenterologist can be reached at any hour by calling [phone]. Do not use MyChart messaging for urgent concerns.    DIET:  We do recommend a small meal at first, but  then you may proceed to your regular diet.  Drink plenty of fluids but you should avoid alcoholic beverages for 24 hours.  MEDICATIONS: Continue present medications. Resume Eliquis at prior dose tomorrow.  Please see handouts given to you by your recovery nurse.  Thank you for allowing [place] to provide for your healthcare needs today.  ACTIVITY:  You should plan to take it easy for the rest of today and you should NOT DRIVE or use heavy machinery until tomorrow (because of the sedation medicines used during the test).    FOLLOW UP: Our staff will call the number listed on your records 24-72 hours following your procedure to check on you and address any questions or concerns that you may have regarding the information given to you following your procedure. If we do not reach you, we will leave a message.  We will attempt to reach you two times.  During this call, we will ask if you have developed any symptoms of COVID 19. If you develop any symptoms (ie: fever, flu-like symptoms, shortness of breath, cough etc.) before then, please call [phone].  If you test positive for Covid 19 in the 2 weeks post procedure, please call and report this information to [place].    If any biopsies were taken you will be contacted by phone or by letter within the next 1-3 weeks.  Please call us at [phone] if you have not heard about the biopsies in 3 weeks.  SIGNATURES/CONFIDENTIALITY: You and/or your care partner have signed paperwork which will be entered into your electronic medical record.  These signatures attest to the fact that that the information above on your After Visit Summary has been reviewed and is understood.  Full responsibility of the confidentiality of this discharge information lies with you and/or your care-partner.

## 2021-11-13 NOTE — Op Note (Addendum)
[facility] Patient Name: [name] Ross Procedure Date: 11/13/2021 3:24 PM MRN: [id number] Endoscopist: Remo Lipps P. Havery Moros , MD Age: 56 Referring MD:  Date of Birth: Jun 04, 1965 Gender: Female Account #: [id number] Procedure:                Upper GI endoscopy Indications:              history of gastro-esophageal reflux disease -                            screen for Barrett's, Globus sensation - negative                            barium study, negative laryngoscopy Medicines:                Monitored Anesthesia Care Procedure:                Pre-Anesthesia Assessment:                           - Prior to the procedure, a History and Physical                            was performed, and patient medications and                            allergies were reviewed. The patient's tolerance of                            previous anesthesia was also reviewed. The risks                            and benefits of the procedure and the sedation                            options and risks were discussed with the patient.                            All questions were answered, and informed consent                            was obtained. Prior Anticoagulants: The patient has                            taken Eliquis (apixaban), last dose was 2 days                            prior to procedure. ASA Grade Assessment: III - A                            patient with severe systemic disease. After                            reviewing the risks and benefits, the patient was  deemed in satisfactory condition to undergo the                            procedure.                           After obtaining informed consent, the endoscope was                            passed under direct vision. Throughout the                            procedure, the patient's blood pressure, pulse, and                            oxygen saturations were monitored continuously. The                             GIF [id number] was introduced through the                            mouth, and advanced to the second part of duodenum.                            The upper GI endoscopy was accomplished without                            difficulty. The patient tolerated the procedure                            well. Scope In: Scope Out: Findings:                 Esophagogastric landmarks were identified: the                            Z-line was found at 40 cm, the gastroesophageal                            junction was found at 40 cm and the upper extent of                            the gastric folds was found at 40 cm from the                            incisors.                           The exam of the esophagus was otherwise normal. No                            erosive changes                           A single polypoid lesion vs. benign hyperplastic  change / foldd was found in the gastric antrum.                            Biopsies were taken with a cold forceps for                            histology.                           The exam of the stomach was otherwise normal.                           The examined duodenum was normal. Complications:            No immediate complications. Estimated blood loss:                            Minimal. Estimated Blood Loss:     Estimated blood loss was minimal. Impression:               - Esophagogastric landmarks identified.                           - Normal esophagus otherwise                           - A single small gastric polypoid lesion. Biopsied.                           - Normal stomach otherwise                           - Normal examined duodenum.                           No erosive changes or concerning pathology in the                            esophagus. If GERD is causing globus, would be due                            to nonerosive reflux disease. Would consider other                             etiologies if typical reflux symptoms controlled on                            PPI Recommendation:           - Patient has a contact number available for                            emergencies. The signs and symptoms of potential                            delayed complications were discussed with the  patient. Return to normal activities tomorrow.                            Written discharge instructions were provided to the                            patient.                           - Resume previous diet.                           - Continue present medications.                           - Resume Eliquis tomorrow per colonoscopy note                           - Await pathology results. Remo Lipps P. Havery Moros, MD 11/13/2021 4:28:38 PM This report has been signed electronically.

## 2021-11-13 NOTE — Progress Notes (Signed)
Called to room to assist during endoscopic procedure.  Patient ID and intended procedure confirmed with present staff. Received instructions for my participation in the procedure from the performing physician.  

## 2021-11-14 ENCOUNTER — Telehealth: Payer: Self-pay | Admitting: Gastroenterology

## 2021-11-14 ENCOUNTER — Telehealth: Payer: Self-pay | Admitting: *Deleted

## 2021-11-14 NOTE — Telephone Encounter (Signed)
Follow up call attempt.  LVM to call with any questions or concerns. 

## 2021-11-16 NOTE — Telephone Encounter (Signed)
Thanks. Can you please prescribe her low Amitiza BID 1 month supply as a trial? Thanks

## 2021-11-17 NOTE — Telephone Encounter (Signed)
Lm on vm for patient to return call 

## 2021-11-18 MED ORDER — LUBIPROSTONE 8 MCG PO CAPS: 8.0000 ug | ORAL_CAPSULE | Freq: Two times a day (BID) | ORAL | 0 refills | Status: DC

## 2021-11-18 NOTE — Telephone Encounter (Signed)
Called and spoke with patient. I informed her that we have the order for the Amitiza. Pt would like RX sent to local pharmacy on file. Pt knows that it may require a PA, but the pharmacy will contact us and we will work on that for her. Pt knows to contact the pharmacy for a refill if the Amitiza works well for her. Pt verbalized understanding and had no concerns at the end of the call.

## 2021-11-21 ENCOUNTER — Other Ambulatory Visit: Payer: Self-pay | Admitting: Physician Assistant

## 2021-11-21 DIAGNOSIS — I480 Paroxysmal atrial fibrillation: Secondary | ICD-10-CM

## 2021-11-21 DIAGNOSIS — I10 Essential (primary) hypertension: Secondary | ICD-10-CM

## 2021-12-02 ENCOUNTER — Telehealth: Payer: Self-pay

## 2021-12-02 NOTE — Telephone Encounter (Signed)
Received notification from [place] that prior authorization for LUBIPROSTONE 59mg has been APPROVED  Key: [id number] PA Case ID: [id number] Rx #: [id number]

## 2021-12-13 DIAGNOSIS — G4733 Obstructive sleep apnea (adult) (pediatric): Secondary | ICD-10-CM | POA: Diagnosis not present

## 2021-12-18 ENCOUNTER — Other Ambulatory Visit: Payer: Self-pay | Admitting: Physician Assistant

## 2021-12-19 NOTE — Telephone Encounter (Signed)
Please review for refill. Refer to Dr. Ileene Hutchinson note sent by patient through my chart on 10/31/21. Thank you!

## 2021-12-19 NOTE — Telephone Encounter (Signed)
Patient states she did not want to take lisinopril so she continued taking losartan.

## 2021-12-19 NOTE — Telephone Encounter (Signed)
Do you mind calling the patient and asking her if she is still taking losartan?   Dr. [name] had wanted to switch her to lisiniopril and then she didn't want to do this per a June 9 MyChart message:  Kavin Leech, RN to NATHALEE SMARR       11/03/21  9:07 AM Good Morning Tommie Sams,   Dr. [name] would like you to come in for an appointment. Please call our scheduling office at [phone] to schedule this.   Sincerely,   Coleen RN    Last read by [name] at 11:36 AM on 11/03/2021. October 31, 2021   Bo Merino Reek to [facility] Triage (supporting Kate Sable, MD)       10/31/21  6:24 PM Could you ask the Dr to switch me to something other than lisinopril? It has some possible severe side effects. I thought it lisinopril when the nurse called me but I wanted to confirm before I said anything. \\     The patient's next office visit is not until 04/23/22 with [name], PA.  If she is still taking losartan and feeling ok, then ok to refill. If she is still having some potential side effects and taking losartan, she will need to come in ASAP for an appointment.

## 2022-01-06 ENCOUNTER — Other Ambulatory Visit: Payer: Self-pay

## 2022-01-06 MED ORDER — LUBIPROSTONE 8 MCG PO CAPS: 8.0000 ug | ORAL_CAPSULE | Freq: Two times a day (BID) | ORAL | 0 refills | Status: DC

## 2022-01-06 NOTE — Telephone Encounter (Signed)
Generic amitiza refilled.

## 2022-01-08 ENCOUNTER — Encounter: Payer: Self-pay | Admitting: Gastroenterology

## 2022-01-09 ENCOUNTER — Other Ambulatory Visit: Payer: Self-pay

## 2022-01-09 MED ORDER — LUBIPROSTONE 24 MCG PO CAPS: 24.0000 ug | ORAL_CAPSULE | Freq: Two times a day (BID) | ORAL | 0 refills | Status: DC

## 2022-01-13 DIAGNOSIS — G4733 Obstructive sleep apnea (adult) (pediatric): Secondary | ICD-10-CM | POA: Diagnosis not present

## 2022-02-03 ENCOUNTER — Other Ambulatory Visit: Payer: Self-pay | Admitting: Gastroenterology

## 2022-02-03 MED ORDER — LUBIPROSTONE 24 MCG PO CAPS
24.0000 ug | ORAL_CAPSULE | Freq: Two times a day (BID) | ORAL | 1 refills | Status: DC
Start: 2022-02-03 — End: 2022-04-02

## 2022-02-13 DIAGNOSIS — G4733 Obstructive sleep apnea (adult) (pediatric): Secondary | ICD-10-CM | POA: Diagnosis not present

## 2022-02-19 DIAGNOSIS — R7301 Impaired fasting glucose: Secondary | ICD-10-CM | POA: Diagnosis not present

## 2022-02-19 DIAGNOSIS — E668 Other obesity: Secondary | ICD-10-CM | POA: Diagnosis not present

## 2022-02-19 DIAGNOSIS — Z23 Encounter for immunization: Secondary | ICD-10-CM | POA: Diagnosis not present

## 2022-02-19 DIAGNOSIS — I10 Essential (primary) hypertension: Secondary | ICD-10-CM | POA: Diagnosis not present

## 2022-02-19 DIAGNOSIS — E785 Hyperlipidemia, unspecified: Secondary | ICD-10-CM | POA: Diagnosis not present

## 2022-02-20 DIAGNOSIS — G4733 Obstructive sleep apnea (adult) (pediatric): Secondary | ICD-10-CM | POA: Diagnosis not present

## 2022-03-15 DIAGNOSIS — G4733 Obstructive sleep apnea (adult) (pediatric): Secondary | ICD-10-CM | POA: Diagnosis not present

## 2022-03-18 ENCOUNTER — Other Ambulatory Visit: Payer: Self-pay | Admitting: *Deleted

## 2022-03-18 MED ORDER — FLECAINIDE ACETATE 50 MG PO TABS: 50.0000 mg | ORAL_TABLET | Freq: Two times a day (BID) | ORAL | 0 refills | Status: DC

## 2022-04-02 ENCOUNTER — Other Ambulatory Visit: Payer: Self-pay | Admitting: Gastroenterology

## 2022-04-07 IMAGING — DX DG CHEST 1V PORT
1 series · 1 of 1 positions shown · non-contrast
Comparison: 12/19/2019

CLINICAL DATA: Chest pain.

EXAM:
PORTABLE CHEST 1 VIEW

[chest ap]
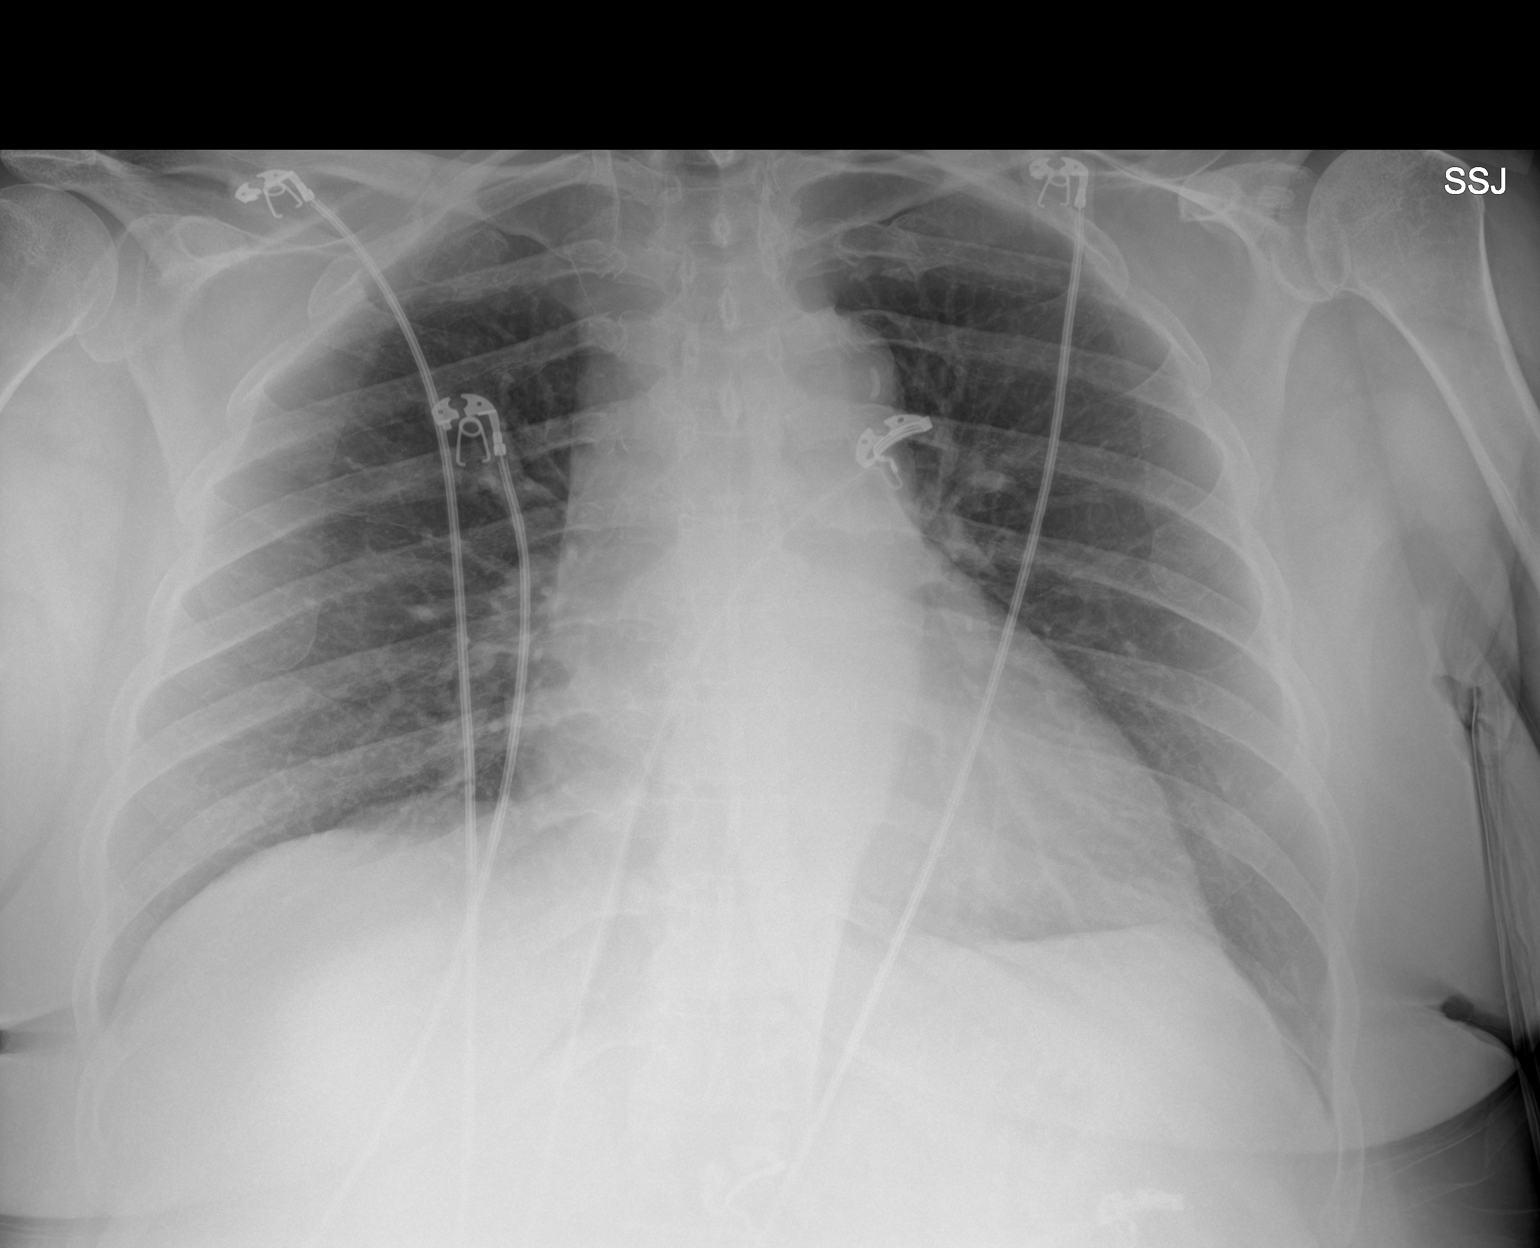

[1 of 1 positions shown; findings below may reference images not displayed]

FINDINGS: Upper normal heart size is likely accentuated by portable AP
technique.The cardiomediastinal contours are normal. Atherosclerosis
of the aortic arch. Pulmonary vasculature is normal. No
consolidation, pleural effusion, or pneumothorax. No acute osseous
abnormalities are seen.
IMPRESSION: No acute chest findings.

## 2022-04-13 ENCOUNTER — Other Ambulatory Visit: Payer: Self-pay

## 2022-04-13 DIAGNOSIS — I480 Paroxysmal atrial fibrillation: Secondary | ICD-10-CM

## 2022-04-13 DIAGNOSIS — I10 Essential (primary) hypertension: Secondary | ICD-10-CM

## 2022-04-13 MED ORDER — CARVEDILOL 25 MG PO TABS: ORAL_TABLET | ORAL | 0 refills | Status: DC

## 2022-04-20 ENCOUNTER — Other Ambulatory Visit (HOSPITAL_COMMUNITY): Payer: Self-pay

## 2022-04-20 MED ORDER — OZEMPIC (2 MG/DOSE) 8 MG/3ML ~~LOC~~ SOPN
2.0000 mg | PEN_INJECTOR | SUBCUTANEOUS | 11 refills | Status: DC
  Filled 2022-04-20: qty 3, 28d supply, fill #0
  Filled 2022-05-15: qty 3, 28d supply, fill #1
  Filled 2022-06-10: qty 3, 28d supply, fill #2
  Filled 2022-07-01 – 2022-07-12 (×2): qty 3, 28d supply, fill #3
  Filled 2022-11-11: qty 3, 28d supply, fill #4

## 2022-04-20 NOTE — Progress Notes (Unsigned)
Cardiology Office Note    Date:  04/23/2022   ID:  AIKA BRZOSKA, DOB Aug 20, 1965, MRN [id number]  PCP:  Donnajean Lopes, MD  Cardiologist:  Kate Sable, MD  Electrophysiologist:  Vickie Epley, MD   Chief Complaint: Follow-up  History of Present Illness:   [name] Ross is a 56 y.o. female with history of normal coronaries by coronary CTA in 07/2021, PAF status post ablation in 07/2021, SVT, HTN, HLD, obesity, OSA on CPAP, and GERD who presents for follow-up of A-fib.   She was admitted in 11/2019 with A. fib with RVR.  Echo in 11/2019 showed an EF of 60 to 65%, no regional wall motion abnormalities, normal LV diastolic function parameters, normal RV systolic function and ventricular cavity size, and trivial mitral regurgitation.  She was placed on anticoagulation along with carvedilol.  Subsequent office visit in 12/2019 documented sinus rhythm.  Due to recurrence of palpitations, outpatient cardiac monitoring in 07/2020 demonstrated a predominant rhythm of sinus with an average heart rate of 83 bpm (range 56 to 197 bpm), 148 episodes of SVT occurred with the longest lasting 13 beats, A. fib was noted with a less than 1% burden with the longest episode lasting 28 minutes and 17 seconds.  SVT and A. fib were detected with patient triggered events.  She was initiated on flecainide therapy in 06/2020 with continuation of carvedilol and diltiazem.  Treadmill MPI on 08/30/2020, demonstrated she was able to exercise for 5 minutes and was not able to achieve 85% MPHR, therefore was transitioned to [place].  Stress test was low risk overall.  CT attenuated corrected images showed no evidence of coronary artery calcifications or aortic atherosclerosis.  She has been followed by the A. fib clinic and EP.  She was admitted in 03/2021 for A. fib with RVR and subsequently cardioverted on 04/08/2021.  Echo during that admission demonstrated an EF of 60 to 65%, no regional wall motion abnormalities, mild  asymmetric left ventricular hypertrophy of the basal septal segment, indeterminate LV diastolic function parameters, normal RV systolic function and ventricular cavity size, and a trivial mitral regurgitation.  Coronary CTA in 07/2021 showed a calcium score of 0 and no evidence of coronary artery disease.  She underwent successful A-fib as well as CTI dependent flutter ablation on 07/31/2021.  After follow-up with EP in 09/2021, she did continue to have some intermittent palpitations, though these appeared to be consistent with PACs or short runs of atrial tach.  She has been evaluated by GI and ENT for globus sensation as well as reflux with reassuring laryngoscopy by ENT with recommendation for trial of holding losartan to see if this improved her globus sensation.  She has subsequently undergone EGD in 10/2021 which was largely unrevealing with recommendation to continue PPI for treatment of GERD and possible nonerosive globus.  She comes in doing well from a cardiac perspective and is without symptoms of angina or decompensation.  No palpitations or symptoms consistent with recurrence of A-fib.  No lower extremity swelling or progressive orthopnea.  She has had 1 mechanical fall since she was last seen and did not hit her head or suffer LOC.  No symptoms concerning for hematochezia or melena.  She remains adherent to cardiac medications.  She continues to have a globus sensation.   Labs independently reviewed: 06/2021 - Hgb 11.1, PLT 573, BUN 16, serum creatinine 0.85, potassium 4.8 03/2021 - magnesium 2.0, A1c 5.9, TSH normal, free T4 normal, albumin 3.5, AST/ALT normal  11/2019 - direct LDL 66, TC 204, TG 510, HDL 39  Past Medical History:  Diagnosis Date   Allergy    Anal skin tag    Anemia    Anxiety    Atrial fibrillation ([place])    Depression    Diabetes mellitus without complication ([place])    pre DM   Diverticulosis    Diverticulosis, sigmoid    Dyslipidemia    GERD (gastroesophageal reflux  disease)    Hemorrhoids    internal and external   History of diverticulitis    10/ 2016   History of galactorrhea    History of stress test    a. Pt reports h/o stress test and echo by cardiologist in [place] (no records in [place]).  Both reportedly nl.   Hyperlipidemia    Hypertension    IBS (irritable bowel syndrome)    Morbid obesity (HCC)    PONV (postoperative nausea and vomiting)    severe   Rash    elbows and behind knees   Restless leg syndrome    Sleep apnea    wears c-pap   Wears glasses     Past Surgical History:  Procedure Laterality Date   ATRIAL FIBRILLATION ABLATION N/A 07/31/2021   Procedure: ATRIAL FIBRILLATION ABLATION;  Surgeon: Vickie Epley, MD;  Location: [facility];  Service: Cardiovascular;  Laterality: N/A;   BAND HEMORRHOIDECTOMY  2012   Dr Earlean Shawl   CARDIOVERSION N/A 04/08/2021   Procedure: CARDIOVERSION;  Surgeon: Nelva Bush, MD;  Location: [facility];  Service: Cardiovascular;  Laterality: N/A;   [place]   COLONOSCOPY  06/18/2011   CYSTO/  BILATERAL PYELOGRAM RETROGRADES/  TRANSOBTURATOR SLING  03/08/2006   EXCISION OF SKIN TAG N/A 02/13/2016   Procedure: ANAL SKIN TAG EXCISION;  Surgeon: [name], MD;  Location: [facility];  Service: General;  Laterality: N/A;   LAPAROSCOPIC CHOLECYSTECTOMY  05/22/2001   SHOULDER SURGERY      Current Medications: Current Meds  Medication Sig   acetaminophen (TYLENOL) 500 MG tablet Take 1,000 mg by mouth every 6 (six) hours as needed for mild pain.   albuterol (VENTOLIN HFA) 108 (90 Base) MCG/ACT inhaler Inhale 2 puffs into the lungs every 6 (six) hours as needed for wheezing or shortness of breath.   amLODipine (NORVASC) 5 MG tablet Take 1 tablet (5 mg total) by mouth daily.   apixaban (ELIQUIS) 5 MG TABS tablet TAKE 1 TABLET BY MOUTH TWICE DAILY   Ascorbic Acid (VITAMIN C) 1000 MG tablet    buPROPion (WELLBUTRIN XL) 300 MG 24 hr tablet Take 300 mg by mouth every  morning.    butalbital-acetaminophen-caffeine (FIORICET) 50-325-40 MG tablet Take 1 tablet by mouth 2 (two) times daily as needed for headache.   carvedilol (COREG) 25 MG tablet TAKE (2) TABLETS BY MOUTH TWICE DAILY.   dicyclomine (BENTYL) 10 MG capsule Take one tablet by mouth once to twice daily as needed   diphenhydrAMINE (BENADRYL) 25 MG tablet Take 25 mg by mouth at bedtime.   escitalopram (LEXAPRO) 20 MG tablet Take 20 mg by mouth every morning.    estradiol (ESTRACE) 2 MG tablet Take 2 mg by mouth daily.   fenofibrate micronized (LOFIBRA) 200 MG capsule Take 200 mg by mouth daily before breakfast.    fexofenadine (ALLEGRA) 180 MG tablet    fluticasone (FLONASE) 50 MCG/ACT nasal spray Place 1 spray into both nostrils daily.   ibuprofen (ADVIL) 200 MG tablet Take 600-800 mg  by mouth every 6 (six) hours as needed for moderate pain.   linaclotide (LINZESS) 72 MCG capsule Take 72 mcg by mouth daily as needed (constipation).   lubiprostone (AMITIZA) 24 MCG capsule TAKE ONE CAPSULE BY MOUTH TWICE DAILY WITH FOOD   magnesium oxide (MAG-OX) 400 (240 Mg) MG tablet Take 400 mg by mouth daily.   meclizine (ANTIVERT) 25 MG tablet Take 1 tablet (25 mg total) by mouth 3 (three) times daily as needed for dizziness.   mometasone (ELOCON) 0.1 % ointment Apply 1 application. topically 2 (two) times daily as needed (rash).   omega-3 acid ethyl esters (LOVAZA) 1 g capsule Take 1 capsule (1 g total) by mouth 2 (two) times daily.   omeprazole (PRILOSEC) 40 MG capsule Take 1 capsule (40 mg total) by mouth in the morning and at bedtime.   ondansetron (ZOFRAN-ODT) 8 MG disintegrating tablet Take 8 mg by mouth every 8 (eight) hours as needed.   progesterone (PROMETRIUM) 200 MG capsule Take 200 mg by mouth at bedtime.   Semaglutide, 2 MG/DOSE, (OZEMPIC, 2 MG/DOSE,) 8 MG/3ML SOPN Inject 2 mg into the skin once a week.   tacrolimus (PROTOPIC) 0.1 % ointment Apply 1 application. topically 2 (two) times daily as needed  (rash).   triamcinolone (KENALOG) 0.1 % Apply 1 application. topically 2 (two) times daily as needed (rash).   triamterene-hydrochlorothiazide (MAXZIDE-25) 37.5-25 MG per tablet Take 0.5 tablets by mouth every morning.    valACYclovir (VALTREX) 1000 MG tablet Take 1,000 mg by mouth 2 (two) times daily as needed (cold sores).   zolpidem (AMBIEN) 10 MG tablet Take 10 mg by mouth at bedtime as needed for sleep.   [DISCONTINUED] flecainide (TAMBOCOR) 50 MG tablet Take 1 tablet (50 mg total) by mouth 2 (two) times daily.   [DISCONTINUED] losartan (COZAAR) 50 MG tablet TAKE 1 TABLET BY MOUTH ONCE DAILY.   Current Facility-Administered Medications for the 04/23/22 encounter (Office Visit) with Rise Mu, PA-C  Medication   0.9 %  sodium chloride infusion    Allergies:   Metformin hcl and Venlafaxine   Social History   Socioeconomic History   Marital status: Married    Spouse name: [name] Ross   Number of children: 2   Years of education: RN   Highest education level: Not on file  Occupational History   Occupation: RN  Tobacco Use   Smoking status: Never   Smokeless tobacco: Never   Tobacco comments:    Never smoke 08/26/21  Vaping Use   Vaping Use: Never used  Substance and Sexual Activity   Alcohol use: No    Alcohol/week: 0.0 standard drinks of alcohol   Drug use: No   Sexual activity: Not on file    Comment: post menopausal per pt  Other Topics Concern   Not on file  Social History Narrative   Lives in [place].  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  Occasionally drinks coffee but not a heavy caffeine drinker.     Social Determinants of Health   Financial Resource Strain: Not on file  Food Insecurity: Not on file  Transportation Needs: Not on file  Physical Activity: Not on file  Stress: Not on file  Social Connections: Not on file     Family History:  The patient's family history includes Atrial fibrillation in her mother; Breast cancer in an other  family member; Colon cancer in her mother; Diabetes in her father, mother, sister, and sister; Hypertension in her father and  mother; Skin cancer in her father; Suicidality in her father. There is no history of Sleep apnea, Esophageal cancer, Rectal cancer, or Stomach cancer.  ROS:   12-point review of systems is negative unless otherwise noted in the HPI.   EKGs/Labs/Other Studies Reviewed:    Studies reviewed were summarized above. The additional studies were reviewed today:  Coronary CTA 07/24/2021: Aorta:  Normal size.  No calcifications.  No dissection.   Aortic Valve:  Trileaflet.  No calcifications.   Coronary Arteries:  Normal coronary origin.  Right dominance.   RCA is a dominant artery that gives rise to PDA and PLA. There is no plaque.   Left main is a large artery that gives rise to LAD and LCX arteries. LM has no disease   LAD has no plaque.   LCX is a non-dominant artery that gives rise to one OM1 branch. There is no plaque.   Other findings:   Normal pulmonary vein drainage into the left atrium.   Normal left atrial appendage without a thrombus.   Normal size of the pulmonary artery.   IMPRESSION: 1. Normal coronary calcium score of 0. Patient is low risk for coronary events. 2. Normal coronary origin with right dominance. 3. No evidence of CAD. 4. CAD-RADS 0. Consider non-atherosclerotic causes of chest pain. __________  2D echo 04/07/2021: 1. Left ventricular ejection fraction, by estimation, is 60 to 65%. The  left ventricle has normal function. The left ventricle has no regional  wall motion abnormalities. There is mild asymmetric left ventricular  hypertrophy of the basal-septal segment.  Left ventricular diastolic parameters are indeterminate.   2. Right ventricular systolic function is normal. The right ventricular  size is normal. Tricuspid regurgitation signal is inadequate for assessing  PA pressure.   3. The mitral valve is normal in  structure. Trivial mitral valve  regurgitation. No evidence of mitral stenosis.   4. The aortic valve is normal in structure. Aortic valve regurgitation is  not visualized. No aortic stenosis is present. __________   Carlton Adam MPI 08/30/2020: Blood pressure demonstrated a normal response to exercise. The patient exercised for 5 minutes and was not able to achieve 85% maximum predicted heart rate and thus switched to [place]. There was no ST segment deviation noted during stress. The study is normal. This is a low risk study. The left ventricular ejection fraction is normal (55-65%). CT attenuation images showed no evidence of aortic or coronary calcifications. __________   Elwyn Reach patch 06/2020: Patient had a min HR of 53 bpm, max HR of 197 bpm, and avg HR of 83 bpm. Predominant underlying rhythm was Sinus Rhythm. 148 Supraventricular Tachycardia runs occurred, the run with the fastest interval lasting 4 beats with a max rate of 197 bpm, the  longest lasting 13 beats with an avg rate of 127 bpm. Atrial Fibrillation occurred (<1% burden), ranging from 74-166 bpm (avg of 112 bpm), the longest lasting 28 mins 17 secs with an avg rate of 113 bpm. Supraventricular Tachycardia and Atrial  Fibrillation were detected within +/- 45 seconds of symptomatic patient event(s). __________   2D echo 12/20/2019: 1. Left ventricular ejection fraction, by estimation, is 60 to 65%. The  left ventricle has normal function. The left ventricle has no regional  wall motion abnormalities. Left ventricular diastolic parameters were  normal.   2. Right ventricular systolic function is normal. The right ventricular  size is normal.   3. The mitral valve is normal in structure. Trivial mitral valve  regurgitation.  4. The aortic valve is normal in structure. Aortic valve regurgitation is not visualized.   EKG:  EKG is ordered today.  The EKG ordered today demonstrates NSR, 81 bpm, nonspecific ST-T changes  Recent  Labs: 07/11/2021: BUN 16; Creatinine, Ser 0.85; Hemoglobin 11.1; Platelets 573; Potassium 4.8; Sodium 141  Recent Lipid Panel    Component Value Date/Time   CHOL 204 (H) 12/20/2019 0502   TRIG 510 (H) 12/20/2019 0502   HDL 39 (L) 12/20/2019 0502   CHOLHDL 5.2 12/20/2019 0502   VLDL UNABLE TO CALCULATE IF TRIGLYCERIDE OVER 400 mg/dL 12/20/2019 0502   LDLCALC UNABLE TO CALCULATE IF TRIGLYCERIDE OVER 400 mg/dL 12/20/2019 0502   LDLDIRECT 66.8 12/20/2019 0502    PHYSICAL EXAM:    VS:  BP 120/88 (BP Location: Left Arm, Patient Position: Sitting, Cuff Size: Normal)   Pulse 81   Ht '5\' 6"'$  (1.676 m)   Wt 215 lb (97.5 kg)   LMP 02/13/2015   SpO2 98%   BMI 34.70 kg/m   BMI: Body mass index is 34.7 kg/m.  Physical Exam Vitals reviewed.  Constitutional:      Appearance: She is well-developed.  HENT:     Head: Normocephalic and atraumatic.  Eyes:     General:        Right eye: No discharge.        Left eye: No discharge.  Neck:     Vascular: No JVD.  Cardiovascular:     Rate and Rhythm: Normal rate and regular rhythm.     Pulses:          Posterior tibial pulses are 2+ on the right side and 2+ on the left side.     Heart sounds: Normal heart sounds, S1 normal and S2 normal. Heart sounds not distant. No midsystolic click and no opening snap. No murmur heard.    No friction rub.  Pulmonary:     Effort: Pulmonary effort is normal. No respiratory distress.     Breath sounds: Normal breath sounds. No decreased breath sounds, wheezing or rales.  Chest:     Chest wall: No tenderness.  Abdominal:     General: There is no distension.  Musculoskeletal:     Cervical back: Normal range of motion.  Skin:    General: Skin is warm and dry.     Nails: There is no clubbing.  Neurological:     Mental Status: She is alert and oriented to person, place, and time.  Psychiatric:        Speech: Speech normal.        Behavior: Behavior normal.        Thought Content: Thought content normal.         Judgment: Judgment normal.     Wt Readings from Last 3 Encounters:  04/23/22 215 lb (97.5 kg)  11/13/21 214 lb (97.1 kg)  11/03/21 214 lb (97.1 kg)     ASSESSMENT & PLAN:   Persistent A-fib: Maintaining sinus rhythm without symptoms concerning for recurrence.  She remains on carvedilol.  As noted at EP visit in 09/2021, she may discontinue flecainide at this time.  She is to remain on apixaban indefinitely given CHA2DS2-VASc of at least 2.  No symptoms concerning for bleeding.  Recent labs showed stable hemoglobin, renal function, and electrolytes.  Paroxysmal SVT: Quiescent.  Continue carvedilol as outlined above.  HTN: Blood pressure is well-controlled in the office today.  We will discontinue losartan as outlined below to see if this improves  her globus sensation.  To compensate for this, we will add amlodipine 5 mg daily.  HLD: LDL 66 in 11/2019.  Calcium score of 0 by coronary CTA in 07/2021.  OSA: Remains on CPAP.  Followed by [facility].  GERD/globus sensation: She remains on omeprazole per GI.  I do wonder if some of her globus sensation is potentially some mild angioedema associated with ARB.  Discontinue losartan.    Disposition: F/u with Dr. [name] or an APP in 2 months, and EP as directed.    Medication Adjustments/Labs and Tests Ordered: Current medicines are reviewed at length with the patient today.  Concerns regarding medicines are outlined above. Medication changes, Labs and Tests ordered today are summarized above and listed in the Patient Instructions accessible in Encounters.   Signed, [name], PA-C 04/23/2022 5:20 PM     [address] [place],  [address] [phone]

## 2022-04-22 ENCOUNTER — Other Ambulatory Visit (HOSPITAL_COMMUNITY): Payer: Self-pay

## 2022-04-23 ENCOUNTER — Encounter: Payer: Self-pay | Admitting: Physician Assistant

## 2022-04-23 ENCOUNTER — Ambulatory Visit: Payer: 59 | Attending: Physician Assistant | Admitting: Physician Assistant

## 2022-04-23 VITALS — BP 120/88 | HR 81 | Ht 66.0 in | Wt 215.0 lb

## 2022-04-23 DIAGNOSIS — R09A2 Foreign body sensation, throat: Secondary | ICD-10-CM

## 2022-04-23 DIAGNOSIS — I4819 Other persistent atrial fibrillation: Secondary | ICD-10-CM

## 2022-04-23 DIAGNOSIS — I471 Supraventricular tachycardia, unspecified: Secondary | ICD-10-CM

## 2022-04-23 DIAGNOSIS — I10 Essential (primary) hypertension: Secondary | ICD-10-CM | POA: Diagnosis not present

## 2022-04-23 DIAGNOSIS — K219 Gastro-esophageal reflux disease without esophagitis: Secondary | ICD-10-CM | POA: Diagnosis not present

## 2022-04-23 DIAGNOSIS — G4733 Obstructive sleep apnea (adult) (pediatric): Secondary | ICD-10-CM

## 2022-04-23 DIAGNOSIS — E7849 Other hyperlipidemia: Secondary | ICD-10-CM

## 2022-04-23 MED ORDER — AMLODIPINE BESYLATE 5 MG PO TABS: 5.0000 mg | ORAL_TABLET | Freq: Every day | ORAL | 3 refills | Status: DC

## 2022-04-23 NOTE — Patient Instructions (Addendum)
Medication Instructions:  Your physician has recommended you make the following change in your medication:   STOP Flecainide  STOP Losartan START Amlodipine 5 mg once daily   *If you need a refill on your cardiac medications before your next appointment, please call your pharmacy*   Lab Work: None  If you have labs (blood work) drawn today and your tests are completely normal, you will receive your results only by: [place] (if you have MyChart) OR A paper copy in the mail If you have any lab test that is abnormal or we need to change your treatment, we will call you to review the results.   Testing/Procedures: None    Follow-Up: At [facility], you and your health needs are our priority.  As part of our continuing mission to provide you with exceptional heart care, we have created designated Provider Care Teams.  These Care Teams include your primary Cardiologist (physician) and Advanced Practice Providers (APPs -  Physician Assistants and Nurse Practitioners) who all work together to provide you with the care you need, when you need it.  Your next appointment:   2 month(s)  The format for your next appointment:   In Person  Provider:   Kate Sable, MD or [name], PA-C       Important Information About [place]

## 2022-04-28 ENCOUNTER — Other Ambulatory Visit: Payer: 59

## 2022-05-06 DIAGNOSIS — N950 Postmenopausal bleeding: Secondary | ICD-10-CM | POA: Diagnosis not present

## 2022-05-22 DIAGNOSIS — G4733 Obstructive sleep apnea (adult) (pediatric): Secondary | ICD-10-CM | POA: Diagnosis not present

## 2022-05-26 ENCOUNTER — Telehealth: Payer: Self-pay | Admitting: Cardiology

## 2022-05-26 DIAGNOSIS — I10 Essential (primary) hypertension: Secondary | ICD-10-CM

## 2022-05-26 DIAGNOSIS — I480 Paroxysmal atrial fibrillation: Secondary | ICD-10-CM

## 2022-05-26 MED ORDER — APIXABAN 5 MG PO TABS: 5.0000 mg | ORAL_TABLET | Freq: Two times a day (BID) | ORAL | 3 refills | Status: DC

## 2022-05-26 MED ORDER — CARVEDILOL 25 MG PO TABS: ORAL_TABLET | ORAL | 3 refills | Status: DC

## 2022-05-26 NOTE — Telephone Encounter (Signed)
*  STAT* If patient is at the pharmacy, call can be transferred to refill team.   1. Which medications need to be refilled? (please list name of each medication and dose if known)  apixaban (ELIQUIS) 5 MG TABS tablet carvedilol (COREG) 25 MG tablet  2. Which pharmacy/location (including street and city if local pharmacy) is medication to be sent to? [place], [place]  3. Do they need a 30 day or 90 day supply?  30 day supply   Patient states she is completely out of medication.

## 2022-06-01 ENCOUNTER — Telehealth: Payer: Self-pay | Admitting: Cardiology

## 2022-06-01 NOTE — Telephone Encounter (Signed)
   Pre-operative Risk Assessment    Patient Name: [name] Ross  DOB: September 20, 1965 MRN: [id number]       Request for Surgical Clearance    Procedure:   LAVH {  Date of Surgery:  Clearance 07/27/22                                 Surgeon:  DR DAVID Corinna Capra Surgeon's Group or Practice Name:  [facility] Phone number:  [phone] Fax number:  [phone]  Type of Clearance Requested:   - Medical    Type of Anesthesia:  General    Additional requests/questions:    Signed, Eli Phillips   06/01/2022, 2:51 PM

## 2022-06-02 DIAGNOSIS — I10 Essential (primary) hypertension: Secondary | ICD-10-CM | POA: Diagnosis not present

## 2022-06-02 DIAGNOSIS — D649 Anemia, unspecified: Secondary | ICD-10-CM | POA: Diagnosis not present

## 2022-06-02 DIAGNOSIS — E785 Hyperlipidemia, unspecified: Secondary | ICD-10-CM | POA: Diagnosis not present

## 2022-06-02 DIAGNOSIS — R7301 Impaired fasting glucose: Secondary | ICD-10-CM | POA: Diagnosis not present

## 2022-06-02 NOTE — Telephone Encounter (Signed)
   Name: [name] Ross  DOB: [birth year]  MRN: [id number]  Primary Cardiologist: Kate Sable, MD  Chart reviewed as part of pre-operative protocol coverage. Because of [name] Ross's past medical history and time since last visit, she will require a follow-up in-office visit in order to better assess preoperative cardiovascular risk.  Pre-op covering staff: - Please schedule appointment and call patient to inform them. If patient already had an upcoming appointment within acceptable timeframe, please add "pre-op clearance" to the appointment notes so provider is aware. - Please contact requesting surgeon's office via preferred method (i.e, phone, fax) to inform them of need for appointment prior to surgery.  Clearance can be discussed at upcoming appointment with [name], PA-C on 1/31.  Per office protocol, patient can hold Eliquis for 2-3 days prior to procedure.   Elgie Collard, PA-C  06/02/2022, 10:41 AM

## 2022-06-02 NOTE — Telephone Encounter (Signed)
Patient with diagnosis of afib on Eliquis for anticoagulation.    Procedure: LAVH - hysterectomy Date of procedure: 07/27/22  CHA2DS2-VASc Score = 2  This indicates a 2.2% annual risk of stroke. The patient's score is based upon: CHF History: 0 HTN History: 1 Diabetes History: 0 Stroke History: 0 Vascular Disease History: 0 Age Score: 0 Gender Score: 1   CrCl 59m/min using adjusted body weight Platelet count 573K  Per office protocol, patient can hold Eliquis for 2-3 days prior to procedure.    **This guidance is not considered finalized until pre-operative APP has relayed final recommendations.**

## 2022-06-08 ENCOUNTER — Telehealth: Payer: Self-pay | Admitting: Cardiology

## 2022-06-08 DIAGNOSIS — R09A2 Foreign body sensation, throat: Secondary | ICD-10-CM | POA: Diagnosis not present

## 2022-06-08 DIAGNOSIS — R82998 Other abnormal findings in urine: Secondary | ICD-10-CM | POA: Diagnosis not present

## 2022-06-08 DIAGNOSIS — E785 Hyperlipidemia, unspecified: Secondary | ICD-10-CM | POA: Diagnosis not present

## 2022-06-08 DIAGNOSIS — R002 Palpitations: Secondary | ICD-10-CM

## 2022-06-08 DIAGNOSIS — G4733 Obstructive sleep apnea (adult) (pediatric): Secondary | ICD-10-CM | POA: Diagnosis not present

## 2022-06-08 DIAGNOSIS — I480 Paroxysmal atrial fibrillation: Secondary | ICD-10-CM | POA: Diagnosis not present

## 2022-06-08 DIAGNOSIS — Z1331 Encounter for screening for depression: Secondary | ICD-10-CM | POA: Diagnosis not present

## 2022-06-08 DIAGNOSIS — I10 Essential (primary) hypertension: Secondary | ICD-10-CM | POA: Diagnosis not present

## 2022-06-08 DIAGNOSIS — Z0000 Encounter for general adult medical examination without abnormal findings: Secondary | ICD-10-CM | POA: Diagnosis not present

## 2022-06-08 DIAGNOSIS — E668 Other obesity: Secondary | ICD-10-CM | POA: Diagnosis not present

## 2022-06-08 NOTE — Telephone Encounter (Signed)
Returned the call to the patient. She stated that since the Flecainide was discontinued, she has been feeling intermittent abnormal rhythms. She is not sure if this is afib. They do not last long but do come more frequently. She denies symptoms except for  shortness of breath when they occur. This will subside after the episode are done.   Appointment moved up to 1/22. Patient will call back for worsening symptoms.

## 2022-06-08 NOTE — Telephone Encounter (Signed)
If she is agreeable, lets go ahead and have her wear a ZIO XT to quantify potential A-fib burden.  This will be helpful information for EP.

## 2022-06-08 NOTE — Telephone Encounter (Signed)
Left a message for the patient to call back.  

## 2022-06-08 NOTE — Telephone Encounter (Signed)
   Pt said, she can feel like she is in Afib but she couldn't catch it when checking her HR. She said, she can also feel it in her breathing. She wants to know if its ok for her to wait till 01/30 for her f/u or needs to be seen sooner

## 2022-06-08 NOTE — Telephone Encounter (Signed)
Patient is returning RN's call. Please advise. 

## 2022-06-08 NOTE — Telephone Encounter (Addendum)
Patient was agreeable. Monitor has been ordered and instructions provided.

## 2022-06-09 ENCOUNTER — Ambulatory Visit: Payer: 59 | Admitting: Neurology

## 2022-06-09 ENCOUNTER — Ambulatory Visit: Payer: 59 | Attending: Physician Assistant

## 2022-06-09 ENCOUNTER — Encounter: Payer: Self-pay | Admitting: Neurology

## 2022-06-09 VITALS — BP 110/76 | HR 81 | Ht 66.0 in | Wt 209.0 lb

## 2022-06-09 DIAGNOSIS — G4761 Periodic limb movement disorder: Secondary | ICD-10-CM | POA: Diagnosis not present

## 2022-06-09 DIAGNOSIS — G4733 Obstructive sleep apnea (adult) (pediatric): Secondary | ICD-10-CM

## 2022-06-09 DIAGNOSIS — G2581 Restless legs syndrome: Secondary | ICD-10-CM | POA: Diagnosis not present

## 2022-06-09 DIAGNOSIS — R002 Palpitations: Secondary | ICD-10-CM

## 2022-06-09 MED ORDER — PRAMIPEXOLE DIHYDROCHLORIDE 0.125 MG PO TABS: 0.3750 mg | ORAL_TABLET | Freq: Every day | ORAL | 5 refills | Status: DC

## 2022-06-09 NOTE — Patient Instructions (Addendum)
Restless leg symptoms can be induced are aggravated by certain conditions including thyroid dysfunction and anemia or iron deficiency. Certain medications can induce leg twitching at night, which we call periodic leg movements and disrupt sleep and also exacerbate restless leg symptoms during wakefulness. These medications often include antidepressants.  You had appropriate blood work recently. You may be able to stop your Iron after a recheck in 3 - 6 months with Dr. Philip Aspen, especially, if the ferritin is above 70 or 80.   You can taper off the gabapentin, as discussed with Dr. Philip Aspen.   We will start you on low dose Mirapex (generic name: pramipexole) 0.125 mg: Take 1 pill each night for 2 weeks, the 2 pills each night for 2 weeks, then 3 pills each night thereafter. Common side effects reported are: Sedation, sleepiness, nausea, vomiting, and rare side effects are confusion, hallucinations, swelling in legs, and abnormal behaviors, including impulse control problems, which can manifest as excessive eating, obsessions with food or gambling, or hypersexuality.   Take it around 8 PM, 1-2 hours before your bedtime.   Please follow up to see Jinny Blossom, NP in about 4 months. Keep [place] posted as to how you are doing. If you feel, that your restless legs symptoms are well controlled on 1 or 2 pills each night, you can stay on the lower dose.   Continue to use your CPAP compliantly, as you are.

## 2022-06-09 NOTE — Progress Notes (Signed)
Subjective:    Patient ID: [name] Ross is a 57 y.o. female.  HPI    Interim history:  [name] Ross is a 57 year old right-handed woman with an underlying medical history of vertigo, hyperlipidemia, hypertension, A fib with s/p ablation in March 2023, depression, restless leg syndrome, migraine headaches, allergic rhinitis, and obesity, who presents for a new problem visit of restless leg syndrome.  The patient is unaccompanied today and is referred by her primary care physician, Dr. Sharlett Iles.  We have followed her in sleep clinic for her sleep apnea for which she has been on CPAP of 10 cm with excellent compliance.  I last saw her on 09/29/2021, at which time she was compliant with her CPAP. She was advised to follow-up routinely in 1 year for sleep apnea recheck.  Today, 06/09/2022: She reports a longstanding Hx of RLS. Her symptoms, have been intermittent and self-contained so she never really took any medication until a few months ago.  She has had more symptoms at night, particularly when she gets into bed, not necessarily in the evening leading up to bedtime.  She has tried gabapentin through primary care up to the dose of 900 mg at night but she had significant side effects including cognitive slowing and forgetfulness and wants to come off of it.  She has discussed tapering off the gabapentin.  She had recent blood work through primary care and brought a copy for me for review.  She had blood work on 06/02/2022, lipid panel showed benign total cholesterol at 191, triglycerides mildly elevated at 226, LDL 92, chemistry panel showed BUN of 17, creatinine 0.8, alk phos 38, AST 13, ALT also normal.  Iron studies showed normal ferritin level at 69.5, iron and TIBC normal, TSH normal at 1.22.  She was started on Nu iron by her PCP and continues to take 1 a day.  She does have a history of anemia in the past.  She has a history of leg twitching and movements at night, worse in the past several months.  She has  never been on a dopamine agonist.  Incidentally, her husband takes pramipexole.  She reports intermittent very brief palpitations.  She is worried about her A-fib returning.  She continues to take Eliquis but was taken off her flecainide.  After that she started having these brief palpitations, she is supposed to do a heart monitor soon.  She is supposed to have a hysterectomy in the spring.  She has not had any recent medication changes.  Of note, she has been on Lexapro for years, no recent dose change.  She is currently on Ozempic, has been on it for about a year, not on Mounjaro.  She limits her caffeine, she tries to stay well-hydrated with water.  She has reduced soda and tea intake.  She is working on weight loss.  She is compliant with her CPAP, I reviewed her compliance data for the last month from 05/10/2022 through 06/08/2022, during which time she used her machine every night, average usage of 9 hours and 45 minutes, residual AHI 0.7/h, leak on the low side, pressure of 10 cm with EPR of 3.   Previously:   She saw Megan on 06/16/2021, at which time she was compliant with her CPAP of 10 cm, but her machine showed an error message that the motor life had exceeded.  She was advised to proceed with a home sleep test.  She had a home sleep test on 06/30/2021 which confirmed severe  obstructive sleep apnea with an AHI of 50.8/h, O2 nadir 78% with moderate to loud snoring detected.  She was prescribed a new CPAP machine at 10 cm.  Her set up date was 07/16/2021.  She has a ResMed AirSense 10 AutoSet machine.  I saw her on 04/04/2019, at which time she reported having difficulty falling asleep.  She was compliant with her CPAP of 9 cm.  I suggested we increase her set pressure to 10 cm due to borderline elevated residual AHI.     I reviewed her AutoPap compliance data from 07/16/2021 through 08/14/2021, which is a total of 30 days, during which time she used her machine 29 days with percent use days greater  than 4 hours at 97%, indicating excellent compliance with an average usage of 10 hours and 55 minutes, residual AHI 0.8/h, at goal, leak on the higher side with the 95th percentile at 18.5 L/min on a pressure of 10 cm with EPR of 3.      04/04/19: (She) was originally diagnosed with obstructive sleep apnea in 2010.  She had sleep study testing in July 2017 which indicated moderate obstructive sleep apnea with an AHI of 18/h.  O2 nadir was 95%.  She had a CPAP titration study in August 2017 and did well with CPAP of 9 cm.  She has been using her CPAP.  I reviewed her CPAP compliance data from 03/04/2019 through 04/02/2019 which is a total of 30 days, during which time she used her machine every night with percent use days greater than 4 hours at 100%, indicating superb compliance with an extended use on average of 10 hours and 14 minutes, residual AHI borderline at 5.6/h, leak on the low side with a 95th percentile at 3.1 L/min on a pressure of 9 cm with EPR of 3.  Her Epworth sleepiness score is 7 out of 24, fatigue severity score is 46 out of 63.  She reports having longer standing issues falling asleep.  She has not tried melatonin.  She does not like to take Ambien because of side effects including excessive spending and excessive shopping.  She does like to watch TV or use her cell phone and watch videos on her phone while in bed.  She tries to go to bed around 10 but does not fall asleep until 1 AM typically.  Rise time is around 9 or 10 AM typically.  She is buying her supplies directly from the DME company.  She uses nasal pillows.  Sometimes she feels she has to tighten the headgear and it leaves marks on her face in the morning. I reviewed your virtual visit note from 03/13/2019. She reports a recent bout of vertigo about 3 weeks ago.   02/12/2015: 57 year old right-handed woman with an underlying medical history of vertigo, hyperlipidemia, hypertension, rhinitis, depression, obesity, restless leg  syndrome and PLMS, who had a sleep study in 2010 which showed mild to moderate obstructive sleep apnea and she was placed on CPAP treatment. She uses CPAP for a few months but stopped using it. She found it hard to use and found it difficult to tolerate. She stopped using it for about 4 years but recently tried it a few more times, most recently some 3 weeks ago. I reviewed her prior sleep study results from 11/12/2008 which was a diagnostic polysomnogram. Sleep efficiency was 68.7%. Sleep latency was 17.5 minutes. She had an increased percentage of stage II sleep, a mildly decreased percentage of slow-wave sleep, an increased percentage  of stage I sleep and near absence of REM sleep. Total AHI was 13.9 per hour, rising to 16.6 per hour during supine sleep. Lowest oxygen saturation was 87%. She was invited back for sleep study with CPAP. She had this on 12/06/2008: Sleep efficiency was 96.3%, sleep latency was quick at 1.5 minutes. REM percentage was 8.4%, she had an increased percentage of slow-wave sleep, she was treated with CPAP from 4 cm to 11 cm. On the final pressure her AHI was 1.8 per hour. She was prescribed CPAP therapy at 11 cm. I reviewed a CPAP compliance download from 02/09/2009 through 03/08/2009 which is a total of 28 days during which time she used her machine 21 days with percent used days greater than 4 hours at 46%, indicating suboptimal compliance, residual AHI was 0.9, leak was low, set pressure at 11 cm. She has some restless leg symptoms. 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 At the time of her previous study she weighed 198 pounds. She has gained weight, particularly started gaining weight several years ago after her father committed suicide in 2008. She lost her mother last year in November. She is on Lexapro generic for her depression which is working fairly well for her. She is also on Wellbutrin XL 300 mg daily. She had tried weight loss medication in the past but she had side  effects including palpitations. She did lose some weight on the medication. She would like to be able to use something else that does not cause her side effects. She has no overt family history of obstructive sleep apnea. Her husband has been diagnosed with sleep apnea and uses a CPAP machine and has pushed her to be reevaluated.  Her Epworth sleepiness score is 12 out of 24 today, her fatigue sco she is increasing her water intake. She is a nonsmoker. She drinks alcohol very rarely.  I reviewed your office note from 01/04/2015 which you kindly included.     Her Past Medical History Is Significant For: Past Medical History:  Diagnosis Date   Allergy    Anal skin tag    Anemia    Anxiety    Atrial fibrillation ([place])    Depression    Diabetes mellitus without complication ([place])    pre DM   Diverticulosis    Diverticulosis, sigmoid    Dyslipidemia    GERD (gastroesophageal reflux disease)    Hemorrhoids    internal and external   History of diverticulitis    10/ 2016   History of galactorrhea    History of stress test    a. Pt reports h/o stress test and echo by cardiologist in [place] (no records in [place]).  Both reportedly nl.   Hyperlipidemia    Hypertension    IBS (irritable bowel syndrome)    Morbid obesity ([facility])    PONV (postoperative nausea and vomiting)    severe   Rash    elbows and behind knees   Restless leg syndrome    Sleep apnea    wears c-pap   Wears glasses     Her Past Surgical History Is Significant For: Past Surgical History:  Procedure Laterality Date   ATRIAL FIBRILLATION ABLATION N/A 07/31/2021   Procedure: ATRIAL FIBRILLATION ABLATION;  Surgeon: Vickie Epley, MD;  Location: [facility];  Service: Cardiovascular;  Laterality: N/A;   BAND HEMORRHOIDECTOMY  2012   Dr Earlean Shawl   CARDIOVERSION N/A 04/08/2021   Procedure: CARDIOVERSION;  Surgeon: Nelva Bush, MD;  Location: [facility];  Service: Cardiovascular;  Laterality: N/A;   CESAREAN  SECTION  1994   COLONOSCOPY  06/18/2011   CYSTO/  BILATERAL PYELOGRAM RETROGRADES/  TRANSOBTURATOR SLING  03/08/2006   EXCISION OF SKIN TAG N/A 02/13/2016   Procedure: ANAL SKIN TAG EXCISION;  Surgeon: [name], MD;  Location: [facility];  Service: General;  Laterality: N/A;   LAPAROSCOPIC CHOLECYSTECTOMY  05/22/2001   SHOULDER SURGERY      Her Family History Is Significant For: Family History  Problem Relation Age of Onset   Colon cancer Mother    Diabetes Mother    Hypertension Mother    Atrial fibrillation Mother    Diabetes Father        committed suicide   Skin cancer Father    Suicidality Father    Hypertension Father    Diabetes Sister    Diabetes Sister    Breast cancer Other        materanl great aunt   Sleep apnea Neg Hx    Esophageal cancer Neg Hx    Rectal cancer Neg Hx    Stomach cancer Neg Hx    Restless legs syndrome Neg Hx     Her Social History Is Significant For: Social History   Socioeconomic History   Marital status: Married    Spouse name: Doctor, general practice   Number of children: 2   Years of education: RN   Highest education level: Not on file  Occupational History   Occupation: RN  Tobacco Use   Smoking status: Never   Smokeless tobacco: Never   Tobacco comments:    Never smoke 08/26/21  Vaping Use   Vaping Use: Never used  Substance and Sexual Activity   Alcohol use: No    Alcohol/week: 0.0 standard drinks of alcohol   Drug use: No   Sexual activity: Not on file    Comment: post menopausal per pt  Other Topics Concern   Not on file  Social History Narrative   Lives in [place].  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  Occasionally drinks coffee but not a heavy caffeine drinker.     Social Determinants of Health   Financial Resource Strain: Not on file  Food Insecurity: Not on file  Transportation Needs: Not on file  Physical Activity: Not on file  Stress: Not on file  Social Connections: Not on file     Her Allergies Are:  Allergies  Allergen Reactions   Metformin Hcl     Other reaction(s): bloating Other reaction(s): bloating    Venlafaxine     Sleepy  :   Her Current Medications Are:  Outpatient Encounter Medications as of 06/09/2022  Medication Sig   acetaminophen (TYLENOL) 500 MG tablet Take 1,000 mg by mouth every 6 (six) hours as needed for mild pain.   albuterol (VENTOLIN HFA) 108 (90 Base) MCG/ACT inhaler Inhale 2 puffs into the lungs every 6 (six) hours as needed for wheezing or shortness of breath.   amLODipine (NORVASC) 5 MG tablet Take 1 tablet (5 mg total) by mouth daily.   apixaban (ELIQUIS) 5 MG TABS tablet Take 1 tablet (5 mg total) by mouth 2 (two) times daily.   Ascorbic Acid (VITAMIN C) 1000 MG tablet    buPROPion (WELLBUTRIN XL) 300 MG 24 hr tablet Take 300 mg by mouth every morning.    butalbital-acetaminophen-caffeine (FIORICET) 50-325-40 MG tablet Take 1 tablet by mouth 2 (two) times daily as needed for headache.  carvedilol (COREG) 25 MG tablet TAKE (2) TABLETS BY MOUTH TWICE DAILY.   dicyclomine (BENTYL) 10 MG capsule Take one tablet by mouth once to twice daily as needed   escitalopram (LEXAPRO) 20 MG tablet Take 20 mg by mouth every morning.    estradiol (ESTRACE) 2 MG tablet Take 2 mg by mouth daily.   fenofibrate micronized (LOFIBRA) 200 MG capsule Take 200 mg by mouth daily before breakfast.    fexofenadine (ALLEGRA) 180 MG tablet    fluticasone (FLONASE) 50 MCG/ACT nasal spray Place 1 spray into both nostrils daily.   ibuprofen (ADVIL) 200 MG tablet Take 600-800 mg by mouth every 6 (six) hours as needed for moderate pain.   linaclotide (LINZESS) 72 MCG capsule Take 72 mcg by mouth daily as needed (constipation).   lubiprostone (AMITIZA) 24 MCG capsule TAKE ONE CAPSULE BY MOUTH TWICE DAILY WITH FOOD   magnesium oxide (MAG-OX) 400 (240 Mg) MG tablet Take 400 mg by mouth daily.   meclizine (ANTIVERT) 25 MG tablet Take 1 tablet (25 mg total) by mouth  3 (three) times daily as needed for dizziness.   mometasone (ELOCON) 0.1 % ointment Apply 1 application. topically 2 (two) times daily as needed (rash).   omega-3 acid ethyl esters (LOVAZA) 1 g capsule Take 1 capsule (1 g total) by mouth 2 (two) times daily.   omeprazole (PRILOSEC) 40 MG capsule Take 1 capsule (40 mg total) by mouth in the morning and at bedtime.   ondansetron (ZOFRAN-ODT) 8 MG disintegrating tablet Take 8 mg by mouth every 8 (eight) hours as needed.   pramipexole (MIRAPEX) 0.125 MG tablet Take 3 tablets (0.375 mg total) by mouth at bedtime. Follow instructions provided.   progesterone (PROMETRIUM) 200 MG capsule Take 200 mg by mouth at bedtime.   Semaglutide, 2 MG/DOSE, (OZEMPIC, 2 MG/DOSE,) 8 MG/3ML SOPN Inject 2 mg into the skin once a week.   tacrolimus (PROTOPIC) 0.1 % ointment Apply 1 application. topically 2 (two) times daily as needed (rash).   triamcinolone (KENALOG) 0.1 % Apply 1 application. topically 2 (two) times daily as needed (rash).   triamterene-hydrochlorothiazide (MAXZIDE-25) 37.5-25 MG per tablet Take 0.5 tablets by mouth every morning.    valACYclovir (VALTREX) 1000 MG tablet Take 1,000 mg by mouth 2 (two) times daily as needed (cold sores).   zolpidem (AMBIEN) 10 MG tablet Take 10 mg by mouth at bedtime as needed for sleep.   diphenhydrAMINE (BENADRYL) 25 MG tablet Take 25 mg by mouth at bedtime.   MOUNJARO 10 MG/0.5ML Pen Inject into the skin. (Patient not taking: Reported on 04/23/2022)   Facility-Administered Encounter Medications as of 06/09/2022  Medication   0.9 %  sodium chloride infusion  :  Review of Systems:  Out of a complete 14 point review of systems, all are reviewed and negative with the exception of these symptoms as listed below:  Review of Systems  Neurological:        Pt here for RLS  Pt states RLS is worse at night Pt states legs are jerking. Pt states its hard for her to sleep at night    ESS:6      Objective:  Neurological  Exam  Physical Exam Physical Examination:   Vitals:   06/09/22 0830  BP: 110/76  Pulse: 81   General Examination: The patient is a very pleasant 57 y.o. female in no acute distress. She appears well-developed and well-nourished and well groomed.  Good spirits.  HEENT: Normocephalic, atraumatic, pupils are equal, round  and reactive to light, extraocular tracking is well preserved, corrective eyeglasses in place.  Face is symmetric, hearing is grossly intact, airway examination reveals no significant mouth dryness, adequate dental hygiene, mild to moderate airway crowding, small airway entry, Mallampati class II.  Tonsils are small.  Tongue protrudes centrally in palate elevates symmetrically.  She has no carotid bruits.   Chest: Clear to auscultation without wheezing, rhonchi or crackles noted.   Heart: S1+S2+0, regular and normal without murmurs, rubs or gallops noted.    Abdomen: Soft, non-tender and non-distended.   Extremities: There is no obvious edema in the distal lower extremities bilaterally.    Skin: Warm and dry without trophic changes noted.    Musculoskeletal: exam reveals no obvious joint deformities.  Neurologically:  Mental status: The patient is awake, alert and oriented in all 4 spheres. Her immediate and remote memory, attention, language skills and fund of knowledge are appropriate. There is no evidence of aphasia, agnosia, apraxia or anomia. Speech is clear with normal prosody and enunciation. Thought process is linear. Mood is normal and affect is normal.  Cranial nerves II - XII are as described above under HEENT exam.  Motor exam: Normal bulk, strength and tone is noted. There is no resting or action tremor. Fine motor skills and coordination: grossly intact.  Reflexes are 1+ in the upper extremities, trace in the knees and ankles.   Cerebellar testing: No dysmetria or intention tremor. There is no truncal or gait ataxia.  Sensory exam: intact to light touch.   Gait, station and balance: She stands easily. No veering to one side is noted. No leaning to one side is noted. Posture is age-appropriate and stance is narrow based. Gait shows normal stride length and normal pace. No problems turning are noted.    Assessment and Plan:    In summary, MAKAYELA SECREST is a very pleasant 57 year old female with an underlying medical history of vertigo, hyperlipidemia, hypertension, A fib with s/p ablation in March 2023, depression, restless leg syndrome, migraine headaches, allergic rhinitis, restless leg syndrome, OSA on CPAP, and obesity, who presents for evaluation of her longstanding history of restless leg syndrome.  Of note, she continues to be compliant with her CPAP, I did review her compliance data for the past month and she is fully compliant with treatment.  She is commended for her treatment adherence.  Of note, she received a new CPAP machine February 2023.  Her home sleep test from 06/30/2021 showed an AHI of 50.8/h, O2 nadir 78%.  She has been compliant with her CPAP of 10 cm via full facemask.   Previously, she had a baseline sleep study on 12/03/2015 and a subsequent CPAP titration study on 01/06/2016.  We talked about restless leg triggers, we talked about medication effect such as exacerbation of restless leg syndrome and PLM's on antidepressant medications particularly SSRI type medications or SNRI type medications.  She is stable on her antidepressants, no need for change.  She has a history of anemia but her ferritin level is good at this time, she continues to take Nu Iron.  She is advised to taper off the gabapentin as she discussed with her primary care.  She is advised to start a trial of low-dose pramipexole with 0.125 mg strength, take 1 in the evening, about an hour or 2 before bedtime.  She usually goes to bed between 9 and 10 and is therefore advised to take it around 8 PM for now.  We talked about  expectations and limitations as well as possible common  side effects, the black box warning of impulse control disorder was explained to her as well.  She was given an electronic prescription and instructions verbally as well as in [place]. After 2 weeks she can increase the pramipexole to 2 pills and after another 2 weeks she can go up to 3 pills each night.  If she feels that she has good symptom control on the lower dose such as 2 pills or 1 pill even, she can certainly stay on the lower dose.  She is advised to keep [place] posted via MyChart messaging and advised to follow-up routinely in this clinic to see one of our nurse practitioners in about 4 months.  I answered all her questions today and she was in agreement.  I spent 40 minutes in total face-to-face time and in reviewing records during pre-charting, more than 50% of which was spent in counseling and coordination of care, reviewing test results, reviewing medications and treatment regimen and/or in discussing or reviewing the diagnosis of RLS, PLMs, OSA, the prognosis and treatment options. Pertinent laboratory and imaging test results that were available during this visit with the patient were reviewed by me and considered in my medical decision making (see chart for details).

## 2022-06-10 ENCOUNTER — Other Ambulatory Visit (HOSPITAL_COMMUNITY): Payer: Self-pay

## 2022-06-11 DIAGNOSIS — D2272 Melanocytic nevi of left lower limb, including hip: Secondary | ICD-10-CM | POA: Diagnosis not present

## 2022-06-11 DIAGNOSIS — D2262 Melanocytic nevi of left upper limb, including shoulder: Secondary | ICD-10-CM | POA: Diagnosis not present

## 2022-06-11 DIAGNOSIS — D2261 Melanocytic nevi of right upper limb, including shoulder: Secondary | ICD-10-CM | POA: Diagnosis not present

## 2022-06-11 DIAGNOSIS — L538 Other specified erythematous conditions: Secondary | ICD-10-CM | POA: Diagnosis not present

## 2022-06-11 DIAGNOSIS — D225 Melanocytic nevi of trunk: Secondary | ICD-10-CM | POA: Diagnosis not present

## 2022-06-11 DIAGNOSIS — L820 Inflamed seborrheic keratosis: Secondary | ICD-10-CM | POA: Diagnosis not present

## 2022-06-11 DIAGNOSIS — D2271 Melanocytic nevi of right lower limb, including hip: Secondary | ICD-10-CM | POA: Diagnosis not present

## 2022-06-11 DIAGNOSIS — L821 Other seborrheic keratosis: Secondary | ICD-10-CM | POA: Diagnosis not present

## 2022-06-12 DIAGNOSIS — R002 Palpitations: Secondary | ICD-10-CM

## 2022-06-13 NOTE — Progress Notes (Signed)
Cardiology Office Note    Date:  06/15/2022   ID:  [name] Ross, DOB [birth year], MRN [id number]  PCP:  Donnajean Lopes, MD  Cardiologist:  Kate Sable, MD  Electrophysiologist:  Vickie Epley, MD   Chief Complaint: Palpitations and preoperative cardiac risk stratification  History of Present Illness:   [name] Ross is a 57 y.o. female with history of normal coronaries by coronary CTA in 07/2021, PAF status post ablation in 07/2021, SVT, HTN, HLD, obesity, OSA on CPAP, and GERD who presents for preoperative cardiac risk stratification and evaluation of palpitations.   She was admitted in 11/2019 with A. fib with RVR.  Echo in 11/2019 showed an EF of 60 to 65%, no regional wall motion abnormalities, normal LV diastolic function parameters, normal RV systolic function and ventricular cavity size, and trivial mitral regurgitation.  She was placed on anticoagulation along with carvedilol.  Subsequent office visit in 12/2019 documented sinus rhythm.  Due to recurrence of palpitations, outpatient cardiac monitoring in 07/2020 demonstrated a predominant rhythm of sinus with an average heart rate of 83 bpm (range 56 to 197 bpm), 148 episodes of SVT occurred with the longest lasting 13 beats, A. fib was noted with a less than 1% burden with the longest episode lasting 28 minutes and 17 seconds.  SVT and A. fib were detected with patient triggered events.  She was initiated on flecainide therapy in 06/2020 with continuation of carvedilol and diltiazem.  Treadmill MPI on 08/30/2020, demonstrated she was able to exercise for 5 minutes and was not able to achieve 85% MPHR, therefore was transitioned to [place].  Stress test was low risk overall.  CT attenuated corrected images showed no evidence of coronary artery calcifications or aortic atherosclerosis.  She has been followed by the A. fib clinic and EP.  She was admitted in 03/2021 for A. fib with RVR and subsequently cardioverted on 04/08/2021.  Echo  during that admission demonstrated an EF of 60 to 65%, no regional wall motion abnormalities, mild asymmetric left ventricular hypertrophy of the basal septal segment, indeterminate LV diastolic function parameters, normal RV systolic function and ventricular cavity size, and a trivial mitral regurgitation.  Coronary CTA in 07/2021 showed a calcium score of 0 and no evidence of coronary artery disease.  She underwent successful A-fib as well as CTI dependent flutter ablation on 07/31/2021.  After follow-up with EP in 09/2021, she did continue to have some intermittent palpitations, though these appeared to be consistent with PACs or short runs of atrial tach.   She has been evaluated by GI and ENT for globus sensation as well as reflux with reassuring laryngoscopy by ENT with recommendation for trial of holding losartan to see if this improved her globus sensation.  She has subsequently undergone EGD in 10/2021 which was largely unrevealing with recommendation to continue PPI for treatment of GERD and possible nonerosive globus.  She was last seen in the office in 03/2022 and was without symptoms of angina or decompensation.  She was made ending sinus rhythm, without symptoms concerning for recurrence of A-fib.  As noted in prior EP documentation, we discontinued flecainide.  Given globus sensation, we discontinued ARB.  We received cardiac risk stratification for laparoscopic assisted vaginal hysterectomy with recommendation to follow-up today.  Chart has been reviewed by our pharmacy team indicating patient can hold apixaban for 2 to 3 days prior to procedure.  We subsequently received phone call from the patient indicating palpitations following the discontinuation of  flecainide.  Zio patch was mailed to the patient and is currently pending at this time.   She comes in doing well from a cardiac perspective and is without symptoms of angina or decompensation.  No dyspnea, dizziness, presyncope, or syncope.  She  has noted split-second palpitations over the past month or so following discontinuation of flecainide.  Currently wearing Zio patch.  No falls or concern for progressive/abnormal bleeding.  D[facility] Activity Status Index: > 4 METs Revised Cardiac Risk Index: Low risk for noncardiac surgery with an estimated rate of 0.9% for adverse cardiac event in the perioperative timeframe     Labs independently reviewed: 06/2021 - Hgb 11.1, PLT 573, BUN 16, serum creatinine 0.85, potassium 4.8 03/2021 - magnesium 2.0, A1c 5.9, TSH normal, free T4 normal, albumin 3.5, AST/ALT normal 11/2019 - direct LDL 66, TC 204, TG 510, HDL 39   Past Medical History:  Diagnosis Date   Allergy    Anal skin tag    Anemia    Anxiety    Atrial fibrillation ([place])    Depression    Diabetes mellitus without complication ([place])    pre DM   Diverticulosis    Diverticulosis, sigmoid    Dyslipidemia    GERD (gastroesophageal reflux disease)    Hemorrhoids    internal and external   History of diverticulitis    10/ 2016   History of galactorrhea    History of stress test    a. Pt reports h/o stress test and echo by cardiologist in [place] (no records in [place]).  Both reportedly nl.   Hyperlipidemia    Hypertension    IBS (irritable bowel syndrome)    Morbid obesity (H[facility])    PONV (postoperative nausea and vomiting)    severe   Rash    elbows and behind knees   Restless leg syndrome    Sleep apnea    wears c-pap   Wears glasses     Past Surgical History:  Procedure Laterality Date   ATRIAL FIBRILLATION ABLATION N/A 07/31/2021   Procedure: ATRIAL FIBRILLATION ABLATION;  Surgeon: Vickie Epley, MD;  Location: [facility];  Service: Cardiovascular;  Laterality: N/A;   BAND HEMORRHOIDECTOMY  2012   Dr Earlean Shawl   CARDIOVERSION N/A 04/08/2021   Procedure: CARDIOVERSION;  Surgeon: Nelva Bush, MD;  Location: [facility];  Service: Cardiovascular;  Laterality: N/A;   [place]   COLONOSCOPY   06/18/2011   CYSTO/  BILATERAL PYELOGRAM RETROGRADES/  TRANSOBTURATOR SLING  03/08/2006   EXCISION OF SKIN TAG N/A 02/13/2016   Procedure: ANAL SKIN TAG EXCISION;  Surgeon: [name], MD;  Location: [facility];  Service: General;  Laterality: N/A;   LAPAROSCOPIC CHOLECYSTECTOMY  05/22/2001   SHOULDER SURGERY      Current Medications: Current Meds  Medication Sig   acetaminophen (TYLENOL) 500 MG tablet Take 1,000 mg by mouth every 6 (six) hours as needed for mild pain.   albuterol (VENTOLIN HFA) 108 (90 Base) MCG/ACT inhaler Inhale 2 puffs into the lungs every 6 (six) hours as needed for wheezing or shortness of breath.   amLODipine (NORVASC) 5 MG tablet Take 1 tablet (5 mg total) by mouth daily.   apixaban (ELIQUIS) 5 MG TABS tablet Take 1 tablet (5 mg total) by mouth 2 (two) times daily.   Ascorbic Acid (VITAMIN C) 1000 MG tablet Take by mouth daily.   buPROPion (WELLBUTRIN XL) 300 MG 24 hr tablet Take 300 mg by mouth every  morning.    butalbital-acetaminophen-caffeine (FIORICET) 50-325-40 MG tablet Take 1 tablet by mouth 2 (two) times daily as needed for headache.   carvedilol (COREG) 25 MG tablet TAKE (2) TABLETS BY MOUTH TWICE DAILY.   dicyclomine (BENTYL) 10 MG capsule Take one tablet by mouth once to twice daily as needed   escitalopram (LEXAPRO) 20 MG tablet Take 20 mg by mouth every morning.    estradiol (ESTRACE) 2 MG tablet Take 2 mg by mouth daily.   fenofibrate micronized (LOFIBRA) 200 MG capsule Take 200 mg by mouth daily before breakfast.    fexofenadine (ALLEGRA) 180 MG tablet    fluticasone (FLONASE) 50 MCG/ACT nasal spray Place 1 spray into both nostrils daily.   ibuprofen (ADVIL) 200 MG tablet Take 600-800 mg by mouth every 6 (six) hours as needed for moderate pain.   lubiprostone (AMITIZA) 24 MCG capsule TAKE ONE CAPSULE BY MOUTH TWICE DAILY WITH FOOD   magnesium oxide (MAG-OX) 400 (240 Mg) MG tablet Take 400 mg by mouth daily.   meclizine (ANTIVERT)  25 MG tablet Take 1 tablet (25 mg total) by mouth 3 (three) times daily as needed for dizziness.   mometasone (ELOCON) 0.1 % ointment Apply 1 application. topically 2 (two) times daily as needed (rash).   omega-3 acid ethyl esters (LOVAZA) 1 g capsule Take 1 capsule (1 g total) by mouth 2 (two) times daily.   omeprazole (PRILOSEC) 40 MG capsule Take 1 capsule (40 mg total) by mouth in the morning and at bedtime.   ondansetron (ZOFRAN-ODT) 8 MG disintegrating tablet Take 8 mg by mouth every 8 (eight) hours as needed.   pramipexole (MIRAPEX) 0.125 MG tablet Take 3 tablets (0.375 mg total) by mouth at bedtime. Follow instructions provided.   progesterone (PROMETRIUM) 200 MG capsule Take 200 mg by mouth at bedtime.   Semaglutide, 2 MG/DOSE, (OZEMPIC, 2 MG/DOSE,) 8 MG/3ML SOPN Inject 2 mg into the skin once a week.   tacrolimus (PROTOPIC) 0.1 % ointment Apply 1 application. topically 2 (two) times daily as needed (rash).   triamcinolone (KENALOG) 0.1 % Apply 1 application. topically 2 (two) times daily as needed (rash).   triamterene-hydrochlorothiazide (MAXZIDE-25) 37.5-25 MG per tablet Take 0.5 tablets by mouth every morning.    valACYclovir (VALTREX) 1000 MG tablet Take 1,000 mg by mouth 2 (two) times daily as needed (cold sores).   zolpidem (AMBIEN) 10 MG tablet Take 10 mg by mouth at bedtime as needed for sleep.   Current Facility-Administered Medications for the 06/15/22 encounter (Office Visit) with Rise Mu, PA-C  Medication   0.9 %  sodium chloride infusion    Allergies:   Metformin hcl and Venlafaxine   Social History   Socioeconomic History   Marital status: Married    Spouse name: [name] Ross   Number of children: 2   Years of education: RN   Highest education level: Not on file  Occupational History   Occupation: RN  Tobacco Use   Smoking status: Never   Smokeless tobacco: Never   Tobacco comments:    Never smoke 08/26/21  Vaping Use   Vaping Use: Never used  Substance and  Sexual Activity   Alcohol use: No    Alcohol/week: 0.0 standard drinks of alcohol   Drug use: No   Sexual activity: Not on file    Comment: post menopausal per pt  Other Topics Concern   Not on file  Social History Narrative   Lives in [place].  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  Occasionally drinks coffee but not a heavy caffeine drinker.     Social Determinants of Health   Financial Resource Strain: Not on file  Food Insecurity: Not on file  Transportation Needs: Not on file  Physical Activity: Not on file  Stress: Not on file  Social Connections: Not on file     Family History:  The patient's family history includes Atrial fibrillation in her mother; Breast cancer in an other family member; Colon cancer in her mother; Diabetes in her father, mother, sister, and sister; Hypertension in her father and mother; Skin cancer in her father; Suicidality in her father. There is no history of Sleep apnea, Esophageal cancer, Rectal cancer, Stomach cancer, or Restless legs syndrome.  ROS:   12-point review of systems is negative unless otherwise noted in the HPI.   EKGs/Labs/Other Studies Reviewed:    Studies reviewed were summarized above. The additional studies were reviewed today:  Coronary CTA 07/24/2021: Aorta:  Normal size.  No calcifications.  No dissection.   Aortic Valve:  Trileaflet.  No calcifications.   Coronary Arteries:  Normal coronary origin.  Right dominance.   RCA is a dominant artery that gives rise to PDA and PLA. There is no plaque.   Left main is a large artery that gives rise to LAD and LCX arteries. LM has no disease   LAD has no plaque.   LCX is a non-dominant artery that gives rise to one OM1 branch. There is no plaque.   Other findings:   Normal pulmonary vein drainage into the left atrium.   Normal left atrial appendage without a thrombus.   Normal size of the pulmonary artery.   IMPRESSION: 1. Normal coronary calcium  score of 0. Patient is low risk for coronary events. 2. Normal coronary origin with right dominance. 3. No evidence of CAD. 4. CAD-RADS 0. Consider non-atherosclerotic causes of chest pain. __________   2D echo 04/07/2021: 1. Left ventricular ejection fraction, by estimation, is 60 to 65%. The  left ventricle has normal function. The left ventricle has no regional  wall motion abnormalities. There is mild asymmetric left ventricular  hypertrophy of the basal-septal segment.  Left ventricular diastolic parameters are indeterminate.   2. Right ventricular systolic function is normal. The right ventricular  size is normal. Tricuspid regurgitation signal is inadequate for assessing  PA pressure.   3. The mitral valve is normal in structure. Trivial mitral valve  regurgitation. No evidence of mitral stenosis.   4. The aortic valve is normal in structure. Aortic valve regurgitation is  not visualized. No aortic stenosis is present. __________   Carlton Adam MPI 08/30/2020: Blood pressure demonstrated a normal response to exercise. The patient exercised for 5 minutes and was not able to achieve 85% maximum predicted heart rate and thus switched to [place]. There was no ST segment deviation noted during stress. The study is normal. This is a low risk study. The left ventricular ejection fraction is normal (55-65%). CT attenuation images showed no evidence of aortic or coronary calcifications. __________   Elwyn Reach patch 06/2020: Patient had a min HR of 53 bpm, max HR of 197 bpm, and avg HR of 83 bpm. Predominant underlying rhythm was Sinus Rhythm. 148 Supraventricular Tachycardia runs occurred, the run with the fastest interval lasting 4 beats with a max rate of 197 bpm, the  longest lasting 13 beats with an avg rate of 127 bpm. Atrial Fibrillation occurred (<1% burden), ranging from 74-166 bpm (avg of  112 bpm), the longest lasting 28 mins 17 secs with an avg rate of 113 bpm. Supraventricular  Tachycardia and Atrial  Fibrillation were detected within +/- 45 seconds of symptomatic patient event(s). __________   2D echo 12/20/2019: 1. Left ventricular ejection fraction, by estimation, is 60 to 65%. The  left ventricle has normal function. The left ventricle has no regional  wall motion abnormalities. Left ventricular diastolic parameters were  normal.   2. Right ventricular systolic function is normal. The right ventricular  size is normal.   3. The mitral valve is normal in structure. Trivial mitral valve  regurgitation.   4. The aortic valve is normal in structure. Aortic valve regurgitation is not visualized.   EKG:  EKG is ordered today.  The EKG ordered today demonstrates NSR, 85 bpm, nonspecific ST-T changes  Recent Labs: 07/11/2021: BUN 16; Creatinine, Ser 0.85; Hemoglobin 11.1; Platelets 573; Potassium 4.8; Sodium 141  Recent Lipid Panel    Component Value Date/Time   CHOL 204 (H) 12/20/2019 0502   TRIG 510 (H) 12/20/2019 0502   HDL 39 (L) 12/20/2019 0502   CHOLHDL 5.2 12/20/2019 0502   VLDL UNABLE TO CALCULATE IF TRIGLYCERIDE OVER 400 mg/dL 12/20/2019 0502   LDLCALC UNABLE TO CALCULATE IF TRIGLYCERIDE OVER 400 mg/dL 12/20/2019 0502   LDLDIRECT 66.8 12/20/2019 0502    PHYSICAL EXAM:    VS:  BP 100/70 (BP Location: Left Arm, Patient Position: Sitting, Cuff Size: Large)   Pulse 85   Ht '5\' 6"'$  (1.676 m)   Wt 207 lb 6 oz (94.1 kg)   LMP 02/13/2015   SpO2 98%   BMI 33.47 kg/m   BMI: Body mass index is 33.47 kg/m.  Physical Exam Vitals reviewed.  Constitutional:      Appearance: She is well-developed.  HENT:     Head: Normocephalic and atraumatic.  Eyes:     General:        Right eye: No discharge.        Left eye: No discharge.  Neck:     Vascular: No JVD.  Cardiovascular:     Rate and Rhythm: Normal rate and regular rhythm.     Pulses:          Posterior tibial pulses are 2+ on the right side and 2+ on the left side.     Heart sounds: Normal heart  sounds, S1 normal and S2 normal. Heart sounds not distant. No midsystolic click and no opening snap. No murmur heard.    No friction rub.  Pulmonary:     Effort: Pulmonary effort is normal. No respiratory distress.     Breath sounds: Normal breath sounds. No decreased breath sounds, wheezing or rales.  Chest:     Chest wall: No tenderness.  Abdominal:     General: There is no distension.     Palpations: Abdomen is soft.     Tenderness: There is no abdominal tenderness.  Musculoskeletal:     Cervical back: Normal range of motion.     Right lower leg: No edema.     Left lower leg: No edema.  Skin:    General: Skin is warm and dry.     Nails: There is no clubbing.  Neurological:     Mental Status: She is alert and oriented to person, place, and time.  Psychiatric:        Speech: Speech normal.        Behavior: Behavior normal.        Thought  Content: Thought content normal.        Judgment: Judgment normal.     Wt Readings from Last 3 Encounters:  06/15/22 207 lb 6 oz (94.1 kg)  06/09/22 209 lb (94.8 kg)  04/23/22 215 lb (97.5 kg)     ASSESSMENT & PLAN:   Persistent A-fib with recurrence of palpitations: Maintaining sinus rhythm.  Status post A-fib ablation on 07/31/2021.  Per EP, given no evidence of recurrence of A-fib, flecainide was discontinued in 03/2022.  Following this, she has noted an increase in split-second palpitations.  Symptoms appear to be consistent with PACs or PVCs given brief duration.  Less likely recurrence of A-fib.  Await Zio patch.  She remains on carvedilol 25 mg twice daily.  Given a CHA2DS2-VASc of at least 2, she is to remain on apixaban indefinitely.  See below for recommendations regarding anticoagulation management in the perioperative timeframe.  Paroxysmal SVT: Remains on carvedilol as outlined above.  Zio patch pending.  HTN: Blood pressure is well-controlled in the office today.  She remains on amlodipine and carvedilol.  HLD: LDL 66 in 11/2019.   Calcium score 0 by coronary CTA in 07/2021.  Preoperative cardiac risk stratification: She is scheduled to undergo a laparoscopic-assisted vaginal hysterectomy versus D&C on 07/27/2022.  Per Duke Activity Status Index, she can achieve > 4 METs without cardiac limitation.  Per Revised Cardiac Risk Index, she is low risk for noncardiac surgery with an estimated rate of 0.9% for adverse cardiac event in the perioperative timeframe.  Per review of our pharmacy team, she may hold apixaban for 2 to 3 days prior to procedure with recommendation to resume anticoagulation as soon as safely possible at the discretion of her surgical team in an effort to minimize interruption and reduce CVA risk.  Await Zio patch review prior to providing formal cardiac risk stratification.  However, assuming this is reassuring, she would be able to proceed with noncardiac surgery at an overall low risk without further cardiac testing or intervention.   Disposition: F/u with Dr. [name] or an APP in 6 months and EP as directed.   Medication Adjustments/Labs and Tests Ordered: Current medicines are reviewed at length with the patient today.  Concerns regarding medicines are outlined above. Medication changes, Labs and Tests ordered today are summarized above and listed in the Patient Instructions accessible in Encounters.   SignedChristell Faith, PA-C 06/15/2022 11:38 AM     [facility] [address] [place] [address] [place], [place] [address] [phone]

## 2022-06-15 ENCOUNTER — Encounter: Payer: Self-pay | Admitting: Physician Assistant

## 2022-06-15 ENCOUNTER — Ambulatory Visit: Payer: 59 | Attending: Physician Assistant | Admitting: Physician Assistant

## 2022-06-15 VITALS — BP 100/70 | HR 85 | Ht 66.0 in | Wt 207.4 lb

## 2022-06-15 DIAGNOSIS — E7849 Other hyperlipidemia: Secondary | ICD-10-CM

## 2022-06-15 DIAGNOSIS — Z01810 Encounter for preprocedural cardiovascular examination: Secondary | ICD-10-CM | POA: Diagnosis not present

## 2022-06-15 DIAGNOSIS — I10 Essential (primary) hypertension: Secondary | ICD-10-CM

## 2022-06-15 DIAGNOSIS — I4819 Other persistent atrial fibrillation: Secondary | ICD-10-CM | POA: Diagnosis not present

## 2022-06-15 DIAGNOSIS — I471 Supraventricular tachycardia, unspecified: Secondary | ICD-10-CM

## 2022-06-15 NOTE — Patient Instructions (Signed)
Medication Instructions:  May hold Eliquis 2 days prior to your procedure.   *If you need a refill on your cardiac medications before your next appointment, please call your pharmacy*   Lab Work: None  If you have labs (blood work) drawn today and your tests are completely normal, you will receive your results only by: [place] (if you have MyChart) OR A paper copy in the mail If you have any lab test that is abnormal or we need to change your treatment, we will call you to review the results.   Testing/Procedures: None   Follow-Up: At [facility], you and your health needs are our priority.  As part of our continuing mission to provide you with exceptional heart care, we have created designated Provider Care Teams.  These Care Teams include your primary Cardiologist (physician) and Advanced Practice Providers (APPs -  Physician Assistants and Nurse Practitioners) who all work together to provide you with the care you need, when you need it.   Your next appointment:   6 month(s)  Provider:   Kate Sable, MD or [name], PA-C

## 2022-06-17 DIAGNOSIS — Z124 Encounter for screening for malignant neoplasm of cervix: Secondary | ICD-10-CM | POA: Diagnosis not present

## 2022-06-17 DIAGNOSIS — Z6832 Body mass index (BMI) 32.0-32.9, adult: Secondary | ICD-10-CM | POA: Diagnosis not present

## 2022-06-17 DIAGNOSIS — Z01419 Encounter for gynecological examination (general) (routine) without abnormal findings: Secondary | ICD-10-CM | POA: Diagnosis not present

## 2022-06-19 NOTE — Telephone Encounter (Signed)
I will fax over [place] to surgeon office to see notes from [name], [facility].

## 2022-06-19 NOTE — Telephone Encounter (Signed)
Correct, we need to see what her monitor shows prior to providing formal cardiac risk stratification.  This was explained to her at her recent visit with me and she is agreeable with this plan.  Please notify the requesting office we will be in touch with risk stratification once monitor is resulted.

## 2022-06-19 NOTE — Telephone Encounter (Signed)
I will forward to [name], [facility] for review. It looks to be that the pt is wearing a heart monitor. Will need monitor results before the pt is cleared. I will confirm this with [name], [facility] who saw the pt.

## 2022-06-19 NOTE — Telephone Encounter (Signed)
Calling for update. Patient had appt on 1/22

## 2022-06-22 ENCOUNTER — Telehealth: Payer: Self-pay | Admitting: Physician Assistant

## 2022-06-22 NOTE — Telephone Encounter (Signed)
Pt c/o medication issue:  1. Name of Medication: Losartan,  amLODipine (NORVASC) 5 MG tablet   2. How are you currently taking this medication (dosage and times per day)?   3. Are you having a reaction (difficulty breathing--STAT)?   4. What is your medication issue? Pt states she had her labs drawn by PCP office. She states labs showed she had a lot of protein function in her urine from high pressure in her kidney. She states they want to start her back on losartan '50mg'$ , however she states she cannot be on amlodipine and losartan at the same time. Pt wants to know if this is okay with Dunn, PA since he changed her from losartan to amlodipine.

## 2022-06-23 NOTE — Telephone Encounter (Signed)
I am okay with PCP placing the patient back on losartan 50 mg with discontinuation of amlodipine in an effort to minimize significant hypotension.  However, she will require close monitoring of BP.

## 2022-06-23 NOTE — Telephone Encounter (Signed)
Spoke with patient and reviewed provider review and recommendations. She requested that I please call her pharmacy to stop the amlodipine. She then verbalized understanding with no further questions at this time.   Called her pharmacy and they have updated their system.

## 2022-06-24 ENCOUNTER — Ambulatory Visit: Payer: 59 | Admitting: Physician Assistant

## 2022-06-24 NOTE — Telephone Encounter (Signed)
Calling back to say they need the form fill out and fax to them. They stated they received the office notes. Please advise

## 2022-06-24 NOTE — Telephone Encounter (Signed)
I have attempted to return the office call but they office was closed and will try to reach them tomorrow when they open.

## 2022-06-25 NOTE — Telephone Encounter (Signed)
[facility] has had our pre op team in place for 5+ yrs. Our protocol is: we receive clearance request, ALL information is gathered the form from the surgeon office. Information is then put into a format for our pre op team and cardiologist to use. This format helps keep things all uniformed and consistent. We no longer hold on the forms from the requesting office as we are very through with implementing the information correctly to be reviewed by the pre op team and the cardiologist. I will send this note to [name], [place] as FYI.

## 2022-06-29 ENCOUNTER — Emergency Department
Admission: EM | Admit: 2022-06-29 | Discharge: 2022-06-29 | Disposition: A | Discharge status: Home / Self Care | Payer: 59 | Attending: Emergency Medicine | Admitting: Emergency Medicine

## 2022-06-29 ENCOUNTER — Other Ambulatory Visit: Payer: Self-pay

## 2022-06-29 ENCOUNTER — Emergency Department: Payer: 59

## 2022-06-29 DIAGNOSIS — R0789 Other chest pain: Secondary | ICD-10-CM | POA: Diagnosis not present

## 2022-06-29 DIAGNOSIS — R079 Chest pain, unspecified: Secondary | ICD-10-CM | POA: Diagnosis not present

## 2022-06-29 DIAGNOSIS — K219 Gastro-esophageal reflux disease without esophagitis: Secondary | ICD-10-CM | POA: Diagnosis not present

## 2022-06-29 LAB — BASIC METABOLIC PANEL
Anion gap: 9 (ref 5–15)
BUN: 17 mg/dL (ref 6–20)
CO2: 22 mmol/L (ref 22–32)
Calcium: 9.4 mg/dL (ref 8.9–10.3)
Chloride: 102 mmol/L (ref 98–111)
Creatinine, Ser: 0.77 mg/dL (ref 0.44–1.00)
GFR, Estimated: >60 mL/min (ref >60)
Glucose, Bld: 112 mg/dL — ABNORMAL HIGH (ref 70–99)
Potassium: 3.5 mmol/L (ref 3.5–5.1)
Sodium: 133 mmol/L — ABNORMAL LOW (ref 135–145)

## 2022-06-29 LAB — CBC
HCT: 40.8 % (ref 36.0–46.0)
Hemoglobin: 13.7 g/dL (ref 12.0–15.0)
MCH: 29.9 pg (ref 26.0–34.0)
MCHC: 33.6 g/dL (ref 30.0–36.0)
MCV: 89.1 fL (ref 80.0–100.0)
Platelets: 549 10*3/uL — ABNORMAL HIGH (ref 150–400)
RBC: 4.58 MIL/uL (ref 3.87–5.11)
RDW: 12.8 % (ref 11.5–15.5)
WBC: 10.3 10*3/uL (ref 4.0–10.5)
nRBC: 0 % (ref 0.0–0.2)

## 2022-06-29 LAB — TROPONIN I (HIGH SENSITIVITY): Troponin I (High Sensitivity): <2 ng/L (ref <18)

## 2022-06-29 NOTE — Discharge Instructions (Addendum)
You may use calcium carbonate (Tums) for any continued GERD related symptoms

## 2022-06-29 NOTE — ED Provider Notes (Signed)
[facility] Provider Note   Event Date/Time   First MD Initiated Contact with Patient 06/29/22 1751     (approximate) History  Chest Pain (Patient reports intermittent LEFT sided chest pain that began today; She does have a cardiac history including ablation for A-fib RVR; She is also reporting a globus sensation that she says typically goes away after drinking water but it has not helped today)  HPI [name] Ross is a 57 y.o. female with a stated past medical history of atrial fibrillation status post ablation who presents for left-sided chest pain that has been intermittent over the last few days but worse today and has not improved.  Patient describes this as a burning/squeezing chest pain that is central and she states it feels somewhat similar to her GERD pain that she has had in the past.  Patient states that she is also having a sensation like something is stuck in her throat that normally resolves with drinking water however this time it has not.  Patient states that this is also a sign that she has had of GERD in the past. ROS: Patient currently denies any vision changes, tinnitus, difficulty speaking, facial droop, sore throat, shortness of breath, abdominal pain, nausea/vomiting/diarrhea, dysuria, or weakness/numbness/paresthesias in any extremity   Physical Exam  Triage Vital Signs: ED Triage Vitals  Enc Vitals Group     BP 06/29/22 1550 (!) 135/93     Pulse Rate 06/29/22 1550 92     Resp 06/29/22 1550 18     Temp 06/29/22 1552 97.8 F (36.6 C)     Temp src --      SpO2 06/29/22 1550 98 %     Weight 06/29/22 1549 202 lb (91.6 kg)     Height 06/29/22 1549 '5\' 6"'$  (1.676 m)     Head Circumference --      Peak Flow --      Pain Score 06/29/22 1549 5     Pain Loc --      Pain Edu? --      Excl. in [place]? --    Most recent vital signs: Vitals:   06/29/22 1550 06/29/22 1552  BP: (!) 135/93   Pulse: 92   Resp: 18   Temp:  97.8 F (36.6 C)  SpO2: 98%     General: Awake, oriented x4. CV:  Good peripheral perfusion.  Resp:  Normal effort.  Abd:  No distention.  Other:  Old overweight middle-aged Caucasian female laying in bed.  No acute distress ED Results / Procedures / Treatments  Labs (all labs ordered are listed, but only abnormal results are displayed) Labs Reviewed  BASIC METABOLIC PANEL - Abnormal; Notable for the following components:      Result Value   Sodium 133 (*)    Glucose, Bld 112 (*)    All other components within normal limits  CBC - Abnormal; Notable for the following components:   Platelets 549 (*)    All other components within normal limits  TROPONIN I (HIGH SENSITIVITY)   EKG ED ECG REPORT I, Naaman Plummer, the attending physician, personally viewed and interpreted this ECG. Date: 06/29/2022 EKG Time: 1548 Rate: 91 Rhythm: normal sinus rhythm QRS Axis: normal Intervals: normal ST/T Wave abnormalities: normal Narrative Interpretation: no evidence of acute ischemia RADIOLOGY ED MD interpretation: 2 view chest x-Polhemus interpreted by me shows no evidence of acute abnormalities including no pneumonia, pneumothorax, or widened mediastinum -Agree with radiology assessment Official radiology report(s):  DG Chest 2 View  Result Date: 06/29/2022 CLINICAL DATA:  Intermittent left-sided chest pain beginning today. EXAM: CHEST - 2 VIEW COMPARISON:  AP chest 04/05/2021 FINDINGS: Cardiac silhouette and mediastinal contours are within normal limits. Mild calcification within the aortic arch. The lungs are clear. No pulmonary edema, pleural effusion, or pneumothorax. Mild multilevel degenerative disc changes of the thoracic spine. Cholecystectomy clips. IMPRESSION: No active cardiopulmonary disease. Electronically Signed   By: Yvonne Kendall M.D.   On: 06/29/2022 16:29   PROCEDURES: Critical Care performed: No .1-3 Lead EKG Interpretation  Performed by: Naaman Plummer, MD Authorized by: Naaman Plummer, MD      Interpretation: normal     ECG rate:  71   ECG rate assessment: normal     Rhythm: sinus rhythm     Ectopy: none     Conduction: normal    MEDICATIONS ORDERED IN ED: Medications - No data to display IMPRESSION / MDM / [place] / ED COURSE  I reviewed the triage vital signs and the nursing notes.                             The patient is on the cardiac monitor to evaluate for evidence of arrhythmia and/or significant heart rate changes. Patient's presentation is most consistent with acute presentation with potential threat to life or bodily function. 57 year old female with above-stated past medical history presents for chest pain and globus sensation Workup: ECG, CXR, CBC, BMP, Troponin Findings: ECG: No overt evidence of STEMI. No evidence of Brugadas sign, delta wave, epsilon wave, significantly prolonged QTc, or malignant arrhythmia HS Troponin: Negative x1 Other Labs unremarkable for emergent problems. CXR: Without PTX, PNA, or widened mediastinum Last Stress Test:  08/2020 Last coronary CT CT:  07/2021 HEART Score: 4  Given History, Exam, and Workup I have low suspicion for ACS, Pneumothorax, Pneumonia, Pulmonary Embolus, Tamponade, Aortic Dissection or other emergent problem as a cause for this presentation.   Reassesment: Prior to discharge patients pain was controlled and they were well appearing.  Disposition:  Discharge. Strict return precautions discussed with patient with full understanding. Advised patient to follow up promptly with primary care provider    FINAL CLINICAL IMPRESSION(S) / ED DIAGNOSES   Final diagnoses:  Chest pain, unspecified type  Gastroesophageal reflux disease, unspecified whether esophagitis present   Rx / DC Orders   ED Discharge Orders     None      Note:  This document was prepared using Dragon voice recognition software and may include unintentional dictation errors.   Naaman Plummer, MD 06/29/22 [phone]

## 2022-06-29 NOTE — ED Triage Notes (Signed)
Patient reports intermittent LEFT sided chest pain that began today; She does have a cardiac history including ablation for A-fib RVR; She is also reporting a globus sensation that she says typically goes away after drinking water but it has not helped today

## 2022-06-30 DIAGNOSIS — R002 Palpitations: Secondary | ICD-10-CM | POA: Diagnosis not present

## 2022-07-01 ENCOUNTER — Telehealth: Payer: Self-pay | Admitting: Cardiology

## 2022-07-01 NOTE — Telephone Encounter (Signed)
   Name: [name] Ross  DOB: [birth year]  MRN: [id number]  Primary Cardiologist: Kate Sable, MD  Chart reviewed as part of pre-operative protocol coverage. Because of [name] Ross's past medical history and time since last visit, she will require a follow-up in-office visit in order to better assess preoperative cardiovascular risk.  Patient was seen in the ED on 06/29/2022 for chest pain.  Pre-op covering staff: - Please schedule appointment and call patient to inform them. If patient already had an upcoming appointment within acceptable timeframe, please add "pre-op clearance" to the appointment notes so provider is aware. - Please contact requesting surgeon's office via preferred method (i.e, phone, fax) to inform them of need for appointment prior to surgery.  Lenna Sciara, NP  07/01/2022, 11:43 AM

## 2022-07-01 NOTE — Telephone Encounter (Signed)
Left message to call back to schedule in office appt for pre op clearance.

## 2022-07-01 NOTE — Telephone Encounter (Signed)
   Pre-operative Risk Assessment    Patient Name: [name] Ross  DOB: Sep 01, 1965 MRN: [id number]{      Request for Surgical Clearance    Procedure:   LAVH, BSO  Date of Surgery:  Clearance 07/27/22                               Surgeon:  DR DAVID Corinna Capra Surgeon's Group or Practice Name:  [facility] Phone number:  [phone] Fax number:  [phone]  Type of Clearance Requested:   - Medical    Type of Anesthesia:  General    Additional requests/questions:    SignedEli Phillips   07/01/2022, 10:51 AM

## 2022-07-02 ENCOUNTER — Other Ambulatory Visit (HOSPITAL_COMMUNITY): Payer: Self-pay

## 2022-07-02 NOTE — Telephone Encounter (Signed)
   We previously received request for preoperative cardiac risk stratification for laparoscopic assisted vaginal hysterectomy with general anesthesia to be performed by Dr. Corinna Capra.  As you are aware, Ms. [name] Ross is a 57 y.o. female with history of normal coronaries by coronary CTA in 07/2021, PAF status post ablation in 07/2021 on apixaban, SVT, HTN, HLD, obesity, OSA on CPAP, and GERD.  She was seen last in our office on 06/15/2022 for preoperative cardiac risk stratification.  At that time, she was without symptoms of angina or cardiac decompensation.  Her chart had previously been reviewed by our pharmacy team with recommendation for the patient to hold apixaban 2 to 3 days prior to procedure, without bridging, and recommendation to resume anticoagulation as soon as safely possible in the postoperative timeframe at the discretion of her surgical team.  At her visit, she reported palpitations with subsequent Zio patch monitoring showing a predominant rhythm of sinus with a 4 beat run of SVT and rare PVCs.  No evidence of sustained arrhythmia, prolonged pauses, or evidence of high-grade AV block.  She has been able to achieve greater than 4 METs per Duke Activity Status Index without cardiac limitation.  Per Revised Cardiac Risk Index, she is low risk for noncardiac surgery with an estimated rate of 0.9% for adverse cardiac event in the perioperative timeframe.  Recommendations: -She may proceed with noncardiac surgery at an overall low risk without further cardiac testing -Apixaban may be held for 2 to 3 days prior to procedure, without bridging, with recommendation to resume anticoagulation as soon as safely possible in the postoperative timeframe at the discretion of her surgical team   [name], PA-C 07/02/2022 12:42 PM  [place], [place] [address] [phone]

## 2022-07-02 NOTE — Telephone Encounter (Signed)
I s/w the pt and she tells me that she just saw [name], [facility] 06/15/22 which she states was for her pre op clearance. She tells me that she has been wearing a heart monitor and that PAC was waiting to see the results before clearing her for her surgery. I assured the pt that I will reach out to [facility] for further recommendations in regard to clearance.  I assured the pt that once she has been cleared we will fax notes to the surgeon and inform her that she has been cleared. Pt thanked me for the call today and the help.

## 2022-07-02 NOTE — Telephone Encounter (Signed)
Zio patch has resulted and demonstrated a predominant rhythm of sinus with 1 run of SVT lasting just 4 beats with a maximum rate of 197 along with rare atrial and ventricular ectopy.  No sustained arrhythmias, prolonged pauses, or evidence of high-grade AV block.  Patient triggered events corresponded with sinus rhythm and sinus rhythm with PVCs.  Overall, reassuring outpatient cardiac monitor.  From a cardiac perspective, she may proceed with noncardiac surgery at an overall low risk.  I will document this and forward recommendation to the patient's surgical team.

## 2022-07-03 ENCOUNTER — Other Ambulatory Visit (HOSPITAL_COMMUNITY): Payer: Self-pay

## 2022-07-03 DIAGNOSIS — I480 Paroxysmal atrial fibrillation: Secondary | ICD-10-CM | POA: Diagnosis not present

## 2022-07-03 DIAGNOSIS — R0789 Other chest pain: Secondary | ICD-10-CM | POA: Diagnosis not present

## 2022-07-03 DIAGNOSIS — K219 Gastro-esophageal reflux disease without esophagitis: Secondary | ICD-10-CM | POA: Diagnosis not present

## 2022-07-03 MED ORDER — FAMOTIDINE 40 MG PO TABS
40.0000 mg | ORAL_TABLET | Freq: Two times a day (BID) | ORAL | 11 refills | Status: DC
  Filled 2022-07-03: qty 60, 30d supply, fill #0
  Filled 2022-07-28 – 2022-07-31 (×2): qty 60, 30d supply, fill #1

## 2022-07-06 ENCOUNTER — Telehealth: Payer: Self-pay | Admitting: Physician Assistant

## 2022-07-06 NOTE — Telephone Encounter (Signed)
Please see phone note on 07/01/2022 for results.

## 2022-07-06 NOTE — Telephone Encounter (Signed)
Follow Up:    Patient said she was still waiting for her monitor results please.

## 2022-07-07 ENCOUNTER — Ambulatory Visit: Payer: 59 | Admitting: Neurology

## 2022-07-07 ENCOUNTER — Telehealth: Payer: Self-pay | Admitting: Physician Assistant

## 2022-07-07 NOTE — Telephone Encounter (Signed)
Heart monitor was benign and would not recommend any additional medication changes for rare PACs and PVCs.

## 2022-07-07 NOTE — Telephone Encounter (Signed)
I spoke with the patient regarding her monitor results.  She questioned if what she was feeling was due to her stopping her flecainide 04/23/22 as per Thurmond Butts, PA's instructions. She is s/p A-fib ablation 07/31/21.  I advised symptoms may be more noticeable off flecainide with her PAC's/ PVC's, but assured her that these findings are benign.  The patient states she just "notices" these premature beats ~ 10 x/ day, but that are fleeting and last only seconds.   She confirms she is taking coreg 25 mg- 2 tablets (50 mg) BID.  I have advised her I will notify Thurmond Butts, [place] of what she is feeling, but I am unsure if he would recommend any additional changes at this time.  She is aware we will call her back once PA recommendations are received.   The patient voices understanding and is agreeable.  She advised we can send her a response back through her MyChart.

## 2022-07-07 NOTE — Telephone Encounter (Signed)
Sent patient a MyChart message with the results as requested. Message sent to patient was as follows:  Heart monitor was benign and would not recommend any additional medication changes for rare PACs and PVCs.

## 2022-07-07 NOTE — Telephone Encounter (Signed)
[name] Mu, PA-C 07/06/2022  4:13 PM EST     Heart monitor showed sinus rhythm with 1 run of NSVT lasting just 4 beats with rare atrial and ventricular early beats. Patient triggered events were associated with PACs and PVCs. Clearance has already been faxed to her surgeon.

## 2022-07-20 DIAGNOSIS — N840 Polyp of corpus uteri: Secondary | ICD-10-CM | POA: Diagnosis not present

## 2022-07-20 DIAGNOSIS — N950 Postmenopausal bleeding: Secondary | ICD-10-CM | POA: Diagnosis not present

## 2022-07-27 ENCOUNTER — Other Ambulatory Visit: Payer: Self-pay | Admitting: Obstetrics and Gynecology

## 2022-07-27 DIAGNOSIS — D259 Leiomyoma of uterus, unspecified: Secondary | ICD-10-CM | POA: Diagnosis not present

## 2022-07-27 DIAGNOSIS — N938 Other specified abnormal uterine and vaginal bleeding: Secondary | ICD-10-CM | POA: Diagnosis not present

## 2022-07-27 DIAGNOSIS — N840 Polyp of corpus uteri: Secondary | ICD-10-CM | POA: Diagnosis not present

## 2022-07-27 DIAGNOSIS — N950 Postmenopausal bleeding: Secondary | ICD-10-CM | POA: Diagnosis not present

## 2022-07-27 DIAGNOSIS — N939 Abnormal uterine and vaginal bleeding, unspecified: Secondary | ICD-10-CM | POA: Diagnosis not present

## 2022-07-27 DIAGNOSIS — N8003 Adenomyosis of the uterus: Secondary | ICD-10-CM | POA: Diagnosis not present

## 2022-07-27 DIAGNOSIS — N736 Female pelvic peritoneal adhesions (postinfective): Secondary | ICD-10-CM | POA: Diagnosis not present

## 2022-07-29 ENCOUNTER — Other Ambulatory Visit: Payer: Self-pay

## 2022-07-30 ENCOUNTER — Other Ambulatory Visit (HOSPITAL_COMMUNITY): Payer: Self-pay

## 2022-07-30 ENCOUNTER — Encounter (HOSPITAL_COMMUNITY): Payer: Self-pay

## 2022-07-31 ENCOUNTER — Other Ambulatory Visit: Payer: Self-pay

## 2022-07-31 ENCOUNTER — Other Ambulatory Visit (HOSPITAL_COMMUNITY): Payer: Self-pay

## 2022-08-10 DIAGNOSIS — Z1231 Encounter for screening mammogram for malignant neoplasm of breast: Secondary | ICD-10-CM | POA: Diagnosis not present

## 2022-08-11 DIAGNOSIS — L309 Dermatitis, unspecified: Secondary | ICD-10-CM | POA: Diagnosis not present

## 2022-08-14 ENCOUNTER — Other Ambulatory Visit: Payer: Self-pay | Admitting: Obstetrics and Gynecology

## 2022-08-14 DIAGNOSIS — R928 Other abnormal and inconclusive findings on diagnostic imaging of breast: Secondary | ICD-10-CM

## 2022-08-20 ENCOUNTER — Other Ambulatory Visit: Payer: Self-pay | Admitting: Obstetrics and Gynecology

## 2022-08-20 ENCOUNTER — Ambulatory Visit
Admission: RE | Admit: 2022-08-20 | Discharge: 2022-08-20 | Disposition: A | Discharge status: Home / Self Care | Payer: 59 | Source: Ambulatory Visit | Attending: Obstetrics and Gynecology | Admitting: Obstetrics and Gynecology

## 2022-08-20 DIAGNOSIS — N6313 Unspecified lump in the right breast, lower outer quadrant: Secondary | ICD-10-CM | POA: Diagnosis not present

## 2022-08-20 DIAGNOSIS — R928 Other abnormal and inconclusive findings on diagnostic imaging of breast: Secondary | ICD-10-CM

## 2022-08-20 DIAGNOSIS — N6310 Unspecified lump in the right breast, unspecified quadrant: Secondary | ICD-10-CM

## 2022-08-25 ENCOUNTER — Ambulatory Visit
Admission: RE | Admit: 2022-08-25 | Discharge: 2022-08-25 | Disposition: A | Discharge status: Home / Self Care | Payer: 59 | Source: Ambulatory Visit | Attending: Obstetrics and Gynecology | Admitting: Obstetrics and Gynecology

## 2022-08-25 DIAGNOSIS — N6310 Unspecified lump in the right breast, unspecified quadrant: Secondary | ICD-10-CM

## 2022-08-25 DIAGNOSIS — R928 Other abnormal and inconclusive findings on diagnostic imaging of breast: Secondary | ICD-10-CM

## 2022-08-25 DIAGNOSIS — N6313 Unspecified lump in the right breast, lower outer quadrant: Secondary | ICD-10-CM | POA: Diagnosis not present

## 2022-08-25 HISTORY — PX: BREAST BIOPSY: SHX20

## 2022-08-27 ENCOUNTER — Encounter: Payer: Self-pay | Admitting: Neurology

## 2022-09-03 MED ORDER — PRAMIPEXOLE DIHYDROCHLORIDE 0.5 MG PO TABS: 0.5000 mg | ORAL_TABLET | Freq: Every day | ORAL | 5 refills | Status: DC

## 2022-09-07 ENCOUNTER — Other Ambulatory Visit: Payer: Self-pay

## 2022-09-07 ENCOUNTER — Other Ambulatory Visit: Payer: Self-pay | Admitting: *Deleted

## 2022-09-07 DIAGNOSIS — I480 Paroxysmal atrial fibrillation: Secondary | ICD-10-CM

## 2022-09-07 DIAGNOSIS — I10 Essential (primary) hypertension: Secondary | ICD-10-CM

## 2022-09-07 DIAGNOSIS — G4733 Obstructive sleep apnea (adult) (pediatric): Secondary | ICD-10-CM | POA: Diagnosis not present

## 2022-09-07 MED ORDER — APIXABAN 5 MG PO TABS: 5.0000 mg | ORAL_TABLET | Freq: Two times a day (BID) | ORAL | 5 refills | Status: DC

## 2022-09-07 MED ORDER — CARVEDILOL 25 MG PO TABS: ORAL_TABLET | ORAL | 1 refills | Status: DC

## 2022-09-07 NOTE — Telephone Encounter (Signed)
Eliquis 5mg  refill request received. Patient is 57 years old, weight-91.6kg, Crea-0.77 on 06/29/22, Diagnosis-Afib, and last seen by Eula Listen on 06/15/22. Dose is appropriate based on dosing criteria. Will send in refill to requested pharmacy.

## 2022-09-08 NOTE — Progress Notes (Unsigned)
  Electrophysiology Office Follow up Visit Note:    Date:  09/09/2022   ID:  [name] Ross, DOB 12/27/1965, MRN [id number]  PCP:  Garlan Fillers, MD  [facility] Cardiologist:  Debbe Odea, MD  [facility] Electrophysiologist:  Lanier Prude, MD    Interval History:    [name] Ross is a 57 y.o. female who presents for a follow up visit.   She had an A-fib ablation on July 31, 2021 during which the pulmonary veins, posterior wall and cavotricuspid isthmus were ablated.  She wore recent ZIO monitor which showed no atrial fibrillation.  Symptom triggered episodes corresponded to sinus rhythm with PACs and PVCs.  Today she is doing well.  She does report intermittent skipped beats.  She is caught several of them on her Lourena Simmonds mobile device and it reported a PVC.     Past medical, surgical, social and family history were reviewed.  ROS:   Please see the history of present illness.    All other systems reviewed and are negative.  EKGs/Labs/Other Studies Reviewed:    The following studies were reviewed today:  July 04, 2022 ZIO monitor personally reviewed HR 70 - 197 bpm, average 82 bpm. 1 nonsustained SVT, longest 4 beats. Rare supraventricular and ventricular ectopy. No atrial fibrillation. All symptom trigger episodes correspond to sinus rhythm with PAC or PVC.    EKG:  The ekg ordered today demonstrates sinus rhythm.  No ectopy.   Physical Exam:    VS:  BP 110/86   Pulse 86   Ht  (1.676 m)   Wt 210 lb 12.8 oz (95.6 kg)   LMP 02/13/2015   SpO2 95%   BMI 34.02 kg/m     Wt Readings from Last 3 Encounters:  09/09/22 210 lb 12.8 oz (95.6 kg)  06/29/22 202 lb (91.6 kg)  06/15/22 207 lb 6 oz (94.1 kg)     GEN:  Well nourished, well developed in no acute distress CARDIAC: I did not manage August with caution RRR, no murmurs, rubs, gallops RESPIRATORY:  Clear to auscultation without rales, wheezing or rhonchi       ASSESSMENT:    1.  Persistent atrial fibrillation   2. Palpitations    PLAN:    In order of problems listed above:  #Persistent atrial fibrillation and flutter Doing well after her A-fib and flutter ablation July 31, 2021.  She was previously on flecainide.  She is now taking carvedilol.  She is on Eliquis for stroke prophylaxis.  Recent monitor showed no recurrence of arrhythmia.  Thank you  #PVCs Her symptoms are secondary to intermittent PVCs and potentially PACs.  For now recommend continuing carvedilol.  Follow-up 12 months with APP.      Signed, Steffanie Dunn, MD, [facility], [facility] 09/09/2022 10:07 AM    Electrophysiology [facility]

## 2022-09-09 ENCOUNTER — Encounter: Payer: Self-pay | Admitting: Cardiology

## 2022-09-09 ENCOUNTER — Ambulatory Visit: Payer: 59 | Attending: Cardiology | Admitting: Cardiology

## 2022-09-09 VITALS — BP 110/86 | HR 86 | Ht 66.0 in | Wt 210.8 lb

## 2022-09-09 DIAGNOSIS — I4819 Other persistent atrial fibrillation: Secondary | ICD-10-CM

## 2022-09-09 DIAGNOSIS — R002 Palpitations: Secondary | ICD-10-CM

## 2022-09-09 NOTE — Patient Instructions (Addendum)
Medication Instructions:  Your physician recommends that you continue on your current medications as directed. Please refer to the Current Medication list given to you today.  *If you need a refill on your cardiac medications before your next appointment, please call your pharmacy*  Lab Work: None ordered.  If you have labs (blood work) drawn today and your tests are completely normal, you will receive your results only by: MyChart Message (if you have MyChart) OR A paper copy in the mail If you have any lab test that is abnormal or we need to change your treatment, we will call you to review the results.  Testing/Procedures: None ordered.  Follow-Up: At [facility], you and your health needs are our priority.  As part of our continuing mission to provide you with exceptional heart care, we have created designated Provider Care Teams.  These Care Teams include your primary Cardiologist (physician) and Advanced Practice Providers (APPs -  Physician Assistants and Nurse Practitioners) who all work together to provide you with the care you need, when you need it.   Your next appointment:   1 year(s) follow up appointment with an EP APP, orders per Dr. Steffanie Dunn.   The format for your next appointment:   In Person  Provider:   Steffanie Dunn, MD{or one of the following Advanced Practice Providers on your designated Care Team:

## 2022-10-05 ENCOUNTER — Encounter: Payer: Self-pay | Admitting: *Deleted

## 2022-10-05 NOTE — Progress Notes (Unsigned)
PATIENT: [name] Ross DOB: November 10, 1965  REASON FOR VISIT: follow up HISTORY FROM: patient PRIMARY NEUROLOGIST: Dr. Frances Furbish  Virtual Visit via Video Note  I connected with [name] Ross on 10/06/22 at  1:30 PM EDT by a video enabled telemedicine application located remotely at [facility] and verified that I am speaking with the correct person using two identifiers who was located at their own home.   I discussed the limitations of evaluation and management by telemedicine and the availability of in person appointments. The patient expressed understanding and agreed to proceed.   PATIENT: [name] Ross DOB: Nov 04, 1965  REASON FOR VISIT: follow up HISTORY FROM: patient  HISTORY OF PRESENT ILLNESS: Today 10/06/22:  [name] Ross is a 57 y.o. female with a history of obstructive sleep apnea on CPAP and RLS. Returns today for follow-up.  Reports that the CPAP is working well for her.  Continues to notice the benefit.  Changes out her supplies regularly.  Her download is below.  Reports that she continues on Mirapex 0.5 mg before bedtime.  This continues to work well.  She states that she has been on Ambien for several years.  She does not take it consistently but she no longer feels that is working.  This was originally prescribed by her PCP.  She is wondering if we can switch to something else.  Patient states that she is working on her sleep hygiene.  She does tend to watch TV in bed and look at her phone.  She is trying to break this habit.       REVIEW OF SYSTEMS: Out of a complete 14 system review of symptoms, the patient complains only of the following symptoms, and all other reviewed systems are negative.  ESS 5  ALLERGIES: Allergies  Allergen Reactions   Metformin Hcl     Other reaction(s): bloating Other reaction(s): bloating    Venlafaxine     Sleepy    HOME MEDICATIONS: Outpatient Medications Prior to Visit  Medication Sig Dispense Refill    acetaminophen (TYLENOL) 500 MG tablet Take 1,000 mg by mouth every 6 (six) hours as needed for mild pain.     albuterol (VENTOLIN HFA) 108 (90 Base) MCG/ACT inhaler Inhale 2 puffs into the lungs every 6 (six) hours as needed for wheezing or shortness of breath.     apixaban (ELIQUIS) 5 MG TABS tablet Take 1 tablet (5 mg total) by mouth 2 (two) times daily. 60 tablet 5   Ascorbic Acid (VITAMIN C) 1000 MG tablet Take by mouth daily.     buPROPion (WELLBUTRIN XL) 300 MG 24 hr tablet Take 300 mg by mouth every morning.      butalbital-acetaminophen-caffeine (FIORICET) 50-325-40 MG tablet Take 1 tablet by mouth 2 (two) times daily as needed for headache.     carvedilol (COREG) 25 MG tablet TAKE (2) TABLETS BY MOUTH TWICE DAILY. 120 tablet 1   dicyclomine (BENTYL) 10 MG capsule Take one tablet by mouth once to twice daily as needed 90 capsule 3   escitalopram (LEXAPRO) 20 MG tablet Take 20 mg by mouth every morning.      estradiol (ESTRACE) 2 MG tablet Take 2 mg by mouth daily.     famotidine (PEPCID) 40 MG tablet Take 1 tablet (40 mg total) by mouth 2 (two) times daily for gerd 60 tablet 11   fenofibrate micronized (LOFIBRA) 200 MG capsule Take 200 mg by mouth daily before breakfast.   4  fexofenadine (ALLEGRA) 180 MG tablet      fluticasone (FLONASE) 50 MCG/ACT nasal spray Place 1 spray into both nostrils daily.     ibuprofen (ADVIL) 200 MG tablet Take 600-800 mg by mouth every 6 (six) hours as needed for moderate pain.     lubiprostone (AMITIZA) 24 MCG capsule TAKE ONE CAPSULE BY MOUTH TWICE DAILY WITH FOOD 60 capsule 1   magnesium oxide (MAG-OX) 400 (240 Mg) MG tablet Take 400 mg by mouth daily.     meclizine (ANTIVERT) 25 MG tablet Take 1 tablet (25 mg total) by mouth 3 (three) times daily as needed for dizziness. 30 tablet 0   mometasone (ELOCON) 0.1 % ointment Apply 1 application. topically 2 (two) times daily as needed (rash).     omega-3 acid ethyl esters (LOVAZA) 1 g capsule Take 1 capsule (1  g total) by mouth 2 (two) times daily. 60 capsule 5   omeprazole (PRILOSEC) 40 MG capsule Take 1 capsule (40 mg total) by mouth in the morning and at bedtime. 60 capsule 1   ondansetron (ZOFRAN-ODT) 8 MG disintegrating tablet Take 8 mg by mouth every 8 (eight) hours as needed.     pramipexole (MIRAPEX) 0.5 MG tablet Take 1 tablet (0.5 mg total) by mouth at bedtime. 30 tablet 5   progesterone (PROMETRIUM) 200 MG capsule Take 200 mg by mouth at bedtime. (Patient not taking: Reported on 09/09/2022)     Semaglutide, 2 MG/DOSE, (OZEMPIC, 2 MG/DOSE,) 8 MG/3ML SOPN Inject 2 mg into the skin once a week. 3 mL 11   tacrolimus (PROTOPIC) 0.1 % ointment Apply 1 application. topically 2 (two) times daily as needed (rash).     triamcinolone (KENALOG) 0.1 % Apply 1 application. topically 2 (two) times daily as needed (rash).     triamterene-hydrochlorothiazide (MAXZIDE-25) 37.5-25 MG per tablet Take 0.5 tablets by mouth every morning.      valACYclovir (VALTREX) 1000 MG tablet Take 1,000 mg by mouth 2 (two) times daily as needed (cold sores).     zolpidem (AMBIEN) 10 MG tablet Take 10 mg by mouth at bedtime as needed for sleep.     Facility-Administered Medications Prior to Visit  Medication Dose Route Frequency Provider Last Rate Last Admin   0.9 %  sodium chloride infusion  500 mL Intravenous Once Armbruster, Willaim Rayas, MD        PAST MEDICAL HISTORY: Past Medical History:  Diagnosis Date   Allergy    Anal skin tag    Anemia    Anxiety    Atrial fibrillation (HCC)    Depression    Diabetes mellitus without complication (HCC)    pre DM   Diverticulosis    Diverticulosis, sigmoid    Dyslipidemia    GERD (gastroesophageal reflux disease)    Hemorrhoids    internal and external   History of diverticulitis    10/ 2016   History of galactorrhea    History of stress test    a. Pt reports h/o stress test and echo by cardiologist in [place] (no records in Epic).  Both reportedly nl.   Hyperlipidemia     Hypertension    IBS (irritable bowel syndrome)    Morbid obesity (HCC)    PONV (postoperative nausea and vomiting)    severe   Rash    elbows and behind knees   Restless leg syndrome    Sleep apnea    wears c-pap   Wears glasses     PAST SURGICAL HISTORY: Past  Surgical History:  Procedure Laterality Date   ATRIAL FIBRILLATION ABLATION N/A 07/31/2021   Procedure: ATRIAL FIBRILLATION ABLATION;  Surgeon: Lanier Prude, MD;  Location: [facility];  Service: Cardiovascular;  Laterality: N/A;   BAND HEMORRHOIDECTOMY  2012   Dr Kinnie Scales   BREAST BIOPSY Right 08/25/2022   [place] RT BREAST BX W LOC DEV 1ST LESION IMG BX SPEC US GUIDE 08/25/2022 [facility]   CARDIOVERSION N/A 04/08/2021   Procedure: CARDIOVERSION;  Surgeon: Yvonne Kendall, MD;  Location: [facility];  Service: Cardiovascular;  Laterality: N/A;   CESAREAN SECTION  1994   COLONOSCOPY  06/18/2011   CYSTO/  BILATERAL PYELOGRAM RETROGRADES/  TRANSOBTURATOR SLING  03/08/2006   EXCISION OF SKIN TAG N/A 02/13/2016   Procedure: ANAL SKIN TAG EXCISION;  Surgeon: Romie Levee, MD;  Location: [facility];  Service: General;  Laterality: N/A;   LAPAROSCOPIC CHOLECYSTECTOMY  05/22/2001   SHOULDER SURGERY      FAMILY HISTORY: Family History  Problem Relation Age of Onset   Colon cancer Mother    Diabetes Mother    Hypertension Mother    Atrial fibrillation Mother    Diabetes Father        committed suicide   Skin cancer Father    Suicidality Father    Hypertension Father    Diabetes Sister    Diabetes Sister    Breast cancer Other        materanl great aunt   Sleep apnea Neg Hx    Esophageal cancer Neg Hx    Rectal cancer Neg Hx    Stomach cancer Neg Hx    Restless legs syndrome Neg Hx     SOCIAL HISTORY: Social History   Socioeconomic History   Marital status: Married    Spouse name: Merchant navy officer   Number of children: 2   Years of education: RN   Highest education level: Not on file   Occupational History   Occupation: RN  Tobacco Use   Smoking status: Never   Smokeless tobacco: Never   Tobacco comments:    Never smoke 08/26/21  Vaping Use   Vaping Use: Never used  Substance and Sexual Activity   Alcohol use: No    Alcohol/week: 0.0 standard drinks of alcohol   Drug use: No   Sexual activity: Not on file    Comment: post menopausal per pt  Other Topics Concern   Not on file  Social History Narrative   Lives in [place].  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  Occasionally drinks coffee but not a heavy caffeine drinker.     Social Determinants of Health   Financial Resource Strain: Not on file  Food Insecurity: Not on file  Transportation Needs: Not on file  Physical Activity: Not on file  Stress: Not on file  Social Connections: Not on file  Intimate Partner Violence: Not on file      PHYSICAL EXAM Generalized: Well developed, in no acute distress   Neurological examination  Mentation: Alert oriented to time, place, history taking. Follows all commands speech and language fluent Cranial nerve II-XII: Facial symmetry noted  DIAGNOSTIC DATA (LABS, IMAGING, TESTING) - I reviewed patient records, labs, notes, testing and imaging myself where available.  Lab Results  Component Value Date   WBC 10.3 06/29/2022   HGB 13.7 06/29/2022   HCT 40.8 06/29/2022   MCV 89.1 06/29/2022   PLT 549 (H) 06/29/2022      Component Value Date/Time  NA 133 (L) 06/29/2022 1556   NA 141 07/11/2021 1332   K 3.5 06/29/2022 1556   CL 102 06/29/2022 1556   CO2 22 06/29/2022 1556   GLUCOSE 112 (H) 06/29/2022 1556   BUN 17 06/29/2022 1556   BUN 16 07/11/2021 1332   CREATININE 0.77 06/29/2022 1556   CALCIUM 9.4 06/29/2022 1556   PROT 7.7 04/05/2021 1912   ALBUMIN 3.5 04/05/2021 1912   AST 22 04/05/2021 1912   ALT 14 04/05/2021 1912   ALKPHOS 32 (L) 04/05/2021 1912   BILITOT 0.7 04/05/2021 1912   GFRNONAA >60 06/29/2022 1556   GFRAA >60  12/20/2019 0502   Lab Results  Component Value Date   CHOL 204 (H) 12/20/2019   HDL 39 (L) 12/20/2019   LDLCALC UNABLE TO CALCULATE IF TRIGLYCERIDE OVER 400 mg/dL 16/02/9603   LDLDIRECT 66.8 12/20/2019   TRIG 510 (H) 12/20/2019   CHOLHDL 5.2 12/20/2019   Lab Results  Component Value Date   HGBA1C 5.9 (H) 04/06/2021   No results found for: "VITAMINB12" Lab Results  Component Value Date   TSH 3.374 04/06/2021      ASSESSMENT AND PLAN 57 y.o. year old female  has a past medical history of Allergy, Anal skin tag, Anemia, Anxiety, Atrial fibrillation (HCC), Depression, Diabetes mellitus without complication (HCC), Diverticulosis, Diverticulosis, sigmoid, Dyslipidemia, GERD (gastroesophageal reflux disease), Hemorrhoids, History of diverticulitis, History of galactorrhea, History of stress test, Hyperlipidemia, Hypertension, IBS (irritable bowel syndrome), Morbid obesity (HCC), PONV (postoperative nausea and vomiting), Rash, Restless leg syndrome, Sleep apnea, and Wears glasses. here with:  OSA on CPAP  CPAP compliance excellent Residual AHI is good Encouraged patient to continue using CPAP nightly and > 4 hours each night  Restless leg syndrome Continue Mirapex 0.5 mg at bedtime  Insomnia  Currently on Ambien prescribed by PCP. We discussed sleep hygiene. Advised that I would discuss medication change with Dr. Frances Furbish  F/U in 1 year or sooner if needed    Butch Penny, MSN, NP-C 10/06/2022, 1:25 PM [facility] [address] [place], [place] [address] [phone]

## 2022-10-06 ENCOUNTER — Telehealth: Payer: 59 | Admitting: Adult Health

## 2022-10-06 DIAGNOSIS — G2581 Restless legs syndrome: Secondary | ICD-10-CM | POA: Diagnosis not present

## 2022-10-06 DIAGNOSIS — G4700 Insomnia, unspecified: Secondary | ICD-10-CM

## 2022-10-06 DIAGNOSIS — G4733 Obstructive sleep apnea (adult) (pediatric): Secondary | ICD-10-CM

## 2022-10-07 ENCOUNTER — Encounter: Payer: Self-pay | Admitting: *Deleted

## 2022-11-03 ENCOUNTER — Other Ambulatory Visit: Payer: Self-pay | Admitting: Physician Assistant

## 2022-11-03 DIAGNOSIS — I10 Essential (primary) hypertension: Secondary | ICD-10-CM

## 2022-11-03 DIAGNOSIS — I480 Paroxysmal atrial fibrillation: Secondary | ICD-10-CM

## 2022-11-12 ENCOUNTER — Other Ambulatory Visit: Payer: Self-pay

## 2022-11-12 ENCOUNTER — Other Ambulatory Visit (HOSPITAL_COMMUNITY): Payer: Self-pay

## 2022-11-12 MED ORDER — OZEMPIC (2 MG/DOSE) 8 MG/3ML ~~LOC~~ SOPN
2.0000 mg | PEN_INJECTOR | SUBCUTANEOUS | 11 refills | Status: DC
  Filled 2022-12-23: qty 3, 28d supply, fill #0

## 2022-11-13 ENCOUNTER — Other Ambulatory Visit (HOSPITAL_COMMUNITY): Payer: Self-pay

## 2022-12-15 ENCOUNTER — Other Ambulatory Visit: Payer: Self-pay | Admitting: Physician Assistant

## 2022-12-15 DIAGNOSIS — I10 Essential (primary) hypertension: Secondary | ICD-10-CM

## 2022-12-15 DIAGNOSIS — I480 Paroxysmal atrial fibrillation: Secondary | ICD-10-CM

## 2022-12-15 NOTE — Telephone Encounter (Signed)
Please schedule overdue F/U appointment for further refills. Thank you! 

## 2022-12-16 DIAGNOSIS — E785 Hyperlipidemia, unspecified: Secondary | ICD-10-CM | POA: Diagnosis not present

## 2022-12-16 DIAGNOSIS — R7301 Impaired fasting glucose: Secondary | ICD-10-CM | POA: Diagnosis not present

## 2022-12-16 DIAGNOSIS — G4733 Obstructive sleep apnea (adult) (pediatric): Secondary | ICD-10-CM | POA: Diagnosis not present

## 2022-12-16 DIAGNOSIS — E668 Other obesity: Secondary | ICD-10-CM | POA: Diagnosis not present

## 2022-12-16 DIAGNOSIS — I10 Essential (primary) hypertension: Secondary | ICD-10-CM | POA: Diagnosis not present

## 2022-12-17 DIAGNOSIS — G4733 Obstructive sleep apnea (adult) (pediatric): Secondary | ICD-10-CM | POA: Diagnosis not present

## 2022-12-21 ENCOUNTER — Ambulatory Visit: Payer: 59 | Attending: Cardiology | Admitting: Cardiology

## 2022-12-22 ENCOUNTER — Encounter: Payer: Self-pay | Admitting: Cardiology

## 2022-12-24 ENCOUNTER — Other Ambulatory Visit (HOSPITAL_COMMUNITY): Payer: Self-pay

## 2022-12-24 ENCOUNTER — Encounter (HOSPITAL_COMMUNITY): Payer: Self-pay

## 2022-12-25 ENCOUNTER — Other Ambulatory Visit: Payer: Self-pay

## 2022-12-28 ENCOUNTER — Ambulatory Visit: Payer: 59 | Attending: Cardiology | Admitting: Cardiology

## 2022-12-28 ENCOUNTER — Encounter: Payer: Self-pay | Admitting: Cardiology

## 2022-12-28 VITALS — BP 100/70 | HR 87 | Ht 66.0 in | Wt 222.2 lb

## 2022-12-28 DIAGNOSIS — I480 Paroxysmal atrial fibrillation: Secondary | ICD-10-CM

## 2022-12-28 DIAGNOSIS — I10 Essential (primary) hypertension: Secondary | ICD-10-CM

## 2022-12-28 NOTE — Patient Instructions (Signed)
Medication Instructions:   Your physician recommends that you continue on your current medications as directed. Please refer to the Current Medication list given to you today.  *If you need a refill on your cardiac medications before your next appointment, please call your pharmacy*   Lab Work:  None Ordered  If you have labs (blood work) drawn today and your tests are completely normal, you will receive your results only by: MyChart Message (if you have MyChart) OR A paper copy in the mail If you have any lab test that is abnormal or we need to change your treatment, we will call you to review the results.   Testing/Procedures:  None Ordered    Follow-Up: At [facility], you and your health needs are our priority.  As part of our continuing mission to provide you with exceptional heart care, we have created designated Provider Care Teams.  These Care Teams include your primary Cardiologist (physician) and Advanced Practice Providers (APPs -  Physician Assistants and Nurse Practitioners) who all work together to provide you with the care you need, when you need it.  We recommend signing up for the patient portal called "MyChart".  Sign up information is provided on this After Visit Summary.  MyChart is used to connect with patients for Virtual Visits (Telemedicine).  Patients are able to view lab/test results, encounter notes, upcoming appointments, etc.  Non-urgent messages can be sent to your provider as well.   To learn more about what you can do with MyChart, go to [email or web address].    Your next appointment:   12 month(s)  Provider:   You may see Brian Agbor-Etang, MD or one of the following Advanced Practice Providers on your designated Care Team:   Christopher Berge, NP Ryan Dunn, PA-C Cadence Furth, PA-C Sheri Hammock, NP  

## 2022-12-28 NOTE — Progress Notes (Signed)
Cardiology Office Note:    Date:  12/28/2022   ID:  [name] Ross, DOB Oct 19, 1965, MRN [id number]  PCP:  Garlan Fillers, MD   [facility]  Cardiologist:  Debbe Odea, MD  Advanced Practice Provider:  No care team member to display Electrophysiologist:  Lanier Prude, MD       Referring MD: Garlan Fillers, MD   Chief Complaint  Patient presents with   Follow-up    Patient feels well. Meds reviewed.    History of Present Illness:    [name] Ross is a 57 y.o. female with a hx of paroxysmal atrial fibrillation s/p ablation 07/2021, hypertension, GERD who presents for follow-up.  Has symptoms of palpitations have been adequately controlled ever since her ablation.  Was previously on flecainide, this was stopped.  Currently takes carvedilol and Maxzide for BP control.  Has been feeling lightheaded/dizzy of late.  Systolics have been in the low 100s occasionally.  No bleeding issues with Eliquis.  Prior notes Echo 03/2021 EF 60 to 65% Echocardiogram 11/2019 normal systolic and diastolic function, EF 60 to 65%  Past Medical History:  Diagnosis Date   Allergy    Anal skin tag    Anemia    Anxiety    Atrial fibrillation (HCC)    Depression    Diabetes mellitus without complication (HCC)    pre DM   Diverticulosis    Diverticulosis, sigmoid    Dyslipidemia    GERD (gastroesophageal reflux disease)    Hemorrhoids    internal and external   History of diverticulitis    10/ 2016   History of galactorrhea    History of stress test    a. Pt reports h/o stress test and echo by cardiologist in [place] (no records in Epic).  Both reportedly nl.   Hyperlipidemia    Hypertension    IBS (irritable bowel syndrome)    Morbid obesity (HCC)    PONV (postoperative nausea and vomiting)    severe   Rash    elbows and behind knees   Restless leg syndrome    Sleep apnea    wears c-pap   Wears glasses     Past Surgical History:  Procedure  Laterality Date   ATRIAL FIBRILLATION ABLATION N/A 07/31/2021   Procedure: ATRIAL FIBRILLATION ABLATION;  Surgeon: Lanier Prude, MD;  Location: [facility];  Service: Cardiovascular;  Laterality: N/A;   BAND HEMORRHOIDECTOMY  2012   Dr Kinnie Scales   BREAST BIOPSY Right 08/25/2022   [place] RT BREAST BX W LOC DEV 1ST LESION IMG BX SPEC US GUIDE 08/25/2022 [facility]   CARDIOVERSION N/A 04/08/2021   Procedure: CARDIOVERSION;  Surgeon: Yvonne Kendall, MD;  Location: [facility] ORS;  Service: Cardiovascular;  Laterality: N/A;   CESAREAN SECTION  1994   COLONOSCOPY  06/18/2011   CYSTO/  BILATERAL PYELOGRAM RETROGRADES/  TRANSOBTURATOR SLING  03/08/2006   EXCISION OF SKIN TAG N/A 02/13/2016   Procedure: ANAL SKIN TAG EXCISION;  Surgeon: Romie Levee, MD;  Location: [facility];  Service: General;  Laterality: N/A;   LAPAROSCOPIC CHOLECYSTECTOMY  05/22/2001   SHOULDER SURGERY      Current Medications: Current Meds  Medication Sig   acetaminophen (TYLENOL) 500 MG tablet Take 1,000 mg by mouth every 6 (six) hours as needed for mild pain.   albuterol (VENTOLIN HFA) 108 (90 Base) MCG/ACT inhaler Inhale 2 puffs into the lungs every 6 (six) hours as needed for wheezing  or shortness of breath.   apixaban (ELIQUIS) 5 MG TABS tablet Take 1 tablet (5 mg total) by mouth 2 (two) times daily.   Ascorbic Acid (VITAMIN C) 1000 MG tablet Take by mouth daily.   buPROPion (WELLBUTRIN XL) 300 MG 24 hr tablet Take 300 mg by mouth every morning.    butalbital-acetaminophen-caffeine (FIORICET) 50-325-40 MG tablet Take 1 tablet by mouth 2 (two) times daily as needed for headache.   carvedilol (COREG) 25 MG tablet TAKE TWO TABLETS BY MOUTH TWICE DAILY   dicyclomine (BENTYL) 10 MG capsule Take one tablet by mouth once to twice daily as needed   escitalopram (LEXAPRO) 20 MG tablet Take 20 mg by mouth every morning.    estradiol (ESTRACE) 2 MG tablet Take 2 mg by mouth daily.   fenofibrate micronized  (LOFIBRA) 200 MG capsule Take 200 mg by mouth daily before breakfast.    fexofenadine (ALLEGRA) 180 MG tablet    fluticasone (FLONASE) 50 MCG/ACT nasal spray Place 1 spray into both nostrils daily.   ibuprofen (ADVIL) 200 MG tablet Take 600-800 mg by mouth every 6 (six) hours as needed for moderate pain.   lubiprostone (AMITIZA) 24 MCG capsule TAKE ONE CAPSULE BY MOUTH TWICE DAILY WITH FOOD   magnesium oxide (MAG-OX) 400 (240 Mg) MG tablet Take 400 mg by mouth daily.   meclizine (ANTIVERT) 25 MG tablet Take 1 tablet (25 mg total) by mouth 3 (three) times daily as needed for dizziness.   mometasone (ELOCON) 0.1 % ointment Apply 1 application. topically 2 (two) times daily as needed (rash).   omega-3 acid ethyl esters (LOVAZA) 1 g capsule Take 1 capsule (1 g total) by mouth 2 (two) times daily.   omeprazole (PRILOSEC) 40 MG capsule Take 1 capsule (40 mg total) by mouth in the morning and at bedtime.   ondansetron (ZOFRAN-ODT) 8 MG disintegrating tablet Take 8 mg by mouth every 8 (eight) hours as needed.   pramipexole (MIRAPEX) 0.5 MG tablet Take 1 tablet (0.5 mg total) by mouth at bedtime.   Semaglutide, 2 MG/DOSE, (OZEMPIC, 2 MG/DOSE,) 8 MG/3ML SOPN Inject 2 mg into the skin once a week.   tacrolimus (PROTOPIC) 0.1 % ointment Apply 1 application. topically 2 (two) times daily as needed (rash).   triamcinolone (KENALOG) 0.1 % Apply 1 application. topically 2 (two) times daily as needed (rash).   triamterene-hydrochlorothiazide (MAXZIDE-25) 37.5-25 MG per tablet Take 0.5 tablets by mouth every morning.    valACYclovir (VALTREX) 1000 MG tablet Take 1,000 mg by mouth 2 (two) times daily as needed (cold sores).   zolpidem (AMBIEN) 10 MG tablet Take 10 mg by mouth at bedtime as needed for sleep.   Current Facility-Administered Medications for the 12/28/22 encounter (Office Visit) with Debbe Odea, MD  Medication   0.9 %  sodium chloride infusion     Allergies:   Metformin hcl and Venlafaxine    Social History   Socioeconomic History   Marital status: Married    Spouse name: Loraine Leriche   Number of children: 2   Years of education: RN   Highest education level: Not on file  Occupational History   Occupation: RN  Tobacco Use   Smoking status: Never   Smokeless tobacco: Never   Tobacco comments:    Never smoke 08/26/21  Vaping Use   Vaping status: Never Used  Substance and Sexual Activity   Alcohol use: No    Alcohol/week: 0.0 standard drinks of alcohol   Drug use: No   Sexual  activity: Not on file    Comment: post menopausal per pt  Other Topics Concern   Not on file  Social History Narrative   Lives in [place].  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  Occasionally drinks coffee but not a heavy caffeine drinker.     Social Determinants of Health   Financial Resource Strain: Not on file  Food Insecurity: Not on file  Transportation Needs: Not on file  Physical Activity: Not on file  Stress: Not on file  Social Connections: Not on file     Family History: The patient's family history includes Atrial fibrillation in her mother; Breast cancer in an other family member; Colon cancer in her mother; Diabetes in her father, mother, sister, and sister; Hypertension in her father and mother; Skin cancer in her father; Suicidality in her father. There is no history of Sleep apnea, Esophageal cancer, Rectal cancer, Stomach cancer, or Restless legs syndrome.  ROS:   Please see the history of present illness.     All other systems reviewed and are negative.  EKGs/Labs/Other Studies Reviewed:    The following studies were reviewed today:   EKG Interpretation Date/Time:  Monday December 28 2022 11:16:25 EDT Ventricular Rate:  87 PR Interval:  154 QRS Duration:  78 QT Interval:  402 QTC Calculation: 483 R Axis:   1  Text Interpretation: Normal sinus rhythm Low voltage QRS Prolonged QT Confirmed by Debbe Odea ([id number]) on 12/28/2022 11:24:40 AM     Recent Labs: 06/29/2022: BUN 17; Creatinine, Ser 0.77; Hemoglobin 13.7; Platelets 549; Potassium 3.5; Sodium 133  Recent Lipid Panel    Component Value Date/Time   CHOL 204 (H) 12/20/2019 0502   TRIG 510 (H) 12/20/2019 0502   HDL 39 (L) 12/20/2019 0502   CHOLHDL 5.2 12/20/2019 0502   VLDL UNABLE TO CALCULATE IF TRIGLYCERIDE OVER 400 mg/dL 60/45/4098 1191   LDLCALC UNABLE TO CALCULATE IF TRIGLYCERIDE OVER 400 mg/dL 47/82/9562 1308   LDLDIRECT 66.8 12/20/2019 0502     Risk Assessment/Calculations:      Physical Exam:    VS:  BP 100/70 (BP Location: Left Arm, Patient Position: Sitting, Cuff Size: Normal)   Pulse 87   Ht 5\' 6"  (1.676 m)   Wt 222 lb 3.2 oz (100.8 kg)   LMP 02/13/2015   SpO2 97%   BMI 35.86 kg/m     Wt Readings from Last 3 Encounters:  12/28/22 222 lb 3.2 oz (100.8 kg)  09/09/22 210 lb 12.8 oz (95.6 kg)  06/29/22 202 lb (91.6 kg)     GEN:  Well nourished, well developed in no acute distress HEENT: Normal NECK: No JVD; No carotid bruits CARDIAC: RRR, no murmurs, rubs, gallops RESPIRATORY:  Clear to auscultation without rales, wheezing or rhonchi  ABDOMEN: Soft, non-tender, non-distended MUSCULOSKELETAL:  No edema; No deformity  SKIN: Warm and dry NEUROLOGIC:  Alert and oriented x 3 PSYCHIATRIC:  Normal affect   ASSESSMENT:    1. PAF (paroxysmal atrial fibrillation) (HCC)   2. Primary hypertension    PLAN:    In order of problems listed above:  Paroxysmal atrial fibrillation, s/p ablation 07/2021.  CHA2DS2-VASc score 2.  Denies palpitations.  Continue Eliquis 5 mg twice daily, Coreg 25mg  twice daily last EF 60%.  Hypertension, BP low normal, occasional dizziness.  Stop Maxzide, continue Coreg 25 twice daily.  Follow-up in 1 year.    Medication Adjustments/Labs and Tests Ordered: Current medicines are reviewed at length with the patient today.  Concerns regarding medicines are outlined above.  Orders Placed This Encounter  Procedures   EKG  12-Lead   No orders of the defined types were placed in this encounter.   Patient Instructions  Medication Instructions:   Your physician recommends that you continue on your current medications as directed. Please refer to the Current Medication list given to you today.  *If you need a refill on your cardiac medications before your next appointment, please call your pharmacy*   Lab Work:  None Ordered  If you have labs (blood work) drawn today and your tests are completely normal, you will receive your results only by: MyChart Message (if you have MyChart) OR A paper copy in the mail If you have any lab test that is abnormal or we need to change your treatment, we will call you to review the results.   Testing/Procedures:  None Ordered   Follow-Up: At [facility], you and your health needs are our priority.  As part of our continuing mission to provide you with exceptional heart care, we have created designated Provider Care Teams.  These Care Teams include your primary Cardiologist (physician) and Advanced Practice Providers (APPs -  Physician Assistants and Nurse Practitioners) who all work together to provide you with the care you need, when you need it.  We recommend signing up for the patient portal called "MyChart".  Sign up information is provided on this After Visit Summary.  MyChart is used to connect with patients for Virtual Visits (Telemedicine).  Patients are able to view lab/test results, encounter notes, upcoming appointments, etc.  Non-urgent messages can be sent to your provider as well.   To learn more about what you can do with MyChart, go to [email or web address].    Your next appointment:   12 month(s)  Provider:   You may see Debbe Odea, MD or one of the following Advanced Practice Providers on your designated Care Team:   Nicolasa Ducking, NP Eula Listen, PA-C Cadence Fransico Michael, PA-C Charlsie Quest, NP    Signed, Debbe Odea, MD   12/28/2022 12:10 PM    [facility]

## 2022-12-29 ENCOUNTER — Encounter: Payer: Self-pay | Admitting: Cardiology

## 2023-01-28 ENCOUNTER — Encounter: Payer: Self-pay | Admitting: Neurology

## 2023-01-28 MED ORDER — PRAMIPEXOLE DIHYDROCHLORIDE 0.75 MG PO TABS: 0.7500 mg | ORAL_TABLET | Freq: Every day | ORAL | 7 refills | Status: DC

## 2023-01-28 NOTE — Telephone Encounter (Signed)
Ok to increase to 0.75 mg

## 2023-02-09 DIAGNOSIS — H02831 Dermatochalasis of right upper eyelid: Secondary | ICD-10-CM | POA: Diagnosis not present

## 2023-02-19 ENCOUNTER — Telehealth: Payer: Self-pay | Admitting: Physician Assistant

## 2023-02-19 NOTE — Telephone Encounter (Signed)
Patient with diagnosis of afib on Eliquis for anticoagulation.    Procedure: Bilateral upper eyelid blepharoplasty, endoscopic brow lift  Date of procedure: 03/05/2023   CHA2DS2-VASc Score = 2   This indicates a 2.2% annual risk of stroke. The patient's score is based upon: CHF History: 0 HTN History: 1 Diabetes History: 0 Stroke History: 0 Vascular Disease History: 0 Age Score: 0 Gender Score: 1       CrCl >90 mL/min Platelet count 549 K    Per office protocol, patient can hold Eliquis for 2 days prior to procedure.     **This guidance is not considered finalized until pre-operative APP has relayed final recommendations.**

## 2023-02-19 NOTE — Telephone Encounter (Signed)
     Primary Cardiologist: Debbe Odea, MD  Chart reviewed as part of pre-operative protocol coverage. Given past medical history and time since last visit, based on ACC/AHA guidelines, [name] Ross would be at acceptable risk for the planned procedure without further cardiovascular testing.   Patient with diagnosis of afib on Eliquis for anticoagulation.     Procedure: Bilateral upper eyelid blepharoplasty, endoscopic brow lift  Date of procedure: 03/05/2023     CHA2DS2-VASc Score = 2   This indicates a 2.2% annual risk of stroke. The patient's score is based upon: CHF History: 0 HTN History: 1 Diabetes History: 0 Stroke History: 0 Vascular Disease History: 0 Age Score: 0 Gender Score: 1        CrCl >90 mL/min Platelet count 549 K       Per office protocol, patient can hold Eliquis for 2 days prior to procedure.  I will route this recommendation to the requesting party via Epic fax function and remove from pre-op pool.  Please call with questions.  [name] Ripple. Lovelle Lema NP-C     02/19/2023, 2:06 PM [facility] [address] Office [phone] Fax [phone]

## 2023-02-19 NOTE — Telephone Encounter (Signed)
   Pre-operative Risk Assessment    Patient Name: [name] Ross  DOB: 01-04-1966 MRN: [id number]      Request for Surgical Clearance    Procedure:  Bilateral upper eyelid blepharoplasty, endoscopic brow lift  Date of Surgery:  Clearance 03/05/23                                 Surgeon:  not indicated Surgeon's Group or Practice Name:  [facility] Dept of Ophthalmology Phone number:  [phone] Fax number:  [phone]   Type of Clearance Requested:  Pharmacy hold Eliquis hold 2days pre op    Type of Anesthesia:  General    Additional requests/questions:    Signed, Narda Amber   02/19/2023, 1:33 PM

## 2023-02-25 ENCOUNTER — Other Ambulatory Visit: Payer: Self-pay

## 2023-02-25 DIAGNOSIS — I480 Paroxysmal atrial fibrillation: Secondary | ICD-10-CM

## 2023-02-25 MED ORDER — APIXABAN 5 MG PO TABS: 5.0000 mg | ORAL_TABLET | Freq: Two times a day (BID) | ORAL | 5 refills | Status: DC

## 2023-02-25 NOTE — Telephone Encounter (Signed)
Prescription refill request for Eliquis received. Indication: Afib  Last office visit: 12/29/22 ([facility])  Scr: 0.77 (06/29/22)  Age: 57 Weight: 100.8kg  Appropriate dose. Refill sent.

## 2023-03-09 ENCOUNTER — Other Ambulatory Visit: Payer: Self-pay | Admitting: Gastroenterology

## 2023-03-18 ENCOUNTER — Other Ambulatory Visit: Payer: Self-pay

## 2023-04-07 DIAGNOSIS — Z6835 Body mass index (BMI) 35.0-35.9, adult: Secondary | ICD-10-CM | POA: Diagnosis not present

## 2023-04-07 DIAGNOSIS — I10 Essential (primary) hypertension: Secondary | ICD-10-CM | POA: Diagnosis not present

## 2023-04-07 DIAGNOSIS — M79671 Pain in right foot: Secondary | ICD-10-CM | POA: Diagnosis not present

## 2023-04-07 DIAGNOSIS — E66812 Obesity, class 2: Secondary | ICD-10-CM | POA: Diagnosis not present

## 2023-04-14 DIAGNOSIS — E66812 Obesity, class 2: Secondary | ICD-10-CM | POA: Diagnosis not present

## 2023-04-14 DIAGNOSIS — G4733 Obstructive sleep apnea (adult) (pediatric): Secondary | ICD-10-CM | POA: Diagnosis not present

## 2023-04-14 DIAGNOSIS — E785 Hyperlipidemia, unspecified: Secondary | ICD-10-CM | POA: Diagnosis not present

## 2023-04-14 DIAGNOSIS — I10 Essential (primary) hypertension: Secondary | ICD-10-CM | POA: Diagnosis not present

## 2023-04-19 DIAGNOSIS — S93491A Sprain of other ligament of right ankle, initial encounter: Secondary | ICD-10-CM | POA: Diagnosis not present

## 2023-04-19 DIAGNOSIS — M25571 Pain in right ankle and joints of right foot: Secondary | ICD-10-CM | POA: Diagnosis not present

## 2023-05-06 DIAGNOSIS — M25571 Pain in right ankle and joints of right foot: Secondary | ICD-10-CM | POA: Diagnosis not present

## 2023-05-08 DIAGNOSIS — I4891 Unspecified atrial fibrillation: Secondary | ICD-10-CM | POA: Diagnosis not present

## 2023-05-08 DIAGNOSIS — E785 Hyperlipidemia, unspecified: Secondary | ICD-10-CM | POA: Diagnosis not present

## 2023-05-08 DIAGNOSIS — I10 Essential (primary) hypertension: Secondary | ICD-10-CM | POA: Diagnosis not present

## 2023-05-08 DIAGNOSIS — K219 Gastro-esophageal reflux disease without esophagitis: Secondary | ICD-10-CM | POA: Diagnosis not present

## 2023-05-08 DIAGNOSIS — Z8249 Family history of ischemic heart disease and other diseases of the circulatory system: Secondary | ICD-10-CM | POA: Diagnosis not present

## 2023-05-08 DIAGNOSIS — Z7901 Long term (current) use of anticoagulants: Secondary | ICD-10-CM | POA: Diagnosis not present

## 2023-05-08 DIAGNOSIS — G2581 Restless legs syndrome: Secondary | ICD-10-CM | POA: Diagnosis not present

## 2023-05-08 DIAGNOSIS — L309 Dermatitis, unspecified: Secondary | ICD-10-CM | POA: Diagnosis not present

## 2023-05-08 DIAGNOSIS — G4733 Obstructive sleep apnea (adult) (pediatric): Secondary | ICD-10-CM | POA: Diagnosis not present

## 2023-05-08 DIAGNOSIS — Z7951 Long term (current) use of inhaled steroids: Secondary | ICD-10-CM | POA: Diagnosis not present

## 2023-05-08 DIAGNOSIS — Z833 Family history of diabetes mellitus: Secondary | ICD-10-CM | POA: Diagnosis not present

## 2023-05-08 DIAGNOSIS — F411 Generalized anxiety disorder: Secondary | ICD-10-CM | POA: Diagnosis not present

## 2023-05-21 ENCOUNTER — Other Ambulatory Visit: Payer: Self-pay | Admitting: Gastroenterology

## 2023-05-25 ENCOUNTER — Telehealth: Payer: Self-pay | Admitting: Gastroenterology

## 2023-05-25 MED ORDER — LUBIPROSTONE 24 MCG PO CAPS: 24.0000 ug | ORAL_CAPSULE | Freq: Two times a day (BID) | ORAL | 1 refills | Status: DC

## 2023-05-25 NOTE — Telephone Encounter (Signed)
Inbound call from patient requesting refill for Amitiza. Patient has been scheduled for 1/16 at 3:00 with [facility]. Please advise.

## 2023-05-25 NOTE — Telephone Encounter (Signed)
Amitiza refilled as requested. Left voice mail message for Laresa that this has been done.

## 2023-06-03 ENCOUNTER — Other Ambulatory Visit: Payer: Self-pay | Admitting: Physician Assistant

## 2023-06-03 DIAGNOSIS — I480 Paroxysmal atrial fibrillation: Secondary | ICD-10-CM

## 2023-06-03 DIAGNOSIS — I10 Essential (primary) hypertension: Secondary | ICD-10-CM

## 2023-06-10 ENCOUNTER — Ambulatory Visit: Payer: 59 | Admitting: Gastroenterology

## 2023-06-10 NOTE — Progress Notes (Deleted)
Chief Complaint: Med refill Primary GI MD: Dr. Adela Lank  HPI: 58 year old female history of A-fib (on Eliquis), diabetes, IBS/chronic constipation, GERD, presents for medication refill.  Last seen June 2023 by Dr. Adela Lank. Longstanding history of chronic reflux ongoing for years.  Previously on Zantac, then Protonix, now omeprazole 40 Mg twice daily which is well-controlled.  EGD at last visit was postponed due to issues with A-fib since she had a recent cardioversion and then ablation.  She had been seen by ENT and had a laryngoscopy which was unrevealing.  At last visit patient was scheduled for EGD/colonoscopy.  EGD overall unrevealing.  Colonoscopy with few small polyps.  Patient has longstanding history of IBS-C currently on Amitiza 24 mcg twice daily (failed miralax and trulance)  She has a history of diverticulitis x 4 in the past and has been offered surgery but declined.  ---------------TODAY--------------------------------  Discussed the use of AI scribe software for clinical note transcription with the patient, who gave verbal consent to proceed.  History of Present Illness             PREVIOUS GI WORKUP   Colonoscopy 05/2011 - diverticulosis, otherwise normal. No adenomas.     Echocardiogram 12/20/19 - EF 60-65%, otherwise normal   Nuclear stress test 08/30/20 - low risk study, normal EF   Barium swallow 06/17/21: IMPRESSION: Normal esophagram.  No explanation for patient's symptoms.  Colonoscopy 11/13/2021 - Three 3 to 5 mm polyps (tubular adenoma).  In the ascending colon, removed with a cold snare. Resected and retrieved.  - One 4 mm polyp (tubular adenoma) in the transverse colon, removed with a cold snare. Resected and retrieved.  - Diverticulosis in the ascending colon and in the left colon. - Internal hemorrhoids.  - The examination was otherwise normal. - Repeat 5 years  EGD 11/13/21 - Esophagogastric landmarks identified.  - Normal esophagus  otherwise  - A single small gastric polypoid lesion. Biopsied. (Hyperplastic polyp) - Normal stomach otherwise  - Normal examined duodenum.  Past Medical History:  Diagnosis Date   Allergy    Anal skin tag    Anemia    Anxiety    Atrial fibrillation (HCC)    Depression    Diabetes mellitus without complication (HCC)    pre DM   Diverticulosis    Diverticulosis, sigmoid    Dyslipidemia    GERD (gastroesophageal reflux disease)    Hemorrhoids    internal and external   History of diverticulitis    10/ 2016   History of galactorrhea    History of stress test    a. Pt reports h/o stress test and echo by cardiologist in [place] (no records in Epic).  Both reportedly nl.   Hyperlipidemia    Hypertension    IBS (irritable bowel syndrome)    Morbid obesity (HCC)    PONV (postoperative nausea and vomiting)    severe   Rash    elbows and behind knees   Restless leg syndrome    Sleep apnea    wears c-pap   Wears glasses     Past Surgical History:  Procedure Laterality Date   ATRIAL FIBRILLATION ABLATION N/A 07/31/2021   Procedure: ATRIAL FIBRILLATION ABLATION;  Surgeon: Lanier Prude, MD;  Location: [facility];  Service: Cardiovascular;  Laterality: N/A;   BAND HEMORRHOIDECTOMY  2012   Dr Kinnie Scales   BREAST BIOPSY Right 08/25/2022   [place] RT BREAST BX W LOC DEV 1ST LESION IMG BX SPEC US GUIDE 08/25/2022  [facility]   CARDIOVERSION N/A 04/08/2021   Procedure: CARDIOVERSION;  Surgeon: Yvonne Kendall, MD;  Location: [facility];  Service: Cardiovascular;  Laterality: N/A;   CESAREAN SECTION  1994   COLONOSCOPY  06/18/2011   CYSTO/  BILATERAL PYELOGRAM RETROGRADES/  TRANSOBTURATOR SLING  03/08/2006   EXCISION OF SKIN TAG N/A 02/13/2016   Procedure: ANAL SKIN TAG EXCISION;  Surgeon: Romie Levee, MD;  Location: [facility] ;  Service: General;  Laterality: N/A;   LAPAROSCOPIC CHOLECYSTECTOMY  05/22/2001   SHOULDER SURGERY      Current Outpatient Medications   Medication Sig Dispense Refill   acetaminophen (TYLENOL) 500 MG tablet Take 1,000 mg by mouth every 6 (six) hours as needed for mild pain.     albuterol (VENTOLIN HFA) 108 (90 Base) MCG/ACT inhaler Inhale 2 puffs into the lungs every 6 (six) hours as needed for wheezing or shortness of breath.     apixaban (ELIQUIS) 5 MG TABS tablet Take 1 tablet (5 mg total) by mouth 2 (two) times daily. 60 tablet 5   Ascorbic Acid (VITAMIN C) 1000 MG tablet Take by mouth daily.     buPROPion (WELLBUTRIN XL) 300 MG 24 hr tablet Take 300 mg by mouth every morning.      butalbital-acetaminophen-caffeine (FIORICET) 50-325-40 MG tablet Take 1 tablet by mouth 2 (two) times daily as needed for headache.     carvedilol (COREG) 25 MG tablet TAKE TWO TABLETS BY MOUTH TWICE DAILY 120 tablet 5   dicyclomine (BENTYL) 10 MG capsule Take one tablet by mouth once to twice daily as needed 90 capsule 3   escitalopram (LEXAPRO) 20 MG tablet Take 20 mg by mouth every morning.      estradiol (ESTRACE) 2 MG tablet Take 2 mg by mouth daily.     famotidine (PEPCID) 40 MG tablet Take 1 tablet (40 mg total) by mouth 2 (two) times daily for gerd 60 tablet 11   fenofibrate micronized (LOFIBRA) 200 MG capsule Take 200 mg by mouth daily before breakfast.   4   fexofenadine (ALLEGRA) 180 MG tablet      fluticasone (FLONASE) 50 MCG/ACT nasal spray Place 1 spray into both nostrils daily.     ibuprofen (ADVIL) 200 MG tablet Take 600-800 mg by mouth every 6 (six) hours as needed for moderate pain.     lubiprostone (AMITIZA) 24 MCG capsule Take 1 capsule (24 mcg total) by mouth 2 (two) times daily with a meal. Please schedule a yearly follow up appointment for further refills. Thank you. 60 capsule 1   magnesium oxide (MAG-OX) 400 (240 Mg) MG tablet Take 400 mg by mouth daily.     meclizine (ANTIVERT) 25 MG tablet Take 1 tablet (25 mg total) by mouth 3 (three) times daily as needed for dizziness. 30 tablet 0   mometasone (ELOCON) 0.1 % ointment  Apply 1 application. topically 2 (two) times daily as needed (rash).     omega-3 acid ethyl esters (LOVAZA) 1 g capsule Take 1 capsule (1 g total) by mouth 2 (two) times daily. 60 capsule 5   omeprazole (PRILOSEC) 40 MG capsule Take 1 capsule (40 mg total) by mouth in the morning and at bedtime. 60 capsule 1   ondansetron (ZOFRAN-ODT) 8 MG disintegrating tablet Take 8 mg by mouth every 8 (eight) hours as needed.     pramipexole (MIRAPEX) 0.75 MG tablet Take 1 tablet (0.75 mg total) by mouth at bedtime. 30 tablet 7   progesterone (PROMETRIUM) 200 MG capsule  Take 200 mg by mouth at bedtime. (Patient not taking: Reported on 09/09/2022)     Semaglutide, 2 MG/DOSE, (OZEMPIC, 2 MG/DOSE,) 8 MG/3ML SOPN Inject 2 mg into the skin once a week. 3 mL 11   tacrolimus (PROTOPIC) 0.1 % ointment Apply 1 application. topically 2 (two) times daily as needed (rash).     triamcinolone (KENALOG) 0.1 % Apply 1 application. topically 2 (two) times daily as needed (rash).     triamterene-hydrochlorothiazide (MAXZIDE-25) 37.5-25 MG per tablet Take 0.5 tablets by mouth every morning.      valACYclovir (VALTREX) 1000 MG tablet Take 1,000 mg by mouth 2 (two) times daily as needed (cold sores).     zolpidem (AMBIEN) 10 MG tablet Take 10 mg by mouth at bedtime as needed for sleep.     Current Facility-Administered Medications  Medication Dose Route Frequency Provider Last Rate Last Admin   0.9 %  sodium chloride infusion  500 mL Intravenous Once Armbruster, Willaim Rayas, MD        Allergies as of 06/10/2023 - Review Complete 12/28/2022  Allergen Reaction Noted   Metformin hcl  08/27/2020   Venlafaxine  03/27/2020    Family History  Problem Relation Age of Onset   Colon cancer Mother    Diabetes Mother    Hypertension Mother    Atrial fibrillation Mother    Diabetes Father        committed suicide   Skin cancer Father    Suicidality Father    Hypertension Father    Diabetes Sister    Diabetes Sister    Breast  cancer Other        materanl great aunt   Sleep apnea Neg Hx    Esophageal cancer Neg Hx    Rectal cancer Neg Hx    Stomach cancer Neg Hx    Restless legs syndrome Neg Hx     Social History   Socioeconomic History   Marital status: Married    Spouse name: Merchant navy officer   Number of children: 2   Years of education: RN   Highest education level: Not on file  Occupational History   Occupation: RN  Tobacco Use   Smoking status: Never   Smokeless tobacco: Never   Tobacco comments:    Never smoke 08/26/21  Vaping Use   Vaping status: Never Used  Substance and Sexual Activity   Alcohol use: No    Alcohol/week: 0.0 standard drinks of alcohol   Drug use: No   Sexual activity: Not on file    Comment: post menopausal per pt  Other Topics Concern   Not on file  Social History Narrative   Lives in [place].  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  Occasionally drinks coffee but not a heavy caffeine drinker.     Social Drivers of Corporate investment banker Strain: Not on file  Food Insecurity: Not on file  Transportation Needs: Not on file  Physical Activity: Not on file  Stress: Not on file  Social Connections: Not on file  Intimate Partner Violence: Not on file    Review of Systems:    Constitutional: No weight loss, fever, chills, weakness or fatigue HEENT: Eyes: No change in vision               Ears, Nose, Throat:  No change in hearing or congestion Skin: No rash or itching Cardiovascular: No chest pain, chest pressure or palpitations   Respiratory: No SOB or cough Gastrointestinal:  See HPI and otherwise negative Genitourinary: No dysuria or change in urinary frequency Neurological: No headache, dizziness or syncope Musculoskeletal: No new muscle or joint pain Hematologic: No bleeding or bruising Psychiatric: No history of depression or anxiety    Physical Exam:  Vital signs: LMP 02/13/2015   Constitutional: NAD, Well developed, Well nourished, alert  and cooperative Head:  Normocephalic and atraumatic. Eyes:   PEERL, EOMI. No icterus. Conjunctiva pink. Respiratory: Respirations even and unlabored. Lungs clear to auscultation bilaterally.   No wheezes, crackles, or rhonchi.  Cardiovascular:  Regular rate and rhythm. No peripheral edema, cyanosis or pallor.  Gastrointestinal:  Soft, nondistended, nontender. No rebound or guarding. Normal bowel sounds. No appreciable masses or hepatomegaly. Rectal:  Not performed.  Msk:  Symmetrical without gross deformities. Without edema, no deformity or joint abnormality.  Neurologic:  Alert and  oriented x4;  grossly normal neurologically.  Skin:   Dry and intact without significant lesions or rashes. Psychiatric: Oriented to person, place and time. Demonstrates good judgement and reason without abnormal affect or behaviors.  Physical Exam           RELEVANT LABS AND IMAGING: CBC    Component Value Date/Time   WBC 10.3 06/29/2022 1556   RBC 4.58 06/29/2022 1556   HGB 13.7 06/29/2022 1556   HGB 11.1 07/11/2021 1332   HCT 40.8 06/29/2022 1556   HCT 31.7 (L) 07/11/2021 1332   PLT 549 (H) 06/29/2022 1556   PLT 573 (H) 07/11/2021 1332   MCV 89.1 06/29/2022 1556   MCV 87 07/11/2021 1332   MCH 29.9 06/29/2022 1556   MCHC 33.6 06/29/2022 1556   RDW 12.8 06/29/2022 1556   RDW 13.2 07/11/2021 1332   LYMPHSABS 2.1 07/11/2021 1332   MONOABS 0.7 04/05/2021 1912   EOSABS 0.3 07/11/2021 1332   BASOSABS 0.1 07/11/2021 1332    CMP     Component Value Date/Time   NA 133 (L) 06/29/2022 1556   NA 141 07/11/2021 1332   K 3.5 06/29/2022 1556   CL 102 06/29/2022 1556   CO2 22 06/29/2022 1556   GLUCOSE 112 (H) 06/29/2022 1556   BUN 17 06/29/2022 1556   BUN 16 07/11/2021 1332   CREATININE 0.77 06/29/2022 1556   CALCIUM 9.4 06/29/2022 1556   PROT 7.7 04/05/2021 1912   ALBUMIN 3.5 04/05/2021 1912   AST 22 04/05/2021 1912   ALT 14 04/05/2021 1912   ALKPHOS 32 (L) 04/05/2021 1912   BILITOT 0.7  04/05/2021 1912   GFRNONAA >60 06/29/2022 1556   GFRAA >60 12/20/2019 0502     Assessment/Plan:   Assessment and Plan                Rajeev Escue Jolee Ewing [facility] 06/10/2023, 10:38 AM  Cc: Garlan Fillers, MD

## 2023-06-23 DIAGNOSIS — N950 Postmenopausal bleeding: Secondary | ICD-10-CM | POA: Diagnosis not present

## 2023-06-23 DIAGNOSIS — N840 Polyp of corpus uteri: Secondary | ICD-10-CM | POA: Diagnosis not present

## 2023-06-23 DIAGNOSIS — Z01419 Encounter for gynecological examination (general) (routine) without abnormal findings: Secondary | ICD-10-CM | POA: Diagnosis not present

## 2023-06-23 DIAGNOSIS — R6882 Decreased libido: Secondary | ICD-10-CM | POA: Diagnosis not present

## 2023-06-23 DIAGNOSIS — Z6836 Body mass index (BMI) 36.0-36.9, adult: Secondary | ICD-10-CM | POA: Diagnosis not present

## 2023-06-24 DIAGNOSIS — G4733 Obstructive sleep apnea (adult) (pediatric): Secondary | ICD-10-CM | POA: Diagnosis not present

## 2023-06-28 DIAGNOSIS — I10 Essential (primary) hypertension: Secondary | ICD-10-CM | POA: Diagnosis not present

## 2023-06-28 DIAGNOSIS — D649 Anemia, unspecified: Secondary | ICD-10-CM | POA: Diagnosis not present

## 2023-06-28 DIAGNOSIS — R7301 Impaired fasting glucose: Secondary | ICD-10-CM | POA: Diagnosis not present

## 2023-06-28 DIAGNOSIS — E785 Hyperlipidemia, unspecified: Secondary | ICD-10-CM | POA: Diagnosis not present

## 2023-06-28 DIAGNOSIS — Z1212 Encounter for screening for malignant neoplasm of rectum: Secondary | ICD-10-CM | POA: Diagnosis not present

## 2023-06-30 DIAGNOSIS — D2262 Melanocytic nevi of left upper limb, including shoulder: Secondary | ICD-10-CM | POA: Diagnosis not present

## 2023-06-30 DIAGNOSIS — D2272 Melanocytic nevi of left lower limb, including hip: Secondary | ICD-10-CM | POA: Diagnosis not present

## 2023-06-30 DIAGNOSIS — D2261 Melanocytic nevi of right upper limb, including shoulder: Secondary | ICD-10-CM | POA: Diagnosis not present

## 2023-06-30 DIAGNOSIS — D2271 Melanocytic nevi of right lower limb, including hip: Secondary | ICD-10-CM | POA: Diagnosis not present

## 2023-06-30 DIAGNOSIS — D225 Melanocytic nevi of trunk: Secondary | ICD-10-CM | POA: Diagnosis not present

## 2023-06-30 DIAGNOSIS — L821 Other seborrheic keratosis: Secondary | ICD-10-CM | POA: Diagnosis not present

## 2023-07-05 DIAGNOSIS — Z0000 Encounter for general adult medical examination without abnormal findings: Secondary | ICD-10-CM | POA: Diagnosis not present

## 2023-07-05 DIAGNOSIS — I480 Paroxysmal atrial fibrillation: Secondary | ICD-10-CM | POA: Diagnosis not present

## 2023-07-05 DIAGNOSIS — G4733 Obstructive sleep apnea (adult) (pediatric): Secondary | ICD-10-CM | POA: Diagnosis not present

## 2023-07-05 DIAGNOSIS — E785 Hyperlipidemia, unspecified: Secondary | ICD-10-CM | POA: Diagnosis not present

## 2023-07-05 DIAGNOSIS — G2581 Restless legs syndrome: Secondary | ICD-10-CM | POA: Diagnosis not present

## 2023-07-07 ENCOUNTER — Encounter (INDEPENDENT_AMBULATORY_CARE_PROVIDER_SITE_OTHER): Payer: Self-pay

## 2023-07-07 ENCOUNTER — Encounter (INDEPENDENT_AMBULATORY_CARE_PROVIDER_SITE_OTHER): Payer: 59 | Admitting: Family Medicine

## 2023-07-09 DIAGNOSIS — Z13820 Encounter for screening for osteoporosis: Secondary | ICD-10-CM | POA: Diagnosis not present

## 2023-07-19 ENCOUNTER — Ambulatory Visit: Payer: 59 | Admitting: Gastroenterology

## 2023-07-26 ENCOUNTER — Encounter: Payer: Self-pay | Admitting: Gastroenterology

## 2023-07-26 ENCOUNTER — Ambulatory Visit: Payer: 59 | Admitting: Gastroenterology

## 2023-07-26 VITALS — BP 124/74 | HR 92 | Ht 66.0 in | Wt 228.2 lb

## 2023-07-26 DIAGNOSIS — K5909 Other constipation: Secondary | ICD-10-CM | POA: Diagnosis not present

## 2023-07-26 DIAGNOSIS — K219 Gastro-esophageal reflux disease without esophagitis: Secondary | ICD-10-CM

## 2023-07-26 DIAGNOSIS — R1084 Generalized abdominal pain: Secondary | ICD-10-CM

## 2023-07-26 MED ORDER — LUBIPROSTONE 24 MCG PO CAPS: 24.0000 ug | ORAL_CAPSULE | Freq: Two times a day (BID) | ORAL | 11 refills | Status: AC

## 2023-07-26 MED ORDER — DICYCLOMINE HCL 10 MG PO CAPS: ORAL_CAPSULE | ORAL | 11 refills | Status: AC

## 2023-07-26 NOTE — Progress Notes (Signed)
 Agree with assessment and plan as outlined.

## 2023-07-26 NOTE — Progress Notes (Signed)
 Chief Complaint: Med refill Primary GI MD: Dr. Adela Lank  HPI: 58 year old female history of A-fib (on Eliquis), diabetes, IBS/chronic constipation, GERD, presents for medication refill.  Last seen June 2023 by Dr. Adela Lank. Longstanding history of chronic reflux ongoing for years.  Previously on Zantac, then Protonix, now omeprazole 40 Mg twice daily which is well-controlled.  EGD at last visit was postponed due to issues with A-fib since she had a recent cardioversion and then ablation.  She had been seen by ENT and had a laryngoscopy which was unrevealing.  At last visit patient was scheduled for EGD/colonoscopy.  EGD overall unrevealing.  Colonoscopy with few small polyps.  Patient has longstanding history of IBS-C currently on Amitiza 24 mcg twice daily (failed miralax and trulance)  She has a history of diverticulitis x 4 in the past and has been offered surgery but declined.  ---------------TODAY--------------------------------  Patient is here today for medication refill on Amitiza and Bentyl.  She takes Amitiza 24 mcg twice daily.  States she does not always take it twice daily she occasionally just takes once at bedtime and is doing well with no difficulty.  Having adequate bowel movements.  No complaints.  She will take Bentyl as needed throughout the year for abdominal cramping which works well.  She does not take it frequently but he would like a refill.  In regards to her GERD she no longer takes her omeprazole and she is taking Aciphex and doing well with adequate control.  Aciphex as prescribed by PCP  PREVIOUS GI WORKUP   Colonoscopy 05/2011 - diverticulosis, otherwise normal. No adenomas.     Echocardiogram 12/20/19 - EF 60-65%, otherwise normal   Nuclear stress test 08/30/20 - low risk study, normal EF   Barium swallow 06/17/21: IMPRESSION: Normal esophagram.  No explanation for patient's symptoms.  Colonoscopy 11/13/2021 - Three 3 to 5 mm polyps (tubular  adenoma).  In the ascending colon, removed with a cold snare. Resected and retrieved.  - One 4 mm polyp (tubular adenoma) in the transverse colon, removed with a cold snare. Resected and retrieved.  - Diverticulosis in the ascending colon and in the left colon. - Internal hemorrhoids.  - The examination was otherwise normal. - Repeat 5 years  EGD 11/13/21 - Esophagogastric landmarks identified.  - Normal esophagus otherwise  - A single small gastric polypoid lesion. Biopsied. (Hyperplastic polyp) - Normal stomach otherwise  - Normal examined duodenum.   Past Medical History:  Diagnosis Date   Allergy    Anal skin tag    Anemia    Anxiety    Atrial fibrillation (HCC)    Depression    Diabetes mellitus without complication (HCC)    pre DM   Diverticulosis    Diverticulosis, sigmoid    Dyslipidemia    GERD (gastroesophageal reflux disease)    Hemorrhoids    internal and external   History of diverticulitis    10/ 2016   History of galactorrhea    History of stress test    a. Pt reports h/o stress test and echo by cardiologist in [place] (no records in Epic).  Both reportedly nl.   Hyperlipidemia    Hypertension    IBS (irritable bowel syndrome)    Morbid obesity (HCC)    PONV (postoperative nausea and vomiting)    severe   Rash    elbows and behind knees   Restless leg syndrome    Sleep apnea    wears c-pap   Wears glasses  Past Surgical History:  Procedure Laterality Date   ATRIAL FIBRILLATION ABLATION N/A 07/31/2021   Procedure: ATRIAL FIBRILLATION ABLATION;  Surgeon: Lanier Prude, MD;  Location: [facility];  Service: Cardiovascular;  Laterality: N/A;   BAND HEMORRHOIDECTOMY  2012   Dr Kinnie Scales   BREAST BIOPSY Right 08/25/2022   [place] RT BREAST BX W LOC DEV 1ST LESION IMG BX SPEC US GUIDE 08/25/2022 [facility]   CARDIOVERSION N/A 04/08/2021   Procedure: CARDIOVERSION;  Surgeon: Yvonne Kendall, MD;  Location: [facility];  Service: Cardiovascular;   Laterality: N/A;   CESAREAN SECTION  1994   COLONOSCOPY  06/18/2011   CYSTO/  BILATERAL PYELOGRAM RETROGRADES/  TRANSOBTURATOR SLING  03/08/2006   EXCISION OF SKIN TAG N/A 02/13/2016   Procedure: ANAL SKIN TAG EXCISION;  Surgeon: Romie Levee, MD;  Location: [facility];  Service: General;  Laterality: N/A;   LAPAROSCOPIC CHOLECYSTECTOMY  05/22/2001   SHOULDER SURGERY      Current Outpatient Medications  Medication Sig Dispense Refill   acetaminophen (TYLENOL) 500 MG tablet Take 1,000 mg by mouth every 6 (six) hours as needed for mild pain.     albuterol (VENTOLIN HFA) 108 (90 Base) MCG/ACT inhaler Inhale 2 puffs into the lungs every 6 (six) hours as needed for wheezing or shortness of breath.     apixaban (ELIQUIS) 5 MG TABS tablet Take 1 tablet (5 mg total) by mouth 2 (two) times daily. 60 tablet 5   Ascorbic Acid (VITAMIN C) 1000 MG tablet Take by mouth daily.     buPROPion (WELLBUTRIN XL) 300 MG 24 hr tablet Take 300 mg by mouth every morning.      butalbital-acetaminophen-caffeine (FIORICET) 50-325-40 MG tablet Take 1 tablet by mouth 2 (two) times daily as needed for headache.     CALCIUM PO Take 1,200 tablets by mouth daily.     carvedilol (COREG) 25 MG tablet TAKE TWO TABLETS BY MOUTH TWICE DAILY 120 tablet 5   Cholecalciferol (VITAMIN D-3 PO) Take 2,000 Units by mouth daily.     dicyclomine (BENTYL) 10 MG capsule Take one tablet by mouth once to twice daily as needed 90 capsule 3   escitalopram (LEXAPRO) 20 MG tablet Take 20 mg by mouth every morning.      estradiol (ESTRACE) 2 MG tablet Take 2 mg by mouth daily.     fenofibrate micronized (LOFIBRA) 200 MG capsule Take 200 mg by mouth daily before breakfast.   4   fexofenadine (ALLEGRA) 180 MG tablet      fluticasone (FLONASE) 50 MCG/ACT nasal spray Place 1 spray into both nostrils daily.     hydrOXYzine (ATARAX) 25 MG tablet Take 25 mg by mouth at bedtime.     ibuprofen (ADVIL) 200 MG tablet Take 600-800 mg by  mouth every 6 (six) hours as needed for moderate pain.     losartan (COZAAR) 50 MG tablet Take 50 mg by mouth every morning.     lubiprostone (AMITIZA) 24 MCG capsule Take 1 capsule (24 mcg total) by mouth 2 (two) times daily with a meal. Please schedule a yearly follow up appointment for further refills. Thank you. 60 capsule 1   magnesium oxide (MAG-OX) 400 (240 Mg) MG tablet Take 400 mg by mouth daily.     meclizine (ANTIVERT) 25 MG tablet Take 1 tablet (25 mg total) by mouth 3 (three) times daily as needed for dizziness. 30 tablet 0   mometasone (ELOCON) 0.1 % ointment Apply 1 application. topically 2 (  two) times daily as needed (rash).     omega-3 acid ethyl esters (LOVAZA) 1 g capsule Take 1 capsule (1 g total) by mouth 2 (two) times daily. 60 capsule 5   omeprazole (PRILOSEC) 40 MG capsule Take 1 capsule (40 mg total) by mouth in the morning and at bedtime. 60 capsule 1   ondansetron (ZOFRAN-ODT) 8 MG disintegrating tablet Take 8 mg by mouth every 8 (eight) hours as needed.     pramipexole (MIRAPEX) 0.75 MG tablet Take 1 tablet (0.75 mg total) by mouth at bedtime. 30 tablet 7   RABEprazole (ACIPHEX) 20 MG tablet Take 20 mg by mouth every morning.     Semaglutide, 2 MG/DOSE, (OZEMPIC, 2 MG/DOSE,) 8 MG/3ML SOPN Inject 2 mg into the skin once a week. 3 mL 11   tacrolimus (PROTOPIC) 0.1 % ointment Apply 1 application. topically 2 (two) times daily as needed (rash).     triamcinolone (KENALOG) 0.1 % Apply 1 application. topically 2 (two) times daily as needed (rash).     triamterene-hydrochlorothiazide (MAXZIDE-25) 37.5-25 MG per tablet Take 0.5 tablets by mouth every morning.      valACYclovir (VALTREX) 1000 MG tablet Take 1,000 mg by mouth 2 (two) times daily as needed (cold sores).     zolpidem (AMBIEN) 10 MG tablet Take 10 mg by mouth at bedtime as needed for sleep.     Current Facility-Administered Medications  Medication Dose Route Frequency Provider Last Rate Last Admin   0.9 %  sodium  chloride infusion  500 mL Intravenous Once Armbruster, Willaim Rayas, MD        Allergies as of 07/26/2023 - Review Complete 07/26/2023  Allergen Reaction Noted   Metformin hcl  08/27/2020   Venlafaxine  03/27/2020    Family History  Problem Relation Age of Onset   Colon cancer Mother    Diabetes Mother    Hypertension Mother    Atrial fibrillation Mother    Diabetes Father        committed suicide   Skin cancer Father    Suicidality Father    Hypertension Father    Diabetes Sister    Diabetes Sister    Breast cancer Other        materanl great aunt   Sleep apnea Neg Hx    Esophageal cancer Neg Hx    Rectal cancer Neg Hx    Stomach cancer Neg Hx    Restless legs syndrome Neg Hx     Social History   Socioeconomic History   Marital status: Married    Spouse name: Merchant navy officer   Number of children: 2   Years of education: RN   Highest education level: Not on file  Occupational History   Occupation: RN  Tobacco Use   Smoking status: Never   Smokeless tobacco: Never   Tobacco comments:    Never smoke 08/26/21  Vaping Use   Vaping status: Never Used  Substance and Sexual Activity   Alcohol use: No    Alcohol/week: 0.0 standard drinks of alcohol   Drug use: No   Sexual activity: Not on file    Comment: post menopausal per pt  Other Topics Concern   Not on file  Social History Narrative   Lives in [place].  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  Occasionally drinks coffee but not a heavy caffeine drinker.     Social Drivers of Corporate investment banker Strain: Not on file  Food Insecurity: Not on file  Transportation Needs: Not on file  Physical Activity: Not on file  Stress: Not on file  Social Connections: Not on file  Intimate Partner Violence: Not on file    Review of Systems:    Constitutional: No weight loss, fever, chills, weakness or fatigue HEENT: Eyes: No change in vision               Ears, Nose, Throat:  No change in hearing or  congestion Skin: No rash or itching Cardiovascular: No chest pain, chest pressure or palpitations   Respiratory: No SOB or cough Gastrointestinal: See HPI and otherwise negative Genitourinary: No dysuria or change in urinary frequency Neurological: No headache, dizziness or syncope Musculoskeletal: No new muscle or joint pain Hematologic: No bleeding or bruising Psychiatric: No history of depression or anxiety    Physical Exam:  Vital signs: BP 124/74 (BP Location: Left Arm, Patient Position: Sitting, Cuff Size: Normal)   Pulse 92   Ht 5\' 6"  (1.676 m)   Wt 228 lb 3.2 oz (103.5 kg)   LMP 02/13/2015   BMI 36.83 kg/m   Constitutional: NAD, Well developed, Well nourished, alert and cooperative Head:  Normocephalic and atraumatic. Eyes:   PEERL, EOMI. No icterus. Conjunctiva pink. Respiratory: Respirations even and unlabored. Lungs clear to auscultation bilaterally.   No wheezes, crackles, or rhonchi.  Cardiovascular:  Regular rate and rhythm. No peripheral edema, cyanosis or pallor.  Gastrointestinal:  Soft, nondistended, nontender. No rebound or guarding. Normal bowel sounds. No appreciable masses or hepatomegaly. Rectal:  Not performed.  Msk:  Symmetrical without gross deformities. Without edema, no deformity or joint abnormality.  Neurologic:  Alert and  oriented x4;  grossly normal neurologically.  Skin:   Dry and intact without significant lesions or rashes. Psychiatric: Oriented to person, place and time. Demonstrates good judgement and reason without abnormal affect or behaviors.   RELEVANT LABS AND IMAGING: CBC    Component Value Date/Time   WBC 10.3 06/29/2022 1556   RBC 4.58 06/29/2022 1556   HGB 13.7 06/29/2022 1556   HGB 11.1 07/11/2021 1332   HCT 40.8 06/29/2022 1556   HCT 31.7 (L) 07/11/2021 1332   PLT 549 (H) 06/29/2022 1556   PLT 573 (H) 07/11/2021 1332   MCV 89.1 06/29/2022 1556   MCV 87 07/11/2021 1332   MCH 29.9 06/29/2022 1556   MCHC 33.6 06/29/2022  1556   RDW 12.8 06/29/2022 1556   RDW 13.2 07/11/2021 1332   LYMPHSABS 2.1 07/11/2021 1332   MONOABS 0.7 04/05/2021 1912   EOSABS 0.3 07/11/2021 1332   BASOSABS 0.1 07/11/2021 1332    CMP     Component Value Date/Time   NA 133 (L) 06/29/2022 1556   NA 141 07/11/2021 1332   K 3.5 06/29/2022 1556   CL 102 06/29/2022 1556   CO2 22 06/29/2022 1556   GLUCOSE 112 (H) 06/29/2022 1556   BUN 17 06/29/2022 1556   BUN 16 07/11/2021 1332   CREATININE 0.77 06/29/2022 1556   CALCIUM 9.4 06/29/2022 1556   PROT 7.7 04/05/2021 1912   ALBUMIN 3.5 04/05/2021 1912   AST 22 04/05/2021 1912   ALT 14 04/05/2021 1912   ALKPHOS 32 (L) 04/05/2021 1912   BILITOT 0.7 04/05/2021 1912   GFRNONAA >60 06/29/2022 1556   GFRAA >60 12/20/2019 0502     Assessment/Plan:   Chronic constipation Longstanding history of chronic constipation well-controlled on Amitiza 24 mcg.  Up-to-date on colonoscopy.  Next due colonoscopy 2028.  Occasional abdominal pain well-controlled on Bentyl  as needed - Refill Amitiza 24 mcg 1 year - Refill Bentyl 1 year - Follow-up 1 year (or sooner if symptoms develop)  GERD Longstanding history of GERD with recent EGD which was overall unrevealing.  GERD currently well-controlled on Aciphex prescribed by PCP.   Lara Mulch [facility] 07/26/2023, 1:57 PM  Cc: Garlan Fillers, MD

## 2023-07-26 NOTE — Patient Instructions (Signed)
 We have sent the following medications to your pharmacy for you to pick up at your convenience: Amitiza and Bentyl.  _______________________________________________________  If your blood pressure at your visit was 140/90 or greater, please contact your primary care physician to follow up on this.  _______________________________________________________  If you are age 58 or older, your body mass index should be between 23-30. Your Body mass index is 36.83 kg/m. If this is out of the aforementioned range listed, please consider follow up with your Primary Care Provider.  If you are age 75 or younger, your body mass index should be between 19-25. Your Body mass index is 36.83 kg/m. If this is out of the aformentioned range listed, please consider follow up with your Primary Care Provider.   ________________________________________________________  The [facility] providers would like to encourage you to use [facility] to communicate with providers for non-urgent requests or questions.  Due to long hold times on the telephone, sending your provider a message by [facility] may be a faster and more efficient way to get a response.  Please allow 48 business hours for a response.  Please remember that this is for non-urgent requests.  _______________________________________________________

## 2023-07-28 ENCOUNTER — Encounter: Payer: Self-pay | Admitting: Cardiology

## 2023-07-31 ENCOUNTER — Telehealth: Payer: Self-pay | Admitting: Internal Medicine

## 2023-07-31 NOTE — Telephone Encounter (Signed)
 Patient called with symptoms of palpitation and fatigue. Kardia alerted her of afib~HR 130ss. Episode lasted about 15 minutes and then terminated. Patient now in NSR with HR in 80s.   Advised patient to continue apixaban and carvedilol. If recurrent arrhythmia or chest pain, advised patient on ED presentation.   Eustaquio Boyden, MD MS

## 2023-08-02 ENCOUNTER — Encounter: Payer: Self-pay | Admitting: Cardiology

## 2023-08-09 ENCOUNTER — Encounter (INDEPENDENT_AMBULATORY_CARE_PROVIDER_SITE_OTHER): Payer: Self-pay

## 2023-08-09 ENCOUNTER — Encounter (INDEPENDENT_AMBULATORY_CARE_PROVIDER_SITE_OTHER): Payer: 59 | Admitting: Family Medicine

## 2023-08-12 DIAGNOSIS — Z1231 Encounter for screening mammogram for malignant neoplasm of breast: Secondary | ICD-10-CM | POA: Diagnosis not present

## 2023-08-18 ENCOUNTER — Ambulatory Visit: Attending: Cardiology | Admitting: Cardiology

## 2023-08-18 VITALS — BP 128/78 | HR 86 | Ht 66.0 in | Wt 221.4 lb

## 2023-08-18 DIAGNOSIS — I480 Paroxysmal atrial fibrillation: Secondary | ICD-10-CM | POA: Diagnosis not present

## 2023-08-18 DIAGNOSIS — I483 Typical atrial flutter: Secondary | ICD-10-CM | POA: Diagnosis not present

## 2023-08-18 DIAGNOSIS — D6869 Other thrombophilia: Secondary | ICD-10-CM

## 2023-08-18 NOTE — Patient Instructions (Signed)
 Medication Instructions:  The current medical regimen is effective;  continue present plan and medications.  *If you need a refill on your cardiac medications before your next appointment, please call your pharmacy*   Follow-Up: At [facility], you and your health needs are our priority.  As part of our continuing mission to provide you with exceptional heart care, we have created designated Provider Care Teams.  These Care Teams include your primary Cardiologist (physician) and Advanced Practice Providers (APPs -  Physician Assistants and Nurse Practitioners) who all work together to provide you with the care you need, when you need it.  We recommend signing up for the patient portal called "MyChart".  Sign up information is provided on this After Visit Summary.  MyChart is used to connect with patients for Virtual Visits (Telemedicine).  Patients are able to view lab/test results, encounter notes, upcoming appointments, etc.  Non-urgent messages can be sent to your provider as well.   To learn more about what you can do with MyChart, go to [email or web address].    Your next appointment:   3 month(s)  Provider:   Sherie Don, NP    Other Instructions Increase physical activity

## 2023-08-18 NOTE — Progress Notes (Signed)
 Electrophysiology Clinic Note    Date:  08/18/2023  Patient ID:  [name] Ross Apr 15, 1966, MRN [id number] PCP:  Garlan Fillers, MD  Cardiologist:  Debbe Odea, MD Electrophysiologist: Lanier Prude, MD   Discussed the use of AI scribe software for clinical note transcription with the patient, who gave verbal consent to proceed.   Patient Profile    Chief Complaint: Afib follow-up  History of Present Illness: [name] Ross is a 58 y.o. female with PMH notable for parox afib, aflutter, HTN, OSA; seen today for Lanier Prude, MD for routine electrophysiology followup.   She is s/p AF ablation with PVI, posterior wall, and CTI on 07/2021 by Dr. Lalla Brothers. She last saw Dr. Lalla Brothers 08/2022 at which time was having intermittent skipped beats. Kardia Mobile reported them as PVCs. Her coreg was continued.   On follow-up today, she has had two recent AFib episodes both about in duration. One episode woke her from sleep at around 2am. She uses KardiaMobile to monitor and sent rhythm strips to office for review. The episodes happened within days of each other after not having any AF since her ablation. She has not noticed any change in her usual health status around this same time. She continues to use CPAP nightly. Neurology manages CPAP.   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   She continues to take eliquis BID, no bleeding concerns.   she denies chest pain, dyspnea, PND, orthopnea, nausea, vomiting, dizziness, syncope, edema, weight gain, or early satiety.      Arrhythmia/Device History Flecainide - stopped 2023     ROS:  Please see the history of present illness. All other systems are reviewed and otherwise negative.    Physical Exam    VS:  BP 128/78 (BP Location: Left Arm, Patient Position: Sitting, Cuff Size: Normal)   Pulse 86   Ht 5\' 6"  (1.676 m)   Wt 221 lb 6.4 oz (100.4 kg)   LMP  02/13/2015   SpO2 96%   BMI 35.73 kg/m  BMI: Body mass index is 35.73 kg/m.  Wt Readings from Last 3 Encounters:  08/18/23 221 lb 6.4 oz (100.4 kg)  07/26/23 228 lb 3.2 oz (103.5 kg)  12/28/22 222 lb 3.2 oz (100.8 kg)     GEN- The patient is well appearing, alert and oriented x 3 today.   Lungs- Clear to ausculation bilaterally, normal work of breathing.  Heart- Regular rate and rhythm, no murmurs, rubs or gallops Extremities- No peripheral edema, warm, dry    Studies Reviewed   Previous EP, cardiology notes.    EKG is ordered. Personal review of EKG from today shows:    EKG Interpretation Date/Time:  Wednesday August 18 2023 14:36:04 EDT Ventricular Rate:  86 PR Interval:  154 QRS Duration:  76 QT Interval:  384 QTC Calculation: 459 R Axis:   -2  Text Interpretation: Normal sinus rhythm Low voltage QRS Cannot rule out Anterior infarct , age undetermined Confirmed by Sherie Don [phone]) on 08/18/2023 3:22:03 PM    Long term monitor, 07/02/2022 HR 70 - 197 bpm, average 82 bpm. 1 nonsustained SVT, longest 4 beats. Rare supraventricular and ventricular ectopy. No atrial fibrillation. All symptom trigger episodes correspond to sinus rhythm with PAC or PVC.  Cardiac CTA, 07/24/2021 1. Normal coronary calcium score of 0. Patient is low risk for coronary events. 2. Normal coronary origin with right  dominance.  3. No evidence of CAD.  4. CAD-RADS 0. Consider non-atherosclerotic causes of chest pain.  TTE, 04/07/2021  1. Left ventricular ejection fraction, by estimation, is 60 to 65%. The left ventricle has normal function. The left ventricle has no regional wall motion abnormalities. There is mild asymmetric left ventricular hypertrophy of the basal-septal segment. Left ventricular diastolic parameters are indeterminate.   2. Right ventricular systolic function is normal. The right ventricular size is normal. Tricuspid regurgitation signal is inadequate for assessing PA  pressure.   3. The mitral valve is normal in structure. Trivial mitral valve regurgitation. No evidence of mitral stenosis.   4. The aortic valve is normal in structure. Aortic valve regurgitation is not visualized. No aortic stenosis is present.    Assessment and Plan     #) parox AFib #) aflutter S/p fib, flutter ablation 07/2021 Recently had two AF episodes within days of each other. Both episodes were ~66minutes in duration with symptoms of palpitations. No chest pain, chest pressure. No further episodes At this time, will continue to monitor AF burden, can consider redo ablation vs AAD in the future Encouraged lifestyle modifications including regular physical activity   #) Hypercoag d/t parox afib CHA2DS2-VASc Score = at least 2 [CHF History: 0, HTN History: 1, Diabetes History: 0, Stroke History: 0, Vascular Disease History: 0, Age Score: 0, Gender Score: 1].  Therefore, the patient's annual risk of stroke is 2.2 %.    Stroke ppx - 5mg  eliquis, appropriately dosed No bleeding concerns   #) OSA Encouraged nightly CPAP use Continue to follow-up with neurology      Current medicines are reviewed at length with the patient today.   The patient does not have concerns regarding her medicines.  The following changes were made today:  none  Labs/ tests ordered today include:  Orders Placed This Encounter  Procedures   EKG 12-Lead     Disposition: Follow up with EP APP  in 3 months   Signed, Sherie Don, NP  08/18/23  3:24 PM  Electrophysiology [facility]

## 2023-08-19 ENCOUNTER — Encounter (INDEPENDENT_AMBULATORY_CARE_PROVIDER_SITE_OTHER): Payer: Self-pay | Admitting: Internal Medicine

## 2023-08-19 ENCOUNTER — Ambulatory Visit (INDEPENDENT_AMBULATORY_CARE_PROVIDER_SITE_OTHER): Admitting: Internal Medicine

## 2023-08-19 VITALS — BP 110/74 | HR 87 | Temp 98.1°F | Ht 66.0 in | Wt 217.0 lb

## 2023-08-19 DIAGNOSIS — Z6835 Body mass index (BMI) 35.0-35.9, adult: Secondary | ICD-10-CM

## 2023-08-19 DIAGNOSIS — G4733 Obstructive sleep apnea (adult) (pediatric): Secondary | ICD-10-CM | POA: Diagnosis not present

## 2023-08-19 DIAGNOSIS — E781 Pure hyperglyceridemia: Secondary | ICD-10-CM

## 2023-08-19 DIAGNOSIS — R7303 Prediabetes: Secondary | ICD-10-CM | POA: Insufficient documentation

## 2023-08-19 DIAGNOSIS — Z0289 Encounter for other administrative examinations: Secondary | ICD-10-CM

## 2023-08-19 DIAGNOSIS — I4891 Unspecified atrial fibrillation: Secondary | ICD-10-CM

## 2023-08-19 DIAGNOSIS — I10 Essential (primary) hypertension: Secondary | ICD-10-CM

## 2023-08-19 DIAGNOSIS — K760 Fatty (change of) liver, not elsewhere classified: Secondary | ICD-10-CM

## 2023-08-19 DIAGNOSIS — E66812 Obesity, class 2: Secondary | ICD-10-CM

## 2023-08-19 DIAGNOSIS — E785 Hyperlipidemia, unspecified: Secondary | ICD-10-CM | POA: Insufficient documentation

## 2023-08-19 NOTE — Progress Notes (Signed)
 Office: [phone]  /  Fax: [phone]   Initial Visit  [name] Ross was seen in clinic today to evaluate for obesity. She is interested in losing weight to improve overall health and reduce the risk of weight related complications. She presents today to review program treatment options, initial physical assessment, and evaluation.     Discussed the use of AI scribe software for clinical note transcription with the patient, who gave verbal consent to proceed.  History of Present Illness [name] Ross is a 58 year old female who presents for weight management. She was referred by Dr. Jarold Motto for weight management.  She has a BMI of 35 and is seeking to manage her weight to improve her overall health and energy levels. Her weight has fluctuated significantly, with notable gain following the birth of her first child in 27. She has been on Ozempic for two years, initially losing approximately 20 pounds, but has since hit a plateau. A history of depression following the deaths of her parents has contributed to decreased physical activity and energy levels.  She has a history of hypertension, managed with carvedilol 25 mg twice daily, losartan 50 mg twice daily, and triamterene hydrochlorothiazide. She experiences lightheadedness and dizziness upon standing, which she attributes to her blood pressure medication and possible dehydration.  She has atrial fibrillation and is on apixaban 5 mg twice daily. Her pulse is stable at 87 bpm.  She has obstructive sleep apnea and uses a CPAP machine nightly, though she is unsure of the severity of her condition.  She has been diagnosed with metabolic dysfunction-associated steatotic liver disease, identified through abdominal imaging showing hepatic steatosis. Her liver enzymes and platelet count are normal.  She has prediabetes and hypertriglyceridemia, with a history of high triglyceride levels. She is currently on fenofibrate and Ozempic. She  reports a preference for carbohydrates and acknowledges that her diet may contribute to her condition.  Her family history includes a mother and two sisters who were on the heavier side. She also reports a significant emotional impact from her father's suicide in 2006-08-23 and her mother's death in 08/23/2014, which affected her mental health and physical activity levels.       Past medical history includes:   Past Medical History:  Diagnosis Date   Allergy    Anal skin tag    Anemia    Anxiety    Atrial fibrillation (HCC)    Depression    Diabetes mellitus without complication (HCC)    pre DM   Diverticulosis    Diverticulosis, sigmoid    Dyslipidemia    GERD (gastroesophageal reflux disease)    Hemorrhoids    internal and external   History of diverticulitis    10/ 08/23/2014   History of galactorrhea    History of stress test    a. Pt reports h/o stress test and echo by cardiologist in [place] (no records in Epic).  Both reportedly nl.   Hyperlipidemia    Hypertension    IBS (irritable bowel syndrome)    Morbid obesity (HCC)    PONV (postoperative nausea and vomiting)    severe   Rash    elbows and behind knees   Restless leg syndrome    Sleep apnea    wears c-pap   Wears glasses      Objective:   BP 110/74   Pulse 87   Temp 98.1 F (36.7 C)   Ht 5\' 6"  (1.676 m)   Wt 217 lb (98.4  kg)   LMP 02/13/2015   SpO2 96%   BMI 35.02 kg/m  She was weighed on the bioimpedance scale: Body mass index is 35.02 kg/m.  Peak MVHQIO:962 , Body Fat%:45.4, Visceral Fat Rating:13, Weight trend over the last 12 months: Decreasing  General:  Alert, oriented and cooperative. Patient is in no acute distress.  Respiratory: Normal respiratory effort, no problems with respiration noted   Gait: able to ambulate independently  Mental Status: Normal mood and affect. Normal behavior. Normal judgment and thought content.   DIAGNOSTIC DATA REVIEWED:  BMET    Component Value Date/Time   NA 133 (L)  06/29/2022 1556   NA 141 07/11/2021 1332   K 3.5 06/29/2022 1556   CL 102 06/29/2022 1556   CO2 22 06/29/2022 1556   GLUCOSE 112 (H) 06/29/2022 1556   BUN 17 06/29/2022 1556   BUN 16 07/11/2021 1332   CREATININE 0.77 06/29/2022 1556   CALCIUM 9.4 06/29/2022 1556   GFRNONAA >60 06/29/2022 1556   GFRAA >60 12/20/2019 0502   Lab Results  Component Value Date   HGBA1C 5.9 (H) 04/06/2021   No results found for: "INSULIN" CBC    Component Value Date/Time   WBC 10.3 06/29/2022 1556   RBC 4.58 06/29/2022 1556   HGB 13.7 06/29/2022 1556   HGB 11.1 07/11/2021 1332   HCT 40.8 06/29/2022 1556   HCT 31.7 (L) 07/11/2021 1332   PLT 549 (H) 06/29/2022 1556   PLT 573 (H) 07/11/2021 1332   MCV 89.1 06/29/2022 1556   MCV 87 07/11/2021 1332   MCH 29.9 06/29/2022 1556   MCHC 33.6 06/29/2022 1556   RDW 12.8 06/29/2022 1556   RDW 13.2 07/11/2021 1332   Iron/TIBC/Ferritin/ %Sat No results found for: "IRON", "TIBC", "FERRITIN", "IRONPCTSAT" Lipid Panel     Component Value Date/Time   CHOL 204 (H) 12/20/2019 0502   TRIG 510 (H) 12/20/2019 0502   HDL 39 (L) 12/20/2019 0502   CHOLHDL 5.2 12/20/2019 0502   VLDL UNABLE TO CALCULATE IF TRIGLYCERIDE OVER 400 mg/dL 95/28/4132 4401   LDLCALC UNABLE TO CALCULATE IF TRIGLYCERIDE OVER 400 mg/dL 02/72/5366 4403   LDLDIRECT 66.8 12/20/2019 0502   Hepatic Function Panel     Component Value Date/Time   PROT 7.7 04/05/2021 1912   ALBUMIN 3.5 04/05/2021 1912   AST 22 04/05/2021 1912   ALT 14 04/05/2021 1912   ALKPHOS 32 (L) 04/05/2021 1912   BILITOT 0.7 04/05/2021 1912   BILIDIR <0.1 01/23/2007 0024   IBILI NOT CALCULATED 01/23/2007 0024      Component Value Date/Time   TSH 3.374 04/06/2021 0707     Assessment and Plan:   Essential hypertension  Atrial fibrillation with rapid ventricular response (HCC)  OSA (obstructive sleep apnea)  Hypertriglyceridemia  Prediabetes  Class 2 severe obesity with serious comorbidity and body mass  index (BMI) of 35.0 to 35.9 in adult, unspecified obesity type (HCC)  Metabolic dysfunction-associated steatotic liver disease (MASLD)    Assessment and Plan Assessment & Plan Obesity BMI is 35. She seeks weight management due to significant weight gain post-childbirth and life stressors, including parental loss. On Ozempic for two years, initial weight loss plateaued after one year. Obesity is a chronic, progressive disease influenced by genetics, environment, and life events. Motivated to lose weight to improve energy levels and overall health. Losing 10-15% of body weight may improve hypertension, sleep apnea, and liver disease. The weight management program focuses on nutrition, physical activity, behavioral changes, and medications, emphasizing sustainable  weight loss of 1-2 pounds per week. - Enroll in medically supervised weight management program - Implement a low carb, moderate protein nutrition plan - Schedule follow-up visits every three weeks for the first three months, then monthly - Complete a 12-page questionnaire on lifestyle, nutrition, mood, and sleep - Perform a metabolic rate assessment - Discuss nutrition, physical activity, behavioral changes, and medications in follow-up visits  We reviewed anthropometrics, biometrics, associated medical conditions and contributing factors with patient. she would benefit from a medically tailored reduced calorie nutrional plan based on her REE (resting energy expenditure), which will be determined by indirect calorimetry.  We will also assess for cardiometabolic risk and nutritional derangements via fasting labs at intake appointment.    Hypertension Essential hypertension managed with carvedilol, losartan, and triamterene hydrochlorothiazide. Adequate hydration is emphasized to prevent dehydration and associated symptoms. Weight loss may improve blood pressure control. - Continue current antihypertensive regimen - Ensure adequate  hydration - Check renal parameters with intake labs  Atrial Fibrillation Managed with apixaban. Pulse is 87 bpm on carvedilol. Weight loss may contribute to better management of atrial fibrillation. - Continue apixaban 5 mg twice daily  Obstructive Sleep Apnea Moderate obstructive sleep apnea, compliant with CPAP therapy. Weight loss may reduce the apnea-hypopnea index (AHI). - Continue CPAP therapy  Hypertriglyceridemia Managed with fenofibrate. Weight loss and dietary changes may improve lipid levels. - Continue fenofibrate - Check fasting lipid profile with intake labs  Prediabetes Prediabetes managed with Ozempic. Initial weight loss plateaued. Weight loss and dietary changes may improve glycemic control. - Continue Ozempic - Check fasting insulin levels, hemoglobin A1c, and fasting blood glucose - Implement a low carb, moderate protein nutrition plan  Metabolic Dysfunction Associated Steatotic Liver Disease Hepatic steatosis on abdominal imaging with normal liver enzymes and platelet count. Weight loss and dietary changes may improve liver condition. - Implement a diet low in saturated fats and simple sugars - Continue treatment with GLP-1 receptor agonist  General Health Maintenance Advised on lifestyle modifications, including nutrition and physical activity, to improve overall health and manage chronic conditions. Emphasizes sustainable weight loss and potential for reducing medication burden through lifestyle changes. - Discuss cardiovascular and cancer risk in follow-up visits - Provide anticipatory guidance on behavior and lifestyle changes  Follow-up Scheduled for follow-up visits to monitor progress and adjust the treatment plan as needed. Includes comprehensive assessment of metabolic rate and lifestyle factors. - Schedule second visit for 60 minutes and third visit for 40 minutes - Fast for at least 8 hours before the second visit - Arrive an hour early for  metabolic rate assessment and possible EKG        Obesity Treatment / Action Plan:  Patient will work on garnering support from family and friends to begin weight loss journey. Will work on eliminating or reducing the presence of highly palatable, calorie dense foods in the home. Will complete provided nutritional and psychosocial assessment questionnaire before the next appointment. Will be scheduled for indirect calorimetry to determine resting energy expenditure in a fasting state.  This will allow [place] to create a reduced calorie, high-protein meal plan to promote loss of fat mass while preserving muscle mass. Counseled on the health benefits of losing 5%-15% of total body weight. Was counseled on nutritional approaches to weight loss and benefits of reducing processed foods and consuming plant-based foods and high quality protein as part of nutritional weight management. Was counseled on pharmacotherapy and role as an adjunct in weight management.   Obesity Education  Performed Today:  She was weighed on the bioimpedance scale and results were discussed and documented in the synopsis.  We discussed obesity as a disease and the importance of a more detailed evaluation of all the factors contributing to the disease.  We discussed the importance of long term lifestyle changes which include nutrition, exercise and behavioral modifications as well as the importance of customizing this to her specific health and social needs.  We discussed the benefits of reaching a healthier weight to alleviate the symptoms of existing conditions and reduce the risks of the biomechanical, metabolic and psychological effects of obesity.  [name] Most Dobratz appears to be in the action stage of change and states they are ready to start intensive lifestyle modifications and behavioral modifications.  I have spent 45 minutes in the care of the patient today including: preparing to see patient (e.g. review and  interpretation of tests, old notes ), obtaining and/or reviewing separately obtained history, performing a medically appropriate examination or evaluation, counseling and educating the patient, documenting clinical information in the electronic or other health care record, and independently interpreting results and communicating results to the patient, family, or caregiver   Reviewed by clinician on day of visit: allergies, medications, problem list, medical history, surgical history, family history, social history, and previous encounter notes pertinent to obesity diagnosis.   Worthy Rancher, MD

## 2023-08-22 ENCOUNTER — Telehealth: Payer: Self-pay | Admitting: Cardiology

## 2023-08-22 NOTE — Telephone Encounter (Signed)
    Patient s/p AF ablation with PVI 2023 has recently experienced isolated episodes of afib, ~10 min in length. She called today to report episode of elevated HR of 138bpm. Episode onset was 1230pm today, resolved by 0100pm. She felt lightheaded, jittery, short of breath with rapid HR. Symptoms have since resolved. Per patient, her [employer] device didn't report afib but rather stated tachycardia. Patient has sent transmissions via MyChart message that I am not able to see. Will forward this message to patient's EP team, consideration of definitive monitoring to assess afib burden.    Perlie Gold, PA-C

## 2023-08-23 NOTE — Telephone Encounter (Signed)
 Spoke with the patient and scheduled her for a follow up appointment with Dr. Lalla Brothers. On 5/7

## 2023-08-23 NOTE — Assessment & Plan Note (Signed)
 Her blood pressure is well-controlled.Essential hypertension managed with carvedilol, losartan, and triamterene hydrochlorothiazide. Adequate hydration is emphasized to prevent dehydration and associated symptoms. Weight loss may improve blood pressure control. - Continue current antihypertensive regimen - Ensure adequate hydration - Check renal parameters with intake labs

## 2023-08-27 ENCOUNTER — Other Ambulatory Visit: Payer: Self-pay | Admitting: Adult Health

## 2023-09-02 ENCOUNTER — Ambulatory Visit: Attending: Cardiology | Admitting: Cardiology

## 2023-09-02 VITALS — BP 110/70 | HR 86 | Ht 66.0 in | Wt 226.1 lb

## 2023-09-02 DIAGNOSIS — I483 Typical atrial flutter: Secondary | ICD-10-CM | POA: Diagnosis not present

## 2023-09-02 DIAGNOSIS — I480 Paroxysmal atrial fibrillation: Secondary | ICD-10-CM

## 2023-09-02 DIAGNOSIS — Z79899 Other long term (current) drug therapy: Secondary | ICD-10-CM

## 2023-09-02 DIAGNOSIS — D6869 Other thrombophilia: Secondary | ICD-10-CM

## 2023-09-02 MED ORDER — FLECAINIDE ACETATE 50 MG PO TABS: 50.0000 mg | ORAL_TABLET | Freq: Two times a day (BID) | ORAL | 3 refills | Status: DC

## 2023-09-02 NOTE — Progress Notes (Signed)
 Electrophysiology Clinic Note    Date:  09/02/2023  Patient ID:  [name], Ross 1965/09/22, MRN [id number] PCP:  Garlan Fillers, MD  Cardiologist:  Debbe Odea, MD Electrophysiologist: Lanier Prude, MD   Discussed the use of AI scribe software for clinical note transcription with the patient, who gave verbal consent to proceed.   Patient Profile    Chief Complaint: Afib follow-up  History of Present Illness: [name] Ross is a 58 y.o. female with PMH notable for parox afib, aflutter, HTN, OSA; seen today for Lanier Prude, MD for routine electrophysiology followup.   She is s/p AF ablation with PVI, posterior wall, and CTI on 07/2021 by Dr. Lalla Brothers. She last saw Dr. Lalla Brothers 08/2022 at which time was having intermittent skipped beats. [employer] reported them as PVCs. Her coreg was continued.  I saw her two week ago for routine follow-up where she had two AF episodes within days of each other. At the time she requested to continue to monitor AF. She sent multiple mychart messages since that time with rhythms concerning for AF and requested sooner follow-up.   On follow-up today, she states that her palpitations have lessened since schedule today's appt, but still happening. Often she has palps at night when trying to sleep. She requests to started AAD medication. She is having intermittent dizziness and questions whether her BP is causing this, or heart rhythm. She checks BP very rarely at home.   She continues to take eliquis BID, no bleeding concerns.   she denies chest pain, dyspnea, PND, orthopnea, nausea, vomiting, dizziness, syncope, edema, weight gain, or early satiety.      Arrhythmia/Device History Flecainide - stopped 2023     ROS:  Please see the history of present illness. All other systems are reviewed and otherwise negative.    Physical Exam    VS:  BP 110/70 (BP Location: Left Arm, Patient Position: Sitting, Cuff Size: Normal)   Pulse  86   Ht 5\' 6"  (1.676 m)   Wt 226 lb 2 oz (102.6 kg)   LMP 02/13/2015   SpO2 97%   BMI 36.50 kg/m  BMI: Body mass index is 36.5 kg/m.  Wt Readings from Last 3 Encounters:  09/02/23 226 lb 2 oz (102.6 kg)  08/19/23 217 lb (98.4 kg)  08/18/23 221 lb 6.4 oz (100.4 kg)     GEN- The patient is well appearing, alert and oriented x 3 today.   Lungs- Clear to ausculation bilaterally, normal work of breathing.  Heart- Regular rate and rhythm, no murmurs, rubs or gallops Extremities- No peripheral edema, warm, dry    Studies Reviewed   Previous EP, cardiology notes.    EKG is not ordered. Personal review of EKG from  08/18/2023  shows:  SR at 86, low voltage PR , QRS 76, QT 384       Long term monitor, 07/02/2022 HR 70 - 197 bpm, average 82 bpm. 1 nonsustained SVT, longest 4 beats. Rare supraventricular and ventricular ectopy. No atrial fibrillation. All symptom trigger episodes correspond to sinus rhythm with PAC or PVC.  Cardiac CTA, 07/24/2021 1. Normal coronary calcium score of 0. Patient is low risk for coronary events. 2. Normal coronary origin with right dominance.  3. No evidence of CAD.  4. CAD-RADS 0. Consider non-atherosclerotic causes of chest pain.  TTE, 04/07/2021  1. Left ventricular ejection fraction, by estimation, is 60 to 65%. The left ventricle has normal function. The left  ventricle has no regional wall motion abnormalities. There is mild asymmetric left ventricular hypertrophy of the basal-septal segment. Left ventricular diastolic parameters are indeterminate.   2. Right ventricular systolic function is normal. The right ventricular size is normal. Tricuspid regurgitation signal is inadequate for assessing PA pressure.   3. The mitral valve is normal in structure. Trivial mitral valve regurgitation. No evidence of mitral stenosis.   4. The aortic valve is normal in structure. Aortic valve regurgitation is not visualized. No aortic stenosis is present.     Assessment and Plan     #) parox AFib #) aflutter S/p fib, flutter ablation 07/2021 Discussed AAD options, specifically flecainide, propafenone, and multaq. Given that she tolerated flecainide well in the past, will start 50mg  flecainide BID Continue 25mg  coreg BID ETT in 1-2 weeks Consider redo ablation   #) Hypercoag d/t parox afib CHA2DS2-VASc Score = at least 2 [CHF History: 0, HTN History: 1, Diabetes History: 0, Stroke History: 0, Vascular Disease History: 0, Age Score: 0, Gender Score: 1].  Therefore, the patient's annual risk of stroke is 2.2 %.    Stroke ppx - 5mg  eliquis, appropriately dosed No bleeding concerns Stop PRN ibuprofen use  #) HTN At goal today,  but patient intermittently having dizziness Recommended she monitor BP 2-3 times per week and record measurements. Bring BP log to follow-up appts Continue coreg as above Continue 1/2 pill of 37-24 maxzide at this time   #) OSA Encouraged nightly CPAP use Continue to follow-up with neurology      Informed Consent   Shared Decision Making/Informed Consent The risks [chest pain, shortness of breath, cardiac arrhythmias, dizziness, blood pressure fluctuations, myocardial infarction, stroke/transient ischemic attack, and life-threatening complications (estimated to be 1 in 10,000)], benefits (risk stratification, diagnosing coronary artery disease, treatment guidance) and alternatives of an exercise tolerance test were discussed in detail with Ms. Wenberg and she agrees to proceed.      Current medicines are reviewed at length with the patient today.   The patient does not have concerns regarding her medicines.  The following changes were made today:  none  Labs/ tests ordered today include:  Orders Placed This Encounter  Procedures   Basic metabolic panel with GFR   Magnesium   EXERCISE TOLERANCE TEST (ETT)     Disposition: Follow up with EP APP  in 3 months   Signed, Sherie Don, NP  09/02/23  1:07  PM  Electrophysiology [facility]

## 2023-09-02 NOTE — Patient Instructions (Signed)
 Medication Instructions:  Your physician recommends the following medication changes.   START TAKING: Flecainide 50 mg twice daily   *If you need a refill on your cardiac medications before your next appointment, please call your pharmacy*  Lab Work: Your provider would like for you to have following labs drawn today BMP, Mag  If you have labs (blood work) drawn today and your tests are completely normal, you will receive your results only by: MyChart Message (if you have MyChart) OR A paper copy in the mail If you have any lab test that is abnormal or we need to change your treatment, we will call you to review the results.  Testing/Procedures: Your provider has ordered a exercise tolerance test. This test will evaluate the blood supply to your heart muscle during periods of exercise and rest. For this test, you will raise your heart rate by walking on a treadmill at different levels.   you may eat a light breakfast/ lunch prior to your procedure no caffeine for 24 hours prior to your test (coffee, tea, soft drinks, or chocolate)  no smoking/ vaping for 4 hours prior to your test you may take your regular medications the day of your test except for:   - hold Carvedilol (COREG)  bring any inhalers with you to your test wear comfortable clothing & tennis/ non-skid shoes to walk on the treadmill  This will take place at [address] [facility])  [place] [address]   Follow-Up: At [facility], you and your health needs are our priority.  As part of our continuing mission to provide you with exceptional heart care, our providers are all part of one team.  This team includes your primary Cardiologist (physician) and Advanced Practice Providers or APPs (Physician Assistants and Nurse Practitioners) who all work together to provide you with the care you need, when you need it.  Your next appointment:   May 7th at 1100 AM  Provider:   Steffanie Dunn MD  We  recommend signing up for the patient portal called "MyChart".  Sign up information is provided on this After Visit Summary.  MyChart is used to connect with patients for Virtual Visits (Telemedicine).  Patients are able to view lab/test results, encounter notes, upcoming appointments, etc.  Non-urgent messages can be sent to your provider as well.   To learn more about what you can do with MyChart, go to [email or web address].

## 2023-09-03 LAB — BASIC METABOLIC PANEL WITH GFR
BUN/Creatinine Ratio: 33 — ABNORMAL HIGH (ref 9–23)
BUN: 24 mg/dL (ref 6–24)
CO2: 22 mmol/L (ref 20–29)
Calcium: 9.6 mg/dL (ref 8.7–10.2)
Chloride: 101 mmol/L (ref 96–106)
Creatinine, Ser: 0.72 mg/dL (ref 0.57–1.00)
Glucose: 97 mg/dL (ref 70–99)
Potassium: 4.5 mmol/L (ref 3.5–5.2)
Sodium: 139 mmol/L (ref 134–144)
eGFR: 97 mL/min/{1.73_m2} (ref >59)

## 2023-09-03 LAB — MAGNESIUM: Magnesium: 1.7 mg/dL (ref 1.6–2.3)

## 2023-09-07 ENCOUNTER — Other Ambulatory Visit: Payer: Self-pay

## 2023-09-07 DIAGNOSIS — I480 Paroxysmal atrial fibrillation: Secondary | ICD-10-CM

## 2023-09-07 MED ORDER — APIXABAN 5 MG PO TABS: 5.0000 mg | ORAL_TABLET | Freq: Two times a day (BID) | ORAL | 5 refills | Status: DC

## 2023-09-07 NOTE — Telephone Encounter (Signed)
 Prescription refill request for Eliquis received. Indication:afib Last office visit:4/25 Scr:0.72  4/25 Age: 58 Weight:102.6  kg  Prescription refilled

## 2023-09-14 ENCOUNTER — Telehealth: Payer: Self-pay | Admitting: Cardiology

## 2023-09-14 ENCOUNTER — Ambulatory Visit
Admission: RE | Admit: 2023-09-14 | Discharge: 2023-09-14 | Disposition: A | Discharge status: Home / Self Care | Source: Ambulatory Visit | Attending: Cardiology | Admitting: Cardiology

## 2023-09-14 DIAGNOSIS — R0600 Dyspnea, unspecified: Secondary | ICD-10-CM | POA: Diagnosis not present

## 2023-09-14 DIAGNOSIS — R5383 Other fatigue: Secondary | ICD-10-CM | POA: Insufficient documentation

## 2023-09-14 DIAGNOSIS — Z79899 Other long term (current) drug therapy: Secondary | ICD-10-CM | POA: Diagnosis not present

## 2023-09-14 LAB — EXERCISE TOLERANCE TEST
Angina Index: 0
Duke Treadmill Score: 6
Exercise duration (min): 6 min
Exercise duration (sec): 0 s
MPHR: 162 {beats}/min
Peak HR: 112 {beats}/min
Percent HR: 69 %
Rest HR: 82 {beats}/min
ST Depression (mm): 0 mm

## 2023-09-14 NOTE — Telephone Encounter (Signed)
 Left voicemail message to have wife call us  back.

## 2023-09-14 NOTE — Telephone Encounter (Signed)
 Number is not in service.

## 2023-09-14 NOTE — Telephone Encounter (Signed)
 Received message from After Hours Answering service that patient was in afib for 2 hours. I dont see where she spoke to the on call provider so just called to verify she has spoken with someone. Patient did not answer, I left her a VM. FYI

## 2023-09-16 NOTE — Telephone Encounter (Signed)
 Sent mychart message- as she review results.  Will check to see how AFIB is doing. - see mychart message.   Thank you!   *will remove from triage pool*

## 2023-09-16 NOTE — Telephone Encounter (Signed)
 Still advising number is not in service.  Unable to reach patient.

## 2023-09-19 ENCOUNTER — Other Ambulatory Visit: Payer: Self-pay

## 2023-09-19 ENCOUNTER — Telehealth: Payer: Self-pay | Admitting: Home Health

## 2023-09-19 ENCOUNTER — Emergency Department
Admission: EM | Admit: 2023-09-19 | Discharge: 2023-09-19 | Disposition: A | Discharge status: Home / Self Care | Attending: Emergency Medicine | Admitting: Emergency Medicine

## 2023-09-19 ENCOUNTER — Emergency Department

## 2023-09-19 DIAGNOSIS — Z7901 Long term (current) use of anticoagulants: Secondary | ICD-10-CM | POA: Diagnosis not present

## 2023-09-19 DIAGNOSIS — R42 Dizziness and giddiness: Secondary | ICD-10-CM

## 2023-09-19 DIAGNOSIS — E119 Type 2 diabetes mellitus without complications: Secondary | ICD-10-CM | POA: Diagnosis not present

## 2023-09-19 DIAGNOSIS — I10 Essential (primary) hypertension: Secondary | ICD-10-CM | POA: Insufficient documentation

## 2023-09-19 DIAGNOSIS — I4891 Unspecified atrial fibrillation: Secondary | ICD-10-CM | POA: Diagnosis not present

## 2023-09-19 LAB — CBC
HCT: 40.9 % (ref 36.0–46.0)
Hemoglobin: 13.9 g/dL (ref 12.0–15.0)
MCH: 30 pg (ref 26.0–34.0)
MCHC: 34 g/dL (ref 30.0–36.0)
MCV: 88.1 fL (ref 80.0–100.0)
Platelets: 496 10*3/uL — ABNORMAL HIGH (ref 150–400)
RBC: 4.64 MIL/uL (ref 3.87–5.11)
RDW: 13.9 % (ref 11.5–15.5)
WBC: 10 10*3/uL (ref 4.0–10.5)
nRBC: 0 % (ref 0.0–0.2)

## 2023-09-19 LAB — TROPONIN I (HIGH SENSITIVITY): Troponin I (High Sensitivity): 4 ng/L (ref <18)

## 2023-09-19 LAB — BASIC METABOLIC PANEL WITH GFR
Anion gap: 8 (ref 5–15)
BUN: 27 mg/dL — ABNORMAL HIGH (ref 6–20)
CO2: 22 mmol/L (ref 22–32)
Calcium: 9.3 mg/dL (ref 8.9–10.3)
Chloride: 102 mmol/L (ref 98–111)
Creatinine, Ser: 0.8 mg/dL (ref 0.44–1.00)
GFR, Estimated: >60 mL/min (ref >60)
Glucose, Bld: 111 mg/dL — ABNORMAL HIGH (ref 70–99)
Potassium: 3.8 mmol/L (ref 3.5–5.1)
Sodium: 132 mmol/L — ABNORMAL LOW (ref 135–145)

## 2023-09-19 LAB — PROTIME-INR
INR: 1.1 (ref 0.8–1.2)
Prothrombin Time: 14.2 s (ref 11.4–15.2)

## 2023-09-19 LAB — MAGNESIUM: Magnesium: 1.6 mg/dL — ABNORMAL LOW (ref 1.7–2.4)

## 2023-09-19 MED ORDER — FLECAINIDE ACETATE 100 MG PO TABS: 100.0000 mg | ORAL_TABLET | Freq: Two times a day (BID) | ORAL | 0 refills | Status: DC

## 2023-09-19 MED ORDER — MAGNESIUM SULFATE 2 GM/50ML IV SOLN
2.0000 g | Freq: Once | INTRAVENOUS | Status: AC
  Administered 2023-09-19: 2 g via INTRAVENOUS
  Filled 2023-09-19: qty 50

## 2023-09-19 MED ORDER — DILTIAZEM HCL-DEXTROSE 125-5 MG/125ML-% IV SOLN (PREMIX)
5.0000 mg/h | INTRAVENOUS | Status: DC
  Filled 2023-09-19: qty 125

## 2023-09-19 MED ORDER — DILTIAZEM HCL 25 MG/5ML IV SOLN
10.0000 mg | Freq: Once | INTRAVENOUS | Status: DC
  Filled 2023-09-19: qty 5

## 2023-09-19 NOTE — ED Provider Notes (Signed)
 [facility] Provider Note    Event Date/Time   First MD Initiated Contact with Patient 09/19/23 1501     (approximate)   History   Chief Complaint Atrial Fibrillation and Dizziness   HPI  [name] Ross is a 58 y.o. female with past medical history of hypertension, hyperlipidemia, diabetes, and atrial fibrillation on Eliquis  who presents to the ED complaining of atrial fibrillation.  Patient reports that over the past 2 months she has been dealing with intermittent episodes of atrial fibrillation including feeling like her heart is racing with irregular heartbeat.  She has been following with cardiology for this, was recently restarted on flecainide  but has continued to have the episodes.  She feels like she went into atrial fibrillation again today, however describes more dizziness and lightheadedness along with difficulty breathing that she has had compared to prior episodes.  She denies any associated pain in her chest, has not had any pain or swelling in her legs.  She does report concerned that she may have missed recent doses of Eliquis .     Physical Exam   Triage Vital Signs: ED Triage Vitals  Encounter Vitals Group     BP 09/19/23 1425 (!) 128/108     Systolic BP Percentile --      Diastolic BP Percentile --      Pulse Rate 09/19/23 1423 (!) 151     Resp 09/19/23 1423 20     Temp 09/19/23 1425 98.6 F (37 C)     Temp Source 09/19/23 1423 Oral     SpO2 09/19/23 1423 100 %     Weight 09/19/23 1426 220 lb (99.8 kg)     Height 09/19/23 1426 5\' 6"  (1.676 m)     Head Circumference --      Peak Flow --      Pain Score 09/19/23 1421 0     Pain Loc --      Pain Education --      Exclude from Growth Chart --     Most recent vital signs: Vitals:   09/19/23 1425 09/19/23 1500  BP: (!) 128/108 110/83  Pulse:  (!) 58  Resp:  17  Temp: 98.6 F (37 C)   SpO2:  95%    Constitutional: Alert and oriented. Eyes: Conjunctivae are normal. Head:  Atraumatic. Nose: No congestion/rhinnorhea. Mouth/Throat: Mucous membranes are moist.  Cardiovascular: Tachycardic, irregularly irregular rhythm. Grossly normal heart sounds.  2+ radial pulses bilaterally. Respiratory: Normal respiratory effort.  No retractions. Lungs CTAB. Gastrointestinal: Soft and nontender. No distention. Musculoskeletal: No lower extremity tenderness nor edema.  Neurologic:  Normal speech and language. No gross focal neurologic deficits are appreciated.    ED Results / Procedures / Treatments   Labs (all labs ordered are listed, but only abnormal results are displayed) Labs Reviewed  BASIC METABOLIC PANEL WITH GFR - Abnormal; Notable for the following components:      Result Value   Sodium 132 (*)    Glucose, Bld 111 (*)    BUN 27 (*)    All other components within normal limits  CBC - Abnormal; Notable for the following components:   Platelets 496 (*)    All other components within normal limits  MAGNESIUM  - Abnormal; Notable for the following components:   Magnesium  1.6 (*)    All other components within normal limits  PROTIME-INR  TROPONIN I (HIGH SENSITIVITY)     EKG  ED ECG REPORT I, Twilla Galea, the attending  physician, personally viewed and interpreted this ECG.   Date: 09/19/2023  EKG Time: 14:21  Rate: 151  Rhythm: atrial fibrillation  Axis: LAD  Intervals:none  ST&T Change: None  ED ECG REPORT I, Twilla Galea, the attending physician, personally viewed and interpreted this ECG.   Date: 09/19/2023  EKG Time: 15:48  Rate: 84  Rhythm: normal sinus rhythm  Axis: Normal  Intervals:none  ST&T Change: None   RADIOLOGY Chest x-Notz reviewed and interpreted by me with no infiltrate, edema, or effusion.  PROCEDURES:  Critical Care performed: Yes, see critical care procedure note(s)  .Critical Care  Performed by: Twilla Galea, MD Authorized by: Twilla Galea, MD   Critical care provider statement:    Critical care  time (minutes):  30   Critical care time was exclusive of:  Separately billable procedures and treating other patients and teaching time   Critical care was necessary to treat or prevent imminent or life-threatening deterioration of the following conditions:  Cardiac failure   Critical care was time spent personally by me on the following activities:  Development of treatment plan with patient or surrogate, discussions with consultants, evaluation of patient's response to treatment, examination of patient, ordering and review of laboratory studies, ordering and review of radiographic studies, ordering and performing treatments and interventions, pulse oximetry, re-evaluation of patient's condition and review of old charts   I assumed direction of critical care for this patient from another provider in my specialty: no     Care discussed with: admitting provider      MEDICATIONS ORDERED IN ED: Medications  magnesium  sulfate IVPB 2 g 50 mL (2 g Intravenous New Bag/Given 09/19/23 1551)     IMPRESSION / MDM / ASSESSMENT AND PLAN / ED COURSE  I reviewed the triage vital signs and the nursing notes.                              58 y.o. female with past medical history of hypertension, hyperlipidemia, diabetes, and atrial fibrillation on Eliquis  who presents to the ED complaining of intermittent atrial fibrillation over the past couple of months, more persistent today with lightheadedness and shortness of breath.  Patient's presentation is most consistent with acute presentation with potential threat to life or bodily function.  Differential diagnosis includes, but is not limited to, atrial fibrillation, ACS, anemia, electrolyte abnormality, AKI, pneumonia, CHF.  Patient nontoxic-appearing and in no acute distress, vital signs remarkable for tachycardia but otherwise reassuring.  EKG shows atrial fibrillation with RVR, no ischemic changes noted.  Cardioversion was discussed with the patient,  however she is concerned that she may have missed some recent doses of Eliquis  as she does not prepare her pills herself and doses have been missing previously.  Given this, we will attempt to control heart rate with IV diltiazem .  Labs with some hypomagnesemia that we will replete, no significant anemia, AKI, leukocytosis, or troponin elevation.  Chest x-Goetting pending at this time.  Patient converted to normal sinus rhythm prior to initiation of diltiazem .  She states that symptoms have resolved on reassessment, case discussed with Dr. Junnie Olives of cardiology, who recommends increasing dose of flecainide  to 100 mg twice daily.  Patient appropriate for discharge home with cardiology follow-up this coming week, was counseled to return to the ED for new or worsening symptoms.  Patient agrees with plan.      FINAL CLINICAL IMPRESSION(S) / ED DIAGNOSES   Final diagnoses:  Atrial fibrillation with RVR (HCC)  Dizziness     Rx / DC Orders   ED Discharge Orders          Ordered    Ambulatory referral to Cardiology        09/19/23 1626    flecainide  (TAMBOCOR ) 100 MG tablet  2 times daily        09/19/23 1626             Note:  This document was prepared using Dragon voice recognition software and may include unintentional dictation errors.   Twilla Galea, MD 09/19/23 [phone]

## 2023-09-19 NOTE — Telephone Encounter (Signed)
 Patient called after hour line and reporting she has been in atrial fibrillation on and off over the past 2 to 3 weeks.  She saw EP team 4/10 and was started on flecainide  50 mg twice daily.  She felt overall limited improvement.  She noted herself back in atrial fibrillation since yesterday, episode seems fairly persistent.  She had used a Cytogeneticist which revealed A-fib with heart rate 165 today.  She noted her heart rate has been up to 180s at times.  She feels increasingly short of breath, jittery, dizzy.  She states her blood pressure this morning was 136/97.  Advised patient go to the ER for further evaluation and possible rate control.  Curbside Dr. Lawana Pray who has agreed with this recommendation.  Patient further ask if she converted to sinus rhythm, can she wait for an office appointment.  Advised patient that if she converts to sinus rhythm and feels improved, she may call the office tomorrow for a follow-up visit.

## 2023-09-19 NOTE — ED Triage Notes (Signed)
 Pt to ED for a fib since yesterday, hx of same. Dissy all day today. Takes carvedilol , flecainide , eliquis  with no missed doses. Has had ablation for same. EKG shows undetermined rhythm, HR 151. Skin is dry, resp unlabored.

## 2023-09-22 ENCOUNTER — Encounter (INDEPENDENT_AMBULATORY_CARE_PROVIDER_SITE_OTHER): Payer: Self-pay | Admitting: Family Medicine

## 2023-09-22 ENCOUNTER — Ambulatory Visit (INDEPENDENT_AMBULATORY_CARE_PROVIDER_SITE_OTHER): Admitting: Family Medicine

## 2023-09-22 VITALS — BP 108/71 | HR 81 | Temp 97.9°F | Ht 66.0 in | Wt 228.0 lb

## 2023-09-22 DIAGNOSIS — E559 Vitamin D deficiency, unspecified: Secondary | ICD-10-CM | POA: Diagnosis not present

## 2023-09-22 DIAGNOSIS — E66812 Obesity, class 2: Secondary | ICD-10-CM | POA: Diagnosis not present

## 2023-09-22 DIAGNOSIS — R7303 Prediabetes: Secondary | ICD-10-CM

## 2023-09-22 DIAGNOSIS — I480 Paroxysmal atrial fibrillation: Secondary | ICD-10-CM | POA: Diagnosis not present

## 2023-09-22 DIAGNOSIS — Z1331 Encounter for screening for depression: Secondary | ICD-10-CM

## 2023-09-22 DIAGNOSIS — Z6836 Body mass index (BMI) 36.0-36.9, adult: Secondary | ICD-10-CM | POA: Diagnosis not present

## 2023-09-22 DIAGNOSIS — Z8759 Personal history of other complications of pregnancy, childbirth and the puerperium: Secondary | ICD-10-CM | POA: Insufficient documentation

## 2023-09-22 DIAGNOSIS — R5383 Other fatigue: Secondary | ICD-10-CM

## 2023-09-22 DIAGNOSIS — R0602 Shortness of breath: Secondary | ICD-10-CM | POA: Diagnosis not present

## 2023-09-22 DIAGNOSIS — E782 Mixed hyperlipidemia: Secondary | ICD-10-CM

## 2023-09-22 DIAGNOSIS — G4733 Obstructive sleep apnea (adult) (pediatric): Secondary | ICD-10-CM | POA: Diagnosis not present

## 2023-09-22 DIAGNOSIS — I10 Essential (primary) hypertension: Secondary | ICD-10-CM | POA: Diagnosis not present

## 2023-09-22 DIAGNOSIS — E785 Hyperlipidemia, unspecified: Secondary | ICD-10-CM

## 2023-09-22 NOTE — Progress Notes (Signed)
 Chief Complaint:  Obesity   Subjective:  [name] Ross (MR# [id number]) is a 57 y.o. female who presents for evaluation and treatment of obesity and related comorbidities.   [name] Ross is currently in the action stage of change and ready to dedicate time achieving and maintaining a healthier weight. [name] Ross is interested in becoming our patient and working on intensive lifestyle modifications including (but not limited to) diet and exercise for weight loss.  [name] Ross has been struggling with her weight. She has been unsuccessful in either losing weight, maintaining weight loss, or reaching her healthy weight goal.  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  Married, living at home with husband [name] Ross.  He is supportive or her, occasionally eats meals together and he will eat similar foods as patient.  She and her husband both skip meals frequently. Desired weight is 170lbs and last time she was that weight was prior to second pregnancy.  She has struggled with maintaining her weight loss throughout her whole life. Previously tried Weight Watchers and Delight Felts.  Lost on Weight Watchers but did not maintain.  Eating outside the home 3-4 times a week. Craves carbs like bread, pasta.  Food Recall: Diet Dr. Kathlene Paradise in the am to take medication.  May do protein shake and very occasionally does coffee.  Doesn't eat most of the day until dinner.  Dinner can be things like quesadilla- chicken, cheese and mayo, sour cream and spices. Ate that plus a cheese quesadilla.  Prior to dinner at [employer] crackers with peanut butter on them (whole pack of crackers).    Indirect Calorimeter completed today shows a RMR: 2650.  Her calculated basal metabolic rate is 7846 thus her basal metabolic rate is better than expected.  Other Fatigue Cymone admits to daytime somnolence and admits to waking up still tired. Patient has a history of symptoms of Epworth sleepiness scale. [name] Ross  generally gets 6 hours of sleep per night, and states that she has generally restless sleep. Snoring is not present with CPAP. Apneic episodes are present. Epworth Sleepiness Score is 15.   Shortness of Breath [name] Ross notes increasing shortness of breath with exercising and seems to be worsening over time with weight gain. She notes getting out of breath sooner with activity than she used to. This has not gotten worse recently. Creta denies shortness of breath at rest or orthopnea.  Depression Screen [name] Ross's Food and Mood (modified PHQ-9) score was 11.     09/22/2023    8:30 AM  Depression screen PHQ 2/9  Decreased Interest 0  Down, Depressed, Hopeless 0  PHQ - 2 Score 0  Altered sleeping 0  Tired, decreased energy 1  Change in appetite 2  Feeling bad or failure about yourself  0  Trouble concentrating 0  Moving slowly or fidgety/restless 0  Suicidal thoughts 0  PHQ-9 Score 3  Difficult doing work/chores Not difficult at all     Objective:  Vitals Temp: 97.9 F (36.6 C) BP: 108/71 Pulse Rate: 81 SpO2: 98 %   Anthropometric Measurements Height: 5\' 6"  (1.676 m) Weight: 228 lb (103.4 kg) BMI (Calculated): 36.82 Starting Weight: 228 lb Waist Measurement : 48 inches   Body Composition  Body Fat %: 47.1 % Fat Mass (lbs): 107.69 lbs Muscle Mass (lbs): 114.8 lbs Total Body Water (lbs): 84.2 lbs Visceral Fat Rating : 14   Other Clinical Data RMR: 2650 Fasting: yes Labs: yes Today's Visit #: 1 Starting Date: 09/22/23  EKG: Normal sinus rhythm, rate 84.  General: Cooperative, alert, well developed, in no acute distress. HEENT: Conjunctivae and lids unremarkable. Cardiovascular: Regular rhythm.  Lungs: Normal work of breathing. Neurologic: No focal deficits.   Lab Results  Component Value Date   CREATININE 0.80 09/19/2023   BUN 27 (H) 09/19/2023   NA 132 (L) 09/19/2023   K 3.8 09/19/2023   CL 102 09/19/2023   CO2 22 09/19/2023   Lab Results   Component Value Date   ALT 14 04/05/2021   AST 22 04/05/2021   ALKPHOS 32 (L) 04/05/2021   BILITOT 0.7 04/05/2021   Lab Results  Component Value Date   HGBA1C 5.9 (H) 04/06/2021   No results found for: "INSULIN" Lab Results  Component Value Date   TSH 3.374 04/06/2021   Lab Results  Component Value Date   CHOL 204 (H) 12/20/2019   HDL 39 (L) 12/20/2019   LDLCALC UNABLE TO CALCULATE IF TRIGLYCERIDE OVER 400 mg/dL 29/56/2130   LDLDIRECT 66.8 12/20/2019   TRIG 510 (H) 12/20/2019   CHOLHDL 5.2 12/20/2019   Lab Results  Component Value Date   WBC 10.0 09/19/2023   HGB 13.9 09/19/2023   HCT 40.9 09/19/2023   MCV 88.1 09/19/2023   PLT 496 (H) 09/19/2023   No results found for: "IRON", "TIBC", "FERRITIN"  Assessment and Plan:   Other Fatigue  [name] Ross does feel that her weight is causing her energy to be lower than it should be. Fatigue may be related to obesity, depression or many other causes. Labs will be ordered, and in the meanwhile, [name] Ross will focus on self care including making healthy food choices, increasing physical activity and focusing on stress reduction.  Shortness of Breath  [name] Ross does feel that she gets out of breath more easily that she used to when she exercises. [name] Ross's shortness of breath appears to be obesity related and exercise induced. She has agreed to work on weight loss and gradually increase exercise to treat her exercise induced shortness of breath. Will continue to monitor closely.   Problem List Items Addressed This Visit       Cardiovascular and Mediastinum   Essential hypertension   BP controlled on her medications.  She is on cozaar , coreg  and maxzide .  Sees cardiology.  Will order repeat CMP today      Paroxysmal atrial fibrillation (HCC)   Sees Dr. Junnie Olives and Dr. Marven Slimmer at [facility].  Just recently had an increase in flecainide  due to a. Fib.  She is on eliquis .  She can feel symptoms of atrial fibrillation when she  goes into that rhythm.          Respiratory   OSA (obstructive sleep apnea)   Has CPAP and wears it nightly.  Feels the CPAP does help her sleep.  She still has daytime somnolence.  Home sleep test done in 2023 showing  severe obstructive sleep apnea with a total AHI of 50.8/hour and O2 nadir of 78%. Moderate to loud snoring was detected. Ongoing treatment with positive airway pressure is highly recommended.         Other   Prediabetes   Last A1c of 5.9 in Epic.  She is on 2mg  weekly Ozempic .  Will repeat A1c and Insulin level today.      Hyperlipidemia   Saw [facility] in January for physical.  Last FLP in epic showing HDL low, significantly elevated triglycerides.  On lofibra.  FLP ordered today.      Vitamin D  deficiency   On OTC vitamin d.  Reports fatigue.  Vitamin D level ordered today.       H/O pre-eclampsia   Second pregnancy, preterm diagnosis and term delivery.  Already sees cardiology.      Other Visit Diagnoses       Other fatigue    -  Primary     SOBOE (shortness of breath on exertion)         Depression screening         Class 2 severe obesity due to excess calories with serious comorbidity and body mass index (BMI) of 36.0 to 36.9 in adult (HCC)         BMI 36.0-36.9,adult           [name] Ross is currently in the action stage of change and her goal is to continue with weight loss efforts. I recommend Chalet begin the structured treatment plan as follows:  She has agreed to Category 3 Plan  Exercise goals: All adults should avoid inactivity. Some activity is better than none, and adults who participate in any amount of physical activity, gain some health benefits.  Behavioral modification strategies:increasing lean protein intake, decreasing simple carbohydrates, meal planning and cooking strategies, keeping healthy foods in the home, and planning for success  She was informed of the importance of frequent follow-up visits to maximize her success  with intensive lifestyle modifications for her multiple health conditions. She was informed we would discuss her lab results at her next visit unless there is a critical issue that needs to be addressed sooner. Inari agreed to keep her next visit at the agreed upon time to discuss these results.  Labs ordered with plans to discuss at the next visit.   Attestation Statements:  Reviewed by clinician on day of visit: allergies, medications, problem list, medical history, surgical history, family history, social history, and previous encounter notes. This is the patient's first visit at [facility]. The patient's NEW PATIENT PACKET was reviewed at length. Included in the packet: current and past health history, medications, allergies, ROS, gynecologic history (women only), surgical history, family history, social history, weight history, weight loss surgery history (for those that have had weight loss surgery), nutritional evaluation, mood and food questionnaire, PHQ9, Epworth questionnaire, sleep habits questionnaire, patient life and health improvement goals questionnaire. These will all be scanned into the patient's chart under media.   During the visit, I independently reviewed the patient's EKG, bioimpedance scale results, and indirect calorimeter results. I used this information to tailor a meal plan for the patient that will help her to lose weight and will improve her obesity-related conditions going forward. I performed a medically necessary appropriate examination and/or evaluation. I discussed the assessment and treatment plan with the patient. The patient was provided an opportunity to ask questions and all were answered. The patient agreed with the plan and demonstrated an understanding of the instructions. Labs were ordered at this visit and will be reviewed at the next visit unless more critical results need to be addressed immediately. Clinical information was updated and  documented in the EMR.    Donaciano Frizzle, MD

## 2023-09-22 NOTE — Assessment & Plan Note (Addendum)
 Sees Dr. Junnie Olives and Dr. Marven Slimmer at [facility].  Just recently had an increase in flecainide  due to a. Fib.  She is on eliquis .  She can feel symptoms of atrial fibrillation when she goes into that rhythm.

## 2023-09-22 NOTE — Assessment & Plan Note (Signed)
 Has CPAP and wears it nightly.  Feels the CPAP does help her sleep.  She still has daytime somnolence.  Home sleep test done in 2023 showing  severe obstructive sleep apnea with a total AHI of 50.8/hour and O2 nadir of 78%. Moderate to loud snoring was detected. Ongoing treatment with positive airway pressure is highly recommended.

## 2023-09-22 NOTE — Assessment & Plan Note (Signed)
 Saw [facility] in January for physical.  Last FLP in epic showing HDL low, significantly elevated triglycerides.  On lofibra.  FLP ordered today.

## 2023-09-22 NOTE — Assessment & Plan Note (Addendum)
 BP controlled on her medications.  She is on cozaar , coreg  and maxzide .  Sees cardiology.  Will order repeat CMP today

## 2023-09-22 NOTE — Assessment & Plan Note (Signed)
 On OTC vitamin d.  Reports fatigue.  Vitamin D level ordered today.

## 2023-09-22 NOTE — Assessment & Plan Note (Signed)
 Second pregnancy, preterm diagnosis and term delivery.  Already sees cardiology.

## 2023-09-22 NOTE — Assessment & Plan Note (Signed)
 Last A1c of 5.9 in Epic.  She is on 2mg  weekly Ozempic .  Will repeat A1c and Insulin level today.

## 2023-09-23 LAB — COMPREHENSIVE METABOLIC PANEL WITH GFR
ALT: 10 IU/L (ref 0–32)
AST: 10 IU/L (ref 0–40)
Albumin: 4 g/dL (ref 3.8–4.9)
Alkaline Phosphatase: 41 IU/L — ABNORMAL LOW (ref 44–121)
BUN/Creatinine Ratio: 28 — ABNORMAL HIGH (ref 9–23)
BUN: 21 mg/dL (ref 6–24)
Bilirubin Total: <0.2 mg/dL (ref 0.0–1.2)
CO2: 21 mmol/L (ref 20–29)
Calcium: 9.7 mg/dL (ref 8.7–10.2)
Chloride: 101 mmol/L (ref 96–106)
Creatinine, Ser: 0.74 mg/dL (ref 0.57–1.00)
Globulin, Total: 2.4 g/dL (ref 1.5–4.5)
Glucose: 103 mg/dL — ABNORMAL HIGH (ref 70–99)
Potassium: 4.2 mmol/L (ref 3.5–5.2)
Sodium: 139 mmol/L (ref 134–144)
Total Protein: 6.4 g/dL (ref 6.0–8.5)
eGFR: 94 mL/min/{1.73_m2} (ref >59)

## 2023-09-23 LAB — CBC WITH DIFFERENTIAL/PLATELET
Basophils Absolute: 0 10*3/uL (ref 0.0–0.2)
Basos: 0 %
EOS (ABSOLUTE): 0.1 10*3/uL (ref 0.0–0.4)
Eos: 2 %
Hematocrit: 37.4 % (ref 34.0–46.6)
Hemoglobin: 12.2 g/dL (ref 11.1–15.9)
Immature Grans (Abs): 0.1 10*3/uL (ref 0.0–0.1)
Immature Granulocytes: 1 %
Lymphocytes Absolute: 2.1 10*3/uL (ref 0.7–3.1)
Lymphs: 23 %
MCH: 29.3 pg (ref 26.6–33.0)
MCHC: 32.6 g/dL (ref 31.5–35.7)
MCV: 90 fL (ref 79–97)
Monocytes Absolute: 0.4 10*3/uL (ref 0.1–0.9)
Monocytes: 5 %
Neutrophils Absolute: 6.4 10*3/uL (ref 1.4–7.0)
Neutrophils: 69 %
Platelets: 425 10*3/uL (ref 150–450)
RBC: 4.16 x10E6/uL (ref 3.77–5.28)
RDW: 13.1 % (ref 11.7–15.4)
WBC: 9.2 10*3/uL (ref 3.4–10.8)

## 2023-09-23 LAB — VITAMIN D 25 HYDROXY (VIT D DEFICIENCY, FRACTURES): Vit D, 25-Hydroxy: 30.5 ng/mL (ref 30.0–100.0)

## 2023-09-23 LAB — LIPID PANEL WITH LDL/HDL RATIO
Cholesterol, Total: 169 mg/dL (ref 100–199)
HDL: 59 mg/dL (ref >39)
LDL Chol Calc (NIH): 61 mg/dL (ref 0–99)
LDL/HDL Ratio: 1 ratio (ref 0.0–3.2)
Triglycerides: 319 mg/dL — ABNORMAL HIGH (ref 0–149)
VLDL Cholesterol Cal: 49 mg/dL — ABNORMAL HIGH (ref 5–40)

## 2023-09-23 LAB — THYROID PANEL WITH TSH
Free Thyroxine Index: 2.3 (ref 1.2–4.9)
T3 Uptake Ratio: 19 % — ABNORMAL LOW (ref 24–39)
T4, Total: 12.2 ug/dL — ABNORMAL HIGH (ref 4.5–12.0)
TSH: 2.08 u[IU]/mL (ref 0.450–4.500)

## 2023-09-23 LAB — VITAMIN B12: Vitamin B-12: 197 pg/mL — ABNORMAL LOW (ref 232–1245)

## 2023-09-23 LAB — FOLATE: Folate: 12.6 ng/mL (ref >3.0)

## 2023-09-23 LAB — HEMOGLOBIN A1C
Est. average glucose Bld gHb Est-mCnc: 120 mg/dL
Hgb A1c MFr Bld: 5.8 % — ABNORMAL HIGH (ref 4.8–5.6)

## 2023-09-23 LAB — INSULIN, RANDOM: INSULIN: 48.7 u[IU]/mL — ABNORMAL HIGH (ref 2.6–24.9)

## 2023-09-24 ENCOUNTER — Encounter: Payer: Self-pay | Admitting: Cardiology

## 2023-09-24 ENCOUNTER — Other Ambulatory Visit: Payer: Self-pay

## 2023-09-24 ENCOUNTER — Emergency Department

## 2023-09-24 ENCOUNTER — Observation Stay
Admission: EM | Admit: 2023-09-24 | Discharge: 2023-09-25 | Disposition: A | Discharge status: Home / Self Care | Attending: Internal Medicine | Admitting: Internal Medicine

## 2023-09-24 ENCOUNTER — Telehealth: Payer: Self-pay

## 2023-09-24 DIAGNOSIS — G4733 Obstructive sleep apnea (adult) (pediatric): Secondary | ICD-10-CM | POA: Diagnosis not present

## 2023-09-24 DIAGNOSIS — Z79899 Other long term (current) drug therapy: Secondary | ICD-10-CM | POA: Insufficient documentation

## 2023-09-24 DIAGNOSIS — Z7985 Long-term (current) use of injectable non-insulin antidiabetic drugs: Secondary | ICD-10-CM | POA: Diagnosis not present

## 2023-09-24 DIAGNOSIS — E785 Hyperlipidemia, unspecified: Secondary | ICD-10-CM | POA: Diagnosis not present

## 2023-09-24 DIAGNOSIS — G2581 Restless legs syndrome: Secondary | ICD-10-CM | POA: Diagnosis not present

## 2023-09-24 DIAGNOSIS — F418 Other specified anxiety disorders: Secondary | ICD-10-CM | POA: Diagnosis not present

## 2023-09-24 DIAGNOSIS — Z6836 Body mass index (BMI) 36.0-36.9, adult: Secondary | ICD-10-CM | POA: Insufficient documentation

## 2023-09-24 DIAGNOSIS — R0602 Shortness of breath: Secondary | ICD-10-CM | POA: Diagnosis not present

## 2023-09-24 DIAGNOSIS — I4891 Unspecified atrial fibrillation: Secondary | ICD-10-CM | POA: Diagnosis not present

## 2023-09-24 DIAGNOSIS — I10 Essential (primary) hypertension: Secondary | ICD-10-CM | POA: Diagnosis not present

## 2023-09-24 DIAGNOSIS — E669 Obesity, unspecified: Secondary | ICD-10-CM | POA: Diagnosis not present

## 2023-09-24 DIAGNOSIS — E119 Type 2 diabetes mellitus without complications: Secondary | ICD-10-CM | POA: Diagnosis not present

## 2023-09-24 DIAGNOSIS — R9431 Abnormal electrocardiogram [ECG] [EKG]: Secondary | ICD-10-CM | POA: Insufficient documentation

## 2023-09-24 DIAGNOSIS — Z7901 Long term (current) use of anticoagulants: Secondary | ICD-10-CM | POA: Insufficient documentation

## 2023-09-24 DIAGNOSIS — I700 Atherosclerosis of aorta: Secondary | ICD-10-CM | POA: Diagnosis not present

## 2023-09-24 DIAGNOSIS — I480 Paroxysmal atrial fibrillation: Secondary | ICD-10-CM

## 2023-09-24 LAB — CBC
HCT: 40.9 % (ref 36.0–46.0)
Hemoglobin: 13.3 g/dL (ref 12.0–15.0)
MCH: 29 pg (ref 26.0–34.0)
MCHC: 32.5 g/dL (ref 30.0–36.0)
MCV: 89.1 fL (ref 80.0–100.0)
Platelets: 479 10*3/uL — ABNORMAL HIGH (ref 150–400)
RBC: 4.59 MIL/uL (ref 3.87–5.11)
RDW: 14 % (ref 11.5–15.5)
WBC: 6.6 10*3/uL (ref 4.0–10.5)
nRBC: 0 % (ref 0.0–0.2)

## 2023-09-24 LAB — PROTIME-INR
INR: 1.1 (ref 0.8–1.2)
Prothrombin Time: 14.1 s (ref 11.4–15.2)

## 2023-09-24 LAB — BASIC METABOLIC PANEL WITH GFR
Anion gap: 12 (ref 5–15)
BUN: 29 mg/dL — ABNORMAL HIGH (ref 6–20)
CO2: 22 mmol/L (ref 22–32)
Calcium: 9.2 mg/dL (ref 8.9–10.3)
Chloride: 102 mmol/L (ref 98–111)
Creatinine, Ser: 0.66 mg/dL (ref 0.44–1.00)
GFR, Estimated: >60 mL/min (ref >60)
Glucose, Bld: 110 mg/dL — ABNORMAL HIGH (ref 70–99)
Potassium: 4 mmol/L (ref 3.5–5.1)
Sodium: 136 mmol/L (ref 135–145)

## 2023-09-24 LAB — TROPONIN I (HIGH SENSITIVITY)
Troponin I (High Sensitivity): 3 ng/L (ref <18)
Troponin I (High Sensitivity): 3 ng/L (ref <18)

## 2023-09-24 LAB — MAGNESIUM: Magnesium: 1.8 mg/dL (ref 1.7–2.4)

## 2023-09-24 MED ORDER — DILTIAZEM HCL 25 MG/5ML IV SOLN
10.0000 mg | Freq: Once | INTRAVENOUS | Status: AC
  Administered 2023-09-24: 10 mg via INTRAVENOUS
  Filled 2023-09-24: qty 5

## 2023-09-24 MED ORDER — ACETAMINOPHEN 325 MG PO TABS: 650.0000 mg | ORAL_TABLET | Freq: Four times a day (QID) | ORAL | Status: DC | PRN

## 2023-09-24 MED ORDER — VITAMIN D3 25 MCG (1000 UNIT) PO TABS
2000.0000 [IU] | ORAL_TABLET | Freq: Every day | ORAL | Status: DC
  Administered 2023-09-25: 2000 [IU] via ORAL
  Filled 2023-09-24 (×3): qty 2

## 2023-09-24 MED ORDER — ALBUTEROL SULFATE (2.5 MG/3ML) 0.083% IN NEBU: 2.5000 mg | INHALATION_SOLUTION | Freq: Four times a day (QID) | RESPIRATORY_TRACT | Status: DC | PRN

## 2023-09-24 MED ORDER — MAGNESIUM SULFATE 2 GM/50ML IV SOLN
2.0000 g | Freq: Once | INTRAVENOUS | Status: AC
  Administered 2023-09-24: 2 g via INTRAVENOUS
  Filled 2023-09-24: qty 50

## 2023-09-24 MED ORDER — MAGNESIUM OXIDE -MG SUPPLEMENT 400 (240 MG) MG PO TABS
400.0000 mg | ORAL_TABLET | Freq: Every day | ORAL | Status: DC
  Administered 2023-09-25: 400 mg via ORAL
  Filled 2023-09-24: qty 1

## 2023-09-24 MED ORDER — BUTALBITAL-APAP-CAFFEINE 50-325-40 MG PO TABS: 1.0000 | ORAL_TABLET | Freq: Two times a day (BID) | ORAL | Status: DC | PRN

## 2023-09-24 MED ORDER — ESTRADIOL 0.5 MG PO TABS
2.0000 mg | ORAL_TABLET | Freq: Every day | ORAL | Status: DC
  Administered 2023-09-25: 2 mg via ORAL
  Filled 2023-09-24 (×2): qty 4

## 2023-09-24 MED ORDER — PANTOPRAZOLE SODIUM 40 MG PO TBEC: 40.0000 mg | DELAYED_RELEASE_TABLET | Freq: Two times a day (BID) | ORAL | Status: DC | PRN

## 2023-09-24 MED ORDER — INSULIN ASPART 100 UNIT/ML IJ SOLN: 0.0000 [IU] | Freq: Three times a day (TID) | INTRAMUSCULAR | Status: DC

## 2023-09-24 MED ORDER — CALCIUM CARBONATE 1250 (500 CA) MG PO TABS
500.0000 mg | ORAL_TABLET | Freq: Every day | ORAL | Status: DC
  Administered 2023-09-25: 1250 mg via ORAL
  Filled 2023-09-24: qty 1

## 2023-09-24 MED ORDER — SODIUM CHLORIDE 0.9 % IV BOLUS
1000.0000 mL | Freq: Once | INTRAVENOUS | Status: AC
  Administered 2023-09-24: 1000 mL via INTRAVENOUS

## 2023-09-24 MED ORDER — ESCITALOPRAM OXALATE 10 MG PO TABS
20.0000 mg | ORAL_TABLET | Freq: Every morning | ORAL | Status: DC
Start: 2023-09-25 — End: 2023-09-25
  Administered 2023-09-25: 20 mg via ORAL
  Filled 2023-09-24: qty 2

## 2023-09-24 MED ORDER — AMIODARONE HCL IN DEXTROSE 360-4.14 MG/200ML-% IV SOLN
30.0000 mg/h | INTRAVENOUS | Status: DC
  Administered 2023-09-25: 30 mg/h via INTRAVENOUS
  Filled 2023-09-24: qty 200

## 2023-09-24 MED ORDER — APIXABAN 5 MG PO TABS
5.0000 mg | ORAL_TABLET | Freq: Two times a day (BID) | ORAL | Status: DC
  Administered 2023-09-24 – 2023-09-25 (×2): 5 mg via ORAL
  Filled 2023-09-24 (×2): qty 1

## 2023-09-24 MED ORDER — VITAMIN C 500 MG PO TABS
1000.0000 mg | ORAL_TABLET | Freq: Every day | ORAL | Status: DC
  Administered 2023-09-25: 1000 mg via ORAL
  Filled 2023-09-24: qty 2

## 2023-09-24 MED ORDER — POTASSIUM CHLORIDE CRYS ER 20 MEQ PO TBCR: 20.0000 meq | EXTENDED_RELEASE_TABLET | Freq: Once | ORAL | Status: DC

## 2023-09-24 MED ORDER — ZOLPIDEM TARTRATE 5 MG PO TABS
5.0000 mg | ORAL_TABLET | Freq: Every evening | ORAL | Status: DC | PRN
  Administered 2023-09-24: 5 mg via ORAL
  Filled 2023-09-24: qty 1

## 2023-09-24 MED ORDER — INSULIN ASPART 100 UNIT/ML IJ SOLN: 0.0000 [IU] | Freq: Every day | INTRAMUSCULAR | Status: DC

## 2023-09-24 MED ORDER — BUPROPION HCL ER (XL) 150 MG PO TB24
300.0000 mg | ORAL_TABLET | Freq: Every morning | ORAL | Status: DC
  Administered 2023-09-25: 300 mg via ORAL
  Filled 2023-09-24: qty 2

## 2023-09-24 MED ORDER — AMIODARONE LOAD VIA INFUSION
150.0000 mg | Freq: Once | INTRAVENOUS | Status: AC
  Administered 2023-09-24: 150 mg via INTRAVENOUS
  Filled 2023-09-24: qty 83.34

## 2023-09-24 MED ORDER — LORATADINE 10 MG PO TABS
10.0000 mg | ORAL_TABLET | Freq: Every day | ORAL | Status: DC
  Administered 2023-09-25: 10 mg via ORAL
  Filled 2023-09-24: qty 1

## 2023-09-24 MED ORDER — HYDROXYZINE HCL 25 MG PO TABS
25.0000 mg | ORAL_TABLET | Freq: Every day | ORAL | Status: DC
  Administered 2023-09-24: 25 mg via ORAL
  Filled 2023-09-24: qty 1

## 2023-09-24 MED ORDER — MECLIZINE HCL 25 MG PO TABS: 25.0000 mg | ORAL_TABLET | Freq: Three times a day (TID) | ORAL | Status: DC | PRN

## 2023-09-24 MED ORDER — DIPHENHYDRAMINE HCL 50 MG/ML IJ SOLN: 12.5000 mg | Freq: Three times a day (TID) | INTRAMUSCULAR | Status: DC | PRN

## 2023-09-24 MED ORDER — PRAMIPEXOLE DIHYDROCHLORIDE 0.25 MG PO TABS
0.7500 mg | ORAL_TABLET | Freq: Every day | ORAL | Status: DC
  Administered 2023-09-24: 0.75 mg via ORAL
  Filled 2023-09-24: qty 3

## 2023-09-24 MED ORDER — LUBIPROSTONE 24 MCG PO CAPS
24.0000 ug | ORAL_CAPSULE | Freq: Two times a day (BID) | ORAL | Status: DC
  Filled 2023-09-24 (×3): qty 1

## 2023-09-24 MED ORDER — AMIODARONE HCL IN DEXTROSE 360-4.14 MG/200ML-% IV SOLN
60.0000 mg/h | INTRAVENOUS | Status: AC
  Administered 2023-09-24: 60 mg/h via INTRAVENOUS
  Filled 2023-09-24: qty 200

## 2023-09-24 MED ORDER — FENOFIBRATE 160 MG PO TABS
160.0000 mg | ORAL_TABLET | Freq: Every day | ORAL | Status: DC
  Administered 2023-09-25: 160 mg via ORAL
  Filled 2023-09-24 (×2): qty 1

## 2023-09-24 MED ORDER — HYDRALAZINE HCL 20 MG/ML IJ SOLN: 5.0000 mg | INTRAMUSCULAR | Status: DC | PRN

## 2023-09-24 MED ORDER — OMEGA-3-ACID ETHYL ESTERS 1 G PO CAPS
1.0000 g | ORAL_CAPSULE | Freq: Two times a day (BID) | ORAL | Status: DC
  Administered 2023-09-24 – 2023-09-25 (×2): 1 g via ORAL
  Filled 2023-09-24 (×3): qty 1

## 2023-09-24 NOTE — ED Provider Notes (Signed)
 [facility] Provider Note    Event Date/Time   First MD Initiated Contact with Patient 09/24/23 1535     (approximate)   History   Atrial Fibrillation   HPI  [name] Ross is a 58 y.o. female  who presents to the emergency department today with concern for atrial fibrillation. The patient has history of the same. Has been going in and out of it recently. Had ablation 2 years ago. Seen in the ER 5 days ago for the same. The patient had medication adjusted. Went back into afib this morning. When she is in afib has slight tightness across her chest and blurry vision. The patient denies any recent fevers or chills.       Physical Exam   Triage Vital Signs: ED Triage Vitals  Encounter Vitals Group     BP 09/24/23 1234 110/86     Systolic BP Percentile --      Diastolic BP Percentile --      Pulse Rate 09/24/23 1234 71     Resp 09/24/23 1234 16     Temp 09/24/23 1234 98.3 F (36.8 C)     Temp Source 09/24/23 1234 Oral     SpO2 09/24/23 1234 94 %     Weight 09/24/23 1236 225 lb (102.1 kg)     Height 09/24/23 1236 5\' 6"  (1.676 m)     Head Circumference --      Peak Flow --      Pain Score 09/24/23 1235 0     Pain Loc --      Pain Education --      Exclude from Growth Chart --     Most recent vital signs: Vitals:   09/24/23 1234  BP: 110/86  Pulse: 71  Resp: 16  Temp: 98.3 F (36.8 C)  SpO2: 94%   General: Awake, alert, oriented. CV:  Good peripheral perfusion. Tachycardia, irregular rhythm. Resp:  Normal effort. Lungs clear. Abd:  No distention.   ED Results / Procedures / Treatments   Labs (all labs ordered are listed, but only abnormal results are displayed) Labs Reviewed  BASIC METABOLIC PANEL WITH GFR - Abnormal; Notable for the following components:      Result Value   Glucose, Bld 110 (*)    BUN 29 (*)    All other components within normal limits  CBC - Abnormal; Notable for the following components:   Platelets 479 (*)     All other components within normal limits  PROTIME-INR  TROPONIN I (HIGH SENSITIVITY)  TROPONIN I (HIGH SENSITIVITY)     EKG  I, Marylynn Soho, attending physician, personally viewed and interpreted this EKG  EKG Time: 1244 Rate: 134 Rhythm: atrial fibrillation with RVR Axis: normal Intervals: qtc 510 QRS: narrow, q waves v1 ST changes: no st elevation Impression: abnormal ekg   RADIOLOGY I independently interpreted and visualized the CXR. My interpretation: No pneumonia Radiology interpretation:  IMPRESSION:  No acute cardiopulmonary disease.     PROCEDURES:  Critical Care performed: Yes  CRITICAL CARE Performed by: Marylynn Soho   Total critical care time: 35 minutes  Critical care time was exclusive of separately billable procedures and treating other patients.  Critical care was necessary to treat or prevent imminent or life-threatening deterioration.  Critical care was time spent personally by me on the following activities: development of treatment plan with patient and/or surrogate as well as nursing, discussions with consultants, evaluation of patient's response to treatment, examination of  patient, obtaining history from patient or surrogate, ordering and performing treatments and interventions, ordering and review of laboratory studies, ordering and review of radiographic studies, pulse oximetry and re-evaluation of patient's condition.   Procedures    MEDICATIONS ORDERED IN ED: Medications - No data to display   IMPRESSION / MDM / ASSESSMENT AND PLAN / ED COURSE  I reviewed the triage vital signs and the nursing notes.                              Differential diagnosis includes, but is not limited to, arrhythmia, ACS, electrolyte abnormality, anemia  Patient's presentation is most consistent with acute presentation with potential threat to life or bodily function.   The patient is on the cardiac monitor to evaluate for evidence of  arrhythmia and/or significant heart rate changes.  Patient presented to the emergency department today because of concerns for atrial fibrillation.  Patient has a history of paroxysmal A-fib.  EKG is consistent with A-fib with heart rate in the 30s.  Did initially try a small dose of diltiazem  IV given blood pressure.  This did not significantly change the patient's heart rate although did bring the patient's blood pressure down.  She was given IV fluids.  Discussed with Dr. Arlester Ladd with cardiology. Recommended amiodarone . Discussed with Dr. Rosalea Collin with hospitalist service who will evaluate for admission.       FINAL CLINICAL IMPRESSION(S) / ED DIAGNOSES   Final diagnoses:  Atrial fibrillation, unspecified type [facility])    Note:  This document was prepared using Dragon voice recognition software and may include unintentional dictation errors.    Marylynn Soho, MD 09/24/23 [phone]

## 2023-09-24 NOTE — Telephone Encounter (Signed)
 error

## 2023-09-24 NOTE — Telephone Encounter (Signed)
 Called patient, discussed that she felt very tired and weak- patient did not have any other symptoms to report- she did say she normally goes in and out of her rhythm, she was advised if she did not convert herself within the "normal" for her, she should have her husband drive her to the ER or call 911. Patient verbalized understanding.    Appointment scheduled for Dr.Lambert next week- patient unable to adjust any further medications- BP was 109/74. Patient has taken her normal morning medications.

## 2023-09-24 NOTE — H&P (Signed)
 History and Physical    [name] Ross:[id number] DOB: 1965-06-03 DOA: 09/24/2023  Referring MD/NP/PA:   PCP: Bertha Broad, MD   Patient coming from:  The patient is coming from home.     Chief Complaint: SOB and palpitation  HPI: [name] Ross is a 58 y.o. female with medical history significant of A-fib on Eliquis  (s/p of ablation 01/2022), hypertension, hyperlipidemia, diabetes mellitus, depression with anxiety, RLS, IBS, diverticulitis, obesity, OSA on CPAP, who presents with shortness of breath and palpitation.  Pt has history of A-fib.  Patient had ablation 01/2022, has been doing well until 2 months ago when she started having intermittent episodes of recurrent A-fib.  She reports palpitation, heart racing, SOB, dizziness, blurry vision sometimes.  No active chest pain, cough, fever or chills.  Patient was seen in ED on 4/27. Her flecainide  dose was increased to 100 mg twice daily per Dr. Amber Juniper recommendation. Pt states that her symptoms came back again this morning.  She reports SOB, dizziness, lightheadedness, palpitation, fluttering feeling.  No nausea, vomiting, diarrhea or abdominal pain.  No symptoms of UTI.  Denies dark stool or rectal bleeding.  No fall or head injury.  Patient was found to have A-fib with RVR, with heart rate up to 130s in ED.  Patient was given IV 10 mg of Cardizem . Her blood pressure dropped to 73/62.  ED physician consulted Dr. Arlester Ladd of cardiology, who recommended starting patient on amiodarone  drip.  Blood pressure 87/52 --> 119/79, heart rate improved to 110s.  Data reviewed independently and ED Course: pt was found to have WBC 6.6, GFR> 60, potassium 4.0, magnesium  1.8, troponin 3 --> 3, INR 1.1, TSH normal 2.080, free T4 is high at 12.2, free T3 is low at 19. Chest x-Murillo negative.  Patient is placed in PCU for observation.  Message sent to [facility] pool for consult    EKG: I have personally reviewed.  A-fib, QTc 510, low voltage, poor R  wave progression, heart rate of 134.  The repeated EKG showed QTc 453 after giving magnesium  sulfate and potassium chloride .  Review of Systems:   General: no fevers, chills, no body weight gain, fatigue HEENT: no blurry vision, hearing changes or sore throat Respiratory: no dyspnea, coughing, wheezing CV: no chest pain, has palpitations GI: no nausea, vomiting, abdominal pain, diarrhea, constipation GU: no dysuria, burning on urination, increased urinary frequency, hematuria  Ext: Has trace leg edema Neuro: no unilateral weakness, numbness, or tingling, hearing loss.  Has dizziness, lightheadedness, blurry vision Skin: no rash, no skin tear. MSK: No muscle spasm, no deformity, no limitation of range of movement in spin Heme: No easy bruising.  Travel history: No recent long distant travel.   Allergy:  Allergies  Allergen Reactions   Metformin Hcl     Other reaction(s): bloating Other reaction(s): bloating    Venlafaxine     Sleepy    Past Medical History:  Diagnosis Date   Allergy    Anal skin tag    Anemia    Anxiety    Atrial fibrillation (HCC)    Atrial fibrillation (HCC)    Constipation    Depression    Diabetes mellitus without complication (HCC)    pre DM   Diverticulosis    Diverticulosis, sigmoid    Dyslipidemia    GERD (gastroesophageal reflux disease)    Heartburn    Hemorrhoids    internal and external   History of diverticulitis    10/ 2016  History of galactorrhea    History of stress test    a. Pt reports h/o stress test and echo by cardiologist in [place] (no records in Epic).  Both reportedly nl.   Hyperlipidemia    Hypertension    IBS (irritable bowel syndrome)    Joint pain    Morbid obesity (HCC)    Palpitations    PONV (postoperative nausea and vomiting)    severe   Prediabetes    Rash    elbows and behind knees   Restless leg syndrome    Sleep apnea    wears c-pap   Vitamin D  deficiency    Wears glasses     Past Surgical  History:  Procedure Laterality Date   ATRIAL FIBRILLATION ABLATION N/A 07/31/2021   Procedure: ATRIAL FIBRILLATION ABLATION;  Surgeon: Boyce Byes, MD;  Location: [facility];  Service: Cardiovascular;  Laterality: N/A;   BAND HEMORRHOIDECTOMY  2012   Dr Andriette Keeling   BREAST BIOPSY Right 08/25/2022   US  RT BREAST BX W LOC DEV 1ST LESION IMG BX SPEC US  GUIDE 08/25/2022 [facility]MOGRAPHY   CARDIOVERSION N/A 04/08/2021   Procedure: CARDIOVERSION;  Surgeon: Sammy Crisp, MD;  Location: [facility] ORS;  Service: Cardiovascular;  Laterality: N/A;   CESAREAN SECTION  1994   COLONOSCOPY  06/18/2011   CYSTO/  BILATERAL PYELOGRAM RETROGRADES/  TRANSOBTURATOR SLING  03/08/2006   EXCISION OF SKIN TAG N/A 02/13/2016   Procedure: ANAL SKIN TAG EXCISION;  Surgeon: Joyce Nixon, MD;  Location: [facility];  Service: General;  Laterality: N/A;   LAPAROSCOPIC CHOLECYSTECTOMY  05/22/2001   SHOULDER SURGERY      Social History:  reports that she has never smoked. She has never used smokeless tobacco. She reports that she does not drink alcohol and does not use drugs.  Family History:  Family History  Problem Relation Age of Onset   Cancer Mother    Colon cancer Mother    Diabetes Mother    Hypertension Mother    Atrial fibrillation Mother    Obesity Mother    Hyperlipidemia Father    Diabetes Father        committed suicide   Skin cancer Father    Suicidality Father    Hypertension Father    Diabetes Sister    Diabetes Sister    Breast cancer Other        materanl great aunt   Sleep apnea Neg Hx    Esophageal cancer Neg Hx    Rectal cancer Neg Hx    Stomach cancer Neg Hx    Restless legs syndrome Neg Hx      Prior to Admission medications   Medication Sig Start Date End Date Taking? Authorizing Provider  acetaminophen  (TYLENOL ) 500 MG tablet Take 1,000 mg by mouth every 6 (six) hours as needed for mild pain.    [provider]  albuterol (VENTOLIN HFA) 108 (90  Base) MCG/ACT inhaler Inhale 2 puffs into the lungs every 6 (six) hours as needed for wheezing or shortness of breath.    [provider]  apixaban  (ELIQUIS ) 5 MG TABS tablet Take 1 tablet (5 mg total) by mouth 2 (two) times daily. 09/07/23   Constancia Delton, MD  Ascorbic Acid (VITAMIN C) 1000 MG tablet Take by mouth daily.    [provider]  buPROPion  (WELLBUTRIN  XL) 300 MG 24 hr tablet Take 300 mg by mouth every morning.     [provider]  butalbital -acetaminophen -caffeine  (FIORICET )  50-325-40 MG tablet Take 1 tablet by mouth 2 (two) times daily as needed for headache.    [provider]  CALCIUM PO Take 1,200 tablets by mouth daily.    [provider]  carvedilol  (COREG ) 25 MG tablet TAKE TWO TABLETS BY MOUTH TWICE DAILY 06/03/23   Roark Chick, PA-C  Cholecalciferol (VITAMIN D -3 PO) Take 2,000 Units by mouth daily.    [provider]  dicyclomine  (BENTYL ) 10 MG capsule Take one tablet by mouth once to twice daily as needed 07/26/23   Suzanna Erp M, PA-C  escitalopram  (LEXAPRO ) 20 MG tablet Take 20 mg by mouth every morning.     [provider]  estradiol (ESTRACE) 2 MG tablet Take 2 mg by mouth daily. 10/28/20   [provider]  fenofibrate  micronized (LOFIBRA) 200 MG capsule Take 200 mg by mouth daily before breakfast.  02/04/15   [provider]  fexofenadine (ALLEGRA) 180 MG tablet     [provider]  flecainide  (TAMBOCOR ) 100 MG tablet Take 1 tablet (100 mg total) by mouth 2 (two) times daily. 09/19/23 10/19/23  Twilla Galea, MD  fluticasone (FLONASE) 50 MCG/ACT nasal spray Place 1 spray into both nostrils daily.    [provider]  hydrOXYzine  (ATARAX ) 25 MG tablet Take 25 mg by mouth at bedtime.    [provider]  ibuprofen (ADVIL) 200 MG tablet Take 600-800 mg by mouth every 6 (six) hours as needed for moderate pain.    [provider]  losartan  (COZAAR ) 50 MG tablet  Take 50 mg by mouth every morning.    [provider]  lubiprostone  (AMITIZA ) 24 MCG capsule Take 1 capsule (24 mcg total) by mouth 2 (two) times daily with a meal. Please schedule a yearly follow up appointment for further refills. Thank you. 07/26/23   McMichael, Elba Greathouse, PA-C  magnesium  oxide (MAG-OX) 400 (240 Mg) MG tablet Take 400 mg by mouth daily. 04/21/21   [provider]  meclizine  (ANTIVERT ) 25 MG tablet Take 1 tablet (25 mg total) by mouth 3 (three) times daily as needed for dizziness. 03/08/19   Clevester Dally., MD  mometasone  (ELOCON ) 0.1 % ointment Apply 1 application. topically 2 (two) times daily as needed (rash). 06/11/20   [provider]  omega-3 acid ethyl esters (LOVAZA ) 1 g capsule Take 1 capsule (1 g total) by mouth 2 (two) times daily. 01/25/20   Visser, Jacquelyn D, PA-C  omeprazole  (PRILOSEC) 40 MG capsule Take 1 capsule (40 mg total) by mouth in the morning and at bedtime. 06/10/21   Armbruster, Lendon Queen, MD  ondansetron  (ZOFRAN -ODT) 8 MG disintegrating tablet Take 8 mg by mouth every 8 (eight) hours as needed. 07/03/21   [provider]  pramipexole  (MIRAPEX ) 0.75 MG tablet TAKE ONE TABLET BY MOUTH AT BEDTIME 08/27/23   Millikan, Megan, NP  RABEprazole (ACIPHEX) 20 MG tablet Take 20 mg by mouth every morning.    [provider]  Semaglutide , 2 MG/DOSE, (OZEMPIC , 2 MG/DOSE,) 8 MG/3ML SOPN Inject 2 mg into the skin once a week. 11/12/22     tacrolimus (PROTOPIC) 0.1 % ointment Apply 1 application. topically 2 (two) times daily as needed (rash). 01/08/21   [provider]  triamcinolone (KENALOG) 0.1 % Apply 1 application. topically 2 (two) times daily as needed (rash). 05/15/20   [provider]  triamterene -hydrochlorothiazide  (MAXZIDE -25) 37.5-25 MG per tablet Take 0.5 tablets by mouth every morning.     [provider]  valACYclovir (VALTREX) 1000 MG tablet Take 1,000 mg by mouth 2 (two) times daily as needed  (cold sores).    [provider]  zolpidem  (AMBIEN ) 10 MG tablet Take 10 mg by mouth at bedtime as needed for sleep.    [provider]    Physical Exam: Vitals:   09/24/23 1800 09/24/23 1830 09/24/23 1930 09/24/23 2045  BP: 94/71 93/73 119/79   Pulse:  (!) 116    Resp: 14 (!) 23 (!) 21 16  Temp:   98.2 F (36.8 C)   TempSrc:   Oral   SpO2: 97% 98% 98%   Weight:      Height:       General: Not in acute distress HEENT:       Eyes: PERRL, EOMI, no jaundice       ENT: No discharge from the ears and nose, no pharynx injection, no tonsillar enlargement.        Neck: No JVD, no bruit, no mass felt. Heme: No neck lymph node enlargement. Cardiac: S1/S2, irregularly irregular rhythm,, No murmurs, No gallops or rubs. Respiratory: No rales, wheezing, rhonchi or rubs. GI: Soft, nondistended, nontender, no rebound pain, no organomegaly, BS present. GU: No hematuria Ext: Has trace leg edema bilaterally. 1+DP/PT pulse bilaterally. Musculoskeletal: No joint deformities, No joint redness or warmth, no limitation of ROM in spin. Skin: No rashes.  Neuro: Alert, oriented X3, cranial nerves II-XII grossly intact, moves all extremities normally.  Psych: Patient is not psychotic, no suicidal or hemocidal ideation.  Labs on Admission: I have personally reviewed following labs and imaging studies  CBC: Recent Labs  Lab 09/19/23 1432 09/22/23 0928 09/24/23 1242  WBC 10.0 9.2 6.6  NEUTROABS  --  6.4  --   HGB 13.9 12.2 13.3  HCT 40.9 37.4 40.9  MCV 88.1 90 89.1  PLT 496* 425 479*   Basic Metabolic Panel: Recent Labs  Lab 09/19/23 1432 09/22/23 0928 09/24/23 1242 09/24/23 1450  NA 132* 139 136  --   K 3.8 4.2 4.0  --   CL 102 101 102  --   CO2 22 21 22   --   GLUCOSE 111* 103* 110*  --   BUN 27* 21 29*  --   CREATININE 0.80 0.74 0.66  --   CALCIUM 9.3 9.7 9.2  --   MG 1.6*  --   --  1.8   GFR: Estimated Creatinine Clearance: 92.4 mL/min (by C-G formula based on  SCr of 0.66 mg/dL). Liver Function Tests: Recent Labs  Lab 09/22/23 0928  AST 10  ALT 10  ALKPHOS 41*  BILITOT <0.2  PROT 6.4  ALBUMIN 4.0   No results for input(s): "LIPASE", "AMYLASE" in the last 168 hours. No results for input(s): "AMMONIA" in the last 168 hours. Coagulation Profile: Recent Labs  Lab 09/19/23 1447 09/24/23 1242  INR 1.1 1.1   Cardiac Enzymes: No results for input(s): "CKTOTAL", "CKMB", "CKMBINDEX", "TROPONINI" in the last 168 hours. BNP (last 3 results) No results for input(s): "PROBNP" in the last 8760 hours. HbA1C: Recent Labs    09/22/23 0928  HGBA1C 5.8*   CBG: No results for input(s): "GLUCAP" in the last 168 hours. Lipid Profile: Recent Labs    09/22/23 0928  CHOL 169  HDL 59  LDLCALC 61  TRIG 319*   Thyroid  Function Tests: Recent Labs    09/22/23 0928  TSH 2.080  T4TOTAL 12.2*   Anemia Panel: Recent Labs    09/22/23 7846  VITAMINB12 197*  FOLATE 12.6   Urine analysis:    Component Value Date/Time   COLORURINE YELLOW (A) 03/08/2019 1238   APPEARANCEUR CLEAR (A) 03/08/2019 1238   LABSPEC 1.019 03/08/2019 1238   PHURINE 7.0 03/08/2019 1238   GLUCOSEU NEGATIVE 03/08/2019 1238   HGBUR SMALL (A) 03/08/2019 1238   BILIRUBINUR NEGATIVE 03/08/2019 1238   KETONESUR NEGATIVE 03/08/2019 1238   PROTEINUR NEGATIVE 03/08/2019 1238   NITRITE NEGATIVE 03/08/2019 1238   LEUKOCYTESUR NEGATIVE 03/08/2019 1238   Sepsis Labs: @LABRCNTIP (procalcitonin:4,lacticidven:4) )No results found for this or any previous visit (from the past 240 hours).   Radiological Exams on Admission:   Assessment/Plan Principal Problem:   Atrial fibrillation with RVR (HCC) Active Problems:   Essential hypertension   Hyperlipidemia   Diabetes mellitus without complication (HCC)   RLS (restless legs syndrome)   Depression with anxiety   OSA (obstructive sleep apnea)   Obesity (BMI 30-39.9)   Assessment and Plan:  Atrial fibrillation with RVR  (HCC): Pt presents with A-fib with RVR, heart rate up to 130, blood pressure dropped after giving Cardizem , switched to amiodarone  drip with improvement, heart rate 110s.  Blood pressure improved. Message sent to [facility] pool for consult.  - will place in tele bed for obs - continue amiodarone  drip - Hold coreg  due to soft blood pressure - Give 2 g magnesium  sulfate (magnesium  of 1.8 currently) - Give 20 mEq potassium chloride  (potassium is 4.0 currently)  Essential hypertension - IV hydralazine  as needed - Hold Coreg , Maxide, Cozaar  due to softer blood pressure  Hyperlipidemia -Patient is taking Repatha  Diabetes mellitus without complication (HCC): Recent A1c 5.8, well-controlled.  Patient is taking Ozempic  -Sliding scale insulin   RLS (restless legs syndrome) -Pramipexole   Depression with anxiety -Continue home medications  Prolonged QT interval: Initial QTc 510 which has resolved after giving magnesium  sulfate IV and potassium chloride  tablet.  The repeat the EKG showed QTc 453  OSA (obstructive sleep apnea) -CPAP  Obesity (BMI 30-39.9): Body weight 102.1 kg, BMI 36.33 - Encourage losing weight - Exercise healthy diet     DVT ppx: on Eliquis   Code Status: Full code    Family Communication:     not done, no family member is at bed side.    Disposition Plan:  Anticipate discharge back to previous environment  Consults called:  Message sent to [facility] pool for consult   Admission status and Level of care: Progressive:    for obs as inpt        Dispo: The patient is from: Home              Anticipated d/c is to: Home              Anticipated d/c date is: 1 day              Patient currently is not medically stable to d/c.    Severity of Illness:  The appropriate patient status for this patient is OBSERVATION. Observation status is judged to be reasonable and necessary in order to provide the required intensity of service to ensure the patient's  safety. The patient's presenting symptoms, physical exam findings, and initial radiographic and laboratory data in the context of their medical condition is felt to place them at decreased risk for further clinical deterioration. Furthermore, it is anticipated that the patient will be medically stable for discharge from the hospital within 2 midnights of admission.  Date of Service 09/24/2023    Fidencio [facility]   If 7PM-7AM, please contact night-coverage [email or web address] 09/24/2023, 9:13 PM

## 2023-09-24 NOTE — ED Notes (Addendum)
 RN to bedside to introduce self to pt. MD goodman at bedside and so is CT. Pt was placed in room from triage and NOT placed on cardiac monitor for her AFIB. This RN placed pt on cardiac monitor. Pt is feeling SOB and fluttering in her chest.

## 2023-09-24 NOTE — ED Triage Notes (Signed)
 Pt arrives via POV with c/o Afib with Hx of the same. Pt was here on Sunday and their medication was increased but they are still going in and out of afib and has been dealing with this for over a month. Pt has a hx of an ablation. Pt is A&Ox4.

## 2023-09-24 NOTE — ED Notes (Signed)
 Pt states "I am not diabetic". Refusing BGL.

## 2023-09-25 DIAGNOSIS — I4891 Unspecified atrial fibrillation: Secondary | ICD-10-CM | POA: Diagnosis not present

## 2023-09-25 DIAGNOSIS — I10 Essential (primary) hypertension: Secondary | ICD-10-CM | POA: Diagnosis not present

## 2023-09-25 DIAGNOSIS — G4733 Obstructive sleep apnea (adult) (pediatric): Secondary | ICD-10-CM | POA: Diagnosis not present

## 2023-09-25 LAB — BASIC METABOLIC PANEL WITH GFR
Anion gap: 14 (ref 5–15)
BUN: 23 mg/dL — ABNORMAL HIGH (ref 6–20)
CO2: 20 mmol/L — ABNORMAL LOW (ref 22–32)
Calcium: 7.9 mg/dL — ABNORMAL LOW (ref 8.9–10.3)
Chloride: 101 mmol/L (ref 98–111)
Creatinine, Ser: 0.65 mg/dL (ref 0.44–1.00)
GFR, Estimated: >60 mL/min (ref >60)
Glucose, Bld: 226 mg/dL — ABNORMAL HIGH (ref 70–99)
Potassium: 3.7 mmol/L (ref 3.5–5.1)
Sodium: 135 mmol/L (ref 135–145)

## 2023-09-25 LAB — CBC
HCT: 34.2 % — ABNORMAL LOW (ref 36.0–46.0)
Hemoglobin: 11.1 g/dL — ABNORMAL LOW (ref 12.0–15.0)
MCH: 30.3 pg (ref 26.0–34.0)
MCHC: 32.5 g/dL (ref 30.0–36.0)
MCV: 93.4 fL (ref 80.0–100.0)
Platelets: 281 10*3/uL (ref 150–400)
RBC: 3.66 MIL/uL — ABNORMAL LOW (ref 3.87–5.11)
RDW: 14.1 % (ref 11.5–15.5)
WBC: 8.3 10*3/uL (ref 4.0–10.5)
nRBC: 0.4 % — ABNORMAL HIGH (ref 0.0–0.2)

## 2023-09-25 LAB — URINE DRUG SCREEN, QUALITATIVE (ARMC ONLY)
Amphetamines, Ur Screen: NOT DETECTED
Barbiturates, Ur Screen: NOT DETECTED
Benzodiazepine, Ur Scrn: NOT DETECTED
Cannabinoid 50 Ng, Ur ~~LOC~~: NOT DETECTED
Cocaine Metabolite,Ur ~~LOC~~: NOT DETECTED
MDMA (Ecstasy)Ur Screen: POSITIVE — AB
Methadone Scn, Ur: NOT DETECTED
Opiate, Ur Screen: NOT DETECTED
Phencyclidine (PCP) Ur S: NOT DETECTED
Tricyclic, Ur Screen: POSITIVE — AB

## 2023-09-25 LAB — HIV ANTIBODY (ROUTINE TESTING W REFLEX): HIV Screen 4th Generation wRfx: NONREACTIVE

## 2023-09-25 MED ORDER — AMIODARONE HCL 200 MG PO TABS: ORAL_TABLET | ORAL | 0 refills | Status: DC

## 2023-09-25 MED ORDER — CARVEDILOL 25 MG PO TABS
12.5000 mg | ORAL_TABLET | Freq: Two times a day (BID) | ORAL | Status: DC
Start: 2023-09-25 — End: 2023-11-29

## 2023-09-25 MED ORDER — FLUTICASONE PROPIONATE 50 MCG/ACT NA SUSP
1.0000 | Freq: Every day | NASAL | Status: DC
  Administered 2023-09-25: 1 via NASAL
  Filled 2023-09-25: qty 16

## 2023-09-25 MED ORDER — CARVEDILOL 25 MG PO TABS
50.0000 mg | ORAL_TABLET | Freq: Two times a day (BID) | ORAL | Status: DC
  Administered 2023-09-25: 50 mg via ORAL
  Filled 2023-09-25: qty 2

## 2023-09-25 NOTE — ED Notes (Signed)
 Went to try and obtain blood pressure, pt refusing as she wants to sleep at this time.

## 2023-09-25 NOTE — Discharge Summary (Signed)
 Physician Discharge Summary  [name] Ross MWU:[id number] DOB: 1965-07-14 DOA: 09/24/2023  PCP: Bertha Broad, MD  Admit date: 09/24/2023 Discharge date: 09/25/2023  Admitted From: Home Disposition:  Home  Recommendations for Outpatient Follow-up:  Follow up with PCP in 1-2 weeks Follow up EP next week  Home Health:No  Equipment/Devices:None   Discharge Condition:Stable  CODE STATUS:FULL  Diet recommendation: Heart  Brief/Interim Summary:  58 y.o. female with medical history significant of A-fib on Eliquis  (s/p of ablation 01/2022), hypertension, hyperlipidemia, diabetes mellitus, depression with anxiety, RLS, IBS, diverticulitis, obesity, OSA on CPAP, who presents with shortness of breath and palpitation.   Pt has history of A-fib.  Patient had ablation 01/2022, has been doing well until 2 months ago when she started having intermittent episodes of recurrent A-fib.  She reports palpitation, heart racing, SOB, dizziness, blurry vision sometimes.  No active chest pain, cough, fever or chills.  Patient was seen in ED on 4/27. Her flecainide  dose was increased to 100 mg twice daily per Dr. Amber Juniper recommendation. Pt states that her symptoms came back again this morning.  She reports SOB, dizziness, lightheadedness, palpitation, fluttering feeling.  No nausea, vomiting, diarrhea or abdominal pain.  No symptoms of UTI.  Denies dark stool or rectal bleeding.  No fall or head injury.   Patient was found to have A-fib with RVR, with heart rate up to 130s in ED.  Patient was given IV 10 mg of Cardizem . Her blood pressure dropped to 73/62.  ED physician consulted Dr. Arlester Ladd of cardiology, who recommended starting patient on amiodarone  drip.  Blood pressure 87/52 --> 119/79, heart rate improved to 110s.  Seen by cardiology.  Recommend discontinuation of flecainide .  Amiodarone  p.o. taper.  Half dose of Coreg .  Hold losartan  and Maxide until seen by EP next week.  Stable for discharge  home.    Discharge Diagnoses:  Principal Problem:   Atrial fibrillation with RVR (HCC) Active Problems:   Essential hypertension   Hyperlipidemia   Diabetes mellitus without complication (HCC)   RLS (restless legs syndrome)   Depression with anxiety   OSA (obstructive sleep apnea)   Obesity (BMI 30-39.9)  Atrial fibrillation with RVR (HCC): Pt presents with A-fib with RVR, heart rate up to 130, blood pressure dropped after giving Cardizem , switched to amiodarone  drip with improvement, heart rate 110s.  Blood pressure improved. Message sent to [facility] pool for consult. Plan: Seen by cardiology.  Recommend discontinuation of flecainide .  Amiodarone  p.o. taper.  Half dose of Coreg .  Hold losartan  and Maxide until seen by EP next week.  Stable for discharge home.   Discharge Instructions  Discharge Instructions     Diet - low sodium heart healthy   Complete by: As directed    Increase activity slowly   Complete by: As directed       Allergies as of 09/25/2023       Reactions   Metformin Hcl    Other reaction(s): bloating Other reaction(s): bloating   Venlafaxine    Sleepy        Medication List     STOP taking these medications    flecainide  100 MG tablet Commonly known as: TAMBOCOR    losartan  50 MG tablet Commonly known as: COZAAR    triamterene -hydrochlorothiazide  37.5-25 MG tablet Commonly known as: MAXZIDE -25       TAKE these medications    acetaminophen  500 MG tablet Commonly known as: TYLENOL  Take 1,000 mg by mouth every 6 (six) hours as needed  for mild pain.   albuterol 108 (90 Base) MCG/ACT inhaler Commonly known as: VENTOLIN HFA Inhale 2 puffs into the lungs every 6 (six) hours as needed for wheezing or shortness of breath.   amiodarone  200 MG tablet Commonly known as: Pacerone  Take 2 tablets (400 mg total) by mouth 2 (two) times daily for 7 days, THEN 1 tablet (200 mg total) 2 (two) times daily for 7 days, THEN 1 tablet (200 mg total)  daily. Start taking on: Sep 25, 2023   apixaban  5 MG Tabs tablet Commonly known as: Eliquis  Take 1 tablet (5 mg total) by mouth 2 (two) times daily.   buPROPion  300 MG 24 hr tablet Commonly known as: WELLBUTRIN  XL Take 300 mg by mouth every morning.   butalbital -acetaminophen -caffeine  50-325-40 MG tablet Commonly known as: FIORICET  Take 1 tablet by mouth 2 (two) times daily as needed for headache.   CALCIUM PO Take 1,200 tablets by mouth daily.   carvedilol  25 MG tablet Commonly known as: COREG  Take 0.5 tablets (12.5 mg total) by mouth 2 (two) times daily. What changed: how much to take   dicyclomine  10 MG capsule Commonly known as: BENTYL  Take one tablet by mouth once to twice daily as needed   escitalopram  20 MG tablet Commonly known as: LEXAPRO  Take 20 mg by mouth every morning.   estradiol 2 MG tablet Commonly known as: ESTRACE Take 2 mg by mouth daily.   fenofibrate  micronized 200 MG capsule Commonly known as: LOFIBRA Take 200 mg by mouth daily before breakfast.   fexofenadine 180 MG tablet Commonly known as: ALLEGRA Take 180 mg by mouth daily.   fluticasone 50 MCG/ACT nasal spray Commonly known as: FLONASE Place 1 spray into both nostrils daily.   hydrOXYzine  25 MG capsule Commonly known as: VISTARIL  Take 25 mg by mouth at bedtime.   ibuprofen 200 MG tablet Commonly known as: ADVIL Take 600-800 mg by mouth every 6 (six) hours as needed for moderate pain.   lubiprostone  24 MCG capsule Commonly known as: AMITIZA  Take 1 capsule (24 mcg total) by mouth 2 (two) times daily with a meal. Please schedule a yearly follow up appointment for further refills. Thank you.   magnesium  oxide 400 (240 Mg) MG tablet Commonly known as: MAG-OX Take 400 mg by mouth daily.   meclizine  25 MG tablet Commonly known as: ANTIVERT  Take 1 tablet (25 mg total) by mouth 3 (three) times daily as needed for dizziness.   mometasone  0.1 % ointment Commonly known as:  ELOCON  Apply 1 application. topically 2 (two) times daily as needed (rash).   omega-3 acid ethyl esters 1 g capsule Commonly known as: Lovaza  Take 1 capsule (1 g total) by mouth 2 (two) times daily.   omeprazole  40 MG capsule Commonly known as: PRILOSEC Take 1 capsule (40 mg total) by mouth in the morning and at bedtime.   ondansetron  8 MG disintegrating tablet Commonly known as: ZOFRAN -ODT Take 8 mg by mouth every 8 (eight) hours as needed for nausea or vomiting.   Ozempic  (2 MG/DOSE) 8 MG/3ML Sopn Generic drug: Semaglutide  (2 MG/DOSE) Inject 2 mg into the skin once a week.   pramipexole  0.75 MG tablet Commonly known as: MIRAPEX  TAKE ONE TABLET BY MOUTH AT BEDTIME   RABEprazole 20 MG tablet Commonly known as: ACIPHEX Take 20 mg by mouth every morning.   tacrolimus 0.1 % ointment Commonly known as: PROTOPIC Apply 1 application. topically 2 (two) times daily as needed (rash).   triamcinolone ointment 0.1 %  Commonly known as: KENALOG Apply 1 Application topically 2 (two) times daily as needed.   valACYclovir 1000 MG tablet Commonly known as: VALTREX Take 1,000 mg by mouth 2 (two) times daily as needed (cold sores).   vitamin C 1000 MG tablet Take 1,000 mg by mouth daily.   VITAMIN D -3 PO Take 2,000 Units by mouth daily.   zolpidem  10 MG tablet Commonly known as: AMBIEN  Take 10 mg by mouth at bedtime as needed for sleep.        Follow-up Information     Boyce Byes, MD Follow up.   Specialties: Cardiology, Radiology Why: Follow up within 1 week Contact information: [address] [place] [address] [phone]                Allergies  Allergen Reactions   Metformin Hcl     Other reaction(s): bloating Other reaction(s): bloating    Venlafaxine     Sleepy    Consultations: Cardiology- [facility]   Procedures/Studies: DG Chest 2 View Result Date: 09/24/2023 CLINICAL DATA:  Atrial fibrillation. EXAM: CHEST - 2 VIEW  COMPARISON:  X-Bera 09/19/2023. FINDINGS: No consolidation, pneumothorax or effusion. No edema. Normal cardiopericardial silhouette. Calcified aorta. IMPRESSION: No acute cardiopulmonary disease. Electronically Signed   By: Adrianna Horde M.D.   On: 09/24/2023 13:30   DG Chest 2 View Result Date: 09/19/2023 CLINICAL DATA:  Atrial fibrillation, dizziness EXAM: CHEST - 2 VIEW COMPARISON:  06/29/2022 FINDINGS: The heart size and mediastinal contours are within normal limits. Both lungs are clear. The visualized skeletal structures are unremarkable. IMPRESSION: No active cardiopulmonary disease. Electronically Signed   By: Bobbye Burrow M.D.   On: 09/19/2023 16:16   EXERCISE TOLERANCE TEST (ETT) Result Date: 09/14/2023   Baseline ECG demonstrates normal sinus rhythm with low voltage.   The patient demonstrates decreased functional capacity, exercising for 6:00 minutes and achieving a heart rate of 112 bpm (69% MPHR).  No angina was reported.   No significant ST segment or T wave abnormalities were observed.   No arrhythmia was seen.  There was no QRS widening or other conduction abnormality during stress or recovery. Non-diagnostic exercise tolerance test for ischemia due to failure to achieve target heart rate.  No significant ECG changes observed at workload achieved.      Subjective: Seen and examined on the day of discharge.  Stable no distress.  Discharge home.  Discharge Exam: Vitals:   09/25/23 0900 09/25/23 1325  BP: 114/74 115/70  Pulse: 81 81  Resp: 20 20  Temp:    SpO2: 98% 97%   Vitals:   09/25/23 0640 09/25/23 0846 09/25/23 0900 09/25/23 1325  BP:   114/74 115/70  Pulse:   81 81  Resp:   20 20  Temp: 98.2 F (36.8 C) 98.3 F (36.8 C)    TempSrc: Oral Oral    SpO2:   98% 97%  Weight:      Height:        General: Pt is alert, awake, not in acute distress Cardiovascular: RRR, S1/S2 +, no rubs, no gallops Respiratory: CTA bilaterally, no wheezing, no rhonchi Abdominal:  Soft, NT, ND, bowel sounds + Extremities: no edema, no cyanosis    The results of significant diagnostics from this hospitalization (including imaging, microbiology, ancillary and laboratory) are listed below for reference.     Microbiology: No results found for this or any previous visit (from the past 240 hours).   Labs: BNP (last 3 results)  No results for input(s): "BNP" in the last 8760 hours. Basic Metabolic Panel: Recent Labs  Lab 09/19/23 1432 09/22/23 0928 09/24/23 1242 09/24/23 1450 09/25/23 0636  NA 132* 139 136  --  135  K 3.8 4.2 4.0  --  3.7  CL 102 101 102  --  101  CO2 22 21 22   --  20*  GLUCOSE 111* 103* 110*  --  226*  BUN 27* 21 29*  --  23*  CREATININE 0.80 0.74 0.66  --  0.65  CALCIUM 9.3 9.7 9.2  --  7.9*  MG 1.6*  --   --  1.8  --    Liver Function Tests: Recent Labs  Lab 09/22/23 0928  AST 10  ALT 10  ALKPHOS 41*  BILITOT <0.2  PROT 6.4  ALBUMIN 4.0   No results for input(s): "LIPASE", "AMYLASE" in the last 168 hours. No results for input(s): "AMMONIA" in the last 168 hours. CBC: Recent Labs  Lab 09/19/23 1432 09/22/23 0928 09/24/23 1242 09/25/23 0636  WBC 10.0 9.2 6.6 8.3  NEUTROABS  --  6.4  --   --   HGB 13.9 12.2 13.3 11.1*  HCT 40.9 37.4 40.9 34.2*  MCV 88.1 90 89.1 93.4  PLT 496* 425 479* 281   Cardiac Enzymes: No results for input(s): "CKTOTAL", "CKMB", "CKMBINDEX", "TROPONINI" in the last 168 hours. BNP: Invalid input(s): "POCBNP" CBG: No results for input(s): "GLUCAP" in the last 168 hours. D-Dimer No results for input(s): "DDIMER" in the last 72 hours. Hgb A1c No results for input(s): "HGBA1C" in the last 72 hours. Lipid Profile No results for input(s): "CHOL", "HDL", "LDLCALC", "TRIG", "CHOLHDL", "LDLDIRECT" in the last 72 hours. Thyroid  function studies No results for input(s): "TSH", "T4TOTAL", "T3FREE", "THYROIDAB" in the last 72 hours.  Invalid input(s): "FREET3" Anemia work up No results for input(s):  "VITAMINB12", "FOLATE", "FERRITIN", "TIBC", "IRON", "RETICCTPCT" in the last 72 hours. Urinalysis    Component Value Date/Time   COLORURINE YELLOW (A) 03/08/2019 1238   APPEARANCEUR CLEAR (A) 03/08/2019 1238   LABSPEC 1.019 03/08/2019 1238   PHURINE 7.0 03/08/2019 1238   GLUCOSEU NEGATIVE 03/08/2019 1238   HGBUR SMALL (A) 03/08/2019 1238   BILIRUBINUR NEGATIVE 03/08/2019 1238   KETONESUR NEGATIVE 03/08/2019 1238   PROTEINUR NEGATIVE 03/08/2019 1238   NITRITE NEGATIVE 03/08/2019 1238   LEUKOCYTESUR NEGATIVE 03/08/2019 1238   Sepsis Labs Recent Labs  Lab 09/19/23 1432 09/22/23 0928 09/24/23 1242 09/25/23 0636  WBC 10.0 9.2 6.6 8.3   Microbiology No results found for this or any previous visit (from the past 240 hours).   Time coordinating discharge: Over 30 minutes  SIGNED:   Tiajuana Fluke, MD  [facility] 09/25/2023, 2:30 PM Pager   If 7PM-7AM, please contact night-coverage

## 2023-09-25 NOTE — ED Notes (Signed)
 Pt awake and ambulatory to the bathroom, attempting to provide urine sample.

## 2023-09-25 NOTE — ED Notes (Signed)
 Pt attempted to collect urine specimen for labs. Unsuccessful. Would like to try a hat next time.

## 2023-09-25 NOTE — Consult Note (Signed)
 Cardiology Consultation   Patient ID: BHAVNA Ross MRN: [id number]; DOB: 21-Aug-1965  Admit date: 09/24/2023 Date of Consult: 09/25/2023  PCP:  Bertha Broad, MD   [facility] Providers Cardiologist:  Constancia Delton, MD  Electrophysiologist:  Boyce Byes, MD      Patient Profile:   [name] Ross is a 58 y.o. female with a hx of paroxysmal Afib s/p ablation in 2023, Aflutter, HTN, OSA who is being seen 09/25/2023 for the evaluation of Afib RVR at the request of Dr. Mosetta Areola.  History of Present Illness:   Ms. [name] Ross is s/p ablation with PVI, posterior wall and CTI 07/2021 by Dr. Marven Slimmer. She saw Dr. Marven Slimmer 08/2022 at which time was having intermittent skipped beats. Kardia mobile reported PVCs. Coreg  was continued.   She was last seen 09/02/23 to discuss AA options. She was started on flecainide  50mg  BID. Plan for ETT with possible redo ablation.   Since seeing EP she has been in and out of A-fib with elevated heart rates. She went to the ER on 09/19/2023 for rapid Afib and converted to NSR with IV dilt. Flecainide  was increased to 100mg  BID.  Patient presented back to the ER 09/24/23 with rapid atrial fibrillation. When she is in Afib she feels tired, dizzy, SOB and fluttering. No chest pain or LLE.  In the ER blood pressure 110/86, pulse rate 71 bpm, respiratory rate 16, afebrile.  Labs showed unremarkable BMET. Plt 479.  High-sensitivity troponin negative x 2.  Mag 1.8. EKG showed A-fib RVR with a heart rate of 134 bpm.  Chest x-Awwad nonacute.  Patient was given IV Dilt which did not improve heart rate.  She was given IV fluids.  Dr. Nannie Babinski was consulted who recommended amiodarone .  She was admitted for further workup.  Past Medical History:  Diagnosis Date   Allergy    Anal skin tag    Anemia    Anxiety    Atrial fibrillation (HCC)    Atrial fibrillation (HCC)    Constipation    Depression    Diabetes mellitus without complication (HCC)    pre DM    Diverticulosis    Diverticulosis, sigmoid    Dyslipidemia    GERD (gastroesophageal reflux disease)    Heartburn    Hemorrhoids    internal and external   History of diverticulitis    10/ 2016   History of galactorrhea    History of stress test    a. Pt reports h/o stress test and echo by cardiologist in [place] (no records in Epic).  Both reportedly nl.   Hyperlipidemia    Hypertension    IBS (irritable bowel syndrome)    Joint pain    Morbid obesity (HCC)    Palpitations    PONV (postoperative nausea and vomiting)    severe   Prediabetes    Rash    elbows and behind knees   Restless leg syndrome    Sleep apnea    wears c-pap   Vitamin D  deficiency    Wears glasses     Past Surgical History:  Procedure Laterality Date   ATRIAL FIBRILLATION ABLATION N/A 07/31/2021   Procedure: ATRIAL FIBRILLATION ABLATION;  Surgeon: Boyce Byes, MD;  Location: [facility];  Service: Cardiovascular;  Laterality: N/A;   BAND HEMORRHOIDECTOMY  2012   Dr Andriette Keeling   BREAST BIOPSY Right 08/25/2022   US  RT BREAST BX W LOC DEV 1ST LESION IMG BX SPEC US  GUIDE 08/25/2022  GI-[facility] MAMMOGRAPHY   CARDIOVERSION N/A 04/08/2021   Procedure: CARDIOVERSION;  Surgeon: Sammy Crisp, MD;  Location: [facility] ORS;  Service: Cardiovascular;  Laterality: N/A;   CESAREAN SECTION  1994   COLONOSCOPY  06/18/2011   CYSTO/  BILATERAL PYELOGRAM RETROGRADES/  TRANSOBTURATOR SLING  03/08/2006   EXCISION OF SKIN TAG N/A 02/13/2016   Procedure: ANAL SKIN TAG EXCISION;  Surgeon: Joyce Nixon, MD;  Location: [facility];  Service: General;  Laterality: N/A;   LAPAROSCOPIC CHOLECYSTECTOMY  05/22/2001   SHOULDER SURGERY       Home Medications:  Prior to Admission medications   Medication Sig Start Date End Date Taking? Authorizing Provider  acetaminophen  (TYLENOL ) 500 MG tablet Take 1,000 mg by mouth every 6 (six) hours as needed for mild pain.   Yes [provider]  albuterol (VENTOLIN HFA) 108  (90 Base) MCG/ACT inhaler Inhale 2 puffs into the lungs every 6 (six) hours as needed for wheezing or shortness of breath.   Yes [provider]  apixaban  (ELIQUIS ) 5 MG TABS tablet Take 1 tablet (5 mg total) by mouth 2 (two) times daily. 09/07/23  Yes Agbor-Etang, Polly Brink, MD  Ascorbic Acid (VITAMIN C) 1000 MG tablet Take 1,000 mg by mouth daily.   Yes [provider]  buPROPion  (WELLBUTRIN  XL) 300 MG 24 hr tablet Take 300 mg by mouth every morning.    Yes [provider]  butalbital -acetaminophen -caffeine  (FIORICET ) 50-325-40 MG tablet Take 1 tablet by mouth 2 (two) times daily as needed for headache.   Yes [provider]  CALCIUM PO Take 1,200 tablets by mouth daily.   Yes [provider]  carvedilol  (COREG ) 25 MG tablet TAKE TWO TABLETS BY MOUTH TWICE DAILY 06/03/23  Yes Dunn, Elvia Hammans, PA-C  Cholecalciferol (VITAMIN D -3 PO) Take 2,000 Units by mouth daily.   Yes [provider]  dicyclomine  (BENTYL ) 10 MG capsule Take one tablet by mouth once to twice daily as needed 07/26/23  Yes McMichael, Bayley M, PA-C  escitalopram  (LEXAPRO ) 20 MG tablet Take 20 mg by mouth every morning.    Yes [provider]  estradiol (ESTRACE) 2 MG tablet Take 2 mg by mouth daily. 10/28/20  Yes [provider]  fenofibrate  micronized (LOFIBRA) 200 MG capsule Take 200 mg by mouth daily before breakfast.  02/04/15  Yes [provider]  fexofenadine (ALLEGRA) 180 MG tablet Take 180 mg by mouth daily.   Yes [provider]  flecainide  (TAMBOCOR ) 100 MG tablet Take 1 tablet (100 mg total) by mouth 2 (two) times daily. 09/19/23 10/19/23 Yes Twilla Galea, MD  fluticasone (FLONASE) 50 MCG/ACT nasal spray Place 1 spray into both nostrils daily.   Yes [provider]  hydrOXYzine  (VISTARIL ) 25 MG capsule Take 25 mg by mouth at bedtime. 09/13/23  Yes [provider]  ibuprofen (ADVIL) 200 MG tablet Take 600-800 mg by mouth every 6  (six) hours as needed for moderate pain.   Yes [provider]  losartan  (COZAAR ) 50 MG tablet Take 50 mg by mouth every morning.   Yes [provider]  lubiprostone  (AMITIZA ) 24 MCG capsule Take 1 capsule (24 mcg total) by mouth 2 (two) times daily with a meal. Please schedule a yearly follow up appointment for further refills. Thank you. 07/26/23  Yes McMichael, Bayley M, PA-C  magnesium  oxide (MAG-OX) 400 (240 Mg) MG tablet Take 400 mg by mouth daily. 04/21/21  Yes [provider]  meclizine  (ANTIVERT ) 25 MG tablet Take 1  tablet (25 mg total) by mouth 3 (three) times daily as needed for dizziness. 03/08/19  Yes Clevester Dally., MD  mometasone  (ELOCON ) 0.1 % ointment Apply 1 application. topically 2 (two) times daily as needed (rash). 06/11/20  Yes [provider]  omega-3 acid ethyl esters (LOVAZA ) 1 g capsule Take 1 capsule (1 g total) by mouth 2 (two) times daily. 01/25/20  Yes Visser, Jacquelyn D, PA-C  ondansetron  (ZOFRAN -ODT) 8 MG disintegrating tablet Take 8 mg by mouth every 8 (eight) hours as needed for nausea or vomiting. 07/03/21  Yes [provider]  pramipexole  (MIRAPEX ) 0.75 MG tablet TAKE ONE TABLET BY MOUTH AT BEDTIME 08/27/23  Yes Millikan, Megan, NP  RABEprazole (ACIPHEX) 20 MG tablet Take 20 mg by mouth every morning.   Yes [provider]  Semaglutide , 2 MG/DOSE, (OZEMPIC , 2 MG/DOSE,) 8 MG/3ML SOPN Inject 2 mg into the skin once a week. 11/12/22  Yes   triamcinolone ointment (KENALOG) 0.1 % Apply 1 Application topically 2 (two) times daily as needed. 07/11/23  Yes [provider]  triamterene -hydrochlorothiazide  (MAXZIDE -25) 37.5-25 MG per tablet Take 0.5 tablets by mouth every morning.    Yes [provider]  valACYclovir (VALTREX) 1000 MG tablet Take 1,000 mg by mouth 2 (two) times daily as needed (cold sores).   Yes [provider]  zolpidem  (AMBIEN ) 10 MG tablet Take 10 mg by mouth at bedtime as needed for  sleep.   Yes [provider]  omeprazole  (PRILOSEC) 40 MG capsule Take 1 capsule (40 mg total) by mouth in the morning and at bedtime. Patient taking differently: Take 40 mg by mouth 2 (two) times daily as needed. 06/10/21   Armbruster, Lendon Queen, MD  tacrolimus (PROTOPIC) 0.1 % ointment Apply 1 application. topically 2 (two) times daily as needed (rash). 01/08/21   [provider]    Inpatient Medications: Scheduled Meds:  apixaban   5 mg Oral BID   vitamin C  1,000 mg Oral Daily   buPROPion   300 mg Oral q morning   calcium carbonate  500 mg of elemental calcium Oral Q breakfast   cholecalciferol  2,000 Units Oral Daily   escitalopram   20 mg Oral q morning   estradiol  2 mg Oral Daily   fenofibrate   160 mg Oral Daily   hydrOXYzine   25 mg Oral QHS   loratadine  10 mg Oral Daily   lubiprostone   24 mcg Oral BID WC   magnesium  oxide  400 mg Oral Daily   omega-3 acid ethyl esters  1 g Oral BID   potassium chloride   20 mEq Oral Once   pramipexole   0.75 mg Oral QHS   Continuous Infusions:  sodium chloride      amiodarone  30 mg/hr (09/25/23 0841)   PRN Meds: acetaminophen , albuterol, butalbital -acetaminophen -caffeine , diphenhydrAMINE, hydrALAZINE , meclizine , pantoprazole , zolpidem   Allergies:    Allergies  Allergen Reactions   Metformin Hcl     Other reaction(s): bloating Other reaction(s): bloating    Venlafaxine     Sleepy    Social History:   Social History   Socioeconomic History   Marital status: Married    Spouse name: Lavonia Powers   Number of children: 2   Years of education: RN   Highest education level: Not on file  Occupational History   Occupation: RN  Tobacco Use   Smoking status: Never   Smokeless tobacco: Never   Tobacco comments:    Never smoke 08/26/21  Vaping Use   Vaping status: Never Used  Substance and Sexual Activity   Alcohol use: No    Alcohol/week: 0.0 standard drinks of alcohol   Drug use: No   Sexual activity: Not on file     Comment: post menopausal per pt  Other Topics Concern   Not on file  Social History Narrative   Lives in [place].  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  Occasionally drinks coffee but not a heavy caffeine  drinker.     Social Drivers of Corporate investment banker Strain: Not on file  Food Insecurity: Not on file  Transportation Needs: Not on file  Physical Activity: Not on file  Stress: Not on file  Social Connections: Not on file  Intimate Partner Violence: Not on file    Family History:    Family History  Problem Relation Age of Onset   Cancer Mother    Colon cancer Mother    Diabetes Mother    Hypertension Mother    Atrial fibrillation Mother    Obesity Mother    Hyperlipidemia Father    Diabetes Father        committed suicide   Skin cancer Father    Suicidality Father    Hypertension Father    Diabetes Sister    Diabetes Sister    Breast cancer Other        materanl great aunt   Sleep apnea Neg Hx    Esophageal cancer Neg Hx    Rectal cancer Neg Hx    Stomach cancer Neg Hx    Restless legs syndrome Neg Hx      ROS:  Please see the history of present illness.   All other ROS reviewed and negative.     Physical Exam/Data:   Vitals:   09/25/23 0640 09/25/23 0640 09/25/23 0846 09/25/23 0900  BP: 113/73   114/74  Pulse: 78     Resp: (!) 22   20  Temp:  98.2 F (36.8 C) 98.3 F (36.8 C)   TempSrc:  Oral Oral   SpO2: 98%   98%  Weight:      Height:        Intake/Output Summary (Last 24 hours) at 09/25/2023 0939 Last data filed at 09/24/2023 2101 Gross per 24 hour  Intake 2000 ml  Output --  Net 2000 ml      09/24/2023   12:36 PM 09/22/2023    7:00 AM 09/19/2023    2:26 PM  Last 3 Weights  Weight (lbs) 225 lb 228 lb 220 lb  Weight (kg) 102.059 kg 103.42 kg 99.791 kg     Body mass index is 36.32 kg/m.  General:  Well nourished, well developed, in no acute distress HEENT: normal Neck: no JVD Vascular: No carotid bruits; Distal  pulses 2+ bilaterally Cardiac:  normal S1, S2; RRR; no murmur  Lungs:  clear to auscultation bilaterally, no wheezing, rhonchi or rales  Abd: soft, nontender, no hepatomegaly  Ext: no edema Musculoskeletal:  No deformities, BUE and BLE strength normal and equal Skin: warm and dry  Neuro:  CNs 2-12 intact, no focal abnormalities noted Psych:  Normal affect   EKG:  The EKG was personally reviewed and demonstrates:  Afib 134bpm, LAD, nonspecific t wave changes Telemetry:  Telemetry was personally reviewed and demonstrates:  Afib HR 120s>NSR HR 80s  Relevant CV Studies:   Long term monitor, 07/02/2022 HR 70 - 197 bpm, average 82 bpm. 1 nonsustained SVT, longest 4 beats. Rare supraventricular and ventricular ectopy. No  atrial fibrillation. All symptom trigger episodes correspond to sinus rhythm with PAC or PVC.   Cardiac CTA, 07/24/2021 1. Normal coronary calcium score of 0. Patient is low risk for coronary events. 2. Normal coronary origin with right dominance.  3. No evidence of CAD.  4. CAD-RADS 0. Consider non-atherosclerotic causes of chest pain.   TTE, 04/07/2021  1. Left ventricular ejection fraction, by estimation, is 60 to 65%. The left ventricle has normal function. The left ventricle has no regional wall motion abnormalities. There is mild asymmetric left ventricular hypertrophy of the basal-septal segment. Left ventricular diastolic parameters are indeterminate.   2. Right ventricular systolic function is normal. The right ventricular size is normal. Tricuspid regurgitation signal is inadequate for assessing PA pressure.   3. The mitral valve is normal in structure. Trivial mitral valve regurgitation. No evidence of mitral stenosis.   4. The aortic valve is normal in structure. Aortic valve regurgitation is not visualized. No aortic stenosis is present.  Laboratory Data:  High Sensitivity Troponin:   Recent Labs  Lab 09/19/23 1432 09/24/23 1242 09/24/23 1450  TROPONINIHS  4 3 3      Chemistry Recent Labs  Lab 09/19/23 1432 09/22/23 0928 09/24/23 1242 09/24/23 1450 09/25/23 0636  NA 132* 139 136  --  135  K 3.8 4.2 4.0  --  3.7  CL 102 101 102  --  101  CO2 22 21 22   --  20*  GLUCOSE 111* 103* 110*  --  226*  BUN 27* 21 29*  --  23*  CREATININE 0.80 0.74 0.66  --  0.65  CALCIUM 9.3 9.7 9.2  --  7.9*  MG 1.6*  --   --  1.8  --   GFRNONAA >60  --  >60  --  >60  ANIONGAP 8  --  12  --  14    Recent Labs  Lab 09/22/23 0928  PROT 6.4  ALBUMIN 4.0  AST 10  ALT 10  ALKPHOS 41*  BILITOT <0.2   Lipids  Recent Labs  Lab 09/22/23 0928  CHOL 169  TRIG 319*  HDL 59  LABVLDL 49*  LDLCALC 61    Hematology Recent Labs  Lab 09/22/23 0928 09/24/23 1242 09/25/23 0636  WBC 9.2 6.6 8.3  RBC 4.16 4.59 3.66*  HGB 12.2 13.3 11.1*  HCT 37.4 40.9 34.2*  MCV 90 89.1 93.4  MCH 29.3 29.0 30.3  MCHC 32.6 32.5 32.5  RDW 13.1 14.0 14.1  PLT 425 479* 281   Thyroid   Recent Labs  Lab 09/22/23 0928  TSH 2.080    BNPNo results for input(s): "BNP", "PROBNP" in the last 168 hours.  DDimer No results for input(s): "DDIMER" in the last 168 hours.   Radiology/Studies:  DG Chest 2 View Result Date: 09/24/2023 CLINICAL DATA:  Atrial fibrillation. EXAM: CHEST - 2 VIEW COMPARISON:  X-[place]mon 09/19/2023. FINDINGS: No consolidation, pneumothorax or effusion. No edema. Normal cardiopericardial silhouette. Calcified aorta. IMPRESSION: No acute cardiopulmonary disease. Electronically Signed   By: Adrianna Horde M.D.   On: 09/24/2023 13:30     Assessment and Plan:   Paroxysmal Afib/flutter - patient presented with recurrent AFib with HR in the 130s started on IV amiodarone  with conversion to NSR - Patient has history of A-fib with prior ablation in 07/2021.  She was seen by EP last month and started on flecainide  with plan for ETT.  Note suggested need for possible redo ablation. Seen in the er 4/27 for recurrent rapid afib  converted with IV dilt and flecainide  was  increased to 100mg  BID - she remains in  NSR and is feeling much better - PTA Coreg  held>restart as able -  continue Eliquis  5 mg twice daily, no missed doses - flecainide  does not seem to be affective. Not a good long-term candidate for amio given young age - may need to continue with amiodarone  and see EP as outpatient for further recommendations. She has apt with Dr. Marven Slimmer 5/7  HTN - PTA maxzide , Losartan , Coreg  held - Bps normal  OSA - CPAP at night   For questions or updates, please contact [facility] Please consult [email or web address] for contact info under    Signed, Haadi Santellan Rebekah Canada, PA-C  09/25/2023 9:39 AM

## 2023-09-28 NOTE — Progress Notes (Unsigned)
 Electrophysiology Office Follow up Visit Note:    Date:  09/29/2023   ID:  [name] Ross, DOB 1966-01-27, MRN [id number]  PCP:  Bertha Broad, MD  [facility] Cardiologist:  Constancia Delton, MD  [facility] Electrophysiologist:  Boyce Byes, MD    Interval History:     [name] Ross is a 58 y.o. female who presents for a follow up visit.   The patient last saw [name] in clinic September 02, 2023.  She has a history of atrial fibrillation and flutter, hypertension, sleep apnea.  She had a prior catheter ablation in March 2023 during which the pulmonary veins, posterior wall and CTI were ablated.  At the appointment with [name] the patient reported multiple symptomatic episodes of atrial fibrillation.  She was started on flecainide  at that appointment and continued on her twice daily carvedilol .  She was seen in the emergency department on Sep 24, 2023 with symptomatic atrial fibrillation.  She was started on amiodarone .  She tells me she had an excellent result after her prior catheter ablation that lasted until 1 month ago.  1 month ago she began having more atrial fibrillation.  She is not locked in A-fib but goes in and out.  She is symptomatic with fatigue and shortness of breath and palpitations when out of rhythm.  She takes Eliquis  twice daily for stroke prophylaxis.  She continues to take amiodarone  for as prescribed after her recent hospital visit.        Past medical, surgical, social and family history were reviewed.  ROS:   Please see the history of present illness.    All other systems reviewed and are negative.  EKGs/Labs/Other Studies Reviewed:    The following studies were reviewed today:  September 14, 2023 ETT No QRS widening or conduction abnormalities with stress  July 04, 2022 ZIO monitor No atrial fibrillation  Sep 24, 2023 EKG reviewed and shows atrial flutter with variable AV conduction.  September 19, 2023 EKG shows sinus rhythm  September 19, 2023 EKG #2 shows atrial fibrillation with a rapid ventricular rate  April 07, 2021 echo EF 60%   EKG Interpretation Date/Time:  Wednesday Sep 29 2023 11:23:20 EDT Ventricular Rate:  131 PR Interval:    QRS Duration:  76 QT Interval:  352 QTC Calculation: 519 R Axis:   53  Text Interpretation: Atrial fibrillation with rapid ventricular response Low voltage QRS Confirmed by Harvie Liner [phone]) on 09/29/2023 11:31:57 AM    Physical Exam:    VS:  BP 119/81 (BP Location: Left Arm, Patient Position: Sitting, Cuff Size: Large)   Pulse (!) 134   Ht 5\' 6"  (1.676 m)   Wt 225 lb (102.1 kg)   LMP 02/13/2015   SpO2 96%   BMI 36.32 kg/m     Wt Readings from Last 3 Encounters:  09/29/23 225 lb (102.1 kg)  09/24/23 225 lb (102.1 kg)  09/22/23 228 lb (103.4 kg)     GEN: no distress CARD: irregularly irregular, tachycardic, No MRG RESP: No IWOB. CTAB.      ASSESSMENT:    1. Typical atrial flutter (HCC)   2. Persistent atrial fibrillation (HCC)    PLAN:    In order of problems listed above:  #Persistent atrial fibrillation flutter #High risk med monitoring-amiodarone  Initially did well after a March 2023 catheter ablation during which the pulmonary veins, posterior wall and CTI were ablated.  Now with symptomatic recurrence.  I discussed treatment options with the patient  including alternative antiarrhythmic drugs and redo catheter ablation.  She is in favor of redo catheter ablation which I think is the best strategy.    Plan for amiodarone  to be a short-term medication only.  Discussed treatment options today for AF including antiarrhythmic drug therapy and ablation. Discussed risks, recovery and likelihood of success with each treatment strategy. Risk, benefits, and alternatives to EP study and ablation for afib were discussed. These risks include but are not limited to stroke, bleeding, vascular damage, tamponade, perforation, damage to the esophagus, lungs, phrenic  nerve and other structures, pulmonary vein stenosis, worsening renal function, coronary vasospasm and death.  Discussed potential need for repeat ablation procedures and antiarrhythmic drugs after an initial ablation. The patient understands these risk and wishes to proceed.  We will therefore proceed with catheter ablation at the next available time.  Carto, ICE, anesthesia are requested for the procedure.  Will also obtain CT PV protocol prior to the procedure to exclude LAA thrombus and further evaluate atrial anatomy.  I will have her follow-up in the A-fib clinic in 2 weeks to make sure we are trending in the right direction as the amiodarone  builds up in her system.    Signed, Harvie Liner, MD, [facility], [facility] 09/29/2023 11:32 AM    Electrophysiology [facility]

## 2023-09-29 ENCOUNTER — Encounter: Payer: Self-pay | Admitting: Cardiology

## 2023-09-29 ENCOUNTER — Ambulatory Visit: Attending: Cardiology | Admitting: Cardiology

## 2023-09-29 ENCOUNTER — Other Ambulatory Visit: Payer: Self-pay

## 2023-09-29 VITALS — BP 119/81 | HR 134 | Ht 66.0 in | Wt 225.0 lb

## 2023-09-29 DIAGNOSIS — I4819 Other persistent atrial fibrillation: Secondary | ICD-10-CM | POA: Diagnosis not present

## 2023-09-29 DIAGNOSIS — I483 Typical atrial flutter: Secondary | ICD-10-CM | POA: Diagnosis not present

## 2023-09-29 NOTE — Patient Instructions (Addendum)
 Medication Instructions:  Your physician recommends that you continue on your current medications as directed. Please refer to the Current Medication list given to you today.  *If you need a refill on your cardiac medications before your next appointment, please call your pharmacy*  Lab Work: BMET and CBC - you may go to any [facility] location to have these drawn within 30 days of your procedure (after June 24th)  Testing/Procedures: Cardiac CT Your physician has requested that you have cardiac CT. Cardiac computed tomography (CT) is a painless test that uses an x-Lorimer machine to take clear, detailed pictures of your heart. For further information please visit [email or web address] Please follow instruction sheet as given. We will call you to schedule your CT scan. It will be done about three weeks prior to your ablation.  Ablation Your physician has recommended that you have an ablation. Catheter ablation is a medical procedure used to treat some cardiac arrhythmias (irregular heartbeats). During catheter ablation, a long, thin, flexible tube is put into a blood vessel in your groin (upper thigh), or neck. This tube is called an ablation catheter. It is then guided to your heart through the blood vessel. Radio frequency waves destroy small areas of heart tissue where abnormal heartbeats may cause an arrhythmia to start.  You are scheduled for Atrial Fibrillation Ablation on Thursday, July 24 with Dr. Harvie Liner.Please arrive at the Main Entrance A at [facility]: [address] [place], [place] [address] at 5:30 AM   Follow-Up: At [facility], you and your health needs are our priority.  As part of our continuing mission to provide you with exceptional heart care, we have created designated Provider Care Teams.  These Care Teams include your primary Cardiologist (physician) and Advanced Practice Providers (APPs -  Physician Assistants and Nurse Practitioners) who all work  together to provide you with the care you need, when you need it.   Your next appointment:   We will contact you about your post-procedure follow up appointments.  Follow up in 2 weeks at the AFIB clinic

## 2023-10-04 NOTE — Progress Notes (Deleted)
 [name] Ross

## 2023-10-05 ENCOUNTER — Telehealth: Payer: 59 | Admitting: Adult Health

## 2023-10-05 ENCOUNTER — Telehealth: Payer: Self-pay | Admitting: Adult Health

## 2023-10-05 NOTE — Telephone Encounter (Signed)
 Pt called in regards to rescheduling appt . Pt states she thought Appt was 4:00 pm . Appt was at 3:15 pm   Appt scheduled >

## 2023-10-06 ENCOUNTER — Ambulatory Visit (INDEPENDENT_AMBULATORY_CARE_PROVIDER_SITE_OTHER): Admitting: Family Medicine

## 2023-10-06 ENCOUNTER — Encounter (INDEPENDENT_AMBULATORY_CARE_PROVIDER_SITE_OTHER): Payer: Self-pay | Admitting: Family Medicine

## 2023-10-06 VITALS — BP 110/73 | HR 70 | Temp 98.4°F | Ht 66.0 in | Wt 221.0 lb

## 2023-10-06 DIAGNOSIS — R7303 Prediabetes: Secondary | ICD-10-CM | POA: Diagnosis not present

## 2023-10-06 DIAGNOSIS — Z6835 Body mass index (BMI) 35.0-35.9, adult: Secondary | ICD-10-CM

## 2023-10-06 DIAGNOSIS — E559 Vitamin D deficiency, unspecified: Secondary | ICD-10-CM

## 2023-10-06 DIAGNOSIS — E538 Deficiency of other specified B group vitamins: Secondary | ICD-10-CM

## 2023-10-06 DIAGNOSIS — E785 Hyperlipidemia, unspecified: Secondary | ICD-10-CM | POA: Diagnosis not present

## 2023-10-06 DIAGNOSIS — E66812 Obesity, class 2: Secondary | ICD-10-CM

## 2023-10-06 MED ORDER — VITAMIN D (ERGOCALCIFEROL) 1.25 MG (50000 UNIT) PO CAPS: 50000.0000 [IU] | ORAL_CAPSULE | ORAL | 0 refills | Status: DC

## 2023-10-06 NOTE — Assessment & Plan Note (Signed)
 The 10-year ASCVD risk score (Arnett DK, et al., 2019) is: 4.2%   Values used to calculate the score:     Age: 58 years     Sex: Female     Is Non-Hispanic African American: No     Diabetic: Yes     Tobacco smoker: No     Systolic Blood Pressure: 110 mmHg     Is BP treated: Yes     HDL Cholesterol: 59 mg/dL     Total Cholesterol: 169 mg/dL  On statin.  HDL at goal and triglyceride elevated.  Will work on nutrition and lifestyle changes and follow up on repeat labs in 4 months.

## 2023-10-06 NOTE — Assessment & Plan Note (Signed)

## 2023-10-06 NOTE — Assessment & Plan Note (Signed)
 Discussed importance of vitamin d supplementation.  Vitamin d supplementation has been shown to decrease fatigue, decrease risk of progression to insulin resistance and then prediabetes, decreases risk of falling in older age and can even assist in decreasing depressive symptoms in PTSD.   Prescription for Vitamin D sent in.

## 2023-10-06 NOTE — Assessment & Plan Note (Signed)
 Low B12 on initial labs done on 4/30.  Patient to start OTC b supplement.  Will follow up on level in 3 months.

## 2023-10-06 NOTE — Progress Notes (Signed)
 SUBJECTIVE:  Chief Complaint: Obesity  Interim History: Patient voices she did well prior to her hospitalization for atrial fibrillation.  She felt very poorly when she was going in and out of a. Fib.  She is now on amiodarone  and that has helped but she is still having a bit of flutters.  She has another ablation planned for July.  She finds the meal plan somewhat boring because she really likes food and cooking.  Doesn't think she used snack calories most of the days. She has a beach trip planned for June 21-28 with her family.  [name] Ross is here to discuss her progress with her obesity treatment plan. She is on the Category 3 Plan and states she is following her eating plan approximately 50 % of the time. She states she is not exercising.   OBJECTIVE: Visit Diagnoses: Problem List Items Addressed This Visit       Other   Prediabetes - Primary   Pathophysiology of progression through insulin  resistance to prediabetes and diabetes was discussed at length today.  Patient to continue to monitor and be in control of total intake of snack calories which may be simple carbohydrates but should be consumed only after the patient has taken in all the nutrition for the day.  Macronutrient identification, classification and daily intake ratios were discussed.  Plan to repeat labs in 3 months to monitor both hemoglobin A1c and insulin  levels.  No medications at this time as patient is not having significant hunger or cravings that would make following meal plan more difficult.         Hyperlipidemia   The 10-year ASCVD risk score (Arnett DK, et al., 2019) is: 4.2%   Values used to calculate the score:     Age: 58 years     Sex: Female     Is Non-Hispanic African American: No     Diabetic: Yes     Tobacco smoker: No     Systolic Blood Pressure: 110 mmHg     Is BP treated: Yes     HDL Cholesterol: 59 mg/dL     Total Cholesterol: 169 mg/dL  On statin.  HDL at goal and triglyceride elevated.   Will work on nutrition and lifestyle changes and follow up on repeat labs in 4 months.        Vitamin D  deficiency   Discussed importance of vitamin d  supplementation.  Vitamin d  supplementation has been shown to decrease fatigue, decrease risk of progression to insulin  resistance and then prediabetes, decreases risk of falling in older age and can even assist in decreasing depressive symptoms in PTSD.   Prescription for Vitamin D  sent in.        Relevant Medications   Vitamin D , Ergocalciferol , (DRISDOL) 1.25 MG (50000 UNIT) CAPS capsule   B12 deficiency   Low B12 on initial labs done on 4/30.  Patient to start OTC b supplement.  Will follow up on level in 3 months.      Other Visit Diagnoses       Class 2 severe obesity due to excess calories with serious comorbidity and body mass index (BMI) of 36.0 to 36.9 in adult (HCC)         BMI 35.0-35.9,adult           Vitals Temp: 98.4 F (36.9 C) BP: 110/73 Pulse Rate: 70 SpO2: 95 %   Anthropometric Measurements Height: 5\' 6"  (1.676 m) Weight: 221 lb (100.2 kg) BMI (Calculated): 35.69 Weight at Last  Visit: 228 lb Weight Lost Since Last Visit: 7 Weight Gained Since Last Visit: 0 Starting Weight: 228 lb Total Weight Loss (lbs): 7 lb (3.175 kg)   Body Composition  Body Fat %: 45.6 % Fat Mass (lbs): 101 lbs Muscle Mass (lbs): 114.6 lbs Total Body Water (lbs): 81.8 lbs Visceral Fat Rating : 13   Other Clinical Data Today's Visit #: 2 Starting Date: 09/22/23 Comments: Cat 3     ASSESSMENT AND PLAN:  Diet: [name] Ross is currently in the action stage of change. As such, her goal is to continue with weight loss efforts and has agreed to the Category 3 Plan.   Exercise:  For substantial health benefits, adults should do at least 150 minutes (2 hours and 30 minutes) a week of moderate-intensity, or 75 minutes (1 hour and 15 minutes) a week of vigorous-intensity aerobic physical activity, or an equivalent combination of  moderate- and vigorous-intensity aerobic activity. Aerobic activity should be performed in episodes of at least 10 minutes, and preferably, it should be spread throughout the week.  Behavior Modification:  We discussed the following Behavioral Modification Strategies today: increasing lean protein intake, decreasing simple carbohydrates, increasing vegetables, meal planning and cooking strategies, keeping healthy foods in the home, and planning for success.   Return in about 3 weeks (around 10/27/2023).   She was informed of the importance of frequent follow up visits to maximize her success with intensive lifestyle modifications for her multiple health conditions.  Attestation Statements:   Reviewed by clinician on day of visit: allergies, medications, problem list, medical history, surgical history, family history, social history, and previous encounter notes.    Donaciano Frizzle, MD

## 2023-10-07 ENCOUNTER — Encounter (INDEPENDENT_AMBULATORY_CARE_PROVIDER_SITE_OTHER): Payer: Self-pay | Admitting: Family Medicine

## 2023-10-28 ENCOUNTER — Encounter: Payer: Self-pay | Admitting: Cardiology

## 2023-10-28 MED ORDER — AMIODARONE HCL 200 MG PO TABS
200.0000 mg | ORAL_TABLET | Freq: Every day | ORAL | 3 refills | Status: DC
Start: 2023-10-28 — End: 2024-03-01

## 2023-10-28 MED ORDER — AMIODARONE HCL 200 MG PO TABS
200.0000 mg | ORAL_TABLET | Freq: Every day | ORAL | 3 refills | Status: DC
Start: 2023-10-28 — End: 2023-10-28

## 2023-10-28 NOTE — Addendum Note (Signed)
 Addended by: Hollace Lund on: 10/28/2023 01:24 PM   Modules accepted: Orders

## 2023-11-19 ENCOUNTER — Encounter (HOSPITAL_COMMUNITY): Payer: Self-pay

## 2023-11-22 ENCOUNTER — Ambulatory Visit (INDEPENDENT_AMBULATORY_CARE_PROVIDER_SITE_OTHER): Admitting: Family Medicine

## 2023-11-23 ENCOUNTER — Ambulatory Visit
Admission: RE | Admit: 2023-11-23 | Discharge: 2023-11-23 | Disposition: A | Discharge status: Home / Self Care | Source: Ambulatory Visit | Attending: Cardiology | Admitting: Cardiology

## 2023-11-23 DIAGNOSIS — I10 Essential (primary) hypertension: Secondary | ICD-10-CM | POA: Diagnosis not present

## 2023-11-23 DIAGNOSIS — I4819 Other persistent atrial fibrillation: Secondary | ICD-10-CM

## 2023-11-23 DIAGNOSIS — I483 Typical atrial flutter: Secondary | ICD-10-CM

## 2023-11-23 DIAGNOSIS — G4733 Obstructive sleep apnea (adult) (pediatric): Secondary | ICD-10-CM | POA: Diagnosis not present

## 2023-11-23 DIAGNOSIS — R7301 Impaired fasting glucose: Secondary | ICD-10-CM | POA: Diagnosis not present

## 2023-11-23 DIAGNOSIS — E66812 Obesity, class 2: Secondary | ICD-10-CM | POA: Diagnosis not present

## 2023-11-23 DIAGNOSIS — E785 Hyperlipidemia, unspecified: Secondary | ICD-10-CM | POA: Diagnosis not present

## 2023-11-23 MED ORDER — SODIUM CHLORIDE 0.9 % IV SOLN: INTRAVENOUS | Status: DC

## 2023-11-23 MED ORDER — IOHEXOL 350 MG/ML SOLN
100.0000 mL | Freq: Once | INTRAVENOUS | Status: AC | PRN
  Administered 2023-11-23: 100 mL via INTRAVENOUS

## 2023-11-24 ENCOUNTER — Other Ambulatory Visit (INDEPENDENT_AMBULATORY_CARE_PROVIDER_SITE_OTHER): Payer: Self-pay | Admitting: Family Medicine

## 2023-11-24 DIAGNOSIS — E559 Vitamin D deficiency, unspecified: Secondary | ICD-10-CM

## 2023-11-26 ENCOUNTER — Other Ambulatory Visit: Payer: Self-pay | Admitting: Physician Assistant

## 2023-11-26 DIAGNOSIS — I480 Paroxysmal atrial fibrillation: Secondary | ICD-10-CM

## 2023-11-26 DIAGNOSIS — I10 Essential (primary) hypertension: Secondary | ICD-10-CM

## 2023-11-28 NOTE — Progress Notes (Deleted)
 Electrophysiology Clinic Note    Date:  11/28/2023  Patient ID:  [name], Ross Jun 26, 1965, MRN [id number] PCP:  Yolande Toribio MATSU, MD  Cardiologist:  Redell Cave, MD Electrophysiologist: OLE ONEIDA HOLTS, MD   Discussed the use of AI scribe software for clinical note transcription with the patient, who gave verbal consent to proceed.   Patient Profile    Chief Complaint: Afib follow-up  History of Present Illness: [name] Ross is a 58 y.o. female with PMH notable for parox afib, aflutter, HTN, OSA; seen today for OLE ONEIDA HOLTS, MD for routine electrophysiology followup.   She is s/p AF ablation with isolation of pulm veins, posterior wall, and CTI on 07/2021 by Dr. HOLTS. She last saw Dr. HOLTS 08/2022 at which time was having intermittent skipped beats. Kardia Mobile reported them as PVCs. Her coreg  was continued.  She unfortunately has had recurrence of her afib.  I saw her two week ago for routine follow-up where she had two AF episodes within days of each other. At the time she requested to continue to monitor AF. She sent multiple mychart messages since that time with rhythms concerning for AF and requested sooner follow-up.   On follow-up today, she states that her palpitations have lessened since schedule today's appt, but still happening. Often she has palps at night when trying to sleep. She requests to started AAD medication. She is having intermittent dizziness and questions whether her BP is causing this, or heart rhythm. She checks BP very rarely at home.   She continues to take eliquis  BID, no bleeding concerns.   she denies chest pain, dyspnea, PND, orthopnea, nausea, vomiting, dizziness, syncope, edema, weight gain, or early satiety.      Arrhythmia/Device History Flecainide  - stopped 2023     ROS:  Please see the history of present illness. All other systems are reviewed and otherwise negative.    Physical Exam    VS:  LMP 02/13/2015   BMI: There is no height or weight on file to calculate BMI.  Wt Readings from Last 3 Encounters:  10/06/23 221 lb (100.2 kg)  09/29/23 225 lb (102.1 kg)  09/24/23 225 lb (102.1 kg)     GEN- The patient is well appearing, alert and oriented x 3 today.   Lungs- Clear to ausculation bilaterally, normal work of breathing.  Heart- Regular rate and rhythm, no murmurs, rubs or gallops Extremities- No peripheral edema, warm, dry    Studies Reviewed   Previous EP, cardiology notes.    EKG is not ordered. Personal review of EKG from 08/18/2023 shows:  SR at 86, low voltage PR , QRS 76, QT 384       Long term monitor, 07/02/2022 HR 70 - 197 bpm, average 82 bpm. 1 nonsustained SVT, longest 4 beats. Rare supraventricular and ventricular ectopy. No atrial fibrillation. All symptom trigger episodes correspond to sinus rhythm with PAC or PVC.  Cardiac CTA, 07/24/2021 1. Normal coronary calcium  score of 0. Patient is low risk for coronary events. 2. Normal coronary origin with right dominance.  3. No evidence of CAD.  4. CAD-RADS 0. Consider non-atherosclerotic causes of chest pain.  TTE, 04/07/2021  1. Left ventricular ejection fraction, by estimation, is 60 to 65%. The left ventricle has normal function. The left ventricle has no regional wall motion abnormalities. There is mild asymmetric left ventricular hypertrophy of the basal-septal segment. Left ventricular diastolic parameters are indeterminate.   2. Right ventricular systolic function is  normal. The right ventricular size is normal. Tricuspid regurgitation signal is inadequate for assessing PA pressure.   3. The mitral valve is normal in structure. Trivial mitral valve regurgitation. No evidence of mitral stenosis.   4. The aortic valve is normal in structure. Aortic valve regurgitation is not visualized. No aortic stenosis is present.    Assessment and Plan     #) parox AFib #) aflutter S/p fib, flutter ablation  07/2021 Discussed AAD options, specifically flecainide , propafenone, and multaq. Given that she tolerated flecainide  well in the past, will start 50mg  flecainide  BID Continue 25mg  coreg  BID ETT in 1-2 weeks Consider redo ablation   #) Hypercoag d/t parox afib CHA2DS2-VASc Score = at least 2 [CHF History: 0, HTN History: 1, Diabetes History: 0, Stroke History: 0, Vascular Disease History: 0, Age Score: 0, Gender Score: 1].  Therefore, the patient's annual risk of stroke is 2.2 %.    Stroke ppx - 5mg  eliquis , appropriately dosed No bleeding concerns Stop PRN ibuprofen use  #) HTN At goal today,  but patient intermittently having dizziness Recommended she monitor BP 2-3 times per week and record measurements. Bring BP log to follow-up appts Continue coreg  as above Continue 1/2 pill of 37-24 maxzide  at this time   #) OSA Encouraged nightly CPAP use Continue to follow-up with neurology      Informed Consent   Shared Decision Making/Informed Consent The risks [chest pain, shortness of breath, cardiac arrhythmias, dizziness, blood pressure fluctuations, myocardial infarction, stroke/transient ischemic attack, and life-threatening complications (estimated to be 1 in 10,000)], benefits (risk stratification, diagnosing coronary artery disease, treatment guidance) and alternatives of an exercise tolerance test were discussed in detail with [name] and she agrees to proceed.      Current medicines are reviewed at length with the patient today.   The patient does not have concerns regarding her medicines.  The following changes were made today:  none  Labs/ tests ordered today include:  No orders of the defined types were placed in this encounter.    Disposition: Follow up with EP APP  in 3 months   Signed, Chantal Needle, NP  11/28/23  6:11 PM  Electrophysiology [facility]

## 2023-11-29 ENCOUNTER — Ambulatory Visit: Attending: Cardiology | Admitting: Cardiology

## 2023-11-30 ENCOUNTER — Encounter (INDEPENDENT_AMBULATORY_CARE_PROVIDER_SITE_OTHER): Payer: Self-pay | Admitting: Family Medicine

## 2023-11-30 ENCOUNTER — Ambulatory Visit (INDEPENDENT_AMBULATORY_CARE_PROVIDER_SITE_OTHER): Admitting: Family Medicine

## 2023-11-30 VITALS — BP 119/77 | HR 89 | Temp 98.2°F | Ht 66.0 in | Wt 217.0 lb

## 2023-11-30 DIAGNOSIS — E66812 Obesity, class 2: Secondary | ICD-10-CM | POA: Diagnosis not present

## 2023-11-30 DIAGNOSIS — Z6835 Body mass index (BMI) 35.0-35.9, adult: Secondary | ICD-10-CM | POA: Diagnosis not present

## 2023-11-30 DIAGNOSIS — E559 Vitamin D deficiency, unspecified: Secondary | ICD-10-CM

## 2023-11-30 DIAGNOSIS — R7303 Prediabetes: Secondary | ICD-10-CM | POA: Diagnosis not present

## 2023-11-30 MED ORDER — VITAMIN D (ERGOCALCIFEROL) 1.25 MG (50000 UNIT) PO CAPS: 50000.0000 [IU] | ORAL_CAPSULE | ORAL | 0 refills | Status: AC

## 2023-11-30 NOTE — Assessment & Plan Note (Signed)
 Patient tolerating prescription strength Vitamin D  with no nausea, vomiting or muscle weakness.  Needs a refill today.  3 month Rx sent in.

## 2023-11-30 NOTE — Assessment & Plan Note (Signed)
 Last A1c in prediabetic range and patient is working on limiting simple carbohydrates and increasing total protein intake.  She is getting bored with options and we discussed calorie and protein criteria for breakfast to give her more flexibility on intake.  We will discuss how she did in terms of compliance on this option at next appointment.

## 2023-11-30 NOTE — Progress Notes (Signed)
 SUBJECTIVE:  Chief Complaint: Obesity  Interim History: Patient rescheduled from last week due to illness. She has an ablation scheduled for July 24th.  She has been 50% on plan and 50% off plan.  She voices that she tries to keep herself in line on her meal plan but on vacation she struggles to stay consistently. Besides the ablation patient has nothing else coming up.   [name] Ross is here to discuss her progress with her obesity treatment plan. She is on the Category 3 Plan and states she is following her eating plan approximately 50 % of the time. She states she is exercising 10 minutes 1-2 times per week.   OBJECTIVE: Visit Diagnoses: Problem List Items Addressed This Visit       Other   Prediabetes   Vitamin D  deficiency - Primary   Relevant Medications   Vitamin D , Ergocalciferol , (DRISDOL ) 1.25 MG (50000 UNIT) CAPS capsule   Other Visit Diagnoses       Class 2 severe obesity due to excess calories with serious comorbidity and body mass index (BMI) of 36.0 to 36.9 in adult (HCC)         BMI 35.0-35.9,adult           Vitals Temp: 98.2 F (36.8 C) BP: 119/77 Pulse Rate: 89 SpO2: 98 %   Anthropometric Measurements Height: 5' 6 (1.676 m) Weight: 217 lb (98.4 kg) BMI (Calculated): 35.04 Weight at Last Visit: 221 lb Weight Lost Since Last Visit: 4 Weight Gained Since Last Visit: 0 Starting Weight: 228 lb Total Weight Loss (lbs): 11 lb (4.99 kg)   Body Composition  Body Fat %: 43.9 % Fat Mass (lbs): 95.4 lbs Muscle Mass (lbs): 115.8 lbs Total Body Water (lbs): 79 lbs Visceral Fat Rating : 12   Other Clinical Data Today's Visit #: 3 Starting Date: 09/22/23 Comments: Cat 3     ASSESSMENT AND PLAN: Assessment & Plan Vitamin D  deficiency Patient tolerating prescription strength Vitamin D  with no nausea, vomiting or muscle weakness.  Needs a refill today.  3 month Rx sent in.   Prediabetes Last A1c in prediabetic range and patient is working on  limiting simple carbohydrates and increasing total protein intake.  She is getting bored with options and we discussed calorie and protein criteria for breakfast to give her more flexibility on intake.  We will discuss how she did in terms of compliance on this option at next appointment.  Class 2 severe obesity due to excess calories with serious comorbidity and body mass index (BMI) of 36.0 to 36.9 in adult (HCC)  BMI 35.0-35.9,adult    Diet: [name] Ross is currently in the action stage of change. As such, her goal is to continue with weight loss efforts and has agreed to the Category 3 Plan and keeping a food journal and adhering to recommended goals of 250-400 calories and 25 or more grams of protein for breakfast.   Exercise:  For substantial health benefits, adults should do at least 150 minutes (2 hours and 30 minutes) a week of moderate-intensity, or 75 minutes (1 hour and 15 minutes) a week of vigorous-intensity aerobic physical activity, or an equivalent combination of moderate- and vigorous-intensity aerobic activity. Aerobic activity should be performed in episodes of at least 10 minutes, and preferably, it should be spread throughout the week.  Behavior Modification:  We discussed the following Behavioral Modification Strategies today: increasing lean protein intake, meal planning and cooking strategies, keeping healthy foods in the home, and avoiding temptations.  Return in about 3 weeks (around 12/21/2023).   She was informed of the importance of frequent follow up visits to maximize her success with intensive lifestyle modifications for her multiple health conditions.  Attestation Statements:   Reviewed by clinician on day of visit: allergies, medications, problem list, medical history, surgical history, family history, social history, and previous encounter notes.   Adelita Cho, MD

## 2023-12-08 ENCOUNTER — Other Ambulatory Visit (HOSPITAL_COMMUNITY): Payer: Self-pay

## 2023-12-08 ENCOUNTER — Telehealth (HOSPITAL_COMMUNITY): Payer: Self-pay

## 2023-12-08 ENCOUNTER — Other Ambulatory Visit: Payer: Self-pay | Admitting: Cardiology

## 2023-12-08 DIAGNOSIS — Z5181 Encounter for therapeutic drug level monitoring: Secondary | ICD-10-CM

## 2023-12-08 DIAGNOSIS — I4891 Unspecified atrial fibrillation: Secondary | ICD-10-CM

## 2023-12-08 NOTE — Telephone Encounter (Signed)
 Attempted to reach patient to discuss upcoming procedure, no answer. Left VM for patient to return call.

## 2023-12-08 NOTE — Telephone Encounter (Signed)
 Patient returned call to discuss upcoming procedure.   CT: completed.  Labs: to be completed by 7/18.   Any recent signs of acute illness or been started on antibiotics? No Any new medications started? No Any medications to hold? Hold Semaglutide  for 7 days prior to your procedure- last dose July 14. Any missed doses of blood thinner? No Advised patient to continue taking ANTICOAGULANT: Eliquis  (Apixaban ) twice daily without missing any doses.  Medication instructions:  On the morning of your procedure DO NOT take any medication., including Eliquis  or the procedure may be rescheduled. Nothing to eat or drink after midnight prior to your procedure.  Confirmed patient is scheduled for Atrial Fibrillation Ablation on Thursday, July 24 with Dr. Ole Holts. Instructed patient to arrive at the Main Entrance A at [facility]: [address] [place], [place] [address] and check in at Admitting at 5:30 AM.  Advised of plan to go home the same day and will only stay overnight if medically necessary. You MUST have a responsible adult to drive you home and MUST be with you the first 24 hours after you arrive home or your procedure could be cancelled.  Patient verbalized understanding to all instructions provided and agreed to proceed with procedure.

## 2023-12-10 ENCOUNTER — Ambulatory Visit: Admitting: Cardiology

## 2023-12-10 DIAGNOSIS — I4819 Other persistent atrial fibrillation: Secondary | ICD-10-CM | POA: Diagnosis not present

## 2023-12-10 DIAGNOSIS — I483 Typical atrial flutter: Secondary | ICD-10-CM | POA: Diagnosis not present

## 2023-12-11 LAB — COMPREHENSIVE METABOLIC PANEL WITH GFR
ALT: 15 IU/L (ref 0–32)
AST: 16 IU/L (ref 0–40)
Albumin: 3.9 g/dL (ref 3.8–4.9)
Alkaline Phosphatase: 36 IU/L — ABNORMAL LOW (ref 44–121)
BUN/Creatinine Ratio: 27 — ABNORMAL HIGH (ref 9–23)
BUN: 18 mg/dL (ref 6–24)
Bilirubin Total: <0.2 mg/dL (ref 0.0–1.2)
CO2: 23 mmol/L (ref 20–29)
Calcium: 8.8 mg/dL (ref 8.7–10.2)
Chloride: 100 mmol/L (ref 96–106)
Creatinine, Ser: 0.67 mg/dL (ref 0.57–1.00)
Globulin, Total: 2.4 g/dL (ref 1.5–4.5)
Glucose: 88 mg/dL (ref 70–99)
Potassium: 4.1 mmol/L (ref 3.5–5.2)
Sodium: 138 mmol/L (ref 134–144)
Total Protein: 6.3 g/dL (ref 6.0–8.5)
eGFR: 101 mL/min/1.73 (ref >59)

## 2023-12-11 LAB — CBC
Hematocrit: 39.4 % (ref 34.0–46.6)
Hemoglobin: 12.9 g/dL (ref 11.1–15.9)
MCH: 29.7 pg (ref 26.6–33.0)
MCHC: 32.7 g/dL (ref 31.5–35.7)
MCV: 91 fL (ref 79–97)
Platelets: 455 x10E3/uL — ABNORMAL HIGH (ref 150–450)
RBC: 4.34 x10E6/uL (ref 3.77–5.28)
RDW: 13.2 % (ref 11.7–15.4)
WBC: 5.7 x10E3/uL (ref 3.4–10.8)

## 2023-12-11 LAB — BASIC METABOLIC PANEL WITH GFR
BUN/Creatinine Ratio: 26 — ABNORMAL HIGH (ref 9–23)
BUN: 18 mg/dL (ref 6–24)
CO2: 22 mmol/L (ref 20–29)
Calcium: 8.9 mg/dL (ref 8.7–10.2)
Chloride: 100 mmol/L (ref 96–106)
Creatinine, Ser: 0.7 mg/dL (ref 0.57–1.00)
Glucose: 90 mg/dL (ref 70–99)
Potassium: 4.2 mmol/L (ref 3.5–5.2)
Sodium: 137 mmol/L (ref 134–144)
eGFR: 100 mL/min/1.73 (ref >59)

## 2023-12-11 LAB — T4, FREE: Free T4: 1.3 ng/dL (ref 0.82–1.77)

## 2023-12-11 LAB — TSH: TSH: 2.77 u[IU]/mL (ref 0.450–4.500)

## 2023-12-12 ENCOUNTER — Ambulatory Visit: Payer: Self-pay | Admitting: Cardiology

## 2023-12-15 NOTE — Pre-Procedure Instructions (Signed)
 Instructed patient on the following items: Arrival time 0515 Nothing to eat or drink after midnight No meds AM of procedure Responsible person to drive you home and stay with you for 24 hrs  Have you missed any doses of anti-coagulant Eliquis- takes twice a day, hasn't missed any doses in last 4 weeks.  Don't take dose morning of procedure.

## 2023-12-16 ENCOUNTER — Other Ambulatory Visit: Payer: Self-pay

## 2023-12-16 ENCOUNTER — Ambulatory Visit (HOSPITAL_COMMUNITY): Payer: Self-pay | Admitting: Certified Registered"

## 2023-12-16 ENCOUNTER — Ambulatory Visit (HOSPITAL_COMMUNITY)
Admission: RE | Disposition: A | Discharge status: Home / Self Care | Payer: Self-pay | Source: Home / Self Care | Attending: Cardiology

## 2023-12-16 ENCOUNTER — Ambulatory Visit (HOSPITAL_BASED_OUTPATIENT_CLINIC_OR_DEPARTMENT_OTHER): Payer: Self-pay | Admitting: Certified Registered"

## 2023-12-16 ENCOUNTER — Other Ambulatory Visit (HOSPITAL_COMMUNITY): Payer: Self-pay

## 2023-12-16 ENCOUNTER — Encounter (HOSPITAL_COMMUNITY): Payer: Self-pay | Admitting: Cardiology

## 2023-12-16 ENCOUNTER — Ambulatory Visit (HOSPITAL_COMMUNITY)
Admission: RE | Admit: 2023-12-16 | Discharge: 2023-12-16 | Disposition: A | Discharge status: Home / Self Care | Attending: Cardiology | Admitting: Cardiology

## 2023-12-16 DIAGNOSIS — I10 Essential (primary) hypertension: Secondary | ICD-10-CM | POA: Diagnosis not present

## 2023-12-16 DIAGNOSIS — K219 Gastro-esophageal reflux disease without esophagitis: Secondary | ICD-10-CM | POA: Diagnosis not present

## 2023-12-16 DIAGNOSIS — E119 Type 2 diabetes mellitus without complications: Secondary | ICD-10-CM | POA: Diagnosis not present

## 2023-12-16 DIAGNOSIS — Z79899 Other long term (current) drug therapy: Secondary | ICD-10-CM | POA: Diagnosis not present

## 2023-12-16 DIAGNOSIS — I4819 Other persistent atrial fibrillation: Secondary | ICD-10-CM | POA: Diagnosis not present

## 2023-12-16 DIAGNOSIS — I483 Typical atrial flutter: Secondary | ICD-10-CM | POA: Diagnosis not present

## 2023-12-16 DIAGNOSIS — I4891 Unspecified atrial fibrillation: Secondary | ICD-10-CM

## 2023-12-16 HISTORY — PX: ATRIAL FIBRILLATION ABLATION: EP1191

## 2023-12-16 LAB — GLUCOSE, CAPILLARY
Glucose-Capillary: 115 mg/dL — ABNORMAL HIGH (ref 70–99)
Glucose-Capillary: 121 mg/dL — ABNORMAL HIGH (ref 70–99)

## 2023-12-16 LAB — POCT ACTIVATED CLOTTING TIME: Activated Clotting Time: 239 s

## 2023-12-16 SURGERY — ATRIAL FIBRILLATION ABLATION
Anesthesia: General

## 2023-12-16 MED ORDER — ATROPINE SULFATE 1 MG/10ML IJ SOSY
PREFILLED_SYRINGE | INTRAMUSCULAR | Status: DC | PRN
  Administered 2023-12-16: 1 mg via INTRAVENOUS

## 2023-12-16 MED ORDER — SODIUM CHLORIDE 0.9% FLUSH: 3.0000 mL | Freq: Two times a day (BID) | INTRAVENOUS | Status: DC

## 2023-12-16 MED ORDER — COLCHICINE 0.6 MG PO TABS
0.6000 mg | ORAL_TABLET | Freq: Two times a day (BID) | ORAL | Status: DC
  Administered 2023-12-16: 0.6 mg via ORAL
  Filled 2023-12-16: qty 1

## 2023-12-16 MED ORDER — PROTAMINE SULFATE 10 MG/ML IV SOLN
INTRAVENOUS | Status: DC | PRN
  Administered 2023-12-16: 35 mg via INTRAVENOUS

## 2023-12-16 MED ORDER — PANTOPRAZOLE SODIUM 40 MG PO TBEC
40.0000 mg | DELAYED_RELEASE_TABLET | Freq: Every day | ORAL | 0 refills | Status: AC
  Filled 2023-12-16: qty 30, 30d supply, fill #0

## 2023-12-16 MED ORDER — HEPARIN (PORCINE) IN NACL 1000-0.9 UT/500ML-% IV SOLN
INTRAVENOUS | Status: DC | PRN
Start: 2023-12-16 — End: 2023-12-16
  Administered 2023-12-16 (×3): 500 mL

## 2023-12-16 MED ORDER — FENTANYL CITRATE (PF) 100 MCG/2ML IJ SOLN
INTRAMUSCULAR | Status: AC
  Filled 2023-12-16: qty 2

## 2023-12-16 MED ORDER — SODIUM CHLORIDE 0.9% FLUSH: 3.0000 mL | INTRAVENOUS | Status: DC | PRN

## 2023-12-16 MED ORDER — ONDANSETRON HCL 4 MG/2ML IJ SOLN
INTRAMUSCULAR | Status: DC | PRN
  Administered 2023-12-16: 4 mg via INTRAVENOUS

## 2023-12-16 MED ORDER — COLCHICINE 0.6 MG PO TABS
0.6000 mg | ORAL_TABLET | Freq: Two times a day (BID) | ORAL | 0 refills | Status: DC
Start: 2023-12-16 — End: 2024-03-22
  Filled 2023-12-16: qty 10, 5d supply, fill #0

## 2023-12-16 MED ORDER — PROPOFOL 10 MG/ML IV BOLUS
INTRAVENOUS | Status: DC | PRN
  Administered 2023-12-16: 160 mg via INTRAVENOUS

## 2023-12-16 MED ORDER — ROCURONIUM BROMIDE 10 MG/ML (PF) SYRINGE
PREFILLED_SYRINGE | INTRAVENOUS | Status: DC | PRN
  Administered 2023-12-16: 20 mg via INTRAVENOUS
  Administered 2023-12-16: 80 mg via INTRAVENOUS

## 2023-12-16 MED ORDER — PROPOFOL 500 MG/50ML IV EMUL
INTRAVENOUS | Status: DC | PRN
  Administered 2023-12-16: 150 ug/kg/min via INTRAVENOUS

## 2023-12-16 MED ORDER — ONDANSETRON HCL 4 MG/2ML IJ SOLN: 4.0000 mg | Freq: Four times a day (QID) | INTRAMUSCULAR | Status: DC | PRN

## 2023-12-16 MED ORDER — DEXAMETHASONE SODIUM PHOSPHATE 10 MG/ML IJ SOLN
INTRAMUSCULAR | Status: DC | PRN
  Administered 2023-12-16: 5 mg via INTRAVENOUS

## 2023-12-16 MED ORDER — PHENYLEPHRINE HCL-NACL 20-0.9 MG/250ML-% IV SOLN
INTRAVENOUS | Status: DC | PRN
  Administered 2023-12-16: 40 ug/min via INTRAVENOUS

## 2023-12-16 MED ORDER — HEPARIN SODIUM (PORCINE) 1000 UNIT/ML IJ SOLN
INTRAMUSCULAR | Status: DC | PRN
  Administered 2023-12-16: 15000 [IU] via INTRAVENOUS
  Administered 2023-12-16: 7000 [IU] via INTRAVENOUS

## 2023-12-16 MED ORDER — SUGAMMADEX SODIUM 200 MG/2ML IV SOLN
INTRAVENOUS | Status: DC | PRN
  Administered 2023-12-16: 200 mg via INTRAVENOUS

## 2023-12-16 MED ORDER — PHENYLEPHRINE 80 MCG/ML (10ML) SYRINGE FOR IV PUSH (FOR BLOOD PRESSURE SUPPORT)
PREFILLED_SYRINGE | INTRAVENOUS | Status: DC | PRN
  Administered 2023-12-16: 160 ug via INTRAVENOUS
  Administered 2023-12-16: 80 ug via INTRAVENOUS

## 2023-12-16 MED ORDER — ACETAMINOPHEN 325 MG PO TABS
650.0000 mg | ORAL_TABLET | ORAL | Status: DC | PRN
  Administered 2023-12-16: 650 mg via ORAL
  Filled 2023-12-16: qty 2

## 2023-12-16 MED ORDER — SODIUM CHLORIDE 0.9 % IV SOLN: 250.0000 mL | INTRAVENOUS | Status: DC | PRN

## 2023-12-16 MED ORDER — LIDOCAINE 2% (20 MG/ML) 5 ML SYRINGE
INTRAMUSCULAR | Status: DC | PRN
  Administered 2023-12-16: 60 mg via INTRAVENOUS

## 2023-12-16 MED ORDER — PANTOPRAZOLE SODIUM 40 MG PO TBEC
40.0000 mg | DELAYED_RELEASE_TABLET | Freq: Every day | ORAL | Status: DC
  Administered 2023-12-16: 40 mg via ORAL
  Filled 2023-12-16: qty 1

## 2023-12-16 MED ORDER — APIXABAN 5 MG PO TABS
5.0000 mg | ORAL_TABLET | Freq: Two times a day (BID) | ORAL | Status: DC
  Administered 2023-12-16: 5 mg via ORAL
  Filled 2023-12-16: qty 1

## 2023-12-16 MED ORDER — AMISULPRIDE (ANTIEMETIC) 5 MG/2ML IV SOLN
INTRAVENOUS | Status: DC | PRN
  Administered 2023-12-16: 10 mg via INTRAVENOUS

## 2023-12-16 MED ORDER — SODIUM CHLORIDE 0.9 % IV SOLN: INTRAVENOUS | Status: DC

## 2023-12-16 MED ORDER — FENTANYL CITRATE (PF) 250 MCG/5ML IJ SOLN
INTRAMUSCULAR | Status: DC | PRN
  Administered 2023-12-16: 100 ug via INTRAVENOUS

## 2023-12-16 MED ORDER — AMISULPRIDE (ANTIEMETIC) 5 MG/2ML IV SOLN
INTRAVENOUS | Status: AC
  Filled 2023-12-16: qty 2

## 2023-12-16 SURGICAL SUPPLY — 18 items
BAG SNAP BAND KOVER 36X36 (MISCELLANEOUS) IMPLANT
CABLE FARASTAR GEN2 SNGL USE (CABLE) IMPLANT
CATH FARAWAVE 2.0 31 (CATHETERS) IMPLANT
CATH GE 8FR SOUNDSTAR (CATHETERS) IMPLANT
CATH OCTARAY 2.0 F 3-3-3-3-3 (CATHETERS) IMPLANT
CATH WEBSTER BI DIR CS D-F CRV (CATHETERS) IMPLANT
CLOSURE PERCLOSE PROSTYLE (VASCULAR PRODUCTS) IMPLANT
COVER SWIFTLINK CONNECTOR (BAG) ×1 IMPLANT
DILATOR VESSEL 38 20CM 16FR (INTRODUCER) IMPLANT
GUIDEWIRE INQWIRE 1.5J.035X260 (WIRE) IMPLANT
KIT VERSACROSS CNCT FARADRIVE (KITS) IMPLANT
PACK EP LF (CUSTOM PROCEDURE TRAY) ×1 IMPLANT
PAD DEFIB RADIO PHYSIO CONN (PAD) ×1 IMPLANT
PATCH CARTO3 (PAD) IMPLANT
SHEATH FARADRIVE STEERABLE (SHEATH) IMPLANT
SHEATH PINNACLE 8F 10CM (SHEATH) IMPLANT
SHEATH PINNACLE 9F 10CM (SHEATH) IMPLANT
SHEATH PROBE COVER 6X72 (BAG) IMPLANT

## 2023-12-16 NOTE — Anesthesia Procedure Notes (Signed)
 Procedure Name: Intubation Date/Time: 12/16/2023 7:41 AM  Performed by: Delores Dus, CRNAPre-anesthesia Checklist: Patient identified, Emergency Drugs available, Suction available and Patient being monitored Patient Re-evaluated:Patient Re-evaluated prior to induction Oxygen Delivery Method: Circle system utilized Preoxygenation: Pre-oxygenation with 100% oxygen Induction Type: IV induction Ventilation: Mask ventilation without difficulty Laryngoscope Size: Miller and 2 Grade View: Grade I Tube type: Oral Tube size: 7.0 mm Number of attempts: 1 Airway Equipment and Method: Stylet and Oral airway Placement Confirmation: ETT inserted through vocal cords under direct vision, positive ETCO2 and breath sounds checked- equal and bilateral Secured at: 22 cm Tube secured with: Tape Dental Injury: Teeth and Oropharynx as per pre-operative assessment

## 2023-12-16 NOTE — H&P (Signed)
 Electrophysiology Office Follow up Visit Note:     Date:  12/16/2023    ID:  [name] Ross, DOB 1966/04/05, MRN [id number]   PCP:  Yolande Toribio MATSU, MD            [facility] Cardiologist:  Redell Cave, MD  [facility] Electrophysiologist:  OLE ONEIDA HOLTS, MD      Interval History:       [name] Ross is a 58 y.o. female who presents for a follow up visit.    The patient last saw Elvie in clinic September 02, 2023.  She has a history of atrial fibrillation and flutter, hypertension, sleep apnea.  She had a prior catheter ablation in March 2023 during which the pulmonary veins, posterior wall and CTI were ablated.  At the appointment with Elvie the patient reported multiple symptomatic episodes of atrial fibrillation.  She was started on flecainide  at that appointment and continued on her twice daily carvedilol .   She was seen in the emergency department on Sep 24, 2023 with symptomatic atrial fibrillation.  She was started on amiodarone .   She tells me she had an excellent result after her prior catheter ablation that lasted until 1 month ago.  1 month ago she began having more atrial fibrillation.  She is not locked in A-fib but goes in and out.  She is symptomatic with fatigue and shortness of breath and palpitations when out of rhythm.  She takes Eliquis  twice daily for stroke prophylaxis.  She continues to take amiodarone  for as prescribed after her recent hospital visit.   Presents for AF ablation.     Objective Past medical, surgical, social and family history were reviewed.   ROS:   Please see the history of present illness.    All other systems reviewed and are negative.   EKGs/Labs/Other Studies Reviewed:     The following studies were reviewed today:   September 14, 2023 ETT No QRS widening or conduction abnormalities with stress   July 04, 2022 ZIO monitor No atrial fibrillation   Sep 24, 2023 EKG reviewed and shows atrial flutter with variable AV  conduction.   September 19, 2023 EKG shows sinus rhythm   September 19, 2023 EKG #2 shows atrial fibrillation with a rapid ventricular rate   April 07, 2021 echo EF 60%     EKG Interpretation Date/Time:                  Wednesday Sep 29 2023 11:23:20 EDT Ventricular Rate:         131 PR Interval:                   QRS Duration:             76 QT Interval:                 352 QTC Calculation:519 R Axis:                         53   Text Interpretation:Atrial fibrillation with rapid ventricular response Low voltage QRS Confirmed by HOLTS OLE [phone]) on 09/29/2023 11:31:57 AM     Physical Exam:     VS:  BP 122/80 (BP Location: Left Arm, Patient Position: Sitting, Cuff Size: Large)   Pulse 77   Ht 5' 6 (1.676 m)   Wt 225 lb (102.1 kg)   LMP 02/13/2015   SpO2 96%   BMI 36.32 kg/m  Wt Readings from Last 3 Encounters:  09/29/23 225 lb (102.1 kg)  09/24/23 225 lb (102.1 kg)  09/22/23 228 lb (103.4 kg)      GEN: no distress CARD: rrr, tachycardic, No MRG RESP: No IWOB. CTAB.     Assessment ASSESSMENT:     1. Typical atrial flutter (HCC)   2. Persistent atrial fibrillation (HCC)     PLAN:     In order of problems listed above:   #Persistent atrial fibrillation flutter #High risk med monitoring-amiodarone  Initially did well after a March 2023 catheter ablation during which the pulmonary veins, posterior wall and CTI were ablated.  Now with symptomatic recurrence.  I discussed treatment options with the patient including alternative antiarrhythmic drugs and redo catheter ablation.  She is in favor of redo catheter ablation which I think is the best strategy.     Plan for amiodarone  to be a short-term medication only.   Discussed treatment options today for AF including antiarrhythmic drug therapy and ablation. Discussed risks, recovery and likelihood of success with each treatment strategy. Risk, benefits, and alternatives to EP study and ablation for afib were  discussed. These risks include but are not limited to stroke, bleeding, vascular damage, tamponade, perforation, damage to the esophagus, lungs, phrenic nerve and other structures, pulmonary vein stenosis, worsening renal function, coronary vasospasm and death.  Discussed potential need for repeat ablation procedures and antiarrhythmic drugs after an initial ablation. The patient understands these risk and wishes to proceed.  We will therefore proceed with catheter ablation at the next available time.  Carto, ICE, anesthesia are requested for the procedure.  Will also obtain CT PV protocol prior to the procedure to exclude LAA thrombus and further evaluate atrial anatomy.     Presents for AF ablation. Procedure reviewed.     Signed, Ole Holts, MD, [facility], [facility] 12/16/2023 Electrophysiology [facility]

## 2023-12-16 NOTE — Progress Notes (Signed)
 Nauseated on arrival to recovery. CRNA administered Barhemsys 

## 2023-12-16 NOTE — Transfer of Care (Signed)
 Immediate Anesthesia Transfer of Care Note  Patient: [name] Ross  Procedure(s) Performed: ATRIAL FIBRILLATION ABLATION  Patient Location: PACU and Cath Lab  Anesthesia Type:General  Level of Consciousness: awake, alert , and oriented  Airway & Oxygen Therapy: Patient Spontanous Breathing and Patient connected to nasal cannula oxygen  Post-op Assessment: Report given to RN and Post -op Vital signs reviewed and stable  Post vital signs: Reviewed and stable  Last Vitals:  Vitals Value Taken Time  BP 122/76 12/16/23 09:10  Temp 36.8 C 12/16/23 09:10  Pulse 80 12/16/23 09:11  Resp 21 12/16/23 09:11  SpO2 95 % 12/16/23 09:11  Vitals shown include unfiled device data.  Last Pain:  Vitals:   12/16/23 0910  TempSrc: Axillary  PainSc: 2          Complications: No notable events documented.

## 2023-12-16 NOTE — Progress Notes (Signed)
 Patient walked to the bathroom without difficulties. Bilateral groins level 0, clean, dry, and intact.

## 2023-12-16 NOTE — Discharge Instructions (Signed)

## 2023-12-16 NOTE — Anesthesia Preprocedure Evaluation (Signed)
 Anesthesia Evaluation  Patient identified by MRN, date of birth, ID band Patient awake    Reviewed: Allergy & Precautions, NPO status , Patient's Chart, lab work & pertinent test results  History of Anesthesia Complications (+) PONV and history of anesthetic complications  Airway Mallampati: III  TM Distance: >3 FB Neck ROM: Full    Dental  (+) Dental Advisory Given, Teeth Intact   Pulmonary neg shortness of breath, sleep apnea and Continuous Positive Airway Pressure Ventilation , neg COPD, neg recent URI   breath sounds clear to auscultation       Cardiovascular hypertension, Pt. on medications (-) angina (-) Past MI + dysrhythmias Atrial Fibrillation  Rhythm:Regular  1. Left ventricular ejection fraction, by estimation, is 60 to 65%. The  left ventricle has normal function. The left ventricle has no regional  wall motion abnormalities. There is mild asymmetric left ventricular  hypertrophy of the basal-septal segment.  Left ventricular diastolic parameters are indeterminate.   2. Right ventricular systolic function is normal. The right ventricular  size is normal. Tricuspid regurgitation signal is inadequate for assessing  PA pressure.   3. The mitral valve is normal in structure. Trivial mitral valve  regurgitation. No evidence of mitral stenosis.   4. The aortic valve is normal in structure. Aortic valve regurgitation is  not visualized. No aortic stenosis is present.      Neuro/Psych neg Seizures PSYCHIATRIC DISORDERS Anxiety Depression       GI/Hepatic Neg liver ROS,GERD  Medicated and Controlled,,  Endo/Other  diabetes, Type 2    Renal/GU negative Renal ROS     Musculoskeletal negative musculoskeletal ROS (+)    Abdominal   Peds  Hematology Lab Results      Component                Value               Date                      WBC                      5.7                 12/10/2023                HGB                       12.9                12/10/2023                HCT                      39.4                12/10/2023                MCV                      91                  12/10/2023                PLT                      455 (H)  12/10/2023            eliquis    Anesthesia Other Findings Ozempic  > 1 week  Reproductive/Obstetrics                              Anesthesia Physical Anesthesia Plan  ASA: 2  Anesthesia Plan: General   Post-op Pain Management: Minimal or no pain anticipated   Induction: Intravenous  PONV Risk Score and Plan: 4 or greater and Ondansetron , Dexamethasone , Propofol  infusion, TIVA and Midazolam   Airway Management Planned: Oral ETT  Additional Equipment: None  Intra-op Plan:   Post-operative Plan: Extubation in OR  Informed Consent: I have reviewed the patients History and Physical, chart, labs and discussed the procedure including the risks, benefits and alternatives for the proposed anesthesia with the patient or authorized representative who has indicated his/her understanding and acceptance.     Dental advisory given  Plan Discussed with: CRNA  Anesthesia Plan Comments:          Anesthesia Quick Evaluation

## 2023-12-17 ENCOUNTER — Encounter: Payer: Self-pay | Admitting: Cardiology

## 2023-12-17 ENCOUNTER — Telehealth (HOSPITAL_COMMUNITY): Payer: Self-pay

## 2023-12-17 ENCOUNTER — Emergency Department (HOSPITAL_COMMUNITY)

## 2023-12-17 ENCOUNTER — Emergency Department (HOSPITAL_COMMUNITY)
Admission: EM | Admit: 2023-12-17 | Discharge: 2023-12-18 | Disposition: A | Discharge status: Home / Self Care | Attending: Emergency Medicine | Admitting: Emergency Medicine

## 2023-12-17 DIAGNOSIS — Z7901 Long term (current) use of anticoagulants: Secondary | ICD-10-CM | POA: Diagnosis not present

## 2023-12-17 DIAGNOSIS — H532 Diplopia: Secondary | ICD-10-CM | POA: Diagnosis not present

## 2023-12-17 DIAGNOSIS — H539 Unspecified visual disturbance: Secondary | ICD-10-CM | POA: Insufficient documentation

## 2023-12-17 DIAGNOSIS — Z743 Need for continuous supervision: Secondary | ICD-10-CM | POA: Diagnosis not present

## 2023-12-17 DIAGNOSIS — I10 Essential (primary) hypertension: Secondary | ICD-10-CM | POA: Diagnosis not present

## 2023-12-17 DIAGNOSIS — E042 Nontoxic multinodular goiter: Secondary | ICD-10-CM | POA: Diagnosis not present

## 2023-12-17 DIAGNOSIS — H538 Other visual disturbances: Secondary | ICD-10-CM | POA: Diagnosis not present

## 2023-12-17 DIAGNOSIS — R29818 Other symptoms and signs involving the nervous system: Secondary | ICD-10-CM | POA: Diagnosis not present

## 2023-12-17 LAB — CBG MONITORING, ED: Glucose-Capillary: 87 mg/dL (ref 70–99)

## 2023-12-17 LAB — CBC WITH DIFFERENTIAL/PLATELET
Abs Immature Granulocytes: 0.12 K/uL — ABNORMAL HIGH (ref 0.00–0.07)
Basophils Absolute: 0.1 K/uL (ref 0.0–0.1)
Basophils Relative: 1 %
Eosinophils Absolute: 0.6 K/uL — ABNORMAL HIGH (ref 0.0–0.5)
Eosinophils Relative: 7 %
HCT: 35.3 % — ABNORMAL LOW (ref 36.0–46.0)
Hemoglobin: 12 g/dL (ref 12.0–15.0)
Immature Granulocytes: 1 %
Lymphocytes Relative: 22 %
Lymphs Abs: 2 K/uL (ref 0.7–4.0)
MCH: 30.1 pg (ref 26.0–34.0)
MCHC: 34 g/dL (ref 30.0–36.0)
MCV: 88.5 fL (ref 80.0–100.0)
Monocytes Absolute: 0.6 K/uL (ref 0.1–1.0)
Monocytes Relative: 6 %
Neutro Abs: 5.5 K/uL (ref 1.7–7.7)
Neutrophils Relative %: 63 %
Platelets: 350 K/uL (ref 150–400)
RBC: 3.99 MIL/uL (ref 3.87–5.11)
RDW: 14 % (ref 11.5–15.5)
WBC: 8.8 K/uL (ref 4.0–10.5)
nRBC: 0 % (ref 0.0–0.2)

## 2023-12-17 LAB — COMPREHENSIVE METABOLIC PANEL WITH GFR
ALT: 12 U/L (ref 0–44)
AST: 24 U/L (ref 15–41)
Albumin: 3.2 g/dL — ABNORMAL LOW (ref 3.5–5.0)
Alkaline Phosphatase: 26 U/L — ABNORMAL LOW (ref 38–126)
Anion gap: 9 (ref 5–15)
BUN: 14 mg/dL (ref 6–20)
CO2: 25 mmol/L (ref 22–32)
Calcium: 8.9 mg/dL (ref 8.9–10.3)
Chloride: 104 mmol/L (ref 98–111)
Creatinine, Ser: 0.68 mg/dL (ref 0.44–1.00)
GFR, Estimated: >60 mL/min (ref >60)
Glucose, Bld: 84 mg/dL (ref 70–99)
Potassium: 3.5 mmol/L (ref 3.5–5.1)
Sodium: 138 mmol/L (ref 135–145)
Total Bilirubin: 0.5 mg/dL (ref 0.0–1.2)
Total Protein: 6.2 g/dL — ABNORMAL LOW (ref 6.5–8.1)

## 2023-12-17 MED ORDER — LACTATED RINGERS IV BOLUS
500.0000 mL | Freq: Once | INTRAVENOUS | Status: AC
  Administered 2023-12-17: 500 mL via INTRAVENOUS

## 2023-12-17 MED ORDER — IOHEXOL 350 MG/ML SOLN
75.0000 mL | Freq: Once | INTRAVENOUS | Status: AC | PRN
  Administered 2023-12-17: 75 mL via INTRAVENOUS

## 2023-12-17 MED FILL — Fentanyl Citrate Preservative Free (PF) Inj 100 MCG/2ML: INTRAMUSCULAR | Qty: 2 | Status: AC

## 2023-12-17 NOTE — ED Notes (Signed)
 Patient transported to CT

## 2023-12-17 NOTE — ED Notes (Signed)
 Patient ambulated to restroom independently.

## 2023-12-17 NOTE — Anesthesia Postprocedure Evaluation (Signed)
 Anesthesia Post Note  Patient: [name] Ross  Procedure(s) Performed: ATRIAL FIBRILLATION ABLATION     Patient location during evaluation: PACU Anesthesia Type: General Level of consciousness: awake and alert Pain management: pain level controlled Vital Signs Assessment: post-procedure vital signs reviewed and stable Respiratory status: spontaneous breathing, nonlabored ventilation and respiratory function stable Cardiovascular status: blood pressure returned to baseline and stable Postop Assessment: no apparent nausea or vomiting Anesthetic complications: no   No notable events documented.                  Joselinne Lawal

## 2023-12-17 NOTE — Telephone Encounter (Signed)
 Spoke with patient to complete post procedure follow up call.  Patient reports no complications with groin sites.   Instructions reviewed with patient:  Remove large bandage at puncture site after 24 hours. It is normal to have bruising, tenderness, mild swelling, and a pea or marble sized lump/knot at the groin site which can take up to three months to resolve.  Get help right away if you notice sudden swelling at the puncture site.  Check your puncture site every day for signs of infection: fever, redness, swelling, pus drainage, warmth, foul odor or excessive pain. If this occurs, please call the office at [phone], to speak with the nurse. Get help right away if your puncture site is bleeding and the bleeding does not stop after applying firm pressure to the area.  You may continue to have skipped beats/ atrial fibrillation during the first several months after your procedure.  It is very important not to miss any doses of your blood thinner Eliquis .   You will follow up with the APP on 01/18/24 and follow up with the APP on 03/20/24.   Patient verbalized understanding to all instructions provided.

## 2023-12-17 NOTE — ED Triage Notes (Signed)
 Pt BIB EMS with CC of blurred vision and hypertension. Pt reports atrial ablation for A-Fib yesterday. Aox4. Denies CP/SOB/Dizziness

## 2023-12-17 NOTE — ED Provider Notes (Signed)
 [facility] AT [facility] Provider Note   CSN: [id number] Arrival date & time: 12/17/23  1756     Patient presents with: Hypertension and Blurred Vision   [name] Ross is a 58 y.o. female.   HPI 58 year old female presents with double vision.  Around 5 PM she was preparing food and when she bent her head down to look at her phone she started having double vision.  Looked up and it was still there.  Overall lasted only about 10 seconds.  She states sometime around then she had some transient nonspecific pain or abnormal feeling in her left lateral neck.  No chest pain, headache.  No weakness or numbness in her extremities or trouble speaking.  She had an ablation yesterday for A-fib.  She has been compliant with her Eliquis .  Right now she is asymptomatic.  Prior to Admission medications   Medication Sig Start Date End Date Taking? Authorizing Provider  acetaminophen  (TYLENOL ) 500 MG tablet Take 1,000 mg by mouth every 6 (six) hours as needed for mild pain.    [provider]  albuterol  (VENTOLIN  HFA) 108 (90 Base) MCG/ACT inhaler Inhale 2 puffs into the lungs every 6 (six) hours as needed for wheezing or shortness of breath.    [provider]  amiodarone  (PACERONE ) 200 MG tablet Take 1 tablet (200 mg total) by mouth daily. 10/28/23   Cindie Ole DASEN, MD  apixaban  (ELIQUIS ) 5 MG TABS tablet Take 1 tablet (5 mg total) by mouth 2 (two) times daily. 09/07/23   Darliss Rogue, MD  Ascorbic Acid  (VITAMIN C ) 1000 MG tablet Take 1,000 mg by mouth daily.    [provider]  buPROPion  (WELLBUTRIN  XL) 300 MG 24 hr tablet Take 300 mg by mouth every morning.     [provider]  butalbital -acetaminophen -caffeine  (FIORICET ) 50-325-40 MG tablet Take 1 tablet by mouth 2 (two) times daily as needed for headache.    [provider]  CALCIUM  PO Take 1,200 tablets by mouth daily.    [provider]  carvedilol  (COREG )  25 MG tablet TAKE TWO TABLETS BY MOUTH TWICE DAILY 11/29/23   Abigail Bernardino HERO, PA-C  Cholecalciferol  (VITAMIN D -3 PO) Take 2,000 Units by mouth daily.    [provider]  colchicine  0.6 MG tablet Take 1 tablet (0.6 mg total) by mouth 2 (two) times daily for 5 days. 12/16/23 12/21/23  Lesia Ozell Barter, PA-C  dicyclomine  (BENTYL ) 10 MG capsule Take one tablet by mouth once to twice daily as needed 07/26/23   Mollie Pfeiffer M, PA-C  escitalopram  (LEXAPRO ) 20 MG tablet Take 20 mg by mouth every morning.     [provider]  estradiol  (ESTRACE ) 2 MG tablet Take 2 mg by mouth daily. 10/28/20   [provider]  fenofibrate  micronized (LOFIBRA) 200 MG capsule Take 200 mg by mouth daily before breakfast.  02/04/15   [provider]  fexofenadine (ALLEGRA) 180 MG tablet Take 180 mg by mouth daily.    [provider]  fluticasone  (FLONASE ) 50 MCG/ACT nasal spray Place 1 spray into both nostrils daily.    [provider]  hydrOXYzine  (VISTARIL ) 25 MG capsule Take 25 mg by mouth at bedtime. 09/13/23   [provider]  ibuprofen (ADVIL) 200 MG tablet Take 600-800 mg by mouth every 6 (six) hours as needed for moderate pain.    [provider]  losartan  (COZAAR ) 50 MG tablet Take 50 mg by mouth daily.  [provider]  lubiprostone  (AMITIZA ) 24 MCG capsule Take 1 capsule (24 mcg total) by mouth 2 (two) times daily with a meal. Please schedule a yearly follow up appointment for further refills. Thank you. 07/26/23   McMichael, Nestor HERO, PA-C  magnesium  oxide (MAG-OX) 400 (240 Mg) MG tablet Take 400 mg by mouth daily. 04/21/21   [provider]  meclizine  (ANTIVERT ) 25 MG tablet Take 1 tablet (25 mg total) by mouth 3 (three) times daily as needed for dizziness. 03/08/19   Vila Lauraine CROME., MD  mometasone  (ELOCON ) 0.1 % ointment Apply 1 application. topically 2 (two) times daily as needed (rash). 06/11/20   [provider]   omega-3 acid ethyl esters (LOVAZA ) 1 g capsule Take 1 capsule (1 g total) by mouth 2 (two) times daily. 01/25/20   Visser, Jacquelyn D, PA-C  omeprazole  (PRILOSEC) 40 MG capsule Take 1 capsule (40 mg total) by mouth in the morning and at bedtime. Patient taking differently: Take 40 mg by mouth 2 (two) times daily as needed. 06/10/21   Armbruster, Elspeth SQUIBB, MD  ondansetron  (ZOFRAN -ODT) 8 MG disintegrating tablet Take 8 mg by mouth every 8 (eight) hours as needed for nausea or vomiting. 07/03/21   [provider]  pantoprazole  (PROTONIX ) 40 MG tablet Take 1 tablet (40 mg total) by mouth daily. 12/16/23 01/30/24  Lesia Ozell Barter, PA-C  pramipexole  (MIRAPEX ) 0.75 MG tablet TAKE ONE TABLET BY MOUTH AT BEDTIME 08/27/23   Millikan, Megan, NP  RABEprazole (ACIPHEX) 20 MG tablet Take 20 mg by mouth every morning.    [provider]  Semaglutide , 2 MG/DOSE, (OZEMPIC , 2 MG/DOSE,) 8 MG/3ML SOPN Inject 2 mg into the skin once a week.    [provider]  tacrolimus (PROTOPIC) 0.1 % ointment Apply 1 application. topically 2 (two) times daily as needed (rash). 01/08/21   [provider]  triamcinolone ointment (KENALOG) 0.1 % Apply 1 Application topically 2 (two) times daily as needed. 07/11/23   [provider]  triamterene -hydrochlorothiazide  (MAXZIDE -25) 37.5-25 MG tablet Take 0.5 tablets by mouth daily. 12/06/23   [provider]  valACYclovir (VALTREX) 1000 MG tablet Take 1,000 mg by mouth 2 (two) times daily as needed (cold sores).    [provider]  Vitamin D , Ergocalciferol , (DRISDOL ) 1.25 MG (50000 UNIT) CAPS capsule Take 1 capsule (50,000 Units total) by mouth every 7 (seven) days. 11/30/23   [place] Adelita PENNER, MD  zolpidem  (AMBIEN ) 10 MG tablet Take 10 mg by mouth at bedtime as needed for sleep.    [provider]    Allergies: Metformin hcl and Venlafaxine    Review of Systems  Updated Vital Signs BP 127/86   Pulse 76   Temp  98.4 F (36.9 C)   Resp 18   Ht 5' 6 (1.676 m)   Wt 99.8 kg   LMP 02/13/2015   SpO2 98%   BMI 35.51 kg/m   Physical Exam  (all labs ordered are listed, but only abnormal results are displayed) Labs Reviewed  CBC WITH DIFFERENTIAL/PLATELET - Abnormal; Notable for the following components:      Result Value   HCT 35.3 (*)    Eosinophils Absolute 0.6 (*)    Abs Immature Granulocytes 0.12 (*)    All other components within normal limits  COMPREHENSIVE METABOLIC PANEL WITH GFR  CBG MONITORING, ED    EKG: EKG Interpretation Date/Time:  Friday December 17 2023 18:04:27 EDT Ventricular Rate:  81 PR Interval:  78 QRS Duration:  74 QT Interval:  456 QTC Calculation: 533 R Axis:   32  Text Interpretation: Sinus rhythm Short PR interval Low voltage, precordial leads Prolonged QT interval Baseline wander in lead(s) V4 V5 Interpretation limited secondary to artifact Confirmed by Freddi Hamilton [phone]) on 12/17/2023 6:56:59 PM  Radiology: EP STUDY Result Date: 12/16/2023 CONCLUSIONS: 1. Successful redo PVI 2. Successful redo ablation/isolation of the posterior wall 3. Intracardiac echo reveals trivial pericardial effusion, normal LA architecture 4. No early apparent complications. 5. Colchicine  0.6mg  PO BID x 5 days 6. Protonix  40mg  PO daily x 45 days     Procedures   Medications Ordered in the ED  lactated ringers  bolus 500 mL (500 mLs Intravenous New Bag/Given 12/17/23 1928)                                    Medical Decision Making Amount and/or Complexity of Data Reviewed External Data Reviewed: notes. Labs: ordered.    Details: No normal WBC Radiology: ordered and independent interpretation performed.    Details: No head bleed ECG/medicine tests: ordered and independent interpretation performed.    Details: So this rhythm  Risk Prescription drug management.   Patient presents with transient diplopia.  Currently feels like her vision is better.  No other  neurosymptoms.  Had some transient neck discomfort so a CTA was obtained and does not show any obvious arterial occlusion or dissection.  No head bleed.  Discussed with Dr. Voncile, who recommends an MRI.  If this does not show stroke then she could be discharged as she is already on Eliquis .  Care transferred to Dr. Midge.     Final diagnoses:  None    ED Discharge Orders     None          Freddi Hamilton, MD 12/17/23 [phone]

## 2023-12-17 NOTE — ED Notes (Signed)
 Patient returned from CT

## 2023-12-18 ENCOUNTER — Emergency Department (HOSPITAL_COMMUNITY)

## 2023-12-18 DIAGNOSIS — R29818 Other symptoms and signs involving the nervous system: Secondary | ICD-10-CM | POA: Diagnosis not present

## 2023-12-18 MED ORDER — APIXABAN 5 MG PO TABS
5.0000 mg | ORAL_TABLET | Freq: Once | ORAL | Status: AC
  Administered 2023-12-18: 5 mg via ORAL
  Filled 2023-12-18: qty 1

## 2023-12-18 NOTE — ED Notes (Signed)
 Patient returned from MRI.

## 2023-12-18 NOTE — ED Provider Notes (Signed)
 I assumed care from Dr. Glendia Breeding to follow-up on imaging.  Please see his note for initial workup. Patient had transient diplopia after recent cardiac ablation. She is now back to baseline.  Patient has been walking in the ER in no distress.  MRI negative.  Patient feels comfortable for discharge home.  Discussed need for close follow-up with ophthalmology, given outpatient information.   [name] Golas, MD 12/18/23 0201

## 2023-12-18 NOTE — ED Notes (Signed)
 Patient transported to MRI

## 2023-12-18 NOTE — ED Notes (Signed)
 Patient given water and sandwich bag per EDP.

## 2023-12-22 NOTE — Progress Notes (Addendum)
 [facility] - Clinic Note  12/27/2023   CHIEF COMPLAINT Patient presents for Retina Evaluation  HISTORY OF PRESENT ILLNESS: [name] Ross is a 58 y.o. female who presents to the clinic today for:  HPI     Retina Evaluation   In right eye.  This started 1 week ago.  Duration of 1 week.  I, the attending physician,  performed the HPI with the patient and updated documentation appropriately.        Comments   Retina eval per Dr Octavia for edema pt is reporting that over a week ago she had ablation on July 24th the next day patient started having double vision that last few seconds she went to ED and ask her to cover one eye but it had already went away she states it was like looking cross eyed she had a spot in her right eye that stationary she denies any flashes of light pt states she will not be going back to Dr Octavia office       Last edited by Valdemar Rogue, MD on 12/27/2023  9:50 AM.    Pt states she experienced double VA last week then was seen @ the hospital for stroke symptoms after her second ablation. Diagnosed w/ afib about 6 years ago. Pt taking Ozempic , reports borderline diabetes. Takes med for weight loss.   Referring physician: Octavia Charlie Hamilton, MD [address] [place],  [place] [address]  HISTORICAL INFORMATION:  Selected notes from the MEDICAL RECORD NUMBER Referred by Dr. Octavia for macular edema OD LEE:  Ocular Hx- PMH-   CURRENT MEDICATIONS: No current outpatient medications on file. (Ophthalmic Drugs)   No current facility-administered medications for this visit. (Ophthalmic Drugs)   Current Outpatient Medications (Other)  Medication Sig   acetaminophen  (TYLENOL ) 500 MG tablet Take 1,000 mg by mouth every 6 (six) hours as needed for mild pain.   albuterol  (VENTOLIN  HFA) 108 (90 Base) MCG/ACT inhaler Inhale 2 puffs into the lungs every 6 (six) hours as needed for wheezing or shortness of breath.   amiodarone  (PACERONE ) 200 MG tablet  Take 1 tablet (200 mg total) by mouth daily.   apixaban  (ELIQUIS ) 5 MG TABS tablet Take 1 tablet (5 mg total) by mouth 2 (two) times daily.   Ascorbic Acid  (VITAMIN C ) 1000 MG tablet Take 1,000 mg by mouth daily.   buPROPion  (WELLBUTRIN  XL) 300 MG 24 hr tablet Take 300 mg by mouth every morning.    butalbital -acetaminophen -caffeine  (FIORICET ) 50-325-40 MG tablet Take 1 tablet by mouth 2 (two) times daily as needed for headache.   CALCIUM  PO Take 1,200 tablets by mouth daily.   carvedilol  (COREG ) 25 MG tablet TAKE TWO TABLETS BY MOUTH TWICE DAILY   Cholecalciferol  (VITAMIN D -3 PO) Take 2,000 Units by mouth daily.   dicyclomine  (BENTYL ) 10 MG capsule Take one tablet by mouth once to twice daily as needed   escitalopram  (LEXAPRO ) 20 MG tablet Take 20 mg by mouth every morning.    estradiol  (ESTRACE ) 2 MG tablet Take 2 mg by mouth daily.   fenofibrate  micronized (LOFIBRA) 200 MG capsule Take 200 mg by mouth daily before breakfast.    fexofenadine (ALLEGRA) 180 MG tablet Take 180 mg by mouth daily.   fluticasone  (FLONASE ) 50 MCG/ACT nasal spray Place 1 spray into both nostrils daily.   hydrOXYzine  (VISTARIL ) 25 MG capsule Take 25 mg by mouth at bedtime.   ibuprofen (ADVIL) 200 MG tablet Take 600-800 mg by  mouth every 6 (six) hours as needed for moderate pain.   losartan  (COZAAR ) 50 MG tablet Take 50 mg by mouth daily.   lubiprostone  (AMITIZA ) 24 MCG capsule Take 1 capsule (24 mcg total) by mouth 2 (two) times daily with a meal. Please schedule a yearly follow up appointment for further refills. Thank you.   magnesium  oxide (MAG-OX) 400 (240 Mg) MG tablet Take 400 mg by mouth daily.   meclizine  (ANTIVERT ) 25 MG tablet Take 1 tablet (25 mg total) by mouth 3 (three) times daily as needed for dizziness.   mometasone  (ELOCON ) 0.1 % ointment Apply 1 application. topically 2 (two) times daily as needed (rash).   omega-3 acid ethyl esters (LOVAZA ) 1 g capsule Take 1 capsule (1 g total) by mouth 2 (two) times  daily.   omeprazole  (PRILOSEC) 40 MG capsule Take 1 capsule (40 mg total) by mouth in the morning and at bedtime.   ondansetron  (ZOFRAN -ODT) 8 MG disintegrating tablet Take 8 mg by mouth every 8 (eight) hours as needed for nausea or vomiting.   pantoprazole  (PROTONIX ) 40 MG tablet Take 1 tablet (40 mg total) by mouth daily.   pramipexole  (MIRAPEX ) 0.75 MG tablet TAKE ONE TABLET BY MOUTH AT BEDTIME   colchicine  0.6 MG tablet Take 1 tablet (0.6 mg total) by mouth 2 (two) times daily for 5 days.   RABEprazole (ACIPHEX) 20 MG tablet Take 20 mg by mouth every morning.   Semaglutide , 2 MG/DOSE, (OZEMPIC , 2 MG/DOSE,) 8 MG/3ML SOPN Inject 2 mg into the skin once a week.   tacrolimus (PROTOPIC) 0.1 % ointment Apply 1 application. topically 2 (two) times daily as needed (rash).   triamcinolone ointment (KENALOG) 0.1 % Apply 1 Application topically 2 (two) times daily as needed.   triamterene -hydrochlorothiazide  (MAXZIDE -25) 37.5-25 MG tablet Take 0.5 tablets by mouth daily.   valACYclovir (VALTREX) 1000 MG tablet Take 1,000 mg by mouth 2 (two) times daily as needed (cold sores).   Vitamin D , Ergocalciferol , (DRISDOL ) 1.25 MG (50000 UNIT) CAPS capsule Take 1 capsule (50,000 Units total) by mouth every 7 (seven) days.   zolpidem  (AMBIEN ) 10 MG tablet Take 10 mg by mouth at bedtime as needed for sleep.   No current facility-administered medications for this visit. (Other)   REVIEW OF SYSTEMS: ROS   Positive for: Cardiovascular, Eyes Last edited by Resa Delon ORN, COT on 12/27/2023  9:05 AM.     ALLERGIES Allergies  Allergen Reactions   Metformin Hcl     Other reaction(s): bloating     Venlafaxine     Sleepy   PAST MEDICAL HISTORY Past Medical History:  Diagnosis Date   Allergy    Anal skin tag    Anemia    Anxiety    Atrial fibrillation (HCC)    Atrial fibrillation (HCC)    Constipation    Depression    Diabetes mellitus without complication (HCC)    pre DM   Diverticulosis     Diverticulosis, sigmoid    Dyslipidemia    GERD (gastroesophageal reflux disease)    Heartburn    Hemorrhoids    internal and external   History of diverticulitis    10/ 2016   History of galactorrhea    History of stress test    a. Pt reports h/o stress test and echo by cardiologist in [place] (no records in Epic).  Both reportedly nl.   Hyperlipidemia    Hypertension    IBS (irritable bowel syndrome)    Joint pain  Morbid obesity (HCC)    Palpitations    PONV (postoperative nausea and vomiting)    severe   Prediabetes    Rash    elbows and behind knees   Restless leg syndrome    Sleep apnea    wears c-pap   Vitamin D  deficiency    Wears glasses    Past Surgical History:  Procedure Laterality Date   ATRIAL FIBRILLATION ABLATION N/A 07/31/2021   Procedure: ATRIAL FIBRILLATION ABLATION;  Surgeon: Cindie Ole DASEN, MD;  Location: [facility];  Service: Cardiovascular;  Laterality: N/A;   ATRIAL FIBRILLATION ABLATION N/A 12/16/2023   Procedure: ATRIAL FIBRILLATION ABLATION;  Surgeon: Cindie Ole DASEN, MD;  Location: [facility];  Service: Cardiovascular;  Laterality: N/A;   BAND HEMORRHOIDECTOMY  2012   Dr Luis   BREAST BIOPSY Right 08/25/2022   US  RT BREAST BX W LOC DEV 1ST LESION IMG BX SPEC US  GUIDE 08/25/2022 [facility]   CARDIOVERSION N/A 04/08/2021   Procedure: CARDIOVERSION;  Surgeon: Mady Bruckner, MD;  Location: [facility];  Service: Cardiovascular;  Laterality: N/A;   CESAREAN SECTION  1994   COLONOSCOPY  06/18/2011   CYSTO/  BILATERAL PYELOGRAM RETROGRADES/  TRANSOBTURATOR SLING  03/08/2006   EXCISION OF SKIN TAG N/A 02/13/2016   Procedure: ANAL SKIN TAG EXCISION;  Surgeon: Bernarda Ned, MD;  Location: [facility];  Service: General;  Laterality: N/A;   LAPAROSCOPIC CHOLECYSTECTOMY  05/22/2001   SHOULDER SURGERY     FAMILY HISTORY Family History  Problem Relation Age of Onset   Cancer Mother    Colon cancer Mother     Diabetes Mother    Hypertension Mother    Atrial fibrillation Mother    Obesity Mother    Hyperlipidemia Father    Diabetes Father        committed suicide   Skin cancer Father    Suicidality Father    Hypertension Father    Diabetes Sister    Diabetes Sister    Breast cancer Other        materanl great aunt   Sleep apnea Neg Hx    Esophageal cancer Neg Hx    Rectal cancer Neg Hx    Stomach cancer Neg Hx    Restless legs syndrome Neg Hx    SOCIAL HISTORY Social History   Tobacco Use   Smoking status: Never   Smokeless tobacco: Never   Tobacco comments:    Never smoke 08/26/21  Vaping Use   Vaping status: Never Used  Substance Use Topics   Alcohol use: No    Alcohol/week: 0.0 standard drinks of alcohol   Drug use: No       OPHTHALMIC EXAM:  Base Eye Exam     Visual Acuity (Snellen - Linear)       Right Left   Dist cc 20/20 -1 20/20    Correction: Glasses         Tonometry (Tonopen, 9:10 AM)       Right Left   Pressure 13 14         Pupils       Pupils Dark Light Shape React APD   Right PERRL 3 2 Round Brisk None   Left PERRL 3 2 Round Brisk None         Visual Fields       Left Right    Full Full         Extraocular Movement  Right Left    Full, Ortho Full, Ortho         Neuro/Psych     Oriented x3: Yes   Mood/Affect: Normal         Dilation     Both eyes: 2.5% Phenylephrine  @ 9:10 AM           Slit Lamp and Fundus Exam     Slit Lamp Exam       Right Left   Lids/Lashes Dermatochalasis Dermatochalasis   Conjunctiva/Sclera White and quiet White and quiet   Cornea 1+ PEE, tear film debris 1+ PEE, tear film debris   Anterior Chamber Deep and quiet Deep and quiet   Iris Round and dilated Round and dilated   Lens 2+ Cortical cataract, 2+ Nuclear sclerosis 2+ Cortical cataract, 2+ Nuclear sclerosis   Anterior Vitreous mild syneresis mild syneresis         Fundus Exam       Right Left   Disc Pink and  Sharp Pink and Sharp, mild PPP   C/D Ratio 0.2 0.2   Macula Flat, good foveal reflex, no heme or edema Flat, good foveal reflex, no heme or edema   Vessels Attenuated, mild tortuosity, mild AV crossing changes. Attenuated, mild tortuosity.   Periphery Attached, no heme Attached, mild pigmented cystoid degeneration, pavingstone degeneration peripherally           IMAGING AND PROCEDURES  Imaging and Procedures for 12/27/2023  OCT, Retina - OU - Both Eyes       Right Eye Quality was good. Central Foveal Thickness: 256. Progression has no prior data. 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   Left Eye Quality was good. Central Foveal Thickness: 256. Progression has no prior data. Findings include normal foveal contour, no IRF, no SRF, vitreomacular adhesion .   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 CME - cystoid macular edema. PED - pigment epithelial detachment. IRF - intraretinal fluid. SRF - subretinal fluid. EZ - ellipsoid zone. ERM - epiretinal membrane. ORA - outer retinal atrophy. ORT - outer retinal tubulation. SRHM - subretinal hyper-reflective material. IRHM - intraretinal hyper-reflective material           ASSESSMENT/PLAN:   ICD-10-CM   1. 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 Diabetes mellitus type 2 without retinopathy (HCC)  E11.9 OCT, Retina - OU - Both Eyes    3. Long-term (current) use of injectable non-insulin  antidiabetic drugs  Z79.85     4. Essential hypertension  I10     5. Hypertensive retinopathy of both eyes  H35.033     6. Combined forms of age-related cataract of both eyes  H25.813      1. Paracentral acute middle maculopathy OD  - OCT shows mild Focal IRHM (?PAMM)  superior macula  - incidental finding on retinal exam by Dr. Octavia during consultation for diplopia  - pt reports focal scotoma / blurred spot in inferior paracentral visual field, corresponding to focal IRHM  - discussed findings, diagnosis, prognosis  - no retinal or ophthalmic interventions indicated or recommended   - recommend optimization of cardiovascular risk factors for ischemia -- already being followed closely by Cardiology due to history of Afib and recent ablation  2,3. Diabetes mellitus, type  2 without retinopathy  - A1C 5.8 (04.30.25) - The incidence, risk factors for progression, natural history and treatment options for diabetic retinopathy  were discussed with patient.   - The need for close monitoring of blood glucose, blood pressure, and serum lipids, avoiding cigarette or any type of tobacco, and the need for long term follow up was also discussed with patient. - f/u in 1 year, sooner prn   4,5. Hypertensive retinopathy OU - discussed importance of tight BP control - monitor   6. Mixed Cataracts OU - The symptoms of cataract, surgical options, and treatments and risks were discussed with patient. - discussed diagnosis and progression - monitor  Ophthalmic Meds Ordered this visit:  No orders of the defined types were placed in this encounter.    Return in about 6 weeks (around 02/07/2024) for PAMM OD, DFE, OCT.  There are no Patient Instructions on file for this visit.  Explained the diagnoses, plan, and follow up with the patient and they expressed understanding.  Patient expressed understanding of the importance of proper follow up care.   This document serves as a record of services personally performed by Redell JUDITHANN Hans, MD, PhD. It was created on their behalf by Avelina Pereyra, COA an ophthalmic technician. The creation of this record is the provider's dictation and/or activities during the visit.   Electronically signed by: Avelina GORMAN Pereyra, COT  12/27/23   5:37 PM    Redell JUDITHANN Hans, M.D., Ph.D. Diseases & Surgery of the Retina and Vitreous [facility] 12/27/2023  I have reviewed the above documentation for accuracy and completeness, and I agree with the above. Redell JUDITHANN Hans, M.D., Ph.D. 12/27/23 5:43 PM   Abbreviations: M myopia (nearsighted); A astigmatism; H hyperopia (farsighted); P presbyopia; Mrx spectacle prescription;  CTL contact lenses; OD right eye; OS left eye; OU both eyes  XT exotropia; ET esotropia; PEK punctate epithelial keratitis; PEE punctate epithelial erosions; DES dry eye syndrome; MGD meibomian gland dysfunction; ATs artificial tears; PFAT's preservative free artificial tears; NSC nuclear sclerotic cataract; PSC posterior subcapsular cataract; ERM epi-retinal membrane; PVD posterior vitreous detachment; RD retinal detachment; DM diabetes mellitus; DR diabetic retinopathy; NPDR non-proliferative diabetic retinopathy; PDR proliferative diabetic retinopathy; CSME clinically significant macular edema; DME diabetic macular edema; dbh dot blot hemorrhages; CWS cotton wool spot; POAG primary open angle glaucoma; C/D cup-to-disc ratio; HVF humphrey visual field; GVF goldmann visual field; OCT optical coherence tomography; IOP intraocular pressure; BRVO Branch retinal vein occlusion; CRVO central retinal vein occlusion; CRAO central retinal artery occlusion; BRAO branch retinal artery occlusion; RT retinal tear; SB scleral buckle; PPV pars plana vitrectomy; VH Vitreous hemorrhage; PRP panretinal laser photocoagulation; IVK intravitreal kenalog; VMT vitreomacular traction; MH Macular hole;  NVD neovascularization of the disc; NVE neovascularization elsewhere; AREDS age related eye disease study; ARMD age related macular degeneration; POAG primary open angle glaucoma; EBMD epithelial/anterior basement membrane dystrophy; ACIOL anterior chamber intraocular lens; IOL intraocular lens; PCIOL posterior chamber intraocular  lens; Phaco/IOL phacoemulsification with intraocular lens placement; PRK photorefractive keratectomy; LASIK laser assisted in situ keratomileusis; HTN hypertension; DM diabetes mellitus; COPD chronic obstructive pulmonary disease

## 2023-12-27 ENCOUNTER — Encounter (INDEPENDENT_AMBULATORY_CARE_PROVIDER_SITE_OTHER): Payer: Self-pay | Admitting: Ophthalmology

## 2023-12-27 ENCOUNTER — Ambulatory Visit (INDEPENDENT_AMBULATORY_CARE_PROVIDER_SITE_OTHER): Admitting: Ophthalmology

## 2023-12-27 DIAGNOSIS — E119 Type 2 diabetes mellitus without complications: Secondary | ICD-10-CM

## 2023-12-27 DIAGNOSIS — H25813 Combined forms of age-related cataract, bilateral: Secondary | ICD-10-CM

## 2023-12-27 DIAGNOSIS — H3530 Unspecified macular degeneration: Secondary | ICD-10-CM

## 2023-12-27 DIAGNOSIS — H35033 Hypertensive retinopathy, bilateral: Secondary | ICD-10-CM | POA: Diagnosis not present

## 2023-12-27 DIAGNOSIS — I10 Essential (primary) hypertension: Secondary | ICD-10-CM

## 2023-12-27 DIAGNOSIS — H3581 Retinal edema: Secondary | ICD-10-CM

## 2023-12-27 DIAGNOSIS — Z7985 Long-term (current) use of injectable non-insulin antidiabetic drugs: Secondary | ICD-10-CM | POA: Diagnosis not present

## 2023-12-28 ENCOUNTER — Ambulatory Visit (INDEPENDENT_AMBULATORY_CARE_PROVIDER_SITE_OTHER): Admitting: Family Medicine

## 2023-12-28 ENCOUNTER — Encounter (INDEPENDENT_AMBULATORY_CARE_PROVIDER_SITE_OTHER): Payer: Self-pay

## 2023-12-29 ENCOUNTER — Encounter: Payer: Self-pay | Admitting: Emergency Medicine

## 2024-01-18 ENCOUNTER — Ambulatory Visit: Attending: Cardiology | Admitting: Cardiology

## 2024-01-18 NOTE — Progress Notes (Deleted)
 f

## 2024-01-26 NOTE — Progress Notes (Shared)
 [facility] - Clinic Note  02/07/2024   CHIEF COMPLAINT Patient presents for No chief complaint on file.  HISTORY OF PRESENT ILLNESS: [name] Ross is a 58 y.o. female who presents to the clinic today for:   Pt states she experienced double VA last week then was seen @ the hospital for stroke symptoms after her second ablation. Diagnosed w/ afib about 6 years ago. Pt taking Ozempic , reports borderline diabetes. Takes med for weight loss.   Referring physician: Yolande Toribio MATSU, MD [address] [place],  [place] [address]  HISTORICAL INFORMATION:  Selected notes from the MEDICAL RECORD NUMBER Referred by Dr. Octavia for macular edema OD LEE:  Ocular Hx- PMH-   CURRENT MEDICATIONS: No current outpatient medications on file. (Ophthalmic Drugs)   No current facility-administered medications for this visit. (Ophthalmic Drugs)   Current Outpatient Medications (Other)  Medication Sig   acetaminophen  (TYLENOL ) 500 MG tablet Take 1,000 mg by mouth every 6 (six) hours as needed for mild pain.   albuterol  (VENTOLIN  HFA) 108 (90 Base) MCG/ACT inhaler Inhale 2 puffs into the lungs every 6 (six) hours as needed for wheezing or shortness of breath.   amiodarone  (PACERONE ) 200 MG tablet Take 1 tablet (200 mg total) by mouth daily.   apixaban  (ELIQUIS ) 5 MG TABS tablet Take 1 tablet (5 mg total) by mouth 2 (two) times daily.   Ascorbic Acid  (VITAMIN C ) 1000 MG tablet Take 1,000 mg by mouth daily.   buPROPion  (WELLBUTRIN  XL) 300 MG 24 hr tablet Take 300 mg by mouth every morning.    butalbital -acetaminophen -caffeine  (FIORICET ) 50-325-40 MG tablet Take 1 tablet by mouth 2 (two) times daily as needed for headache.   CALCIUM  PO Take 1,200 tablets by mouth daily.   carvedilol  (COREG ) 25 MG tablet TAKE TWO TABLETS BY MOUTH TWICE DAILY   Cholecalciferol  (VITAMIN D -3 PO) Take 2,000 Units by mouth daily.   colchicine  0.6 MG tablet Take 1 tablet (0.6 mg total) by mouth 2 (two) times  daily for 5 days.   dicyclomine  (BENTYL ) 10 MG capsule Take one tablet by mouth once to twice daily as needed   escitalopram  (LEXAPRO ) 20 MG tablet Take 20 mg by mouth every morning.    estradiol  (ESTRACE ) 2 MG tablet Take 2 mg by mouth daily.   fenofibrate  micronized (LOFIBRA) 200 MG capsule Take 200 mg by mouth daily before breakfast.    fexofenadine (ALLEGRA) 180 MG tablet Take 180 mg by mouth daily.   fluticasone  (FLONASE ) 50 MCG/ACT nasal spray Place 1 spray into both nostrils daily.   hydrOXYzine  (VISTARIL ) 25 MG capsule Take 25 mg by mouth at bedtime.   ibuprofen (ADVIL) 200 MG tablet Take 600-800 mg by mouth every 6 (six) hours as needed for moderate pain.   losartan  (COZAAR ) 50 MG tablet Take 50 mg by mouth daily.   lubiprostone  (AMITIZA ) 24 MCG capsule Take 1 capsule (24 mcg total) by mouth 2 (two) times daily with a meal. Please schedule a yearly follow up appointment for further refills. Thank you.   magnesium  oxide (MAG-OX) 400 (240 Mg) MG tablet Take 400 mg by mouth daily.   meclizine  (ANTIVERT ) 25 MG tablet Take 1 tablet (25 mg total) by mouth 3 (three) times daily as needed for dizziness.   mometasone  (ELOCON ) 0.1 % ointment Apply 1 application. topically 2 (two) times daily as needed (rash).   omega-3 acid ethyl esters (LOVAZA ) 1 g capsule Take 1 capsule (1 g total) by mouth 2 (  two) times daily.   omeprazole  (PRILOSEC) 40 MG capsule Take 1 capsule (40 mg total) by mouth in the morning and at bedtime.   ondansetron  (ZOFRAN -ODT) 8 MG disintegrating tablet Take 8 mg by mouth every 8 (eight) hours as needed for nausea or vomiting.   pantoprazole  (PROTONIX ) 40 MG tablet Take 1 tablet (40 mg total) by mouth daily.   pramipexole  (MIRAPEX ) 0.75 MG tablet TAKE ONE TABLET BY MOUTH AT BEDTIME   RABEprazole (ACIPHEX) 20 MG tablet Take 20 mg by mouth every morning.   Semaglutide , 2 MG/DOSE, (OZEMPIC , 2 MG/DOSE,) 8 MG/3ML SOPN Inject 2 mg into the skin once a week.   tacrolimus (PROTOPIC) 0.1  % ointment Apply 1 application. topically 2 (two) times daily as needed (rash).   triamcinolone ointment (KENALOG) 0.1 % Apply 1 Application topically 2 (two) times daily as needed.   triamterene -hydrochlorothiazide  (MAXZIDE -25) 37.5-25 MG tablet Take 0.5 tablets by mouth daily.   valACYclovir (VALTREX) 1000 MG tablet Take 1,000 mg by mouth 2 (two) times daily as needed (cold sores).   Vitamin D , Ergocalciferol , (DRISDOL ) 1.25 MG (50000 UNIT) CAPS capsule Take 1 capsule (50,000 Units total) by mouth every 7 (seven) days.   zolpidem  (AMBIEN ) 10 MG tablet Take 10 mg by mouth at bedtime as needed for sleep.   No current facility-administered medications for this visit. (Other)   REVIEW OF SYSTEMS:   ALLERGIES Allergies  Allergen Reactions   Metformin Hcl     Other reaction(s): bloating     Venlafaxine     Sleepy   PAST MEDICAL HISTORY Past Medical History:  Diagnosis Date   Allergy    Anal skin tag    Anemia    Anxiety    Atrial fibrillation (HCC)    Atrial fibrillation (HCC)    Constipation    Depression    Diabetes mellitus without complication (HCC)    pre DM   Diverticulosis    Diverticulosis, sigmoid    Dyslipidemia    GERD (gastroesophageal reflux disease)    Heartburn    Hemorrhoids    internal and external   History of diverticulitis    10/ 2016   History of galactorrhea    History of stress test    a. Pt reports h/o stress test and echo by cardiologist in [place] (no records in Epic).  Both reportedly nl.   Hyperlipidemia    Hypertension    IBS (irritable bowel syndrome)    Joint pain    Morbid obesity (HCC)    Palpitations    PONV (postoperative nausea and vomiting)    severe   Prediabetes    Rash    elbows and behind knees   Restless leg syndrome    Sleep apnea    wears c-pap   Vitamin D  deficiency    Wears glasses    Past Surgical History:  Procedure Laterality Date   ATRIAL FIBRILLATION ABLATION N/A 07/31/2021   Procedure: ATRIAL FIBRILLATION  ABLATION;  Surgeon: Cindie Ole DASEN, MD;  Location: [facility];  Service: Cardiovascular;  Laterality: N/A;   ATRIAL FIBRILLATION ABLATION N/A 12/16/2023   Procedure: ATRIAL FIBRILLATION ABLATION;  Surgeon: Cindie Ole DASEN, MD;  Location: [facility];  Service: Cardiovascular;  Laterality: N/A;   BAND HEMORRHOIDECTOMY  2012   Dr Luis   BREAST BIOPSY Right 08/25/2022   US  RT BREAST BX W LOC DEV 1ST LESION IMG BX SPEC US  GUIDE 08/25/2022 [facility]   CARDIOVERSION N/A 04/08/2021   Procedure:  CARDIOVERSION;  Surgeon: Mady Bruckner, MD;  Location: [facility];  Service: Cardiovascular;  Laterality: N/A;   CESAREAN SECTION  1994   COLONOSCOPY  06/18/2011   CYSTO/  BILATERAL PYELOGRAM RETROGRADES/  TRANSOBTURATOR SLING  03/08/2006   EXCISION OF SKIN TAG N/A 02/13/2016   Procedure: ANAL SKIN TAG EXCISION;  Surgeon: Bernarda Ned, MD;  Location: [facility];  Service: General;  Laterality: N/A;   LAPAROSCOPIC CHOLECYSTECTOMY  05/22/2001   SHOULDER SURGERY     FAMILY HISTORY Family History  Problem Relation Age of Onset   Cancer Mother    Colon cancer Mother    Diabetes Mother    Hypertension Mother    Atrial fibrillation Mother    Obesity Mother    Hyperlipidemia Father    Diabetes Father        committed suicide   Skin cancer Father    Suicidality Father    Hypertension Father    Diabetes Sister    Diabetes Sister    Breast cancer Other        materanl great aunt   Sleep apnea Neg Hx    Esophageal cancer Neg Hx    Rectal cancer Neg Hx    Stomach cancer Neg Hx    Restless legs syndrome Neg Hx    SOCIAL HISTORY Social History   Tobacco Use   Smoking status: Never   Smokeless tobacco: Never   Tobacco comments:    Never smoke 08/26/21  Vaping Use   Vaping status: Never Used  Substance Use Topics   Alcohol use: No    Alcohol/week: 0.0 standard drinks of alcohol   Drug use: No       OPHTHALMIC EXAM:  Not recorded    IMAGING  AND PROCEDURES  Imaging and Procedures for 02/07/2024         ASSESSMENT/PLAN:   ICD-10-CM   1. 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 Diabetes mellitus type 2 without retinopathy (HCC)  E11.9     3. Long-term (current) use of injectable non-insulin  antidiabetic drugs  Z79.85     4. Essential hypertension  I10     5. Hypertensive retinopathy of both eyes  H35.033     6. Combined forms of age-related cataract of both eyes  H25.813       1. Paracentral acute middle maculopathy OD  - OCT shows mild Focal IRHM (?PAMM) superior macula  - incidental finding on retinal exam by Dr. Octavia during consultation for diplopia  - pt reports focal scotoma / blurred spot in inferior paracentral visual field, corresponding to focal IRHM  - discussed findings, diagnosis, prognosis  - no retinal or ophthalmic interventions indicated or recommended   - recommend optimization of cardiovascular risk factors for ischemia -- already being followed closely by Cardiology due to history of Afib and recent ablation  2,3. Diabetes mellitus, type 2 without retinopathy  - A1C 5.8 (04.30.25) - The incidence, risk factors for progression, natural history and treatment options for diabetic retinopathy  were discussed with patient.   - The need for close monitoring of blood glucose, blood pressure, and serum lipids, avoiding cigarette or any type of tobacco, and the need for long term follow up was also discussed with patient. - f/u in 1 year, sooner prn   4,5. Hypertensive retinopathy OU - discussed importance of tight BP control - monitor   6. Mixed Cataracts OU - The symptoms of cataract, surgical options, and treatments and risks were discussed  with patient. - discussed diagnosis and progression - monitor  Ophthalmic Meds Ordered this visit:  No orders of the defined types were placed in this encounter.    No follow-ups on file.  There are no Patient Instructions on file for this  visit.  Explained the diagnoses, plan, and follow up with the patient and they expressed understanding.  Patient expressed understanding of the importance of proper follow up care.   This document serves as a record of services personally performed by Redell JUDITHANN Hans, MD, PhD. It was created on their behalf by Avelina Pereyra, COA an ophthalmic technician. The creation of this record is the provider's dictation and/or activities during the visit.   Electronically signed by: Avelina GORMAN Pereyra, COT  01/26/24  10:44 AM    Redell JUDITHANN Hans, M.D., Ph.D. Diseases & Surgery of the Retina and Vitreous [facility] 02/07/2024   Abbreviations: M myopia (nearsighted); A astigmatism; H hyperopia (farsighted); P presbyopia; Mrx spectacle prescription;  CTL contact lenses; OD right eye; OS left eye; OU both eyes  XT exotropia; ET esotropia; PEK punctate epithelial keratitis; PEE punctate epithelial erosions; DES dry eye syndrome; MGD meibomian gland dysfunction; ATs artificial tears; PFAT's preservative free artificial tears; NSC nuclear sclerotic cataract; PSC posterior subcapsular cataract; ERM epi-retinal membrane; PVD posterior vitreous detachment; RD retinal detachment; DM diabetes mellitus; DR diabetic retinopathy; NPDR non-proliferative diabetic retinopathy; PDR proliferative diabetic retinopathy; CSME clinically significant macular edema; DME diabetic macular edema; dbh dot blot hemorrhages; CWS cotton wool spot; POAG primary open angle glaucoma; C/D cup-to-disc ratio; HVF humphrey visual field; GVF goldmann visual field; OCT optical coherence tomography; IOP intraocular pressure; BRVO Branch retinal vein occlusion; CRVO central retinal vein occlusion; CRAO central retinal artery occlusion; BRAO branch retinal artery occlusion; RT retinal tear; SB scleral buckle; PPV pars plana vitrectomy; VH Vitreous hemorrhage; PRP panretinal laser photocoagulation; IVK intravitreal kenalog; VMT  vitreomacular traction; MH Macular hole;  NVD neovascularization of the disc; NVE neovascularization elsewhere; AREDS age related eye disease study; ARMD age related macular degeneration; POAG primary open angle glaucoma; EBMD epithelial/anterior basement membrane dystrophy; ACIOL anterior chamber intraocular lens; IOL intraocular lens; PCIOL posterior chamber intraocular lens; Phaco/IOL phacoemulsification with intraocular lens placement; PRK photorefractive keratectomy; LASIK laser assisted in situ keratomileusis; HTN hypertension; DM diabetes mellitus; COPD chronic obstructive pulmonary disease

## 2024-02-07 ENCOUNTER — Encounter (INDEPENDENT_AMBULATORY_CARE_PROVIDER_SITE_OTHER): Admitting: Ophthalmology

## 2024-02-07 DIAGNOSIS — H25813 Combined forms of age-related cataract, bilateral: Secondary | ICD-10-CM

## 2024-02-07 DIAGNOSIS — E119 Type 2 diabetes mellitus without complications: Secondary | ICD-10-CM

## 2024-02-07 DIAGNOSIS — H3530 Unspecified macular degeneration: Secondary | ICD-10-CM

## 2024-02-07 DIAGNOSIS — I10 Essential (primary) hypertension: Secondary | ICD-10-CM

## 2024-02-07 DIAGNOSIS — H35033 Hypertensive retinopathy, bilateral: Secondary | ICD-10-CM

## 2024-02-07 DIAGNOSIS — Z7985 Long-term (current) use of injectable non-insulin antidiabetic drugs: Secondary | ICD-10-CM

## 2024-02-17 ENCOUNTER — Other Ambulatory Visit (INDEPENDENT_AMBULATORY_CARE_PROVIDER_SITE_OTHER): Payer: Self-pay | Admitting: Family Medicine

## 2024-02-17 DIAGNOSIS — E559 Vitamin D deficiency, unspecified: Secondary | ICD-10-CM

## 2024-02-19 ENCOUNTER — Other Ambulatory Visit: Payer: Self-pay | Admitting: Cardiology

## 2024-02-19 DIAGNOSIS — I480 Paroxysmal atrial fibrillation: Secondary | ICD-10-CM

## 2024-02-21 ENCOUNTER — Encounter: Payer: Self-pay | Admitting: *Deleted

## 2024-02-21 NOTE — Progress Notes (Signed)
 [facility] - Clinic Note  02/28/2024   CHIEF COMPLAINT Patient presents for Retina Follow Up  HISTORY OF PRESENT ILLNESS: [name] Ross is a 58 y.o. female who presents to the clinic today for:  HPI     Retina Follow Up   Diagnosis: Paracentral acute middle maculopathy.  In right eye.  This started 2 months ago.  Duration of 2 months.        Comments   Pt states the spot in her right eye has become almost a full circle. Pt denies FOL/pain. Pt states vision has been about the same.      Last edited by Elnor Avelina RAMAN, COT on 02/28/2024  1:32 PM.     Patient states she is stilling a spot in the right eye.  Referring physician: Yolande Toribio MATSU, MD [address] [place],  [place] [address]  HISTORICAL INFORMATION:  Selected notes from the MEDICAL RECORD NUMBER Referred by Dr. Octavia for macular edema OD LEE:  Ocular Hx- PMH-   CURRENT MEDICATIONS: No current outpatient medications on file. (Ophthalmic Drugs)   No current facility-administered medications for this visit. (Ophthalmic Drugs)   Current Outpatient Medications (Other)  Medication Sig   acetaminophen  (TYLENOL ) 500 MG tablet Take 1,000 mg by mouth every 6 (six) hours as needed for mild pain.   albuterol  (VENTOLIN  HFA) 108 (90 Base) MCG/ACT inhaler Inhale 2 puffs into the lungs every 6 (six) hours as needed for wheezing or shortness of breath.   amiodarone  (PACERONE ) 200 MG tablet Take 1 tablet (200 mg total) by mouth daily.   Ascorbic Acid  (VITAMIN C ) 1000 MG tablet Take 1,000 mg by mouth daily.   buPROPion  (WELLBUTRIN  XL) 300 MG 24 hr tablet Take 300 mg by mouth every morning.    butalbital -acetaminophen -caffeine  (FIORICET ) 50-325-40 MG tablet Take 1 tablet by mouth 2 (two) times daily as needed for headache.   CALCIUM  PO Take 1,200 tablets by mouth daily.   carvedilol  (COREG ) 25 MG tablet TAKE TWO TABLETS BY MOUTH TWICE DAILY   Cholecalciferol  (VITAMIN D -3 PO) Take 2,000 Units by mouth  daily.   colchicine  0.6 MG tablet Take 1 tablet (0.6 mg total) by mouth 2 (two) times daily for 5 days.   dicyclomine  (BENTYL ) 10 MG capsule Take one tablet by mouth once to twice daily as needed   ELIQUIS  5 MG TABS tablet TAKE ONE TABLET BY MOUTH TWICE DAILY   escitalopram  (LEXAPRO ) 20 MG tablet Take 20 mg by mouth every morning.    estradiol  (ESTRACE ) 2 MG tablet Take 2 mg by mouth daily.   fenofibrate  micronized (LOFIBRA) 200 MG capsule Take 200 mg by mouth daily before breakfast.    fexofenadine (ALLEGRA) 180 MG tablet Take 180 mg by mouth daily.   fluticasone  (FLONASE ) 50 MCG/ACT nasal spray Place 1 spray into both nostrils daily.   hydrOXYzine  (VISTARIL ) 25 MG capsule Take 25 mg by mouth at bedtime.   ibuprofen (ADVIL) 200 MG tablet Take 600-800 mg by mouth every 6 (six) hours as needed for moderate pain.   losartan  (COZAAR ) 50 MG tablet Take 50 mg by mouth daily.   lubiprostone  (AMITIZA ) 24 MCG capsule Take 1 capsule (24 mcg total) by mouth 2 (two) times daily with a meal. Please schedule a yearly follow up appointment for further refills. Thank you.   magnesium  oxide (MAG-OX) 400 (240 Mg) MG tablet Take 400 mg by mouth daily.   meclizine  (ANTIVERT ) 25 MG tablet Take 1 tablet (25 mg  total) by mouth 3 (three) times daily as needed for dizziness.   mometasone  (ELOCON ) 0.1 % ointment Apply 1 application. topically 2 (two) times daily as needed (rash).   omega-3 acid ethyl esters (LOVAZA ) 1 g capsule Take 1 capsule (1 g total) by mouth 2 (two) times daily.   omeprazole  (PRILOSEC) 40 MG capsule Take 1 capsule (40 mg total) by mouth in the morning and at bedtime.   ondansetron  (ZOFRAN -ODT) 8 MG disintegrating tablet Take 8 mg by mouth every 8 (eight) hours as needed for nausea or vomiting.   pantoprazole  (PROTONIX ) 40 MG tablet Take 1 tablet (40 mg total) by mouth daily.   pramipexole  (MIRAPEX ) 0.75 MG tablet Take 1 tablet (0.75 mg total) by mouth at bedtime.   RABEprazole (ACIPHEX) 20 MG  tablet Take 20 mg by mouth every morning.   Semaglutide , 2 MG/DOSE, (OZEMPIC , 2 MG/DOSE,) 8 MG/3ML SOPN Inject 2 mg into the skin once a week.   tacrolimus (PROTOPIC) 0.1 % ointment Apply 1 application. topically 2 (two) times daily as needed (rash).   triamcinolone ointment (KENALOG) 0.1 % Apply 1 Application topically 2 (two) times daily as needed.   triamterene -hydrochlorothiazide  (MAXZIDE -25) 37.5-25 MG tablet Take 0.5 tablets by mouth daily.   valACYclovir (VALTREX) 1000 MG tablet Take 1,000 mg by mouth 2 (two) times daily as needed (cold sores).   Vitamin D , Ergocalciferol , (DRISDOL ) 1.25 MG (50000 UNIT) CAPS capsule Take 1 capsule (50,000 Units total) by mouth every 7 (seven) days.   zolpidem  (AMBIEN ) 10 MG tablet Take 10 mg by mouth at bedtime as needed for sleep.   No current facility-administered medications for this visit. (Other)   REVIEW OF SYSTEMS: ROS   Positive for: Cardiovascular, Eyes Last edited by Elnor Avelina RAMAN, COT on 02/28/2024  1:27 PM.      ALLERGIES Allergies  Allergen Reactions   Metformin Hcl     Other reaction(s): bloating     Venlafaxine     Sleepy   PAST MEDICAL HISTORY Past Medical History:  Diagnosis Date   Allergy    Anal skin tag    Anemia    Anxiety    Atrial fibrillation (HCC)    Atrial fibrillation (HCC)    Constipation    Depression    Diabetes mellitus without complication (HCC)    pre DM   Diverticulosis    Diverticulosis, sigmoid    Dyslipidemia    GERD (gastroesophageal reflux disease)    Heartburn    Hemorrhoids    internal and external   History of diverticulitis    10/ 2016   History of galactorrhea    History of stress test    a. Pt reports h/o stress test and echo by cardiologist in [place] (no records in Epic).  Both reportedly nl.   Hyperlipidemia    Hypertension    IBS (irritable bowel syndrome)    Joint pain    Morbid obesity (HCC)    Palpitations    PONV (postoperative nausea and vomiting)    severe    Prediabetes    Rash    elbows and behind knees   Restless leg syndrome    Sleep apnea    wears c-pap   Vitamin D  deficiency    Wears glasses    Past Surgical History:  Procedure Laterality Date   ATRIAL FIBRILLATION ABLATION N/A 07/31/2021   Procedure: ATRIAL FIBRILLATION ABLATION;  Surgeon: Cindie Ole DASEN, MD;  Location: [facility];  Service: Cardiovascular;  Laterality: N/A;  ATRIAL FIBRILLATION ABLATION N/A 12/16/2023   Procedure: ATRIAL FIBRILLATION ABLATION;  Surgeon: Cindie Ole DASEN, MD;  Location: [facility] INVASIVE CV LAB;  Service: Cardiovascular;  Laterality: N/A;   BAND HEMORRHOIDECTOMY  2012   Dr Luis   BREAST BIOPSY Right 08/25/2022   US  RT BREAST BX W LOC DEV 1ST LESION IMG BX SPEC US  GUIDE 08/25/2022 [facility]   CARDIOVERSION N/A 04/08/2021   Procedure: CARDIOVERSION;  Surgeon: Mady Bruckner, MD;  Location: [facility] ORS;  Service: Cardiovascular;  Laterality: N/A;   CESAREAN SECTION  1994   COLONOSCOPY  06/18/2011   CYSTO/  BILATERAL PYELOGRAM RETROGRADES/  TRANSOBTURATOR SLING  03/08/2006   EXCISION OF SKIN TAG N/A 02/13/2016   Procedure: ANAL SKIN TAG EXCISION;  Surgeon: Bernarda Ned, MD;  Location: [facility];  Service: General;  Laterality: N/A;   LAPAROSCOPIC CHOLECYSTECTOMY  05/22/2001   SHOULDER SURGERY     FAMILY HISTORY Family History  Problem Relation Age of Onset   Cancer Mother    Colon cancer Mother    Diabetes Mother    Hypertension Mother    Atrial fibrillation Mother    Obesity Mother    Hyperlipidemia Father    Diabetes Father        committed suicide   Skin cancer Father    Suicidality Father    Hypertension Father    Diabetes Sister    Diabetes Sister    Breast cancer Other        materanl great aunt   Sleep apnea Neg Hx    Esophageal cancer Neg Hx    Rectal cancer Neg Hx    Stomach cancer Neg Hx    Restless legs syndrome Neg Hx    SOCIAL HISTORY Social History   Tobacco Use   Smoking status: Never    Smokeless tobacco: Never   Tobacco comments:    Never smoke 08/26/21  Vaping Use   Vaping status: Never Used  Substance Use Topics   Alcohol use: No    Alcohol/week: 0.0 standard drinks of alcohol   Drug use: No       OPHTHALMIC EXAM:  Base Eye Exam     Visual Acuity (Snellen - Linear)       Right Left   Dist cc 20/25 +1 20/20   Dist ph cc 20/20     Correction: Glasses         Tonometry (Tonopen, 1:30 PM)       Right Left   Pressure 13 15         Pupils       Pupils Dark Light Shape React APD   Right PERRL 4 3 Round Brisk None   Left PERRL 4 3 Round Brisk None         Visual Fields       Left Right    Full Full         Extraocular Movement       Right Left    Full, Ortho Full, Ortho         Neuro/Psych     Oriented x3: Yes   Mood/Affect: Normal         Dilation     Both eyes: 1.0% Mydriacyl, 2.5% Phenylephrine  @ 1:30 PM           Slit Lamp and Fundus Exam     Slit Lamp Exam       Right Left   Lids/Lashes Dermatochalasis Dermatochalasis   Conjunctiva/Sclera White and quiet  White and quiet   Cornea 1+ PEE, tear film debris 1+ PEE, tear film debris   Anterior Chamber Deep and quiet Deep and quiet   Iris Round and dilated Round and dilated   Lens 2+ Cortical cataract, 2+ Nuclear sclerosis 2+ Cortical cataract, 2+ Nuclear sclerosis   Anterior Vitreous mild syneresis mild syneresis         Fundus Exam       Right Left   Disc Pink and Sharp Pink and Sharp, mild PPP   C/D Ratio 0.2 0.2   Macula Flat, good foveal reflex, no heme or edema Flat, good foveal reflex, no heme or edema   Vessels Attenuated, mild tortuosity, mild AV crossing changes. Attenuated, mild tortuosity.   Periphery Attached, no heme Attached, mild pigmented cystoid degeneration, pavingstone degeneration peripherally           IMAGING AND PROCEDURES  Imaging and Procedures for 02/28/2024         ASSESSMENT/PLAN:   ICD-10-CM   1. Paracentral  acute middle maculopathy  H35.30 OCT, Retina - OU - Both Eyes    2. Diabetes mellitus type 2 without retinopathy (HCC)  E11.9     3. Long-term (current) use of injectable non-insulin  antidiabetic drugs  Z79.85     4. Essential hypertension  I10     5. Hypertensive retinopathy of both eyes  H35.033     6. Combined forms of age-related cataract of both eyes  H25.813      1. Paracentral acute middle maculopathy OD - OCT shows mild Focal IRHM (?PAMM) superior macula- improved - incidental finding on retinal exam by Dr. Octavia during consultation for diplopia - pt reports focal scotoma / blurred spot in inferior paracentral visual field, corresponding to focal IRHM  - discussed findings, diagnosis, prognosis - no retinal or ophthalmic interventions indicated or recommended  - recommend optimization of cardiovascular risk factors for ischemia -- already being followed closely by Cardiology due to history of Afib and recent ablation  2,3. Diabetes mellitus, type 2 without retinopathy  - A1C 5.8 (04.30.25) - The incidence, risk factors for progression, natural history and treatment options for diabetic retinopathy  were discussed with patient.   - The need for close monitoring of blood glucose, blood pressure, and serum lipids, avoiding cigarette or any type of tobacco, and the need for long term follow up was also discussed with patient. - f/u in 1 year, sooner prn   4,5. Hypertensive retinopathy OU - discussed importance of tight BP control - monitor   6. Mixed Cataracts OU - The symptoms of cataract, surgical options, and treatments and risks were discussed with patient. - discussed diagnosis and progression - monitor  Ophthalmic Meds Ordered this visit:  No orders of the defined types were placed in this encounter.    No follow-ups on file.  There are no Patient Instructions on file for this visit.  Explained the diagnoses, plan, and follow up with the patient and they  expressed understanding.  Patient expressed understanding of the importance of proper follow up care.   This document serves as a record of services personally performed by Redell JUDITHANN Hans, MD, PhD. It was created on their behalf by Avelina Pereyra, COA an ophthalmic technician. The creation of this record is the provider's dictation and/or activities during the visit.   Electronically signed by: Avelina GORMAN Pereyra, COT  02/28/24  1:51 PM   This document serves as a record of services personally performed by Redell JUDITHANN Hans,  MD, PhD. It was created on their behalf by Wanda GEANNIE Keens, COT an ophthalmic technician. The creation of this record is the provider's dictation and/or activities during the visit.    Electronically signed by:  Wanda GEANNIE Keens, COT  02/28/24 1:51 PM  Redell JUDITHANN Hans, M.D., Ph.D. Diseases & Surgery of the Retina and Vitreous [facility] 02/28/2024   Abbreviations: M myopia (nearsighted); A astigmatism; H hyperopia (farsighted); P presbyopia; Mrx spectacle prescription;  CTL contact lenses; OD right eye; OS left eye; OU both eyes  XT exotropia; ET esotropia; PEK punctate epithelial keratitis; PEE punctate epithelial erosions; DES dry eye syndrome; MGD meibomian gland dysfunction; ATs artificial tears; PFAT's preservative free artificial tears; NSC nuclear sclerotic cataract; PSC posterior subcapsular cataract; ERM epi-retinal membrane; PVD posterior vitreous detachment; RD retinal detachment; DM diabetes mellitus; DR diabetic retinopathy; NPDR non-proliferative diabetic retinopathy; PDR proliferative diabetic retinopathy; CSME clinically significant macular edema; DME diabetic macular edema; dbh dot blot hemorrhages; CWS cotton wool spot; POAG primary open angle glaucoma; C/D cup-to-disc ratio; HVF humphrey visual field; GVF goldmann visual field; OCT optical coherence tomography; IOP intraocular pressure; BRVO Branch retinal vein occlusion; CRVO central  retinal vein occlusion; CRAO central retinal artery occlusion; BRAO branch retinal artery occlusion; RT retinal tear; SB scleral buckle; PPV pars plana vitrectomy; VH Vitreous hemorrhage; PRP panretinal laser photocoagulation; IVK intravitreal kenalog; VMT vitreomacular traction; MH Macular hole;  NVD neovascularization of the disc; NVE neovascularization elsewhere; AREDS age related eye disease study; ARMD age related macular degeneration; POAG primary open angle glaucoma; EBMD epithelial/anterior basement membrane dystrophy; ACIOL anterior chamber intraocular lens; IOL intraocular lens; PCIOL posterior chamber intraocular lens; Phaco/IOL phacoemulsification with intraocular lens placement; PRK photorefractive keratectomy; LASIK laser assisted in situ keratomileusis; HTN hypertension; DM diabetes mellitus; COPD chronic obstructive pulmonary disease

## 2024-02-21 NOTE — Telephone Encounter (Signed)
 Prescription refill request for Eliquis  received. Indication: PAF Last office visit: 09/29/23  JAYSON Holts MD Scr: 0.68 on 12/17/23  Epic Age: 58 Weight: 102.1kg  Based on above findings Eliquis  5mg  twice daily is the appropriate dose.  Refill approved.

## 2024-02-22 ENCOUNTER — Telehealth (INDEPENDENT_AMBULATORY_CARE_PROVIDER_SITE_OTHER): Admitting: Adult Health

## 2024-02-22 DIAGNOSIS — G4733 Obstructive sleep apnea (adult) (pediatric): Secondary | ICD-10-CM | POA: Diagnosis not present

## 2024-02-22 DIAGNOSIS — G2581 Restless legs syndrome: Secondary | ICD-10-CM

## 2024-02-22 MED ORDER — PRAMIPEXOLE DIHYDROCHLORIDE 0.75 MG PO TABS: 0.7500 mg | ORAL_TABLET | Freq: Every day | ORAL | 7 refills | Status: AC

## 2024-02-22 NOTE — Patient Instructions (Signed)
 Continue using CPAP nightly and greater than 4 hours each night Continue Mirapex  0.75 mg for restless leg If your symptoms worsen or you develop new symptoms please let us  know.

## 2024-02-22 NOTE — Progress Notes (Signed)
 PATIENT: [name] Ross DOB: 21-Jun-1965  REASON FOR VISIT: follow up HISTORY FROM: patient PRIMARY NEUROLOGIST: Dr. Chalice   Virtual Visit via Video Note  I connected with [name] Ross on 02/22/24 at  3:00 PM EDT by a video enabled telemedicine application located remotely at [facility] and verified that I am speaking with the correct person using two identifiers who was located at their own home in [place].    I discussed the limitations of evaluation and management by telemedicine and the availability of in person appointments. The patient expressed understanding and agreed to proceed.   PATIENT: [name] Ross DOB: 04/24/1966  REASON FOR VISIT: follow up HISTORY FROM: patient  HISTORY OF PRESENT ILLNESS: Today 02/22/24  [name] Ross is a 58 y.o. female with a history of obstructive sleep apnea and restless leg syndrome. Returns today for follow-up.  She reports that CPAP is working well for her.  She states that she does not sleep long at night.  Does not have trouble falling asleep but rather trouble staying asleep.  She does report that she naps during the day.  She states that she can sleep several hours.  Reports that Mirapex  0.75 mg at bedtime works well for her.  She returns today for an evaluation.     HISTORY   REVIEW OF SYSTEMS: Out of a complete 14 system review of symptoms, the patient complains only of the following symptoms, and all other reviewed systems are negative.  ALLERGIES: Allergies  Allergen Reactions   Metformin Hcl     Other reaction(s): bloating     Venlafaxine     Sleepy    HOME MEDICATIONS: Outpatient Medications Prior to Visit  Medication Sig Dispense Refill   acetaminophen  (TYLENOL ) 500 MG tablet Take 1,000 mg by mouth every 6 (six) hours as needed for mild pain.     albuterol  (VENTOLIN  HFA) 108 (90 Base) MCG/ACT inhaler Inhale 2 puffs into the lungs every 6 (six) hours as needed for wheezing or shortness of breath.      amiodarone  (PACERONE ) 200 MG tablet Take 1 tablet (200 mg total) by mouth daily. 30 tablet 3   Ascorbic Acid  (VITAMIN C ) 1000 MG tablet Take 1,000 mg by mouth daily.     buPROPion  (WELLBUTRIN  XL) 300 MG 24 hr tablet Take 300 mg by mouth every morning.      butalbital -acetaminophen -caffeine  (FIORICET ) 50-325-40 MG tablet Take 1 tablet by mouth 2 (two) times daily as needed for headache.     CALCIUM  PO Take 1,200 tablets by mouth daily.     carvedilol  (COREG ) 25 MG tablet TAKE TWO TABLETS BY MOUTH TWICE DAILY 120 tablet 5   Cholecalciferol  (VITAMIN D -3 PO) Take 2,000 Units by mouth daily.     colchicine  0.6 MG tablet Take 1 tablet (0.6 mg total) by mouth 2 (two) times daily for 5 days. 10 tablet 0   dicyclomine  (BENTYL ) 10 MG capsule Take one tablet by mouth once to twice daily as needed 60 capsule 11   ELIQUIS  5 MG TABS tablet TAKE ONE TABLET BY MOUTH TWICE DAILY 60 tablet 5   escitalopram  (LEXAPRO ) 20 MG tablet Take 20 mg by mouth every morning.      estradiol  (ESTRACE ) 2 MG tablet Take 2 mg by mouth daily.     fenofibrate  micronized (LOFIBRA) 200 MG capsule Take 200 mg by mouth daily before breakfast.   4   fexofenadine (ALLEGRA) 180 MG tablet Take 180 mg by mouth  daily.     fluticasone  (FLONASE ) 50 MCG/ACT nasal spray Place 1 spray into both nostrils daily.     hydrOXYzine  (VISTARIL ) 25 MG capsule Take 25 mg by mouth at bedtime.     ibuprofen (ADVIL) 200 MG tablet Take 600-800 mg by mouth every 6 (six) hours as needed for moderate pain.     losartan  (COZAAR ) 50 MG tablet Take 50 mg by mouth daily.     lubiprostone  (AMITIZA ) 24 MCG capsule Take 1 capsule (24 mcg total) by mouth 2 (two) times daily with a meal. Please schedule a yearly follow up appointment for further refills. Thank you. 60 capsule 11   magnesium  oxide (MAG-OX) 400 (240 Mg) MG tablet Take 400 mg by mouth daily.     meclizine  (ANTIVERT ) 25 MG tablet Take 1 tablet (25 mg total) by mouth 3 (three) times daily as needed for  dizziness. 30 tablet 0   mometasone  (ELOCON ) 0.1 % ointment Apply 1 application. topically 2 (two) times daily as needed (rash).     omega-3 acid ethyl esters (LOVAZA ) 1 g capsule Take 1 capsule (1 g total) by mouth 2 (two) times daily. 60 capsule 5   omeprazole  (PRILOSEC) 40 MG capsule Take 1 capsule (40 mg total) by mouth in the morning and at bedtime. 60 capsule 1   ondansetron  (ZOFRAN -ODT) 8 MG disintegrating tablet Take 8 mg by mouth every 8 (eight) hours as needed for nausea or vomiting.     pantoprazole  (PROTONIX ) 40 MG tablet Take 1 tablet (40 mg total) by mouth daily. 45 tablet 0   pramipexole  (MIRAPEX ) 0.75 MG tablet TAKE ONE TABLET BY MOUTH AT BEDTIME 30 tablet 7   RABEprazole (ACIPHEX) 20 MG tablet Take 20 mg by mouth every morning.     Semaglutide , 2 MG/DOSE, (OZEMPIC , 2 MG/DOSE,) 8 MG/3ML SOPN Inject 2 mg into the skin once a week.     tacrolimus (PROTOPIC) 0.1 % ointment Apply 1 application. topically 2 (two) times daily as needed (rash).     triamcinolone ointment (KENALOG) 0.1 % Apply 1 Application topically 2 (two) times daily as needed.     triamterene -hydrochlorothiazide  (MAXZIDE -25) 37.5-25 MG tablet Take 0.5 tablets by mouth daily.     valACYclovir (VALTREX) 1000 MG tablet Take 1,000 mg by mouth 2 (two) times daily as needed (cold sores).     Vitamin D , Ergocalciferol , (DRISDOL ) 1.25 MG (50000 UNIT) CAPS capsule Take 1 capsule (50,000 Units total) by mouth every 7 (seven) days. 12 capsule 0   zolpidem  (AMBIEN ) 10 MG tablet Take 10 mg by mouth at bedtime as needed for sleep.     No facility-administered medications prior to visit.    PAST MEDICAL HISTORY: Past Medical History:  Diagnosis Date   Allergy    Anal skin tag    Anemia    Anxiety    Atrial fibrillation (HCC)    Atrial fibrillation (HCC)    Constipation    Depression    Diabetes mellitus without complication (HCC)    pre DM   Diverticulosis    Diverticulosis, sigmoid    Dyslipidemia    GERD  (gastroesophageal reflux disease)    Heartburn    Hemorrhoids    internal and external   History of diverticulitis    10/ 2016   History of galactorrhea    History of stress test    a. Pt reports h/o stress test and echo by cardiologist in [place] (no records in Epic).  Both reportedly nl.   Hyperlipidemia  Hypertension    IBS (irritable bowel syndrome)    Joint pain    Morbid obesity (HCC)    Palpitations    PONV (postoperative nausea and vomiting)    severe   Prediabetes    Rash    elbows and behind knees   Restless leg syndrome    Sleep apnea    wears c-pap   Vitamin D  deficiency    Wears glasses     PAST SURGICAL HISTORY: Past Surgical History:  Procedure Laterality Date   ATRIAL FIBRILLATION ABLATION N/A 07/31/2021   Procedure: ATRIAL FIBRILLATION ABLATION;  Surgeon: Cindie Ole DASEN, MD;  Location: [facility];  Service: Cardiovascular;  Laterality: N/A;   ATRIAL FIBRILLATION ABLATION N/A 12/16/2023   Procedure: ATRIAL FIBRILLATION ABLATION;  Surgeon: Cindie Ole DASEN, MD;  Location: [facility];  Service: Cardiovascular;  Laterality: N/A;   BAND HEMORRHOIDECTOMY  2012   Dr Luis   BREAST BIOPSY Right 08/25/2022   US  RT BREAST BX W LOC DEV 1ST LESION IMG BX SPEC US  GUIDE 08/25/2022 [facility]   CARDIOVERSION N/A 04/08/2021   Procedure: CARDIOVERSION;  Surgeon: Mady Bruckner, MD;  Location: [facility];  Service: Cardiovascular;  Laterality: N/A;   CESAREAN SECTION  1994   COLONOSCOPY  06/18/2011   CYSTO/  BILATERAL PYELOGRAM RETROGRADES/  TRANSOBTURATOR SLING  03/08/2006   EXCISION OF SKIN TAG N/A 02/13/2016   Procedure: ANAL SKIN TAG EXCISION;  Surgeon: Bernarda Ned, MD;  Location: [facility];  Service: General;  Laterality: N/A;   LAPAROSCOPIC CHOLECYSTECTOMY  05/22/2001   SHOULDER SURGERY      FAMILY HISTORY: Family History  Problem Relation Age of Onset   Cancer Mother    Colon cancer Mother    Diabetes Mother     Hypertension Mother    Atrial fibrillation Mother    Obesity Mother    Hyperlipidemia Father    Diabetes Father        committed suicide   Skin cancer Father    Suicidality Father    Hypertension Father    Diabetes Sister    Diabetes Sister    Breast cancer Other        materanl great aunt   Sleep apnea Neg Hx    Esophageal cancer Neg Hx    Rectal cancer Neg Hx    Stomach cancer Neg Hx    Restless legs syndrome Neg Hx     SOCIAL HISTORY: Social History   Socioeconomic History   Marital status: Married    Spouse name: Merchant navy officer   Number of children: 2   Years of education: RN   Highest education level: Not on file  Occupational History   Occupation: RN  Tobacco Use   Smoking status: Never   Smokeless tobacco: Never   Tobacco comments:    Never smoke 08/26/21  Vaping Use   Vaping status: Never Used  Substance and Sexual Activity   Alcohol use: No    Alcohol/week: 0.0 standard drinks of alcohol   Drug use: No   Sexual activity: Not on file    Comment: post menopausal per pt  Other Topics Concern   Not on file  Social History Narrative   Lives in [place].  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  Occasionally drinks coffee but not a heavy caffeine  drinker.     Social Drivers of Corporate investment banker Strain: Not on file  Food Insecurity: Not on file  Transportation Needs:  Not on file  Physical Activity: Not on file  Stress: Not on file  Social Connections: Not on file  Intimate Partner Violence: Not on file      PHYSICAL EXAM Generalized: Well developed, in no acute distress   Neurological examination  Mentation: Alert oriented to time, place, history taking. Follows all commands speech and language fluent Cranial nerve II-XII: Facial symmetry noted.   DIAGNOSTIC DATA (LABS, IMAGING, TESTING) - I reviewed patient records, labs, notes, testing and imaging myself where available.  Lab Results  Component Value Date   WBC 8.8 12/17/2023    HGB 12.0 12/17/2023   HCT 35.3 (L) 12/17/2023   MCV 88.5 12/17/2023   PLT 350 12/17/2023      Component Value Date/Time   NA 138 12/17/2023 1921   NA 138 12/10/2023 1328   K 3.5 12/17/2023 1921   CL 104 12/17/2023 1921   CO2 25 12/17/2023 1921   GLUCOSE 84 12/17/2023 1921   BUN 14 12/17/2023 1921   BUN 18 12/10/2023 1328   CREATININE 0.68 12/17/2023 1921   CALCIUM  8.9 12/17/2023 1921   PROT 6.2 (L) 12/17/2023 1921   PROT 6.3 12/10/2023 1328   ALBUMIN 3.2 (L) 12/17/2023 1921   ALBUMIN 3.9 12/10/2023 1328   AST 24 12/17/2023 1921   ALT 12 12/17/2023 1921   ALKPHOS 26 (L) 12/17/2023 1921   BILITOT 0.5 12/17/2023 1921   BILITOT <0.2 12/10/2023 1328   GFRNONAA >60 12/17/2023 1921   GFRAA >60 12/20/2019 0502   Lab Results  Component Value Date   CHOL 169 09/22/2023   HDL 59 09/22/2023   LDLCALC 61 09/22/2023   LDLDIRECT 66.8 12/20/2019   TRIG 319 (H) 09/22/2023   CHOLHDL 5.2 12/20/2019   Lab Results  Component Value Date   HGBA1C 5.8 (H) 09/22/2023   Lab Results  Component Value Date   VITAMINB12 197 (L) 09/22/2023   Lab Results  Component Value Date   TSH 2.770 12/10/2023      ASSESSMENT AND PLAN 58 y.o. year old female  has a past medical history of Allergy, Anal skin tag, Anemia, Anxiety, Atrial fibrillation (HCC), Atrial fibrillation (HCC), Constipation, Depression, Diabetes mellitus without complication (HCC), Diverticulosis, Diverticulosis, sigmoid, Dyslipidemia, GERD (gastroesophageal reflux disease), Heartburn, Hemorrhoids, History of diverticulitis, History of galactorrhea, History of stress test, Hyperlipidemia, Hypertension, IBS (irritable bowel syndrome), Joint pain, Morbid obesity (HCC), Palpitations, PONV (postoperative nausea and vomiting), Prediabetes, Rash, Restless leg syndrome, Sleep apnea, Vitamin D  deficiency, and Wears glasses. here with:  OSA on CPAP   CPAP compliance excellent Residual AHI is good Encouraged patient to continue using  CPAP nightly and > 4 hours each night   Restless leg syndrome Continue Mirapex  0.75 mg at bedtime  FU in 1 year or sooner if needed.     Duwaine Russell, MSN, NP-C 02/22/2024, 2:58 PM [facility] [address], [address] [place], [place] [address] [phone]  The patient's condition requires frequent monitoring and adjustments in the treatment plan, reflecting the ongoing complexity of care.  This provider is the continuing focal point for all needed services for this condition.

## 2024-02-28 ENCOUNTER — Ambulatory Visit (INDEPENDENT_AMBULATORY_CARE_PROVIDER_SITE_OTHER): Admitting: Ophthalmology

## 2024-02-28 ENCOUNTER — Encounter (INDEPENDENT_AMBULATORY_CARE_PROVIDER_SITE_OTHER): Payer: Self-pay | Admitting: Ophthalmology

## 2024-02-28 DIAGNOSIS — Z7985 Long-term (current) use of injectable non-insulin antidiabetic drugs: Secondary | ICD-10-CM

## 2024-02-28 DIAGNOSIS — E119 Type 2 diabetes mellitus without complications: Secondary | ICD-10-CM

## 2024-02-28 DIAGNOSIS — I10 Essential (primary) hypertension: Secondary | ICD-10-CM | POA: Diagnosis not present

## 2024-02-28 DIAGNOSIS — H25813 Combined forms of age-related cataract, bilateral: Secondary | ICD-10-CM | POA: Diagnosis not present

## 2024-02-28 DIAGNOSIS — H35033 Hypertensive retinopathy, bilateral: Secondary | ICD-10-CM

## 2024-02-28 DIAGNOSIS — H3530 Unspecified macular degeneration: Secondary | ICD-10-CM | POA: Diagnosis not present

## 2024-02-29 ENCOUNTER — Encounter (INDEPENDENT_AMBULATORY_CARE_PROVIDER_SITE_OTHER): Payer: Self-pay | Admitting: Ophthalmology

## 2024-03-01 ENCOUNTER — Telehealth: Payer: Self-pay | Admitting: Pharmacy Technician

## 2024-03-01 MED ORDER — AMIODARONE HCL 200 MG PO TABS: 200.0000 mg | ORAL_TABLET | Freq: Every day | ORAL | 3 refills | Status: DC

## 2024-03-01 NOTE — Addendum Note (Signed)
 Addended by: MAUDINE SLOUGH D on: 03/01/2024 08:23 AM   Modules accepted: Orders

## 2024-03-01 NOTE — Telephone Encounter (Signed)
 Hi, we received a refill request for amiodarone  from [employer]. Thank you

## 2024-03-03 ENCOUNTER — Other Ambulatory Visit (INDEPENDENT_AMBULATORY_CARE_PROVIDER_SITE_OTHER): Payer: Self-pay | Admitting: Family Medicine

## 2024-03-03 DIAGNOSIS — E559 Vitamin D deficiency, unspecified: Secondary | ICD-10-CM

## 2024-03-10 NOTE — Progress Notes (Deleted)
 Cardiology Office Note    Date:  03/10/2024   ID:  [name] Ross, DOB 1965-09-02, MRN [id number]  PCP:  [name] Toribio MATSU, MD  Cardiologist:  Redell Cave, MD  Electrophysiologist:  OLE ONEIDA HOLTS, MD   Chief Complaint: Follow-up  History of Present Illness:   [name] Ross is a 58 y.o. female with history of normal coronary arteries by CTA in 07/2021 and 11/2023, A-fib/flutter status post ablation in 07/2021 status post redo ablation in 11/2023, SVT, HTN, hypertriglyceridemia, obesity, OSA on CPAP, and GERD who presents for follow-up of HTN and hypertriglyceridemia.  She was admitted in 11/2019 with A. fib with RVR.  Echo in 11/2019 showed an EF of 60 to 65%, no regional wall motion abnormalities, normal LV diastolic function parameters, normal RV systolic function and ventricular cavity size, and trivial mitral regurgitation.  She was placed on anticoagulation and carvedilol .  Subsequent office visit in 12/2019 documented sinus rhythm.  Due to recurrence of palpitations, outpatient cardiac monitoring in 07/2020 demonstrated a predominant rhythm of sinus with an average heart rate of 83 bpm (range 56 to 197 bpm), 148 episodes of SVT occurred with the longest lasting 13 beats, A. fib was noted with a less than 1% burden with the longest episode lasting 28 minutes and 17 seconds.  SVT and A. fib were detected with patient triggered events.  She was initiated on flecainide  therapy in 06/2020 with continuation of carvedilol  and diltiazem .  Treadmill MPI on 08/30/2020, demonstrated she was able to exercise for 5 minutes and was not able to achieve 85% MPHR, therefore was transitioned to Lexiscan .  Stress test was low risk overall.  CT attenuated corrected images showed no evidence of coronary artery calcifications or aortic atherosclerosis.  She was admitted in 03/2021 for A. fib with RVR and subsequently cardioverted on 04/08/2021.  Echo during that admission demonstrated an EF of 60 to 65%, no regional  wall motion abnormalities, mild asymmetric left ventricular hypertrophy of the basal septal segment, indeterminate LV diastolic function parameters, normal RV systolic function and ventricular cavity size, and a trivial mitral regurgitation.  Coronary CTA in 07/2021 showed a calcium  score of 0 and no evidence of coronary artery disease.  She underwent successful A-fib as well as CTI dependent flutter ablation on 07/31/2021.  After follow-up with EP in 09/2021, she did continue to have some intermittent palpitations, though these appeared to be consistent with PACs or short runs of atrial tach.  Zio patch in 05/2022 showed a predominant rhythm of sinus with rare atrial and ventricular ectopy and no evidence of A-fib.  Flecainide  was subsequently discontinued.    In the spring 2025 she noted an increase in A-fib episodes leading to the reinitiation of flecainide .  ETT in 08/2023 showed no evidence of QRS widening and maximum exercise on flecainide .  She was subsequently seen in the ED in 09/2023 with symptomatic A-fib and was started on amiodarone .  She subsequently underwent A-fib/flutter redo ablation on 12/16/2023.  She was seen in the ED one day later with transient diplopia lasting for 10 seconds.  CTA of the head and neck showed no evidence of acute intracranial abnormality and no large vessel occlusion or hemodynamically significant stenosis.  MRI of the brain showed no evidence of acute intracranial abnormality.  She has post ablation follow-up with the EP next week.  ***   Labs independently reviewed: 11/2023 - Hgb 12.0, PLT 350, potassium 3.5, BUN 14, serum creatinine 0.68, BUN 3.2, AST/ALT normal, TSH normal  09/2023 - magnesium  1.8 08/2023 - TC 169, TG 319, HDL 59, LDL 61, A1c 5.8  Past Medical History:  Diagnosis Date   Allergy    Anal skin tag    Anemia    Anxiety    Atrial fibrillation (HCC)    Atrial fibrillation (HCC)    Constipation    Depression    Diabetes mellitus without complication  (HCC)    pre DM   Diverticulosis    Diverticulosis, sigmoid    Dyslipidemia    GERD (gastroesophageal reflux disease)    Heartburn    Hemorrhoids    internal and external   History of diverticulitis    10/ 2016   History of galactorrhea    History of stress test    a. Pt reports h/o stress test and echo by cardiologist in [place] (no records in Epic).  Both reportedly nl.   Hyperlipidemia    Hypertension    IBS (irritable bowel syndrome)    Joint pain    Morbid obesity (HCC)    Palpitations    PONV (postoperative nausea and vomiting)    severe   Prediabetes    Rash    elbows and behind knees   Restless leg syndrome    Sleep apnea    wears c-pap   Vitamin D  deficiency    Wears glasses     Past Surgical History:  Procedure Laterality Date   ATRIAL FIBRILLATION ABLATION N/A 07/31/2021   Procedure: ATRIAL FIBRILLATION ABLATION;  Surgeon: Cindie Ole DASEN, MD;  Location: [facility];  Service: Cardiovascular;  Laterality: N/A;   ATRIAL FIBRILLATION ABLATION N/A 12/16/2023   Procedure: ATRIAL FIBRILLATION ABLATION;  Surgeon: Cindie Ole DASEN, MD;  Location: [facility];  Service: Cardiovascular;  Laterality: N/A;   BAND HEMORRHOIDECTOMY  2012   Dr Luis   BREAST BIOPSY Right 08/25/2022   US  RT BREAST BX W LOC DEV 1ST LESION IMG BX SPEC US  GUIDE 08/25/2022 [facility]   CARDIOVERSION N/A 04/08/2021   Procedure: CARDIOVERSION;  Surgeon: Mady Bruckner, MD;  Location: [facility];  Service: Cardiovascular;  Laterality: N/A;   CESAREAN SECTION  1994   COLONOSCOPY  06/18/2011   CYSTO/  BILATERAL PYELOGRAM RETROGRADES/  TRANSOBTURATOR SLING  03/08/2006   EXCISION OF SKIN TAG N/A 02/13/2016   Procedure: ANAL SKIN TAG EXCISION;  Surgeon: Bernarda Ned, MD;  Location: [facility];  Service: General;  Laterality: N/A;   LAPAROSCOPIC CHOLECYSTECTOMY  05/22/2001   SHOULDER SURGERY      Current Medications: No outpatient medications have been marked as  taking for the 03/14/24 encounter (Appointment) with Abigail Bernardino HERO, PA-C.    Allergies:   Metformin hcl and Venlafaxine   Social History   Socioeconomic History   Marital status: Married    Spouse name: Oneil   Number of children: 2   Years of education: RN   Highest education level: Not on file  Occupational History   Occupation: RN  Tobacco Use   Smoking status: Never   Smokeless tobacco: Never   Tobacco comments:    Never smoke 08/26/21  Vaping Use   Vaping status: Never Used  Substance and Sexual Activity   Alcohol use: No    Alcohol/week: 0.0 standard drinks of alcohol   Drug use: No   Sexual activity: Not on file    Comment: post menopausal per pt  Other Topics Concern   Not on file  Social History Narrative   Lives in [place].  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  Occasionally drinks coffee but not a heavy caffeine  drinker.     Social Drivers of Corporate investment banker Strain: Not on file  Food Insecurity: Not on file  Transportation Needs: Not on file  Physical Activity: Not on file  Stress: Not on file  Social Connections: Not on file     Family History:  The patient's family history includes Atrial fibrillation in her mother; Breast cancer in an other family member; Cancer in her mother; Colon cancer in her mother; Diabetes in her father, mother, sister, and sister; Hyperlipidemia in her father; Hypertension in her father and mother; Obesity in her mother; Skin cancer in her father; Suicidality in her father. There is no history of Sleep apnea, Esophageal cancer, Rectal cancer, Stomach cancer, or Restless legs syndrome.  ROS:   12-point review of systems is negative unless otherwise noted in the HPI.   EKGs/Labs/Other Studies Reviewed:    Studies reviewed were summarized above. The additional studies were reviewed today:  ETT 09/14/2023:   Baseline ECG demonstrates normal sinus rhythm with low voltage.   The patient demonstrates  decreased functional capacity, exercising for 6:00 minutes and achieving a heart rate of 112 bpm (69% MPHR).  No angina was reported.   No significant ST segment or T wave abnormalities were observed.   No arrhythmia was seen.  There was no QRS widening or other conduction abnormality during stress or recovery.   Non-diagnostic exercise tolerance test for ischemia due to failure to achieve target heart rate.  No significant ECG changes observed at workload achieved. __________  Zio patch 05/2022: HR 70 - 197 bpm, average 82 bpm. 1 nonsustained SVT, longest 4 beats. Rare supraventricular and ventricular ectopy. No atrial fibrillation. All symptom trigger episodes correspond to sinus rhythm with PAC or PVC. __________  Coronary CTA 07/24/2021: Aorta:  Normal size.  No calcifications.  No dissection.   Aortic Valve:  Trileaflet.  No calcifications.   Coronary Arteries:  Normal coronary origin.  Right dominance.   RCA is a dominant artery that gives rise to PDA and PLA. There is no plaque.   Left main is a large artery that gives rise to LAD and LCX arteries. LM has no disease   LAD has no plaque.   LCX is a non-dominant artery that gives rise to one OM1 branch. There is no plaque.   Other findings:   Normal pulmonary vein drainage into the left atrium.   Normal left atrial appendage without a thrombus.   Normal size of the pulmonary artery.   IMPRESSION: 1. Normal coronary calcium  score of 0. Patient is low risk for coronary events. 2. Normal coronary origin with right dominance. 3. No evidence of CAD. 4. CAD-RADS 0. Consider non-atherosclerotic causes of chest pain. __________   2D echo 04/07/2021: 1. Left ventricular ejection fraction, by estimation, is 60 to 65%. The  left ventricle has normal function. The left ventricle has no regional  wall motion abnormalities. There is mild asymmetric left ventricular  hypertrophy of the basal-septal segment.  Left ventricular  diastolic parameters are indeterminate.   2. Right ventricular systolic function is normal. The right ventricular  size is normal. Tricuspid regurgitation signal is inadequate for assessing  PA pressure.   3. The mitral valve is normal in structure. Trivial mitral valve  regurgitation. No evidence of mitral stenosis.   4. The aortic valve is normal in structure. Aortic valve regurgitation is  not visualized. No aortic stenosis is present. __________   Lexiscan  MPI 08/30/2020: Blood pressure demonstrated a normal response to exercise. The patient exercised for 5 minutes and was not able to achieve 85% maximum predicted heart rate and thus switched to Lexiscan . There was no ST segment deviation noted during stress. The study is normal. This is a low risk study. The left ventricular ejection fraction is normal (55-65%). CT attenuation images showed no evidence of aortic or coronary calcifications. __________   Zio patch 06/2020: Patient had a min HR of 53 bpm, max HR of 197 bpm, and avg HR of 83 bpm. Predominant underlying rhythm was Sinus Rhythm. 148 Supraventricular Tachycardia runs occurred, the run with the fastest interval lasting 4 beats with a max rate of 197 bpm, the  longest lasting 13 beats with an avg rate of 127 bpm. Atrial Fibrillation occurred (<1% burden), ranging from 74-166 bpm (avg of 112 bpm), the longest lasting 28 mins 17 secs with an avg rate of 113 bpm. Supraventricular Tachycardia and Atrial  Fibrillation were detected within +/- 45 seconds of symptomatic patient event(s). __________   2D echo 12/20/2019: 1. Left ventricular ejection fraction, by estimation, is 60 to 65%. The  left ventricle has normal function. The left ventricle has no regional  wall motion abnormalities. Left ventricular diastolic parameters were  normal.   2. Right ventricular systolic function is normal. The right ventricular  size is normal.   3. The mitral valve is normal in structure. Trivial  mitral valve  regurgitation.   4. The aortic valve is normal in structure. Aortic valve regurgitation is not visualized.   EKG:  EKG is ordered today.  The EKG ordered today demonstrates ***  Recent Labs: 09/24/2023: Magnesium  1.8 12/10/2023: TSH 2.770 12/17/2023: ALT 12; BUN 14; Creatinine, Ser 0.68; Hemoglobin 12.0; Platelets 350; Potassium 3.5; Sodium 138  Recent Lipid Panel    Component Value Date/Time   CHOL 169 09/22/2023 0928   TRIG 319 (H) 09/22/2023 0928   HDL 59 09/22/2023 0928   CHOLHDL 5.2 12/20/2019 0502   VLDL UNABLE TO CALCULATE IF TRIGLYCERIDE OVER 400 mg/dL 92/71/7978 9497   LDLCALC 61 09/22/2023 0928   LDLDIRECT 66.8 12/20/2019 0502    PHYSICAL EXAM:    VS:  LMP 02/13/2015   BMI: There is no height or weight on file to calculate BMI.  Physical Exam  Wt Readings from Last 3 Encounters:  12/17/23 220 lb (99.8 kg)  12/16/23 220 lb (99.8 kg)  11/30/23 217 lb (98.4 kg)     ASSESSMENT & PLAN:   Persistent A-fib/flutter: ***.  Status post ablation in 07/2021 with recurrent A-fib/flutter status post redo ablation in 11/2023.  CHA2DS2-VASc at least 2 (HTN, sex category).  Paroxysmal SVT:  HTN: Blood pressure  Hypertriglyceridemia:  Coronary CTA in 07/2023 showed normal coronary arteries with coronary morphology CT in 11/2023 showing a calcium  score of 0.  LDL 61 with a triglyceride of 319 in 08/2023.   {Are you ordering a CV Procedure (e.g. stress test, cath, DCCV, TEE, etc)?   Press F2        :[id number]}     Disposition: F/u with Dr. Darliss or an APP in ***, and EP as directed.    Medication Adjustments/Labs and Tests Ordered: Current medicines are reviewed at length with the patient today.  Concerns regarding medicines are outlined above. Medication changes, Labs and Tests ordered today are summarized above and listed in the Patient Instructions accessible in Encounters.   Signed, Bernardino  Mollie Rossano, PA-C 03/10/2024 11:10 AM      [facility] -  [facility] [address] [place], [place] [address] [phone]

## 2024-03-13 ENCOUNTER — Other Ambulatory Visit: Payer: Self-pay | Admitting: Cardiology

## 2024-03-14 ENCOUNTER — Ambulatory Visit: Admitting: Physician Assistant

## 2024-03-20 ENCOUNTER — Ambulatory Visit: Admitting: Cardiology

## 2024-03-21 NOTE — Progress Notes (Unsigned)
  Electrophysiology Office Follow up Visit Note:    Date:  03/22/2024   ID:  [name] Ross, DOB 22-Jul-1965, MRN [id number]  PCP:  Yolande Toribio MATSU, MD  [facility] Cardiologist:  Redell Cave, MD  [facility] Electrophysiologist:  OLE ONEIDA HOLTS, MD    Interval History:     [name] Ross is a 58 y.o. female who presents for a follow up visit.   The patient had an atrial fibrillation ablation on December 16, 2023. She had trouble double vision after the procedure.  She was seen by retina specialist.   She is doing well.  No problems with her heart rhythm.  She is feeling well.  Continues to take Eliquis  without bleeding issues.      Past medical, surgical, social and family history were reviewed.  ROS:   Please see the history of present illness.    All other systems reviewed and are negative.  EKGs/Labs/Other Studies Reviewed:    The following studies were reviewed today:     EKG Interpretation Date/Time:  Wednesday March 22 2024 11:49:19 EDT Ventricular Rate:  84 PR Interval:  172 QRS Duration:  74 QT Interval:  390 QTC Calculation: 460 R Axis:   -10  Text Interpretation: Normal sinus rhythm Normal ECG Confirmed by Holts Ole [phone]) on 03/22/2024 11:52:57 AM    Physical Exam:    VS:  BP 90/60 (BP Location: Left Arm, Patient Position: Sitting, Cuff Size: Normal)   Pulse 84 Comment: 85 oximeter  Ht 5' 6 (1.676 m)   Wt 223 lb 3.2 oz (101.2 kg)   LMP 02/13/2015   SpO2 98%   BMI 36.03 kg/m     Wt Readings from Last 3 Encounters:  03/22/24 223 lb 3.2 oz (101.2 kg)  12/17/23 220 lb (99.8 kg)  12/16/23 220 lb (99.8 kg)     GEN: no distress CARD: RRR, No MRG RESP: No IWOB. CTAB.      ASSESSMENT:    1. Encounter for monitoring amiodarone  therapy   2. Persistent atrial fibrillation (HCC)   3. Primary hypertension    PLAN:    In order of problems listed above:  #Persistent atrial fibrillation #High risk med  monitoring-amiodarone  Post redo catheter ablation in July.  Her rhythm remained stable without known recurrence of arrhythmia.  Continue Coreg , Eliquis  Decrease amiodarone  to 100 mg by mouth once daily.  Thyroid  and liver function test in July were okay.  Recommend repeating these at the next office visit.  #Hypertension At goal today.  Recommend checking blood pressures 1-2 times per week at home and recording the values.  Recommend bringing these recordings to the primary care physician.  I discussed my upcoming departure from [facility] during today's office visit.  She will transition her care to one of my partners, Dr. Kennyth.  Follow-up 6 months.   Signed, Ole Holts, MD, [facility], [facility] 03/22/2024 11:56 AM    Electrophysiology [facility]

## 2024-03-22 ENCOUNTER — Ambulatory Visit: Attending: Cardiology | Admitting: Cardiology

## 2024-03-22 ENCOUNTER — Other Ambulatory Visit (INDEPENDENT_AMBULATORY_CARE_PROVIDER_SITE_OTHER): Payer: Self-pay | Admitting: Family Medicine

## 2024-03-22 ENCOUNTER — Encounter: Payer: Self-pay | Admitting: Cardiology

## 2024-03-22 VITALS — BP 90/60 | HR 84 | Ht 66.0 in | Wt 223.2 lb

## 2024-03-22 DIAGNOSIS — E559 Vitamin D deficiency, unspecified: Secondary | ICD-10-CM

## 2024-03-22 DIAGNOSIS — Z5181 Encounter for therapeutic drug level monitoring: Secondary | ICD-10-CM | POA: Diagnosis not present

## 2024-03-22 DIAGNOSIS — I4819 Other persistent atrial fibrillation: Secondary | ICD-10-CM | POA: Diagnosis not present

## 2024-03-22 DIAGNOSIS — Z79899 Other long term (current) drug therapy: Secondary | ICD-10-CM

## 2024-03-22 DIAGNOSIS — I10 Essential (primary) hypertension: Secondary | ICD-10-CM

## 2024-03-22 MED ORDER — AMIODARONE HCL 100 MG PO TABS: 100.0000 mg | ORAL_TABLET | Freq: Every day | ORAL | 3 refills | Status: AC

## 2024-03-22 NOTE — Patient Instructions (Signed)
 Medication Instructions:  Your physician has recommended you make the following change in your medication:   1) DECREASE amiodarone  to 100 mg once daily  *If you need a refill on your cardiac medications before your next appointment, please call your pharmacy*  Follow-Up: At [facility], you and your health needs are our priority.  As part of our continuing mission to provide you with exceptional heart care, our providers are all part of one team.  This team includes your primary Cardiologist (physician) and Advanced Practice Providers or APPs (Physician Assistants and Nurse Practitioners) who all work together to provide you with the care you need, when you need it.  Your next appointment:   6 months  Provider:   Suzann Riddle, NP

## 2024-03-24 DIAGNOSIS — G4733 Obstructive sleep apnea (adult) (pediatric): Secondary | ICD-10-CM | POA: Diagnosis not present

## 2024-03-31 ENCOUNTER — Other Ambulatory Visit (INDEPENDENT_AMBULATORY_CARE_PROVIDER_SITE_OTHER): Payer: Self-pay | Admitting: Family Medicine

## 2024-03-31 DIAGNOSIS — E559 Vitamin D deficiency, unspecified: Secondary | ICD-10-CM

## 2024-04-04 ENCOUNTER — Encounter: Payer: Self-pay | Admitting: Physician Assistant

## 2024-04-04 ENCOUNTER — Ambulatory Visit: Attending: Physician Assistant | Admitting: Physician Assistant

## 2024-04-04 VITALS — BP 122/86 | HR 78 | Ht 66.0 in | Wt 226.6 lb

## 2024-04-04 DIAGNOSIS — I4819 Other persistent atrial fibrillation: Secondary | ICD-10-CM

## 2024-04-04 DIAGNOSIS — I471 Supraventricular tachycardia, unspecified: Secondary | ICD-10-CM

## 2024-04-04 DIAGNOSIS — I480 Paroxysmal atrial fibrillation: Secondary | ICD-10-CM | POA: Diagnosis not present

## 2024-04-04 DIAGNOSIS — Z79899 Other long term (current) drug therapy: Secondary | ICD-10-CM

## 2024-04-04 DIAGNOSIS — I10 Essential (primary) hypertension: Secondary | ICD-10-CM

## 2024-04-04 DIAGNOSIS — E781 Pure hyperglyceridemia: Secondary | ICD-10-CM

## 2024-04-04 NOTE — Patient Instructions (Signed)
 Medication Instructions:  Your physician recommends that you continue on your current medications as directed. Please refer to the Current Medication list given to you today.   *If you need a refill on your cardiac medications before your next appointment, please call your pharmacy*  Lab Work: Your provider would like for you to return in the next week to have the following labs drawn: Lipids and AST/ALT.   Please go to [facility] [address] ([facility] [address], [place] [address] You do not need an appointment.  They are open from 8 am- 4:30 pm.  Lunch from 1:00 pm- 2:00 pm You DO  need to be fasting.   You may also go to one of the following [facility]:  [address] [place], [place] [address] Phone: [phone] Lab hours: Mon-Fri 8 am- 5 pm    Lunch 12 pm- 1 pm  [address] [place],  [place]  [address]  [place]  Phone: [phone] Lab hours: 7 am- 4 pm Lunch 12 pm-1 pm   [address] [place],  [place]  [address]  [place]  Phone: [phone] Lab hours: Mon-Fri 8 am- 5 pm    Lunch 12 pm- 1 pm  If you have labs (blood work) drawn today and your tests are completely normal, you will receive your results only by: MyChart Message (if you have MyChart) OR A paper copy in the mail If you have any lab test that is abnormal or we need to change your treatment, we will call you to review the results.  Follow-Up: At [facility], you and your health needs are our priority.  As part of our continuing mission to provide you with exceptional heart care, our providers are all part of one team.  This team includes your primary Cardiologist (physician) and Advanced Practice Providers or APPs (Physician Assistants and Nurse Practitioners) who all work together to provide you with the care you need, when you need it.  Your next appointment:   1 year(s)  Provider:   You may see Redell Cave, MD or Bernardino Bring, PA-C

## 2024-04-04 NOTE — Progress Notes (Signed)
 Cardiology Office Note    Date:  04/04/2024   ID:  [name] Ross, DOB Aug 25, 1965, MRN [id number]  PCP:  Yolande Toribio MATSU, MD  Cardiologist:  Redell Cave, MD  Electrophysiologist:  OLE ONEIDA HOLTS, MD   Chief Complaint: Follow-up  History of Present Illness:   [name] Ross is a 58 y.o. female with history of normal coronary arteries by CTA in 07/2021 and 11/2023, A-fib/flutter status post ablation in 07/2021 status post redo ablation in 11/2023, SVT, HTN, hypertriglyceridemia, obesity, OSA on CPAP, and GERD who presents for follow-up of HTN and hypertriglyceridemia.  She was admitted in 11/2019 with A. fib with RVR.  Echo in 11/2019 showed an EF of 60 to 65%, no regional wall motion abnormalities, normal LV diastolic function parameters, normal RV systolic function and ventricular cavity size, and trivial mitral regurgitation.  She was placed on anticoagulation and carvedilol .  Subsequent office visit in 12/2019 documented sinus rhythm.  Due to recurrence of palpitations, outpatient cardiac monitoring in 07/2020 demonstrated a predominant rhythm of sinus with an average heart rate of 83 bpm (range 56 to 197 bpm), 148 episodes of SVT occurred with the longest lasting 13 beats, A. fib was noted with a less than 1% burden with the longest episode lasting 28 minutes and 17 seconds.  SVT and A. fib were detected with patient triggered events.  She was initiated on flecainide  therapy in 06/2020 with continuation of carvedilol  and diltiazem .  Treadmill MPI on 08/30/2020, demonstrated she was able to exercise for 5 minutes and was not able to achieve 85% MPHR, therefore was transitioned to Lexiscan .  Stress test was low risk overall.  CT attenuated corrected images showed no evidence of coronary artery calcifications or aortic atherosclerosis.  She was admitted in 03/2021 for A. fib with RVR and subsequently cardioverted on 04/08/2021.  Echo during that admission demonstrated an EF of 60 to 65%, no regional  wall motion abnormalities, mild asymmetric left ventricular hypertrophy of the basal septal segment, indeterminate LV diastolic function parameters, normal RV systolic function and ventricular cavity size, and a trivial mitral regurgitation.  Coronary CTA in 07/2021 showed a calcium  score of 0 and no evidence of coronary artery disease.  She underwent successful A-fib as well as CTI dependent flutter ablation on 07/31/2021.  After follow-up with EP in 09/2021, she did continue to have some intermittent palpitations, though these appeared to be consistent with PACs or short runs of atrial tach.  Zio patch in 05/2022 showed a predominant rhythm of sinus with rare atrial and ventricular ectopy and no evidence of A-fib.  Flecainide  was subsequently discontinued.    In the spring 2025 she noted an increase in A-fib episodes leading to the reinitiation of flecainide .  ETT in 08/2023 showed no evidence of QRS widening and maximum exercise on flecainide .  She was subsequently seen in the ED in 09/2023 with symptomatic A-fib and was started on amiodarone .  She subsequently underwent A-fib/flutter redo ablation on 12/16/2023.  She was seen in the ED one day later with transient diplopia lasting for 10 seconds.  CTA of the head and neck showed no evidence of acute intracranial abnormality and no large vessel occlusion or hemodynamically significant stenosis.  MRI of the brain showed no evidence of acute intracranial abnormality.  She followed up with EP in late 02/2024 with recommendation to continue apixaban  and reduce amiodarone  to 100 mg daily.  She comes in doing well from a cardiac perspective and remains without symptoms of angina  or cardiac decompensation.  She does note some positional dizziness, typically after taking medications in the morning.  No near-syncope or syncope.  No lower extremity swelling or progressive orthopnea.  No falls or symptoms concerning for bleeding.  Does not have any acute cardiac concerns at  this time.   Labs independently reviewed: 11/2023 - Hgb 12.0, PLT 350, potassium 3.5, BUN 14, serum creatinine 0.68, BUN 3.2, AST/ALT normal, TSH normal 09/2023 - magnesium  1.8 08/2023 - TC 169, TG 319, HDL 59, LDL 61, A1c 5.8  Past Medical History:  Diagnosis Date   Allergy    Anal skin tag    Anemia    Anxiety    Atrial fibrillation (HCC)    Atrial fibrillation (HCC)    Constipation    Depression    Diabetes mellitus without complication (HCC)    pre DM   Diverticulosis    Diverticulosis, sigmoid    Dyslipidemia    GERD (gastroesophageal reflux disease)    Heartburn    Hemorrhoids    internal and external   History of diverticulitis    10/ 2016   History of galactorrhea    History of stress test    a. Pt reports h/o stress test and echo by cardiologist in [place] (no records in Epic).  Both reportedly nl.   Hyperlipidemia    Hypertension    IBS (irritable bowel syndrome)    Joint pain    Morbid obesity (HCC)    Palpitations    PONV (postoperative nausea and vomiting)    severe   Prediabetes    Rash    elbows and behind knees   Restless leg syndrome    Sleep apnea    wears c-pap   Vitamin D  deficiency    Wears glasses     Past Surgical History:  Procedure Laterality Date   ATRIAL FIBRILLATION ABLATION N/A 07/31/2021   Procedure: ATRIAL FIBRILLATION ABLATION;  Surgeon: Cindie Ole DASEN, MD;  Location: [facility];  Service: Cardiovascular;  Laterality: N/A;   ATRIAL FIBRILLATION ABLATION N/A 12/16/2023   Procedure: ATRIAL FIBRILLATION ABLATION;  Surgeon: Cindie Ole DASEN, MD;  Location: [facility];  Service: Cardiovascular;  Laterality: N/A;   BAND HEMORRHOIDECTOMY  2012   Dr Luis   BREAST BIOPSY Right 08/25/2022   US  RT BREAST BX W LOC DEV 1ST LESION IMG BX SPEC US  GUIDE 08/25/2022 [facility]   CARDIOVERSION N/A 04/08/2021   Procedure: CARDIOVERSION;  Surgeon: Mady Bruckner, MD;  Location: [facility];  Service: Cardiovascular;  Laterality:  N/A;   CESAREAN SECTION  1994   COLONOSCOPY  06/18/2011   CYSTO/  BILATERAL PYELOGRAM RETROGRADES/  TRANSOBTURATOR SLING  03/08/2006   EXCISION OF SKIN TAG N/A 02/13/2016   Procedure: ANAL SKIN TAG EXCISION;  Surgeon: Bernarda Ned, MD;  Location: [facility];  Service: General;  Laterality: N/A;   LAPAROSCOPIC CHOLECYSTECTOMY  05/22/2001   SHOULDER SURGERY      Current Medications: Current Meds  Medication Sig   acetaminophen  (TYLENOL ) 500 MG tablet Take 1,000 mg by mouth every 6 (six) hours as needed for mild pain.   albuterol  (VENTOLIN  HFA) 108 (90 Base) MCG/ACT inhaler Inhale 2 puffs into the lungs every 6 (six) hours as needed for wheezing or shortness of breath.   amiodarone  (PACERONE ) 100 MG tablet Take 1 tablet (100 mg total) by mouth daily.   Ascorbic Acid  (VITAMIN C ) 1000 MG tablet Take 1,000 mg by mouth daily.   buPROPion  (WELLBUTRIN  XL)  300 MG 24 hr tablet Take 300 mg by mouth every morning.    butalbital -acetaminophen -caffeine  (FIORICET ) 50-325-40 MG tablet Take 1 tablet by mouth 2 (two) times daily as needed for headache.   CALCIUM  PO Take 1,200 tablets by mouth daily.   carvedilol  (COREG ) 25 MG tablet TAKE TWO TABLETS BY MOUTH TWICE DAILY   Cholecalciferol  (VITAMIN D -3 PO) Take 2,000 Units by mouth daily.   dicyclomine  (BENTYL ) 10 MG capsule Take one tablet by mouth once to twice daily as needed   ELIQUIS  5 MG TABS tablet TAKE ONE TABLET BY MOUTH TWICE DAILY   escitalopram  (LEXAPRO ) 20 MG tablet Take 20 mg by mouth every morning.    estradiol  (ESTRACE ) 2 MG tablet Take 2 mg by mouth daily.   fenofibrate  micronized (LOFIBRA) 200 MG capsule Take 200 mg by mouth daily before breakfast.    fexofenadine (ALLEGRA) 180 MG tablet Take 180 mg by mouth daily.   fluticasone  (FLONASE ) 50 MCG/ACT nasal spray Place 1 spray into both nostrils daily.   hydrOXYzine  (VISTARIL ) 25 MG capsule Take 25 mg by mouth at bedtime.   ibuprofen (ADVIL) 200 MG tablet Take 600-800 mg by  mouth every 6 (six) hours as needed for moderate pain.   losartan  (COZAAR ) 50 MG tablet Take 50 mg by mouth daily.   lubiprostone  (AMITIZA ) 24 MCG capsule Take 1 capsule (24 mcg total) by mouth 2 (two) times daily with a meal. Please schedule a yearly follow up appointment for further refills. Thank you.   magnesium  oxide (MAG-OX) 400 (240 Mg) MG tablet Take 400 mg by mouth daily.   meclizine  (ANTIVERT ) 25 MG tablet Take 1 tablet (25 mg total) by mouth 3 (three) times daily as needed for dizziness.   mometasone  (ELOCON ) 0.1 % ointment Apply 1 application. topically 2 (two) times daily as needed (rash).   omega-3 acid ethyl esters (LOVAZA ) 1 g capsule Take 1 capsule (1 g total) by mouth 2 (two) times daily.   omeprazole  (PRILOSEC) 40 MG capsule Take 1 capsule (40 mg total) by mouth in the morning and at bedtime.   ondansetron  (ZOFRAN -ODT) 8 MG disintegrating tablet Take 8 mg by mouth every 8 (eight) hours as needed for nausea or vomiting.   pantoprazole  (PROTONIX ) 40 MG tablet Take 1 tablet (40 mg total) by mouth daily.   pramipexole  (MIRAPEX ) 0.75 MG tablet Take 1 tablet (0.75 mg total) by mouth at bedtime.   RABEprazole (ACIPHEX) 20 MG tablet Take 20 mg by mouth every morning.   Semaglutide , 2 MG/DOSE, (OZEMPIC , 2 MG/DOSE,) 8 MG/3ML SOPN Inject 2 mg into the skin once a week.   tacrolimus (PROTOPIC) 0.1 % ointment Apply 1 application. topically 2 (two) times daily as needed (rash).   triamcinolone ointment (KENALOG) 0.1 % Apply 1 Application topically 2 (two) times daily as needed.   triamterene -hydrochlorothiazide  (MAXZIDE -25) 37.5-25 MG tablet Take 0.5 tablets by mouth daily.   valACYclovir (VALTREX) 1000 MG tablet Take 1,000 mg by mouth 2 (two) times daily as needed (cold sores).   Vitamin D , Ergocalciferol , (DRISDOL ) 1.25 MG (50000 UNIT) CAPS capsule Take 1 capsule (50,000 Units total) by mouth every 7 (seven) days.   zolpidem  (AMBIEN ) 10 MG tablet Take 10 mg by mouth at bedtime as needed for  sleep.    Allergies:   Metformin hcl and Venlafaxine   Social History   Socioeconomic History   Marital status: Married    Spouse name: Oneil   Number of children: 2   Years of education: [employer]  Highest education level: Not on file  Occupational History   Occupation: RN  Tobacco Use   Smoking status: Never   Smokeless tobacco: Never   Tobacco comments:    Never smoke 08/26/21  Vaping Use   Vaping status: Never Used  Substance and Sexual Activity   Alcohol use: No    Alcohol/week: 0.0 standard drinks of alcohol   Drug use: No   Sexual activity: Not on file    Comment: post menopausal per pt  Other Topics Concern   Not on file  Social History Narrative   Lives in [place].  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  Occasionally drinks coffee but not a heavy caffeine  drinker.     Social Drivers of Corporate Investment Banker Strain: Not on file  Food Insecurity: Not on file  Transportation Needs: Not on file  Physical Activity: Not on file  Stress: Not on file  Social Connections: Not on file     Family History:  The patient's family history includes Atrial fibrillation in her mother; Breast cancer in an other family member; Cancer in her mother; Colon cancer in her mother; Diabetes in her father, mother, sister, and sister; Hyperlipidemia in her father; Hypertension in her father and mother; Obesity in her mother; Skin cancer in her father; Suicidality in her father. There is no history of Sleep apnea, Esophageal cancer, Rectal cancer, Stomach cancer, or Restless legs syndrome.  ROS:   12-point review of systems is negative unless otherwise noted in the HPI.   EKGs/Labs/Other Studies Reviewed:    Studies reviewed were summarized above. The additional studies were reviewed today:  ETT 09/14/2023:   Baseline ECG demonstrates normal sinus rhythm with low voltage.   The patient demonstrates decreased functional capacity, exercising for 6:00 minutes and  achieving a heart rate of 112 bpm (69% MPHR).  No angina was reported.   No significant ST segment or T wave abnormalities were observed.   No arrhythmia was seen.  There was no QRS widening or other conduction abnormality during stress or recovery.   Non-diagnostic exercise tolerance test for ischemia due to failure to achieve target heart rate.  No significant ECG changes observed at workload achieved. __________  Zio patch 05/2022: HR 70 - 197 bpm, average 82 bpm. 1 nonsustained SVT, longest 4 beats. Rare supraventricular and ventricular ectopy. No atrial fibrillation. All symptom trigger episodes correspond to sinus rhythm with PAC or PVC. __________  Coronary CTA 07/24/2021: Aorta:  Normal size.  No calcifications.  No dissection.   Aortic Valve:  Trileaflet.  No calcifications.   Coronary Arteries:  Normal coronary origin.  Right dominance.   RCA is a dominant artery that gives rise to PDA and PLA. There is no plaque.   Left main is a large artery that gives rise to LAD and LCX arteries. LM has no disease   LAD has no plaque.   LCX is a non-dominant artery that gives rise to one OM1 branch. There is no plaque.   Other findings:   Normal pulmonary vein drainage into the left atrium.   Normal left atrial appendage without a thrombus.   Normal size of the pulmonary artery.   IMPRESSION: 1. Normal coronary calcium  score of 0. Patient is low risk for coronary events. 2. Normal coronary origin with right dominance. 3. No evidence of CAD. 4. CAD-RADS 0. Consider non-atherosclerotic causes of chest pain. __________   2D echo 04/07/2021: 1. Left ventricular ejection fraction, by estimation,  is 60 to 65%. The  left ventricle has normal function. The left ventricle has no regional  wall motion abnormalities. There is mild asymmetric left ventricular  hypertrophy of the basal-septal segment.  Left ventricular diastolic parameters are indeterminate.   2. Right ventricular  systolic function is normal. The right ventricular  size is normal. Tricuspid regurgitation signal is inadequate for assessing  PA pressure.   3. The mitral valve is normal in structure. Trivial mitral valve  regurgitation. No evidence of mitral stenosis.   4. The aortic valve is normal in structure. Aortic valve regurgitation is  not visualized. No aortic stenosis is present. __________   Lexiscan  MPI 08/30/2020: Blood pressure demonstrated a normal response to exercise. The patient exercised for 5 minutes and was not able to achieve 85% maximum predicted heart rate and thus switched to Lexiscan . There was no ST segment deviation noted during stress. The study is normal. This is a low risk study. The left ventricular ejection fraction is normal (55-65%). CT attenuation images showed no evidence of aortic or coronary calcifications. __________   Zio patch 06/2020: Patient had a min HR of 53 bpm, max HR of 197 bpm, and avg HR of 83 bpm. Predominant underlying rhythm was Sinus Rhythm. 148 Supraventricular Tachycardia runs occurred, the run with the fastest interval lasting 4 beats with a max rate of 197 bpm, the  longest lasting 13 beats with an avg rate of 127 bpm. Atrial Fibrillation occurred (<1% burden), ranging from 74-166 bpm (avg of 112 bpm), the longest lasting 28 mins 17 secs with an avg rate of 113 bpm. Supraventricular Tachycardia and Atrial  Fibrillation were detected within +/- 45 seconds of symptomatic patient event(s). __________   2D echo 12/20/2019: 1. Left ventricular ejection fraction, by estimation, is 60 to 65%. The  left ventricle has normal function. The left ventricle has no regional  wall motion abnormalities. Left ventricular diastolic parameters were  normal.   2. Right ventricular systolic function is normal. The right ventricular  size is normal.   3. The mitral valve is normal in structure. Trivial mitral valve  regurgitation.   4. The aortic valve is normal  in structure. Aortic valve regurgitation is not visualized.   EKG:  EKG is ordered today.  The EKG ordered today demonstrates NSR, 70 bpm, nonspecific ST-T changes, consistent with prior tracing  Recent Labs: 09/24/2023: Magnesium  1.8 12/10/2023: TSH 2.770 12/17/2023: ALT 12; BUN 14; Creatinine, Ser 0.68; Hemoglobin 12.0; Platelets 350; Potassium 3.5; Sodium 138  Recent Lipid Panel    Component Value Date/Time   CHOL 169 09/22/2023 0928   TRIG 319 (H) 09/22/2023 0928   HDL 59 09/22/2023 0928   CHOLHDL 5.2 12/20/2019 0502   VLDL UNABLE TO CALCULATE IF TRIGLYCERIDE OVER 400 mg/dL 92/71/7978 9497   LDLCALC 61 09/22/2023 0928   LDLDIRECT 66.8 12/20/2019 0502    PHYSICAL EXAM:    VS:  BP 122/86   Pulse 78   Ht 5' 6 (1.676 m)   Wt 226 lb 9.6 oz (102.8 kg)   LMP 02/13/2015   SpO2 97%   BMI 36.57 kg/m   BMI: Body mass index is 36.57 kg/m.  Physical Exam Vitals reviewed.  Constitutional:      Appearance: She is well-developed.  HENT:     Head: Normocephalic and atraumatic.  Eyes:     General:        Right eye: No discharge.        Left eye: No discharge.  Cardiovascular:  Rate and Rhythm: Normal rate and regular rhythm.     Heart sounds: Normal heart sounds, S1 normal and S2 normal. Heart sounds not distant. No midsystolic click and no opening snap. No murmur heard.    No friction rub.  Pulmonary:     Effort: Pulmonary effort is normal. No respiratory distress.     Breath sounds: Normal breath sounds. No decreased breath sounds, wheezing, rhonchi or rales.  Musculoskeletal:     Cervical back: Normal range of motion.     Right lower leg: No edema.     Left lower leg: No edema.  Skin:    General: Skin is warm and dry.     Nails: There is no clubbing.  Neurological:     Mental Status: She is alert and oriented to person, place, and time.  Psychiatric:        Speech: Speech normal.        Behavior: Behavior normal.        Thought Content: Thought content normal.         Judgment: Judgment normal.     Wt Readings from Last 3 Encounters:  04/04/24 226 lb 9.6 oz (102.8 kg)  03/22/24 223 lb 3.2 oz (101.2 kg)  12/17/23 220 lb (99.8 kg)     ASSESSMENT & PLAN:   Persistent A-fib/flutter: Maintaining sinus rhythm.  Status post ablation in 07/2021 with recurrent A-fib/flutter status post redo ablation in 11/2023.  CHA2DS2-VASc at least 2 (HTN, sex category).  Remains on amiodarone  100 mg daily and apixaban  5 mg twice daily.  No falls or symptoms concerning for bleeding.  Most recent surveillance labs stable.  Ongoing management per EP.  Paroxysmal SVT: Quiescent.  On carvedilol  25 mg twice daily.  Management per EP.  HTN: Blood pressure is well-controlled in the office today.  We will attempt to improve her dizziness with transitioning her losartan  50 mg to the evening hours.  She will otherwise continue carvedilol  25 mg twice daily and triamterene /HCTZ 37.5/25 half tab daily.  Hypertriglyceridemia:  Coronary CTA in 07/2023 showed normal coronary arteries with coronary morphology CT in 11/2023 showing a calcium  score of 0.  LDL 61 with a triglyceride of 319 in 08/2023.  Future orders placed for fasting lipid panel and AST/ALT.  She remains on fenofibrate  200 mg daily.    Disposition: F/u with Dr. Darliss or an APP in 12 months, and EP as directed.    Medication Adjustments/Labs and Tests Ordered: Current medicines are reviewed at length with the patient today.  Concerns regarding medicines are outlined above. Medication changes, Labs and Tests ordered today are summarized above and listed in the Patient Instructions accessible in Encounters.   Signed, Bernardino Bring, PA-C 04/04/2024 4:42 PM     [facility] -  [address] [place], [place] [address] [phone]

## 2024-04-19 DIAGNOSIS — I10 Essential (primary) hypertension: Secondary | ICD-10-CM | POA: Diagnosis not present

## 2024-04-19 DIAGNOSIS — E785 Hyperlipidemia, unspecified: Secondary | ICD-10-CM | POA: Diagnosis not present

## 2024-04-19 DIAGNOSIS — Z79899 Other long term (current) drug therapy: Secondary | ICD-10-CM | POA: Diagnosis not present

## 2024-04-19 DIAGNOSIS — Z833 Family history of diabetes mellitus: Secondary | ICD-10-CM | POA: Diagnosis not present

## 2024-04-19 DIAGNOSIS — G4733 Obstructive sleep apnea (adult) (pediatric): Secondary | ICD-10-CM | POA: Diagnosis not present

## 2024-04-19 DIAGNOSIS — E66812 Obesity, class 2: Secondary | ICD-10-CM | POA: Diagnosis not present

## 2024-04-19 DIAGNOSIS — Z23 Encounter for immunization: Secondary | ICD-10-CM | POA: Diagnosis not present

## 2024-04-19 DIAGNOSIS — E781 Pure hyperglyceridemia: Secondary | ICD-10-CM | POA: Diagnosis not present

## 2024-04-20 LAB — LIPID PANEL
Chol/HDL Ratio: 2.7 ratio (ref 0.0–4.4)
Cholesterol, Total: 198 mg/dL (ref 100–199)
HDL: 73 mg/dL (ref >39)
LDL Chol Calc (NIH): 86 mg/dL (ref 0–99)
Triglycerides: 240 mg/dL — ABNORMAL HIGH (ref 0–149)
VLDL Cholesterol Cal: 39 mg/dL (ref 5–40)

## 2024-04-20 LAB — AST: AST: 15 IU/L (ref 0–40)

## 2024-04-20 LAB — ALT: ALT: 13 IU/L (ref 0–32)

## 2024-04-24 ENCOUNTER — Ambulatory Visit: Payer: Self-pay | Admitting: Physician Assistant

## 2024-06-12 NOTE — Progress Notes (Signed)
 AUSTRALIA DROLL                                          MRN: [id number]   06/12/2024   The [facility] Quality Team Specialist reviewed this patient medical record for the purposes of chart review for care gap closure. The following were reviewed: chart review for care gap closure-kidney health evaluation for diabetes:eGFR  and uACR.    [facility] Quality Team

## 2024-08-28 ENCOUNTER — Encounter (INDEPENDENT_AMBULATORY_CARE_PROVIDER_SITE_OTHER): Admitting: Ophthalmology
